# Patient Record
Sex: Female | Born: 1935 | Race: White | Hispanic: No | Marital: Married | Smoking: Never smoker
Health system: Southern US, Community
[De-identification: ages and names within clinical notes are randomized; demographics above are authoritative.]

## PROBLEM LIST (undated history)

## (undated) DIAGNOSIS — I739 Peripheral vascular disease, unspecified: Secondary | ICD-10-CM

## (undated) DIAGNOSIS — E1140 Type 2 diabetes mellitus with diabetic neuropathy, unspecified: Secondary | ICD-10-CM

## (undated) DIAGNOSIS — K219 Gastro-esophageal reflux disease without esophagitis: Secondary | ICD-10-CM

## (undated) DIAGNOSIS — I110 Hypertensive heart disease with heart failure: Secondary | ICD-10-CM

## (undated) DIAGNOSIS — E785 Hyperlipidemia, unspecified: Secondary | ICD-10-CM

## (undated) DIAGNOSIS — J189 Pneumonia, unspecified organism: Secondary | ICD-10-CM

## (undated) DIAGNOSIS — I4891 Unspecified atrial fibrillation: Secondary | ICD-10-CM

## (undated) DIAGNOSIS — Z95810 Presence of automatic (implantable) cardiac defibrillator: Secondary | ICD-10-CM

## (undated) DIAGNOSIS — J984 Other disorders of lung: Secondary | ICD-10-CM

## (undated) DIAGNOSIS — Z9289 Personal history of other medical treatment: Secondary | ICD-10-CM

## (undated) DIAGNOSIS — I472 Ventricular tachycardia: Secondary | ICD-10-CM

## (undated) DIAGNOSIS — I10 Essential (primary) hypertension: Secondary | ICD-10-CM

## (undated) DIAGNOSIS — I6521 Occlusion and stenosis of right carotid artery: Secondary | ICD-10-CM

## (undated) DIAGNOSIS — E119 Type 2 diabetes mellitus without complications: Secondary | ICD-10-CM

## (undated) DIAGNOSIS — M797 Fibromyalgia: Secondary | ICD-10-CM

## (undated) DIAGNOSIS — I701 Atherosclerosis of renal artery: Secondary | ICD-10-CM

## (undated) DIAGNOSIS — I447 Left bundle-branch block, unspecified: Secondary | ICD-10-CM

## (undated) DIAGNOSIS — I6529 Occlusion and stenosis of unspecified carotid artery: Secondary | ICD-10-CM

## (undated) DIAGNOSIS — N184 Chronic kidney disease, stage 4 (severe): Secondary | ICD-10-CM

## (undated) DIAGNOSIS — E237 Disorder of pituitary gland, unspecified: Secondary | ICD-10-CM

## (undated) DIAGNOSIS — I4819 Other persistent atrial fibrillation: Secondary | ICD-10-CM

## (undated) DIAGNOSIS — I509 Heart failure, unspecified: Secondary | ICD-10-CM

## (undated) DIAGNOSIS — F411 Generalized anxiety disorder: Secondary | ICD-10-CM

## (undated) DIAGNOSIS — E039 Hypothyroidism, unspecified: Secondary | ICD-10-CM

## (undated) DIAGNOSIS — Z973 Presence of spectacles and contact lenses: Secondary | ICD-10-CM

## (undated) DIAGNOSIS — G2581 Restless legs syndrome: Secondary | ICD-10-CM

## (undated) DIAGNOSIS — Z972 Presence of dental prosthetic device (complete) (partial): Secondary | ICD-10-CM

## (undated) DIAGNOSIS — M1990 Unspecified osteoarthritis, unspecified site: Secondary | ICD-10-CM

## (undated) DIAGNOSIS — I482 Chronic atrial fibrillation, unspecified: Secondary | ICD-10-CM

## (undated) DIAGNOSIS — N289 Disorder of kidney and ureter, unspecified: Secondary | ICD-10-CM

## (undated) DIAGNOSIS — K08109 Complete loss of teeth, unspecified cause, unspecified class: Secondary | ICD-10-CM

## (undated) DIAGNOSIS — D649 Anemia, unspecified: Secondary | ICD-10-CM

## (undated) DIAGNOSIS — E782 Mixed hyperlipidemia: Secondary | ICD-10-CM

## (undated) DIAGNOSIS — T462X5A Adverse effect of other antidysrhythmic drugs, initial encounter: Secondary | ICD-10-CM

## (undated) HISTORY — DX: Ventricular tachycardia: I47.2

## (undated) HISTORY — DX: Occlusion and stenosis of right carotid artery: I65.21

## (undated) HISTORY — DX: Chronic kidney disease, stage 4 (severe): N18.4

## (undated) HISTORY — PX: CATARACT EXTRACTION W/ INTRAOCULAR LENS  IMPLANT, BILATERAL: SHX1307

## (undated) HISTORY — PX: APPENDECTOMY: SHX54

## (undated) HISTORY — DX: Unspecified atrial fibrillation: I48.91

## (undated) HISTORY — PX: COLONOSCOPY W/ BIOPSIES AND POLYPECTOMY: SHX1376

## (undated) HISTORY — PX: ABDOMINAL HYSTERECTOMY: SHX81

## (undated) HISTORY — PX: OTHER SURGICAL HISTORY: SHX169

## (undated) HISTORY — DX: Generalized anxiety disorder: F41.1

## (undated) HISTORY — DX: Type 2 diabetes mellitus without complications: E11.9

## (undated) HISTORY — DX: Disorder of pituitary gland, unspecified: E23.7

## (undated) HISTORY — DX: Disorder of kidney and ureter, unspecified: N28.9

## (undated) HISTORY — DX: Occlusion and stenosis of unspecified carotid artery: I65.29

## (undated) HISTORY — DX: Chronic atrial fibrillation, unspecified: I48.20

## (undated) HISTORY — DX: Hyperlipidemia, unspecified: E78.5

## (undated) HISTORY — DX: Type 2 diabetes mellitus with diabetic neuropathy, unspecified: E11.40

## (undated) HISTORY — DX: Heart failure, unspecified: I50.9

## (undated) HISTORY — DX: Personal history of other medical treatment: Z92.89

## (undated) HISTORY — DX: Atherosclerosis of renal artery: I70.1

## (undated) HISTORY — DX: Peripheral vascular disease, unspecified: I73.9

## (undated) HISTORY — PX: CHOLECYSTECTOMY: SHX55

## (undated) HISTORY — DX: Adverse effect of other antidysrhythmic drugs, initial encounter: T46.2X5A

## (undated) HISTORY — DX: Left bundle-branch block, unspecified: I44.7

## (undated) HISTORY — PX: DILATION AND CURETTAGE OF UTERUS: SHX78

## (undated) HISTORY — DX: Other persistent atrial fibrillation: I48.19

## (undated) HISTORY — DX: Anemia, unspecified: D64.9

## (undated) HISTORY — DX: Other disorders of lung: J98.4

## (undated) HISTORY — DX: Mixed hyperlipidemia: E78.2

## (undated) HISTORY — DX: Presence of automatic (implantable) cardiac defibrillator: Z95.810

## (undated) HISTORY — DX: Hypertensive heart disease with heart failure: I11.0

## (undated) HISTORY — PX: BREAST SURGERY: SHX581

---

## 1992-10-09 HISTORY — PX: OTHER SURGICAL HISTORY: SHX169

## 2002-07-06 ENCOUNTER — Encounter: Payer: Self-pay | Admitting: *Deleted

## 2002-07-06 ENCOUNTER — Ambulatory Visit (HOSPITAL_COMMUNITY): Admission: RE | Admit: 2002-07-06 | Discharge: 2002-07-06 | Payer: Self-pay | Admitting: *Deleted

## 2005-07-21 ENCOUNTER — Ambulatory Visit (HOSPITAL_COMMUNITY): Admission: RE | Admit: 2005-07-21 | Discharge: 2005-07-21 | Payer: Self-pay | Admitting: Cardiovascular Disease

## 2005-07-21 HISTORY — PX: CARDIOVERSION: SHX1299

## 2006-04-17 ENCOUNTER — Encounter: Admission: RE | Admit: 2006-04-17 | Discharge: 2006-04-17 | Payer: Self-pay | Admitting: Cardiovascular Disease

## 2006-04-18 ENCOUNTER — Ambulatory Visit (HOSPITAL_COMMUNITY): Admission: RE | Admit: 2006-04-18 | Discharge: 2006-04-18 | Payer: Self-pay | Admitting: Cardiovascular Disease

## 2006-05-21 ENCOUNTER — Ambulatory Visit: Payer: Self-pay | Admitting: Internal Medicine

## 2006-05-29 ENCOUNTER — Inpatient Hospital Stay (HOSPITAL_COMMUNITY): Admission: AD | Admit: 2006-05-29 | Discharge: 2006-06-02 | Payer: Self-pay | Admitting: Internal Medicine

## 2006-05-29 ENCOUNTER — Ambulatory Visit: Payer: Self-pay | Admitting: Internal Medicine

## 2006-06-07 ENCOUNTER — Inpatient Hospital Stay (HOSPITAL_COMMUNITY): Admission: RE | Admit: 2006-06-07 | Discharge: 2006-06-09 | Payer: Self-pay | Admitting: Internal Medicine

## 2006-06-07 ENCOUNTER — Ambulatory Visit: Payer: Self-pay | Admitting: Internal Medicine

## 2006-06-08 HISTORY — PX: AV NODE ABLATION: SHX1209

## 2006-06-13 ENCOUNTER — Encounter: Payer: Self-pay | Admitting: Cardiology

## 2006-06-13 ENCOUNTER — Inpatient Hospital Stay (HOSPITAL_COMMUNITY): Admission: AD | Admit: 2006-06-13 | Discharge: 2006-06-18 | Payer: Self-pay | Admitting: Internal Medicine

## 2006-06-13 ENCOUNTER — Ambulatory Visit: Payer: Self-pay

## 2006-06-13 ENCOUNTER — Ambulatory Visit: Payer: Self-pay | Admitting: Internal Medicine

## 2006-06-27 ENCOUNTER — Ambulatory Visit: Payer: Self-pay | Admitting: Internal Medicine

## 2006-08-03 ENCOUNTER — Ambulatory Visit: Payer: Self-pay | Admitting: Internal Medicine

## 2006-09-19 ENCOUNTER — Ambulatory Visit: Payer: Self-pay | Admitting: Internal Medicine

## 2007-01-10 ENCOUNTER — Ambulatory Visit: Payer: Self-pay | Admitting: Internal Medicine

## 2009-01-18 ENCOUNTER — Encounter (INDEPENDENT_AMBULATORY_CARE_PROVIDER_SITE_OTHER): Payer: Self-pay

## 2009-05-31 ENCOUNTER — Encounter: Payer: Self-pay | Admitting: Internal Medicine

## 2009-10-09 HISTORY — PX: BRAIN MENINGIOMA EXCISION: SHX576

## 2010-06-09 DIAGNOSIS — I472 Ventricular tachycardia, unspecified: Secondary | ICD-10-CM

## 2010-06-09 HISTORY — DX: Ventricular tachycardia: I47.2

## 2010-06-09 HISTORY — DX: Ventricular tachycardia, unspecified: I47.20

## 2010-07-05 DIAGNOSIS — Z9289 Personal history of other medical treatment: Secondary | ICD-10-CM

## 2010-07-05 HISTORY — DX: Personal history of other medical treatment: Z92.89

## 2010-08-04 DIAGNOSIS — Z9289 Personal history of other medical treatment: Secondary | ICD-10-CM | POA: Insufficient documentation

## 2010-08-04 HISTORY — DX: Personal history of other medical treatment: Z92.89

## 2010-11-10 NOTE — Miscellaneous (Signed)
Summary: Device Preload  Clinical Lists Changes  Observations: Added new observation of PPM INDICATN: CHF (01/18/2009 16:39) Added new observation of PPMLEADSTAT2: active (01/18/2009 16:39) Added new observation of PPMLEADSER2: [id number] (01/18/2009 16:39) Added new observation of PPMLEADMOD2: [id number]  (01/18/2009 16:39) Added new observation of PPMLEADDOI2: 06/08/2006  (01/18/2009 16:39) Added new observation of PPMLEADLOC2: LV  (01/18/2009 16:39) Added new observation of PPMLEADSTAT1: active  (01/18/2009 16:39) Added new observation of PPMLEADSER1: [id number]  (01/18/2009 16:39) Added new observation of PPMLEADMOD1: [id number]  (01/18/2009 16:39) Added new observation of PPMLEADDOI1: 06/08/2006  (01/18/2009 16:39) Added new observation of PPMLEADLOC1: RV  (01/18/2009 16:39) Added new observation of PPM IMP MD: Cristopher Peru, MD  (01/18/2009 16:39) Added new observation of PPM DOI: 06/08/2006  (01/18/2009 16:39) Added new observation of PPM SERL#: [id number]  (01/18/2009 16:39) Added new observation of PPM MODL#: [id number]  (01/18/2009 16:39) Added new observation of PACEMAKERMFG: Medtronic  (01/18/2009 16:39) Added new observation of PACEMAKER MD: Cristopher Peru, MD  (01/18/2009 16:39)      PPM Specifications Following MD:  Cristopher Peru, MD     PPM Vendor:  Medtronic     PPM Model Number:  [id number]     PPM Serial Number:  [id number] PPM DOI:  06/08/2006     PPM Implanting MD:  Cristopher Peru, MD  Lead 1    Location: RV     DOI: 06/08/2006     Model #: [id number]     Serial #: [id number]     Status: active Lead 2    Location: LV     DOI: 06/08/2006     Model #: [id number]     Serial #: [id number]     Status: active   Indications:  CHF

## 2010-11-15 ENCOUNTER — Ambulatory Visit (HOSPITAL_COMMUNITY)
Admission: RE | Admit: 2010-11-15 | Discharge: 2010-11-15 | Disposition: A | Discharge status: Home / Self Care | Payer: MEDICARE | Source: Ambulatory Visit | Attending: Cardiovascular Disease | Admitting: Cardiovascular Disease

## 2010-11-15 ENCOUNTER — Ambulatory Visit (HOSPITAL_COMMUNITY): Payer: MEDICARE

## 2010-11-15 DIAGNOSIS — Z01812 Encounter for preprocedural laboratory examination: Secondary | ICD-10-CM | POA: Insufficient documentation

## 2010-11-15 DIAGNOSIS — Z4502 Encounter for adjustment and management of automatic implantable cardiac defibrillator: Secondary | ICD-10-CM | POA: Insufficient documentation

## 2010-11-15 DIAGNOSIS — Z01818 Encounter for other preprocedural examination: Secondary | ICD-10-CM | POA: Insufficient documentation

## 2010-11-15 HISTORY — PX: BIV ICD GENERTAOR CHANGE OUT: SHX5745

## 2010-11-15 LAB — GLUCOSE, CAPILLARY
Glucose-Capillary: 148 mg/dL — ABNORMAL HIGH (ref 70–99)
Glucose-Capillary: 120 mg/dL — ABNORMAL HIGH (ref 70–99)

## 2010-11-15 LAB — BASIC METABOLIC PANEL
BUN: 29 mg/dL — ABNORMAL HIGH (ref 6–23)
CO2: 28 mEq/L (ref 19–32)
Calcium: 9.8 mg/dL (ref 8.4–10.5)
Chloride: 103 mEq/L (ref 96–112)
Creatinine, Ser: 1.15 mg/dL (ref 0.4–1.2)
GFR calc Af Amer: 56 mL/min — ABNORMAL LOW (ref >60)
GFR calc non Af Amer: 46 mL/min — ABNORMAL LOW (ref >60)
Glucose, Bld: 139 mg/dL — ABNORMAL HIGH (ref 70–99)
Potassium: 4.1 mEq/L (ref 3.5–5.1)
Sodium: 142 mEq/L (ref 135–145)

## 2010-11-15 LAB — CBC
HCT: 35.6 % — ABNORMAL LOW (ref 36.0–46.0)
Hemoglobin: 11.7 g/dL — ABNORMAL LOW (ref 12.0–15.0)
MCH: 26.4 pg (ref 26.0–34.0)
MCHC: 32.9 g/dL (ref 30.0–36.0)
MCV: 80.2 fL (ref 78.0–100.0)
Platelets: 284 10*3/uL (ref 150–400)
RBC: 4.44 MIL/uL (ref 3.87–5.11)
RDW: 16.9 % — ABNORMAL HIGH (ref 11.5–15.5)
WBC: 7.7 10*3/uL (ref 4.0–10.5)

## 2010-11-15 LAB — SURGICAL PCR SCREEN
MRSA, PCR: NEGATIVE
Staphylococcus aureus: NEGATIVE

## 2010-11-15 LAB — PROTIME-INR
INR: 1.28 (ref 0.00–1.49)
Prothrombin Time: 16.2 seconds — ABNORMAL HIGH (ref 11.6–15.2)

## 2010-11-15 LAB — APTT: aPTT: 26 seconds (ref 24–37)

## 2010-11-24 NOTE — Op Note (Signed)
[name], [name]             ACCOUNT NO.:  [id number]  MEDICAL RECORD NO.:  [id number]           PATIENT TYPE:  O  LOCATION:  [facility]                         FACILITY:  [facility]  PHYSICIAN:  [name], MD     DATE OF BIRTH:  1934/01/02  DATE OF PROCEDURE: DATE OF DISCHARGE:                              OPERATIVE REPORT   PROCEDURE PERFORMED:  Biventricular defibrillator generator change out.  REASON FOR THE PROCEDURE:  Device at elective replacement interval.  PROCEDURE PERFORMED BY:  [name], MD.  MEDICATIONS ADMINISTERED:  Vancomycin 1 g intravenously, Versed 3 mg intravenously, fentanyl 50 mcg intravenously, and lidocaine 1% 30 mL locally.  ESTIMATED BLOOD LOSS:  Less than 5 mL.  COMPLICATIONS:  None.  PROCEDURE IN DETAIL:  After risks and benefits of the procedure were described, the patient provided informed consent.  She was brought to the cardiac cath lab in a fasting state, and the left shoulder area was prepped and draped in the usual sterile fashion.  Local anesthesia of 1% lidocaine was administered to the prepectoral infraclavicular area, and a 7-cm horizontal incision was made parallel to the inferior border of the clavicle in the area of the previous surgical scar.  Using limited electrocautery and careful blunt dissection, the device pocket was exposed.  Great care was taken to avoid injury to the chronic leads.  The chronic device was explanted without difficulty.  The leads were individually tested, and electronic parameters were found to be within desirable range and unchanged from preprocedural findings.  The pocket was flushed with copious amounts of antibiotic solution and inspected for hemostasis, which was found to be excellent.  The new generator was then attached to the chronic leads, and appropriate biventricular pacing was noted.  The generator was placed in the pocket with great care being taken to ensure that the leads were located  deep to the generator.  A single silk suture was placed to secure the device and prevent migration.  The pocket was then closed in layers using two layers of 2-0 Vicryl and one layer of 4-0 Vicryl.  Steri-Strips were applied, and then a sterile pressure dressing was placed.  No complications occurred.  The following data is available for the chronic leads:  The right atrial lead is a Medtronic 5076/53 cm, serial number [id number], inserted on June 17, 2007.  Right ventricular lead is a [employer] 7121/65 cm, serial number [id number], inserted on June 17, 2007.  Left ventricular lead is a Medtronic 4194/88 cm, serial number [id number], inserted on June 17, 2007.  The explanted device is a Medtronic Concerta 0000000.  The new device is a Medtronic Protecta [id number], serial number [id number].  The following lead parameters were encountered via the device at the end of the procedure:  Right atrial sensed P-wave 0.5 mV (patient in atrial fibrillation), impedance 456 ohms.  Capture could not be tested.  Right ventricular lead sensed R-wave 7.9 mV, impedance 494 ohms, threshold 1.2 volts at 0.4 milliseconds pulse width.  Left ventricular lead, impedance 513 ohms, threshold 2.5 volts at 1.0 milliseconds pulse width.  The high-voltage impedance is 47 ohms (57  ohms for the SVC coil).  The device was programmed identical to the previous device with a VF zone at 194 beats per minute and a VT monitor zone at 150 beats per minute.  The radioprogramming is VVIR with a lower rate limit of 17 and upper sensor threshold of 120 beats per minute.     [name], MD     MC/MEDQ  D:  11/15/2010  T:  11/16/2010  Job:  [id number]  cc:   Delfino Lovett A. Rollene Fare, M.D.  Electronically Signed by [name] M.D. on 11/24/2010 01:10:43 PM

## 2010-12-23 DIAGNOSIS — I459 Conduction disorder, unspecified: Secondary | ICD-10-CM

## 2010-12-23 DIAGNOSIS — Z95810 Presence of automatic (implantable) cardiac defibrillator: Secondary | ICD-10-CM

## 2010-12-23 DIAGNOSIS — I429 Cardiomyopathy, unspecified: Secondary | ICD-10-CM

## 2010-12-23 DIAGNOSIS — I442 Atrioventricular block, complete: Secondary | ICD-10-CM | POA: Insufficient documentation

## 2010-12-23 HISTORY — DX: Presence of automatic (implantable) cardiac defibrillator: Z95.810

## 2010-12-23 HISTORY — DX: Cardiomyopathy, unspecified: I42.9

## 2010-12-23 HISTORY — DX: Conduction disorder, unspecified: I45.9

## 2010-12-23 HISTORY — DX: Atrioventricular block, complete: I44.2

## 2010-12-26 ENCOUNTER — Encounter: Payer: Self-pay | Admitting: Internal Medicine

## 2010-12-26 ENCOUNTER — Institutional Professional Consult (permissible substitution) (INDEPENDENT_AMBULATORY_CARE_PROVIDER_SITE_OTHER): Payer: MEDICARE | Admitting: Internal Medicine

## 2010-12-26 DIAGNOSIS — I5022 Chronic systolic (congestive) heart failure: Secondary | ICD-10-CM

## 2010-12-26 DIAGNOSIS — I4891 Unspecified atrial fibrillation: Secondary | ICD-10-CM | POA: Insufficient documentation

## 2010-12-26 DIAGNOSIS — Z95810 Presence of automatic (implantable) cardiac defibrillator: Secondary | ICD-10-CM

## 2010-12-26 DIAGNOSIS — I4901 Ventricular fibrillation: Secondary | ICD-10-CM

## 2010-12-26 DIAGNOSIS — I469 Cardiac arrest, cause unspecified: Secondary | ICD-10-CM

## 2010-12-26 HISTORY — DX: Chronic systolic (congestive) heart failure: I50.22

## 2010-12-26 HISTORY — DX: Ventricular fibrillation: I49.01

## 2010-12-26 HISTORY — DX: Presence of automatic (implantable) cardiac defibrillator: Z95.810

## 2011-01-02 ENCOUNTER — Encounter: Payer: Self-pay | Admitting: Internal Medicine

## 2011-01-05 NOTE — Assessment & Plan Note (Signed)
Summary: [name]/polymorphic vt/has biv icd/seh&v [id number] medicare pt...   CC:  new patient/pt states Metoprolol 50 makes her nauseated and has not taken today.  History of Present Illness: [name] returns today for eval of PMVT/VF in the setting of CHF.  The patient has had a long h/o atrial fib which is chronic. She failed multiple medications in the past for atrial fib including amiodarone, sotalol and tikosyn.  She underwent AV node ablation and BiV PPM implant and had normalization of LV function and improvement from class 3 to class 1 with regard to her CHF. After her Biv implant, she had occaisinal diaphragmatic stimulation which she tolerated but then apparently had an increase of her LV threshold and had her lead revised and her system changed to a BiV ICD. She had this device changed out several weeks ago by Dr. Orene Desanctis. She was in her usual state of health when she had a brief syncopal episode on March 3 and on device reinterogation was found to have had several episodes of PMVT/VF of which all but one self terminated. She did not feel an ICD shock. She had a stress test by Dr. Rollene Fare several months ago which demonstrated no ischemia or scar and normalization of her LV function. Prior echo demonstrated an EF of 45%. She denies anginal symptoms or CHF symptoms. No change in her diet or medications and she is on 25 mg of metoprolol twice daily. Attempts to uptitrate this resulted in nausea.  Current Medications (verified): 1)  Digoxin 0.125 Mg Tabs (Digoxin) .... Take 1/2 Tablet Once Daily 2)  Losartan Potassium 50 Mg Tabs (Losartan Potassium) .... Once Daily 3)  Zetia 10 Mg Tabs (Ezetimibe) .... Take One Tablet By Mouth Daily. 4)  Metoprolol Tartrate 50 Mg Tabs (Metoprolol Tartrate) .... Take One Tablet By Mouth Twice A Day 5)  Levothyroxine Sodium 50 Mcg Tabs (Levothyroxine Sodium) .... Once Daily 6)  Furosemide 40 Mg Tabs (Furosemide) .... Take One Tablet By Mouth Daily. 7)   Jantoven 5 Mg Tabs (Warfarin Sodium) .... As Needed 8)  Hemocyte 324 Mg Tabs (Ferrous Fumarate) .... Once Daily 9)  Pantoprazole Sodium 40 Mg Tbec (Pantoprazole Sodium) .... Once Daily 10)  Aspirin 81 Mg Tbec (Aspirin) .... Take One Tablet By Mouth Daily 11)  Tramadol Hcl 50 Mg Tabs (Tramadol Hcl) .... As Needed 12)  Percocet 5-325 Mg Tabs (Oxycodone-Acetaminophen) .... As Needed 13)  Hydroxyzine Hcl 25 Mg Tabs (Hydroxyzine Hcl) .... As Needed 14)  Metformin Hcl 1000 Mg Tabs (Metformin Hcl) .... Bid 15)  Glipizide 5 Mg Tabs (Glipizide) .... Two Times A Day 16)  Levemir 100 Unit/ml Soln (Insulin Detemir) .... Uad 17)  Promethazine Hcl 25 Mg Tabs (Promethazine Hcl) .... As Needed  Allergies (verified): 1)  ! * Pcn 2)  ! * Lisinopril 3)  ! Morphine  Past History:  Family History: Last updated: 12/26/2010 No premature CAD  Social History: Last updated: 12/26/2010 Denies tobacco or ETOH  Past Medical History: Current Problems:  PACEMAKER, PERMANENT (ICD-V45.01) ATRIOVENTRICULAR BLOCK, 3RD DEGREE (ICD-426.0) AV BLOCK (ICD-426.9) CARDIOMYOPATHY (ICD-425.4)  s/p Cholecystectomy with multiple stents  Past Surgical History: s/p PPM and ICD  Family History: No premature CAD  Social History: Denies tobacco or ETOH  Review of Systems       All systems reviewed and negative except as noted in the HPI.   Vital Signs:  Patient profile:   77 year old female Height:      68 inches Weight:  197 pounds BMI:     30.06 Pulse rate:   59 / minute Pulse rhythm:   regular BP sitting:   158 / 80  (left arm) Cuff size:   large  Vitals Entered By: Doug Sou CMA (December 26, 2010 12:32 PM)  Physical Exam  General:  Well developed, well nourished, in no acute distress.  HEENT: normal Neck: supple. No JVD. Carotids 2+ bilaterally no bruits Cor: RRR no rubs, gallops or murmur Lungs: CTA. Well healed ICD incision. Ab: soft, nontender. nondistended. No HSM. Good bowel  sounds Ext: warm. no cyanosis, clubbing or edema Neuro: alert and oriented. Grossly nonfocal. affect pleasant    EKG  Procedure date:  12/26/2010  Findings:      Atrial fibrillation with a controlled ventricular response rate of: 73   PPM Specifications Following MD:  Cristopher Peru, MD     PPM Vendor:  Medtronic     PPM Model Number:  [id number]     PPM Serial Number:  [id number] PPM DOI:  06/08/2006     PPM Implanting MD:  Cristopher Peru, MD  Lead 1    Location: RV     DOI: 06/08/2006     Model #: [id number]     Serial #: [id number]     Status: active Lead 2    Location: LV     DOI: 06/08/2006     Model #: [id number]     Serial #[id number]     Status: active   Indications:  CHF   Impression & Recommendations:  Problem # 1:  AUTOMATIC IMPLANTABLE CARDIAC DEFIBRILLATOR SITU (ICD-V45.02) Assessment New Her device is working normally. Will recheck in several months.  Problem # 2:  VENTRICULAR FIBRILLATION (ICD-427.41) This is a  new problem and resulted in one ICD shock despite her relatively preserved LV function. I have reivewed her prior meds and she is not tolerant to anti-arrhythmic medical therapy and I have recommended that we continue her beta blocker for now. If she has recurrent episodes than I would suggest a left heart cath. Her updated medication list for this problem includes:    Metoprolol Tartrate 50 Mg Tabs (Metoprolol tartrate) .Marland Kitchen... Take one tablet by mouth twice a day    Jantoven 5 Mg Tabs (Warfarin sodium) .Marland Kitchen... As needed    Aspirin 81 Mg Tbec (Aspirin) .Marland Kitchen... Take one tablet by mouth daily  Problem # 3:  CHRONIC DIASTOLIC HEART FAILURE (0000000) Her symptoms appear to be well controlled. Wil continue her current meds and a low sodium diet. Her updated medication list for this problem includes:    Digoxin 0.125 Mg Tabs (Digoxin) .Marland Kitchen... Take 1/2 tablet once daily    Losartan Potassium 50 Mg Tabs (Losartan potassium) ..... Once daily    Metoprolol Tartrate 50 Mg Tabs  (Metoprolol tartrate) .Marland Kitchen... Take one tablet by mouth twice a day    Furosemide 40 Mg Tabs (Furosemide) .Marland Kitchen... Take one tablet by mouth daily.    Jantoven 5 Mg Tabs (Warfarin sodium) .Marland Kitchen... As needed    Aspirin 81 Mg Tbec (Aspirin) .Marland Kitchen... Take one tablet by mouth daily  Patient Instructions: 1)  Your physician wants you to follow-up in:4 months with Dr Knox Saliva will receive a reminder letter in the mail two months in advance. If you don't receive a letter, please call our office to schedule the follow-up appointment. 2)  Your physician recommends that you continue on your current medications as directed. Please refer to the Current Medication list given  to you today.

## 2011-02-21 NOTE — Letter (Signed)
January 10, 2007    Richard A. Rollene Fare, M.D.  [phone] [address]. [address], [place], [place] [address]   RE:  ROSETTER, LOCH  MRN:  [id number]  /  DOB:  1934-08-20   Dear Denice Paradise:   Today, we saw Pamala Hurry back for EP followup.  As you know, she is a very  pleasant lady with a history of chronic atrial fibrillation and a  tachycardia-induced cardiomyopathy, who is status post AV node ablation  and BiV pacemaker insertion.  She has done very well with this and her  diaphragmatic stimulation, which was initially quite a problem, has  improved markedly.  Her heart failure symptoms are nearly abated.  Today, we interrogated her device in our office, and her thresholds have  been stable with an LV threshold of 2.45 with her output set at 2.9.  Her impedances were stable as well.  Her underlying low rate is set at  70.  Her rate response is left on.  Overall, the patient is stable and I  have recommended that she follow up with me only on an as needed basis.  Thanks for allowing me to help with Ms. Turrell.  Please do not hesitate  to contact me for additional issues with regard to her care.    Sincerely,      Champ Mungo. Lovena Le, MD  Electronically Signed    GWT/MedQ  DD: 01/10/2007  DT: 01/10/2007  Job #: [id number]

## 2011-02-21 NOTE — Assessment & Plan Note (Signed)
[facility] OFFICE NOTE   Sandy, Hunt                    MRN:          [id number]  DATE:01/10/2007                            DOB:          1934/02/29    [name] returns today for followup.  She is a very pleasant elderly  women with non-ischemic cardiomyopathy, congestive heart failure,  chronic atrial fibrillation, status post AV node ablation and bi-v  pacemaker insertion.  Since her initial procedures, her heart failure  has improved dramatically, and she is now approaching class 1.  The  patient denies chest pain.  She can do essentially whatever she wants.  Previously, she had had trouble with diaphragmatic stimulation only when  she moved to her left side.  She has less of this now, though still  feels it occasionally.   EXAMINATION:  GENERAL:  She is a pleasant, well-appearing woman in no  distress.  VITAL SIGNS:  The blood pressure was 112/74.  The pulse was 80 and  regular.  Respirations were 18.  NECK:  Revealed no jugular venous distention.  LUNGS:  Clear bilaterally to auscultation.  CARDIOVASCULAR:  Revealed a regular rate and rhythm with normal S1 and  S2.  LUNGS:  Clear bilaterally to auscultation.  No wheezes, rales or  rhonchi.  EXTREMITIES:  Demonstrated no edema.   EKG demonstrates atrial fibrillation with ventricular pacing.   Interrogation of her pacemaker demonstrates a Medtronic Insync with R-  waves of greater than 22, impedence 646 in the RV, 804 in the LV,  threshold 0.5 and 0.4 in the RV, 2 at 0.45 in the LV or 1-1/2 at 0.9 in  the LV.  Her output today remained programmed at 2 at 0.9.  This was LV  tip to RV ring.  She was 99% V paced.   IMPRESSION:  1. Non-ischemic cardiomyopathy with initial EF after pacemaker      insertion of 20%; however, her heart failure is markedly improved,      and I expect her EF  will be improved.  The patient states to me  that she has been scheduled for a 2D echo with Dr. Rollene Fare in the      next several weeks.  2. Complete heart block secondary to AV node ablation.   DISCUSSION:  Overall, [name] is stable.  Because of this, I have  recommended that she continue to follow up with Dr. Rollene Fare, who is  her primary cardiologist.  I will be available to see the patient on an  as-needed basis.    Champ Mungo. Lovena Le, MD  Electronically Signed   GWT/MedQ  DD: 01/10/2007  DT: 01/10/2007  Job #: [id number]   cc:   Delfino Lovett A. Rollene Fare, M.D.

## 2011-02-24 NOTE — Letter (Signed)
May 21, 2006     Richard A. Rollene Fare, M.D.  [phone] [address]. [address] [place], [place]  [address]   RE:  [name], [name]  MRN:  [id number]  /  DOB:  1933/10/18   Dear Denice Paradise:   Thank you for referring Ms. Jeanene Erb for EP evaluation.  As you know  she is a very pleasant 77 year old woman with a history of tachy-brady  syndrome and chronic left bundle branch block who has recently developed  worsening atrial fibrillation and with rapid ventricular response.  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  For all of the above the amiodarone was discontinued.  She  returns today for followup.  The patient states that when she walks across  the room she immediately gets dyspneic and has to stop and rest.  Her  symptoms are fairly severe.  She denies chest pain.  Her exam is notable for  findings consistent with congestive heart failure in that she has mild  rales, a large PMI which is latter displaced, and a split S2.  Her  extremities demonstrated very little in the way of edema.  Her EKG  demonstrates atrial fibrillation with left bundle branch block and a QRS  duration of 164 msec.  Her TT interval is somewhat prolonged, but this is in  the setting of left bundle branch block.  By report her kidney function is  normal.   I have discussed treatment options with the patient.  These would include  checking her amiodarone level and if it is low enough, initiation of Tikosyn  in the hospital.  The second option would be AV node ablation and a bivalve  ICD implantation.  The third option would be primary atrial fibrillation  ablation.  I suppose there is a correlary  holding off on AV node ablation  and just put in  a bivalve ICD to up titration of her rate control and drugs  with Coreg would be an option to #2.  With all of the above, I recommended  that we try initially to start her on Tikosyn and will plan to do this if  her amiodarone level makes Tikosyn therapy reasonable.  This will be set up  in the next several weeks.  I will keep you apprised of how Ms. Purdy  does.  Also may I note that she has fairly significant sinus node  dysfunction and with her LV dysfunction and sinus node dysfunction even if  we can maintain her in sinus rhythm, she may well require backup pacing in  the setting of LV dysfunction and left bundle branch block and require a  defibrillator.  Thanks again for referring Ms. Steffanie Dunn for EP evaluation.    Sincerely,      Champ Mungo. Lovena Le, MD   GWT/MedQ  DD:  05/21/2006  DT:  05/21/2006  Job #:  [id number]   CC:    Dewitt Hoes

## 2011-02-24 NOTE — Op Note (Signed)
NAMEDOLL, MISE             ACCOUNT NO.:  [id number]   MEDICAL RECORD NO.:  [id number]          PATIENT TYPE:  INP   LOCATION:  3705                         FACILITY:  [facility]   PHYSICIAN:  Champ Mungo. Lovena Le, MD    DATE OF BIRTH:  1934-08-23   DATE OF PROCEDURE:  06/08/2006  DATE OF DISCHARGE:                                 OPERATIVE REPORT   PROCEDURE PERFORMED:  AV node ablation.   INDICATION:  Symptomatic atrial fibrillation with an uncontrolled rapid  ventricular response resulting in heart failure.   INTRODUCTION:  The patient is a 77 year old woman with a history of multiple  medical problems, who has permanent atrial fibrillation which could not be  cardioverted successfully.  She has had a rapid ventricular response despite  AV nodal blocking drugs and is now referred for AV node ablation after  undergoing biventricular pacemaker insertion the day before.   PROCEDURE:  After informed consent was obtained, the patient was taken to  the diagnostic EP lab in a fasting state.  After the usual preparation and  draping, intravenous fentanyl and midazolam were given for sedation.  A 7-  French quadripolar ablation catheter was inserted percutaneously into the  right femoral vein and advanced to the His bundle region.  Mapping was  carried out.  Four RF energy applications were delivered to this region,  taking care not to entangle the previously-placed LV and RV pacing leads.  Complete heart block was also created with a ventricular escape at 42 beats  per minute.  The patient was observed for 15 minutes and had no recurrent AV  node conduction, and the catheter was removed.  Hemostasis was assured and  she was returned to her room in satisfactory condition.  The patient's  pacemaker was reprogrammed to a lower rate of 90 beats per minute, which  will be done transiently and gradually decreased over time.  She tolerated  the procedure well.   COMPLICATIONS:  There were no  immediate procedure complications.   RESULTS:  This demonstrates successful AV node ablation in a patient with a  prior biventricular biopsy pacemaker.           ______________________________  Champ Mungo. Lovena Le, MD     GWT/MEDQ  D:  06/08/2006  T:  06/08/2006  Job:  [id number]   cc:   Delfino Lovett A. Rollene Fare, M.D.

## 2011-02-24 NOTE — Discharge Summary (Signed)
[name], [name]             ACCOUNT NO.:  [id number]   MEDICAL RECORD NO.:  [id number]          PATIENT TYPE:  INP   LOCATION:  [id number]                         FACILITY:  [place]   PHYSICIAN:  Deboraha Sprang, MD, FACCDATE OF BIRTH:  [birth year]   DATE OF ADMISSION:  06/13/2006  DATE OF DISCHARGE:  06/18/2006                                 DISCHARGE SUMMARY   She has allergy to PENICILLIN.   The patient is discharging with intermittent diaphragmatic stimulation. She  will call our office at [facility] if this becomes more pronounced  or bothersome.   PRINCIPAL DIAGNOSES:  1. Admitted September 5 in class IV decompensated congestive heart      failure.  (A)  Admission weight 192, discharge weight 191.8.  (B)  Congestive heart failure exacerbated by atrial fibrillation and rapid  ventricular rate.  (C)  Medical therapy adjusted with addition of Coreg and discontinuing  Toprol.  1. Atrial fibrillation, permanent/Coumadin therapy.  (A)  Symptoms are fatigue/dyspnea on exertion/mellitus/anorexia/  dizziness/palpitation.  (B)  The patient has failed amiodarone with decreased DLCO/pulmonary  toxicity.  (C)  Failed Tikosyn. Unable to maintain sinus rhythm after DC cardioversion  x2.  (D)  Implantation of Medtronic InSync III model [id number] CRT-P August 29.  (E)  AV node ablation June 07, 2006. The patient is pacemaker dependent.  1. DEVICE this admission.  (A)  Left ventricular threshold with failure to capture on admission.  (B)  LV reprogrammed I June 14, 2006 to 5.0 volts at 0.4 msec.  (C)  Chest x-ray shows no evidence of left ventricular lead migration  June 13, 2006.  (D)  Diaphragmatic stimulation hospital day #3.  (E)  Reprogramming II left ventricular lead tip to RV ring 2.5 volts at 1.0  milliseconds.  (F)  Reprogram III September 10 to gain LV capture 3.5 volts at 1.4 msec.  The patient's diaphragmatic stimulation therapy is 3.0 volts the 0.5 msec.  (G)   The patient is discharging with intermittent diaphragmatic stimulation  which was more evident when the patient is supine.  1. The patient feels much better the last 72 hours.   SECONDARY DIAGNOSES:  1. Normal left ventricular function with diastolic dysfunction.  2. Chronic renal insufficiency.  3. Left bundle branch block.  4. Dyslipidemia.  5. Nonischemic Myoview study January 2006.  6. Treated hypothyroidism.   PROCEDURES THIS ADMISSION:  As mentioned above reprogramming I through IV to  optimize left ventricular pacing augmentation.   BRIEF HISTORY:  [name] is a 77 year old female who has a history of  congestive heart failure with diastolic dysfunction.  Her class congestive  heart failure is rated at III but this is exacerbation by atrial  fibrillation and rapid ventricular response.   She was admitted through the office to [facility] with  decompensation of class III to class IV congestive heart failure in late  August. She was discharged August 31 with a hospital visit aimed primarily  at controlling atrial fibrillation. When she has atrial fibrillation and  rapid ventricular rate, she has fatigue, dyspnea on exertion, malaise,  anorexia,  dyspnea, dizziness and palpitations. She was tried on  antiarrhythmics. She was tried on amiodarone but she had a decreased DLCO  and pulmonary toxicity. Her amiodarone was discontinued.   She was admitted in late August for Tikosyn therapy.  She loaded Tikosyn for  48 hours. She failed to convert spontaneously so she underwent direct  current cardioversion x2.  This also failed to convert to a sinus rhythm.  Because of this, she underwent implantation of Medtronic InSync III  CRT  pacemaker on August 29. The day following which she underwent AV node  ablation.  The patient is now pacemaker dependent.   The patient was discharged August 31 but she says that since that time, she  has basically been unable to sleep. She has  rough nights, she gets fatigued  just walking across the room.  She has intermittent dizziness when she  exerts herself.  She has orthopnea and anorexia. At first she thought that  her symptoms would improved and that it  would take a little while for the  by the BiV pacing feature to show results. Instead she suddenly got worse.  She called the office on September 4 and she was told to come in for an  office visit September 5.   In the office, it was found that she had a high LV threshhold with  subsequent failure to capture. Her pacemaker was reprogrammed LV output at 5  volts and 0.4 milliseconds.   In the past, she has had an echocardiogram which showed preserved left  ventricular function, moderate mitral regurgitation.  A Myoview study  January 2006 was negative for ischemia   The patient also has comorbidities which include the risk equivalent of  diabetes.  She has mild renal insufficiency.  She has left bundle branch  block and moderate pulmonary hypertension. Because she seems in  decompensated the congestive heart failure, she was transferred to [facility] for IV diuresis and possible dobutamine therapy.   HOSPITAL COURSE:  The patient was admitted from the office of [place] Wantagh in decompensated class IV  congestive heart failure.  She had been unable to breathe well since  discharge on August 31 and is suffering from paroxysmal nocturnal dyspnea  and orthopnea.  Her BNP on admission was 618, her first I study was 0.05.  She was started on IV Lasix 40 mg IV q.12 h.  Her anticipated dobutamine  therapy was held and has not been used this admission. Her Toprol was  stopped and she was started on Coreg on September 7 hospital day number 3.  On September 6, the first of several reprogramming trials for her pacemaker were begun. The device was reprogrammed to 5 volts at 0.4 milliseconds. By  September 7 hospital day #3, the  patient started feeling better but she felt  that her heart was jumping all night and did not sleep well.  She was  however feeling less short of breath. Interrogation of the pacemaker showed  that the patient was having diaphragmatic stimulation. The left ventricular  threshold was measured at 2.5 volts at 0.6 msec. Her diaphragmatic  stimulation threshold was 3.0 volts at 0.5 msec. By hospital day #4, the  patient continued to improve her admission BNP which was 618 and had gone  down to 465. By hospital day #5, it was thought that the patient could  discharge on September 10. Examination of the telemetry on September 10  shows  that there was only intermittent left ventricular capture. The device  was interrogated one more time. The LV lead was reprogrammed at 3.5 volts at  1.4 milliseconds. This captured the left ventricle consistently and the  patient did not experience diaphragmatic stimulation. The LV capture  threshold is 3.5 volts at 1.0 msec or 3.0 volts of 1.4 msec. However, 2  hours later the patient was reporting intermittent diaphragmatic  stimulation.  She did not have this when she stood up or sat up but it was  present when she was supine. Dr. Caryl Comes reexamined her chest x-rays which  showed that the left ventricular lead had not significantly migrated. He  discussed with the patient the problems of diaphragmatic stimulation and  with the consideration that it is intermittent in nature felt that the  patient could discharge September 10 with early follow-up with Dr. Lovena Le  the week of September 17. She would of course be able to call the office if  her symptoms increase or demand a more urgent follow-up.   PLAN:  1. The patient discharged on a low-sodium, low-cholesterol diabetic diet      with a 1200 mL fluid restriction.  2. She is to weigh herself daily and to call the office of [facility] if her weight increases by 2-3 pounds in 2 days. She has office       visit early follow-up with Dr. Lovena Le Wednesday September 19 at 9:30.   DISCHARGE MEDICATIONS:  1. Coreg 3.125 mg twice daily.  This is a new medication.  She is to hold      the Toprol that she had been taking.  2. Digoxin 0.062 mg daily.  3. Lisinopril 10 mg daily.  4. Glipizide 5 mg twice daily.  5. Levothyroxine 15 mcg daily.  6. Lasix 40 mg twice daily.  This is a new dose.  7. Potassium chloride 20 mEq twice daily.  8. Enteric-coated aspirin 81 mg daily.  9. Zetia 10 mg daily.  10.Coumadin 5 mg to alternate with 2.5 mg every other day.  11.Xanax 0.25 mg as needed.  12.Hydrocodone as needed.   LABORATORY STUDIES:  As admission before. The patient's weight at discharge  is 191.8 pounds. The complete blood count on September 8, hemoglobin 13.6,  hematocrit 41.5, white cells 7.1, platelets 285.  Serum electrolytes on the day of discharge, sodium 140, potassium 4.1, chloride 103, carbonate 27, BUN  is 26, creatinine 1.5 and glucose is 89. The PT/INR on the day of discharge  September 10.  PT is 20.7, INR 1.7.  She is encouraged to take 5 mg of  Coumadin today. This admission her liver function studies SGOT was 65, SGPT  was 47 and alkaline phosphatase 107. Troponin I studies this admission 0.05  then 0.04 then 0.05.  Dictation 45 minutes.     ______________________________  Sueanne Margarita, PA    ______________________________  Deboraha Sprang, MD, [facility]    GM/MEDQ  D:  06/18/2006  T:  06/19/2006  Job:  [id number]   cc:   Champ Mungo. Lovena Le, [place] A. Rollene Fare, M.D.

## 2011-02-24 NOTE — Assessment & Plan Note (Signed)
[place] OFFICE NOTE   [name], [name]                    MRN:          [id number]  DATE:06/27/2006                            DOB:          1933/11/09    [name] returns today for followup.  She is a very pleasant woman with a  history of persistent, uncontrolled atrial fibrillation with a rapid  ventricular response who had failed multiple antiarrhythmic drugs including  amiodarone as well as Tikosyn who was admitted to the hospital several weeks  ago and underwent AV node ablation and Bi-V pacer implant.  The patient had  previously had preserved LV function; however, her EF had dropped somewhat.  She was discharged home but then subsequently represented back with  congestive heart failure with marked diminution of her LV function.  She was  subsequently found to have an increase in her LV pacing threshold and was  having intermittent diaphragmatic stimulation.  She was ultimately diuresed  and discharged home and returns today for followup.  The patient feels  better.  She does note intermittent diaphragmatic stimulation, particularly  when she lays on her left side.  If she sits a certain way, she will also  have diaphragmatic stimulation.  Fortunately, the patient's dyspnea has  improved markedly and she has gone from not being able to walk across the  room to being able to walk across or around her house without particular  limitation and she is doing this several times a day now.  Her peripheral  edema is also much improved, if not resolved altogether.  She denies chest  pain.   EXAMINATION:  She is a pleasant, elderly appearing, no acute distress.  The blood pressure today was 90/60.  The pulse was 90 and regular.  The  respirations were 18.  The neck revealed no jugular venous distention.  LUNGS:  Clear bilaterally to auscultation.  There are no wheezes, rales, or  rhonchi.  CARDIOVASCULAR:  Revealed a regular rate and rhythm with normal S1 and S2.  EXTREMITIES:  Demonstrated no edema.   Interrogation of her pacemaker demonstrates a Medtronic InSync with RV waves  with greater than 22 mV.  Pacing impedence in the right ventricle was 691  and a pacing threshold 0.5 at 0.4 in the right ventricle.  Her LV pacing  threshold was 2.5 V at 1 msec and 2 V at 1.4 msec.  Diaphragmatic  stimulation went away at 1.5 V at 1.4 msec.  Of note that her diaphragmatic  stimulation was only present when she lied flat or on her left side.   IMPRESSION:  1. 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  2. Left bundle branch block.  3. Atrial fibrillation with a rapid ventricular response.  4. Status post atrioventricular node ablation and Bi-V pacemaker insertion      with the procedure complicated by diaphragmatic stimulation and      increase in the patient's threshold without movement of the left      ventricular lead.   DISCUSSION:  Today, [name] is overall  improved with maintenance of LV  and RV pacing with a control of her ventricular rate.  She does have  intermittent diaphragmatic stimulation which she is able to tolerate  relatively well.  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  I  have discussed several options with the patient.  First, would be to re-  operatee and try to reposition the lead; however, my operative note notes  that her lateral vein was somewhat difficult to access and deploy the lead  through.  We could try withdrawing the LV lead slightly which might reduce  the likelihood of diaphragmatic stimulation.  Second option would be to turn  off her LV lead altogether but, unfortunately, she would have, at that  point, fairly significant dyssynchrony with RV septal pacing and would not  like to do this at the present time.  Another option would be to continue  her as she presently is with intermittent  diaphragmatic stimulation but LV  capture and then because she is improved and because there is still a chance  that her pacing threshold in the left ventricle will go down in the next 2  months, I have recommended that we continue in this track.  I will plan to  see her back in the office in 2 months to see how the patient is doing.  She  is instructed to maintain a low-salt diet.  The patient does note some  dizziness and this has been particularly present since beginning lisinopril  and, for this reason, I have asked her to decrease her dose from 10 to 5 mg  daily.  She will continue low-dose Coreg, digoxin, potassium supplements,  and Lasix.  It may be that we can also may be decrease her Lasix dose down  to 40 daily instead of twice a day.  She is scheduled to see Dr. Terance Ice who is her primary cardiologist in followup as well.                                   Champ Mungo. Lovena Le, MD   GWT/MedQ  DD:  06/27/2006  DT:  06/28/2006  Job #:  [id number]   cc:   Delfino Lovett A. Rollene Fare, M.D.  [facility]es

## 2011-02-24 NOTE — Assessment & Plan Note (Signed)
[place] OFFICE NOTE   [name]                    MRN:          [id number]  DATE:05/21/2006                            DOB:          16-Apr-1934    [name] is referred today by Dr. Terance Ice for evaluation of  atrial fibrillation and tachy-brady syndrome.   HISTORY OF PRESENT ILLNESS:  Patient is a very pleasant 77 year old woman  with a history of cardiomyopathy of unclear etiology.  She has no  obstructive coronary disease.  Back in March in 2006, underwent stress  testing which demonstrated preserved LV function and no obvious ischemia.  She does have chronic left bundle branch block.  The patient has had tachy-  brady syndrome for many years but has recently developed more and more A fib  and persistent, if not chronic, A fib.  The patient has been tried on  Betapace, which was unsuccessful, and was initially started on amiodarone 2-  3 months ago.  She took the medication for a month but then developed  worsening pulmonary function with a reduction in her diffusion capacity.  It  is unclear whether this was due to acute amiodarone use or underlying  chronic lung disease, but the amiodarone was discontinued.  In addition, the  patient developed thyroid dysfunction on amiodarone.  She returns for  followup.  The patient notes that when she does any activity, including  walking across one room to another in her house, she becomes dyspneic and  fatigued.  She denies chest pain.  She has a remote history of syncope as a  young woman but still has a rare episode once every five years or so.  She  denies PND or orthopnea.   Her additional past medical history was notable for sinus bradycardia.  In  sinus rhythm, she has typically heart rates in the high 40s and low 50s.  She has at least moderate pulmonary hypertension by 2D echo.   The patient's medications include digoxin,  furosemide, Jantoven, aspirin,  glipizide, Zetia.  She was previously on Betapace but none now.   Her family history was notable for a mother deceased at age 55.  She  does  not have any information on her father.  She has a brother who died of a  massive MI at age 2.  She has one sister who is living.   Past medical history is also notable for a hysterectomy, bladder surgery,  and foot surgery in the past.   SOCIAL HISTORY:  Patient denies tobacco or ethanol use.  She stopped smoking  in 1959.   REVIEW OF SYSTEMS:  As noted in the HPI.   PHYSICAL EXAMINATION:  VITAL SIGNS:  Blood pressure 116/84.  The pulse was  99 and irregularly irregular.  Respirations were 18.  The weight was 204  pounds.  GENERAL:  She is a pleasant, obese woman in no acute distress.  HEENT:  Normocephalic and atraumatic.  Pupils are equal and round.  Oropharynx was moist.  Sclerae are anicteric.  NECK:  The neck revealed 8-9 cm of jugular  venous distention.  There was no  thyromegaly.  Trachea is midline.  Carotids were 2+ and symmetric.  LUNGS:  The lungs revealed rales in the bases.  No increased work of  breathing.  There were no wheezes or rhonchi present.  CARDIOVASCULAR:  An irregular rhythm with a normal S1 and a split S2.  The  PMI was enlarged and laterally displaced.  ABDOMEN:  Obese, nontender, nondistended.  There was no organomegaly  present.  Bowel sounds were present.  There was no guarding or rebound.  EXTREMITIES:  No clubbing, cyanosis or edema.  The pulses were 2+ and  symmetric.  NEUROLOGIC:  Alert and oriented x3 with cranial nerves intact.  The strength  was 5/5 and symmetric.   EKG demonstrates atrial fibrillation with a controlled ventricular response.  There is an underlying left bundle branch block and a QRS duration of 164  ms.   IMPRESSION:  1. Symptomatic tachy-brady syndrome.  2. Persistent atrial fibrillation.  3. Amiodarone toxicity.  4. Chronic Coumadin therapy.  5.  Congestive heart failure.  6. Left bundle branch block.   DISCUSSION:  The treatment and etiology of this patient are somewhat  difficult.  Three options are available at this point.  My regard first  would be to try her on tikosyn with hospitalization.  Before we can do this,  we have to make sure that her amiodarone level is not such that it would be  unsafe to give her tikosyn.  She should tolerate this with normal kidney  function.  The second option would be to proceed with AV node ablation and  BiV ICD implantation.  This may ultimately be required, the patient has had  previously preserved LV function in sinus rhythm, and it may be that getting  her back to sinus rhythm, her LV function will normalize.  I am concerned  because of underlying tachy-brady syndrome and sinus bradycardia when she is  in sinus rhythm.  Hopefully, this will not be an issue.  The third option  would be to try a primary A fib ablation; however, because of the patient's  significant structural heart disease, LV dysfunction, left bundle branch  block, moderate left atrial enlargement, moderate pulmonary hypertension.  I  think the likelihood of successful A fib ablation in this particular patient  would be extraordinarily unlikely.  For all of the above reasons, I have  recommended that we today go ahead and check her amiodarone level, and if it  is low enough, we will plan on admitting her in  the next week or two for initiation of Tikosyn.  Ultimately, AV node  ablation or BiV ICD implantation with increase in her AV nodal blocking  drugs would be a strong consideration.                                   Champ Mungo. Lovena Le, MD   GWT/MedQ  DD:  05/21/2006  DT:  05/21/2006  Job #:  [id number]   cc:   Delfino Lovett A. Rollene Fare, MD  [facility]

## 2011-02-24 NOTE — Assessment & Plan Note (Signed)
[facility]                         ELECTROPHYSIOLOGY OFFICE NOTE   [name], [name]                    MRN:          [id number]  DATE:09/19/2006                            DOB:          Oct 17, 1933    [name] returns today for followup. She is a very pleasant woman  with a history of a nonischemic cardiomyopathy and atrial fibrillation  who is status post AV node ablation and BiV pacemaker insertion. Her  heart failure symptoms are markedly improved since undergoing AV node  ablation of BiV pacemaker. Following the procedure, she was bothered by  diaphragmatic stimulation. The patient returns today for followup. She  notes that when she lies down flat or on her left side she has  diaphragmatic stimulation and if she reclines in her recliner she will  have this as well. Despite this, she feels much better than she has in  many years by her report.   PHYSICAL EXAMINATION:  GENERAL:  On exam today, she is a pleasant, well-  appearing woman in no distress.  VITAL SIGNS:  Blood pressure today was 112/78, the pulse 83 and regular,  respirations were 18, the weight was 188 pounds.  NECK:  Revealed no jugular venous distention.  LUNGS:  Clear bilaterally to auscultation.  CARDIOVASCULAR:  Revealed a regular rate and rhythm with normal S1 and  S2. She had intermittent diaphragmatic stimulation.  EXTREMITIES:  Demonstrated no edema.   Her pacemaker was interrogated today and her threshold was actually  markedly decreased down to 2.5 at 0.4 and 2 at 0.7. Despite this, she  still had occasional amounts of diaphragmatic stimulation. At 1.5 at 0.7  she had capture, at 1.5 at 1.2 she had capture with diaphragmatic  stimulation.  Otherwise no other findings on her pacemaker  interrogation.   IMPRESSION:  1. Nonischemic cardiomyopathy.  2. Congestive heart failure.  3. Atrial fibrillation with a rapid ventricular response.  4. Status post AV node  ablation and BiV pacemaker insertion.  5. Diaphragmatic stimulation.   DISCUSSION:  Today we spent approximately 20 minutes working on  different pacing configurations for [name]. Basically the LV to RV  configuration worked best with best threshold. Just at threshold, there  was no diaphragmatic stimulation. With a small safety margin , she had  very minimal amounts of diaphragmatic stimulation and certainly much  better than what she had previously. We reprogrammed her today to 2  volts at 0.8 msec and will plan to  see her back in the office in several months for hopefully decreasing  energy levels to minimize diaphragmatic stimulation. Overall, her heart  failure is markedly improved.     Champ Mungo. Lovena Le, MD  Electronically Signed    GWT/MedQ  DD: 09/19/2006  DT: 09/19/2006  Job #: [id number]   cc:   Delfino Lovett A. Rollene Fare, M.D.

## 2011-02-24 NOTE — H&P (Signed)
NAMEJEILA, HUFFSTUTLER             ACCOUNT NO.:  [id number]   MEDICAL RECORD NO.:  [id number]          PATIENT TYPE:  INP   LOCATION:  2915                         FACILITY:  [facility]   PHYSICIAN:  Deboraha Sprang, MD, FACCDATE OF BIRTH:  07-30-1934   DATE OF ADMISSION:  06/13/2006  DATE OF DISCHARGE:                                HISTORY & PHYSICAL   ELECTROPHYSIOLOGIST:  Champ Mungo. Lovena Le, MD   PRIMARY CARE PHYSICIAN:  Dr. Dewitt Hoes.   CARDIOLOGIST:  Richard A. Rollene Fare, M.D.   ALLERGIES:  PENICILLIN.   PRESENTING CIRCUMSTANCE:  I got to the point where I just couldn't  breathe.   [name] is a 77 year old female who has a history of congestive  heart failure with diastolic dysfunction.  The class of congestive heart  failure is rated at #3, but this is exacerbated by atrial fibrillation in  rapid ventricular response.  She is admitted through the office at [facility] with decompensation of class III to class IV congestive heart  failure.  She was admitted in late August and discharged June 08, 2006  with a hospital visit aimed primarily at controlling atrial fibrillation.  When she has atrial fibrillation and rapid ventricular rate, she has  fatigue, dyspnea on exertion, malaise, anorexia, dyspnea, dizziness, and  palpitations.  Prior to her last visit, rhythm and rate control have been  difficult.  She was tried on amiodarone but had decreased DLCO and pulmonary  toxicity, and this was discontinued.  She was then admitted this last time  in late August for Tikosyn therapy.  This was loaded for 48 hours but failed  to convert spontaneously pharmacologically, so she underwent direct current  cardioversion x2.  This also failed to convert to a holding or maintenance  of sinus rhythm.  Because of this, she underwent implantation of Medtronic  InSync III CRT pacemaker on August 29th, and then the day following, August  30th, underwent AV node ablation,  causing the patient to be pacemaker  dependent.  The patient was discharged on August 31 but says that since that  time, she has basically been unable to sleep.  She has rough nights.  She  gets fatigued just walking across a room, short of breath, with intermittent  dizziness.  She has orthopnea and anorexia.  At first, she thought that it  would get better, that it would take a little while for the BiV pacing  feature to show some results, but instead, it steadily got worse.  She  called the office yesterday, September 4th, and was told to come in today,  September 5th.   In the office, it was found that she had high LV threshold with subsequent  failure to capture.  The pacemaker was reprogrammed for LV output to fix at  0.4 mV.  In the past, she has had an echocardiogram which showed preserved  left ventricular function with moderate mitral regurgitation.   A Myoview in January, 2006 was negative for ischemia.  Patient also has  diabetes and mild renal insufficiency, left bundle branch block, and  moderate pulmonary  hypertension.  During her last hospitalization at the end  of August, 2007, she was told that her sister had died suddenly.  She was  close to her sister but because of debilitation and feeling that she would  benefit from hospitalization, she declined offer for immediate discharge and  postponement of Tikosyn therapy to go ahead with the therapy.  She completed  therapy as well as completed implantation of BiV pacemaker and AV node  ablation.  After being seen in the office, she transfers to [facility] for IV diuresis, IV dobutamine, IV decompensated congestive heart  failure with primarily diastolic dysfunction.   PHYSICAL EXAMINATION:  GENERAL:  On admission to [facility], she is  relatively calm.  She is alert and oriented x3.  VITAL SIGNS:  She is achieving oxygen saturation of 100% on 2 liters of  nasal cannula.  Heart rate is 60 with V pacing.   PULMONARY:  There are basilar rales bilaterally.  Jugular venous distention  is 9-10 cm bilaterally.  HEART:  Regular rate and rhythm.  EXTREMITIES:  Mild 2+ edema bilaterally.   MEDICATIONS THIS ADMISSION:  1. Digoxin 0.62 mg daily.  2. Glipizide 5 mg twice daily.  3. Furosemide 40 mg IV every 12 hours.  4. Lisinopril 10 mg daily.  5. Levothyroxine 50 mcg daily.  6. Zetia 10 mg daily.  7. Enteric-coated aspirin 81 mg daily.  8. Protonix 40 mg daily.  9. Coumadin per pharmacy.  10.Hydrocodone as needed for pain control.  11.Either will be Toprol XL 50 mg or starting Coreg.  12.We will also start her on IV dobutamine 5 mcg/kg per minute.   Daily weights will be obtained.  Her weight this admission was 192.  Her  weight last admission was 204.   Laboratory studies will be obtained, including a CMET, PT/INR, CBC, cardiac  enzymes, BNP, __________, chest x-ray, PA and lateral, and serum glucose  will be monitored a.c. and nightly.  Her diet will be a carbohydrate-  modified diet  with a 1200 cc fluid restriction.  She may be scheduled for  left ventricular lead revision this admission.     ______________________________  Sueanne Margarita, PA    ______________________________  Deboraha Sprang, MD, [facility]    GM/MEDQ  D:  06/13/2006  T:  06/13/2006  Job:  [id number]

## 2011-02-24 NOTE — Assessment & Plan Note (Signed)
[facility] OFFICE NOTE   [name]                    MRN:          [id number]  DATE:08/03/2006                            DOB:          02-Jun-1934    [name] returns today for follow up. She is a very pleasant, elderly  woman with history of atrial fibrillation and very rapid ventricular  response with subsequent development of a tachy induced cardiomyopathy. The  patient failed Tegison, and she had terrible side effects on amiodarone in  the past and for this reason underwent AV node ablation and permanent  pacemaker insertion with a biventricular device placed on June 08, 2006.  The day after the procedure the patient was bothered by diaphragmatic  stimulation and subsequently had to have her device reprogrammed. As such,  she now continues to pace the left ventricle satisfactorily but does have  diaphragmatic stimulation when she lies on her back or on her left side.  Lying on her right side does not produce stimulation and she has very  minimal amounts sitting and none standing. Unfortunately her pacing  threshold has not particularly decreased. She has had no additional symptoms  and her heart failure is gone from class 3 really to class 1.   PHYSICAL EXAMINATION:  On physical exam today she is a pleasant, elderly  woman in no acute distress.  VITAL SIGNS:  The blood pressure today was 132/64, the pulse was 88 and  regular, the respirations were 18. Weight was 183 pounds.  NECK: Revealed no jugular venous distension.  LUNGS: Clear bilaterally to auscultation.  CARDIOVASCULAR EXAM: Had a regular rate and rhythm with normal S1 and S2.  EXTREMITIES: Demonstrated no edema.   INTERROGATION OF PACEMAKER:  Demonstrates a Medtronic in synch with R waves  of 22 and an impedence of 654 ohms, the pacing threshold was 2.5 at 1, and 2  at 1.4 in the left ventricle with impedences of 670  ohms. Her lower rate was  still set at 80. We decreased this down to 70 today.   IMPRESSION:  1. Atrial fibrillation with a rapid ventricular response.  2. Tachycardia induced cardiomyopathy.  3. Status post biventricular pacemaker insertion with AV node ablation      with marked improvement in her symptoms but unfortunately still      occasional diaphragmatic stimulation.   DISCUSSION:  Overall [name] is stable today. I gave her three options.  One would be to continue as we are presently with intermittent diaphragmatic  stimulation. My hope would be if we did this that ultimately her threshold  would decrease in the left ventricle resulting in cessation of her  diaphragmatic stimulation.  The second option would be to proceed with  reoperation and left ventricular lead revision.  The third option would be  to consider epicardial lead placement. At present the patient would like to  continue as she is and is willing to live with the diaphragmatic  stimulation. I plan to see her back in several months. Hopefully her pacing  thresholds will decrease in the left ventricle.  ______________________________  Champ Mungo. Lovena Le, MD    GWT/MedQ  DD: 08/03/2006  DT: 08/05/2006  Job #: [id number]   cc:   Delfino Lovett A. Rollene Fare, M.D.

## 2011-02-24 NOTE — Op Note (Signed)
[name], [name]             ACCOUNT NO.:  [id number]   MEDICAL RECORD NO.:  [id number]          PATIENT TYPE:  OIB   LOCATION:  2899                         FACILITY:  [facility]   PHYSICIAN:  Richard A. Rollene Fare, M.D.DATE OF BIRTH:  03-Dec-1933   DATE OF PROCEDURE:  07/21/2005  DATE OF DISCHARGE:                                 OPERATIVE REPORT   ADDENDUM:  Post DC cardioversion, a 12-lead EKG approximately 30 minutes  post procedure showed sinus bradycardia with PR interval 0.208, first degree  heart block, rate of 48 per minute, and left bundle branch block with  secondary ST-T changes.  The patient will be monitored postoperatively to  make sure there is no recurrent intermittent heart block.  Hopefully, she  will maintain sinus rhythm post cardioversion.  If not, plans as outlined in  prior dictation, particularly if she has had symptomatic bradycardia.      Richard A. Rollene Fare, M.D.  Electronically Signed     RAW/MEDQ  D:  07/21/2005  T:  07/21/2005  Job:  [id number]   cc:   Dewitt Hoes  Fax: [phone]

## 2011-02-24 NOTE — Discharge Summary (Signed)
[name], [name]             ACCOUNT NO.:  [id number]   MEDICAL RECORD NO.:  [id number]          PATIENT TYPE:  INP   LOCATION:  3705                         FACILITY:  [place]   PHYSICIAN:  Champ Mungo. Lovena Le, MD    DATE OF BIRTH:  12-Oct-1933   DATE OF ADMISSION:  06/07/2006  DATE OF DISCHARGE:  06/08/2006                                 DISCHARGE SUMMARY   ALLERGY:  PENICILLIN.   This is a 40-minute discharge and exam preparation.   PRIMARY DIAGNOSES:  1. Discharging day #2 status post implantation of Medtronic INSYNC III CRT      pacemaker.  2. Discharge day #1 status post atrioventricular ablation.  THE PATIENT IS      PACEMAKER-DEPENDENT.      a.     Pacer set at 90 beats per minute at discharge.  3. Atrial fibrillation, rapid ventricular rate, symptomatic.      a.     Fatigue, dyspnea on exertion, malaise.  4. Class III congestive heart failure, exacerbated by atrial fibrillation      in rapid rate.  5. Rate and rhythm control difficult.      a.     Intolerant of amiodarone.      b.     Failed direct current cardioversion under Tikosyn June 01, 2006.   SECONDARY DIAGNOSES:  1. Normal left ventricular function/diastolic dysfunction.  2. Chronic Coumadin.  3. Chronic renal insufficiency.   PROCEDURES:  1. May 09, 2006, implantation of Medtronic [facility] III CRT pacemaker, Dr.      Cristopher Peru.  The patient tolerated this procedure well.  There is no      hematoma.  His chest x-ray post procedure shows that the leads are in      appropriate position.  The patient is actually resting well.  2. June 08, 2006, AV node ablation for atrial fibrillation with      difficult rate/rhythm control, Dr. Cristopher Peru.   BRIEF HISTORY:  Ms. [name] is a 77 year old female.  She has a history of  persistent atrial fibrillation.  She is on chronic Coumadin.  She has a  history of amiodarone intolerance.  She has a history of symptomatic  tachybrady syndrome.  She was  referred to Dr. Cristopher Peru for further  evaluation, and the plan was to admit the patient electively for initiation  of Tikosyn.  This was done on August 2.  She was placed on Tikosyn and then  did not spontaneously convert to sinus rhythm.  Therefore, she was  cardioverted on June 01, 2006.  Unfortunately, she did not to maintain  sinus rhythm after cardioversion, and the patient and was discharged on  Toprol for rate control.  The plan is to follow up for a the CRT, pacemaker  and AV nodal ablation.  The patient presents for this a electively on August  30.  Immediately the patient was taken to the electrophysiology lab and a  Medtronic bi-V pacemaker was implanted without difficulty.  The patient did  not have AV node ablation as part of the  procedure because she had marked  tricuspid regurgitation, and the procedure for AV node ablation will be  scheduled for the following day.   Procedure #2 was June 08, 2006, AV node ablation, Dr. Cristopher Peru.  The  patient has had no postprocedural complications and is planning for  discharge post AV node ablation day #1.  Her pacemaker has been set at 90  beats per minute and this will be serially decremented to 80 beats per  minute after 2 weeks and then to 70 beats per minute after 4 weeks.  The  patient is asked not to drive for 1 week, not to lift anything heavier than  10 pounds for the next 4 weeks, to keep her incision dry for the next 7 days  and sponge bathe until Friday, September 7.  Her discharge diet is a low-  sodium, low-cholesterol diet.   Her discharge medicines include:  1. Toprol XL 50 mg daily.  2. Digoxin 0.062 mg daily.  3. Glipizide 5 mg twice daily.  4. Furosemide 40 mg daily.  5. Lisinopril 10 mg daily.  6. Levothyroxine 50 mcg daily.  7. Zetia 10 mg daily.  8. Enteric-coated aspirin 81 mg daily.  9. Zantac 75 mg daily.  10.Hydrocodone as needed for pain control.  11.Coumadin 5 mg to alternate daily with 2.5  mg.   She has follow-up with the pacer clinic Wednesday, June 20, 2006, at 10  o'clock.  This is at [facility], [address], and she  will see Dr. Rollene Fare Friday, September 21, at 11:30 the morning.   LABORATORY STUDIES THIS ADMISSION:  On August 30 hemoglobin was 15.9,  hematocrit 47.7, white cells 8.1 and platelets of 304.  Serum electrolytes  August 30:  Sodium 139, potassium 3.9, chloride 102, carbonate 25, BUN is  28, creatinine 1.4 and glucose 91.  PT on August 31 is 23.1, INR 1.9.  she  will receive a slightly higher dose of Coumadin today.  Digoxin level is  less than 2.     ______________________________  Sueanne Margarita, PA    ______________________________  Champ Mungo. Lovena Le, MD    GM/MEDQ  D:  06/08/2006  T:  06/09/2006  Job:  [id number]   cc:   Delfino Lovett A. Rollene Fare, M.D.

## 2011-02-24 NOTE — Op Note (Signed)
NAMEFERREL, TURCO             ACCOUNT NO.:  [id number]   MEDICAL RECORD NO.:  [id number]          PATIENT TYPE:  INP   LOCATION:  3705                         FACILITY:  [facility]   PHYSICIAN:  Champ Mungo. Lovena Le, MD    DATE OF BIRTH:  08-May-1934   DATE OF PROCEDURE:  06/07/2006  DATE OF DISCHARGE:                                 OPERATIVE REPORT   PROCEDURE PERFORMED:  Implantation of a biventricular pacemaker implant.   SURGEON:  Champ Mungo. Lovena Le, MD   I. INTRODUCTION:  The patient is a 77 year old woman with a history of  congestive heart failure, left bundle branch block, and uncontrolled atrial  fibrillation.  She has been on multiple medications to try to maintain sinus  rhythm, but unfortunately has been unable to either tolerate her medicines  or maintain sinus, despite being tried on amiodarone and Tikosyn.  The  patient is now referred for permanent pacemaker insertion (BiV), followed by  AV node ablation to permanently control her ventricular rate and atrial  fibrillation.   II. PROCEDURE:  After informed consent was obtained, the patient was taken  to the diagnostic EP lab in the fasting state.  After the usual preparation  and draping, intravenous fentanyl and midazolam were given for sedation.  Thirty milliliters of lidocaine were infiltrated in the left infraclavicular  region.  A 7-cm incision was carried out over this region and electrocautery  utilized to dissect down to the fascial plane.  The left subclavian vein was  subsequently punctured x2 and the Medtronic model 5076, 58-cm active-  fixation pacing lead, serial number [id number], was advanced into the right  ventricle.  Mapping was carried out and at the final site on the RV septum,  the R waves were 18, the pacing impedance 859 ohms and the pacing threshold  0.4 volts at 0.5 milliseconds.  Ten-volt pacing did not stimulate the  diaphragm.  With these satisfactory parameters, attempt was then turned to  placement of the left ventricular lead.  It should be noted that a right  atrial lead was not placed secondary to her chronic atrial fib.  Next, the  Medtronic guiding catheter was advanced into the right atrium along with the  6-French EP hexapolar catheter.  The coronary sinus was cannulated without  particular difficulty.  It should be noted that both the right and left  atrium were quite large.  The guiding catheter was advanced into the  coronary sinus and venography of the coronary sinus was carried out.  This  demonstrated a nice lateral vein which had a 180-degree shepherd's crook at  its takeoff.  This vein was chosen for LV lead placement.  The Medtronic  Attain subselective guiding catheter was then advanced into the coronary  sinus guiding catheter and selectively used to cannulate the lateral vein.  A Mailman 0.014 angioplasty guidewire was then advanced into the lateral  vein out towards the apex of the left ventricle.  The Attain subselective  catheter was then removed, leaving the guiding catheter in place.  The  Medtronic model 4194 88-cm bipolar passive-fixation LV pacing lead, serial  number [id number] was subsequently advanced into the lateral vein.  The LV  waves at the final site, which was about two-thirds the distance from base  to apex, measured 5 mV, the pacing impedance was 1127 ohms and threshold  (unipolar) was 1.2 volts at 0.8 milliseconds.  Diaphragmatic stimulation was  observed at 10 volts, but not at 6.  With these satisfactory parameters, the  LV and the RV leads were secured to subpectoralis fascia with a figure-of-  eight silk suture.  The sew-in sleeve was also secured with silk suture.  Electrocautery was then utilized to make a subcutaneous pocket.  Kanamycin  irrigation was utilized to irrigate the pocket.  Electrocautery was utilized  to assure hemostasis.  The Medtronic InSync III model [id number] biventricular  pacemaker, serial number [id number], was  then connected to the RV and LV  leads.  The right atrial port was capped.  The generator was placed back in  the subcutaneous pocket and secured with a silk suture.  Kanamycin  irrigation was utilized to irrigate the pocket and the incision was then  closed with layer of 2-0 Vicryl followed by a layer of 3-0 Vicryl.  Benzoin  was painted on the skin, Steri-Strips were applied and a pressure dressing  was placed and the patient was returned to her room in satisfactory  condition.  It should be noted that AV node ablation was not attempted today  at the time of the procedure secondary to the  patient's marked tricuspid regurgitation and concerns about dislodgement of  the pacing leads immediately after AV node ablation.  The plan will be to  proceed tomorrow for AV node ablation, once the LV and RV leads had been  assured to be satisfactorily in position.           ______________________________  Champ Mungo. Lovena Le, MD     GWT/MEDQ  D:  06/07/2006  T:  06/08/2006  Job:  [id number]   cc:   Delfino Lovett A. Rollene Fare, M.D.

## 2011-02-24 NOTE — Discharge Summary (Signed)
NAMECARMEN, WHERLEY             ACCOUNT NO.:  [id number]   MEDICAL RECORD NO.:  [id number]          PATIENT TYPE:  INP   LOCATION:  2020                         FACILITY:  [facility]   PHYSICIAN:  Gene Serpe, PA-C       DATE OF BIRTH:  March 31, 1934   DATE OF ADMISSION:  05/29/2006  DATE OF DISCHARGE:  04/18/2006                                 DISCHARGE SUMMARY   PRIMARY CARDIOLOGIST:  Delfino Lovett A. Rollene Fare, M.D.   PRIMARY ELECTROPHYSIOLOGIST:  Champ Mungo. Lovena Le, MD   PRINCIPAL DIAGNOSES:  1. Recurrent atrial fibrillation with rapid ventricular response.      a.     Failed a DC cardioversion, on Tikosyn, August 24th.      b.     Admitted for Tikosyn load.  2. Symptomatic tachybrady syndrome.  3. Chronic left bundle branch block.  4. Prolonged QT interval.  5. Hypertension.  6. Mild congestive heart failure.  7. Status post hypokalemia.   SECONDARY DIAGNOSES:  1. History of normal left ventricular function.  2. Chronic Coumadin.  3. Chronic renal insufficiency.   PROCEDURES:  Direct recurrent cardioversion, by Dr. Jenkins Rouge, August  24th.   REASON FOR ADMISSION:  Ms. Venecia is a 77 year old female, with history of  persistent atrial fibrillation on chronic Coumadin, history of amiodarone  intolerance, and history of symptomatic tachybrady syndrome, recently  referred to Dr. Cristopher Peru in the office for further evaluation.  Recommendation was to admit electively for initiation of Tikosyn load.   HOSPITAL COURSE:  The patient was placed on Tikosyn 250 mcg every 12 hours.  She remained therapeutic since admission and, following failure to  chemically convert to and as are, arrangements were made for DC  cardioversion on hospital day number 3.  This was performed by Dr. Jenkins Rouge who applied 200 joules (x2), with subsequent success with conversion  to NSR.   Subsequently, however, patient reverted back to atrial fibrillation wherein  she remained by time of discharge the  following morning.  Her rate was not  adequately controlled (80-120s) and Dr. Cristopher Peru recommended the  addition of Toprol XL 50 every day at time of discharge.   Of note, INR was 1.9 at time of discharge - the patient is to receive 7.5 mg  of Coumadin prior to discharge.   The patient was noted to have some QT prolongation, which was closely  followed by Dr. Cristopher Peru.  This did remain stable in the 0.54 range, and  Dr. Lovena Le also pointed out the patient has underlying chronic LBBB.   Final recommendations per Dr. Lovena Le, at time of discharge, is to maintain  patient on Tikosyn at this current dose to allow possible delayed successful  return of NSR.  He will also plan on scheduling AV nodal ablation/permanent  pacemaker implantation in the next 2 weeks as well.  The patient will also  be discharged on a mild anxiolytic.   MEDICATIONS AT DISCHARGE:  1. Tikosyn 0.25 mg every 12 hours.  2. Toprol XL 50 mg every day.  3. Coumadin 5/2.5 mg alternating daily.  4. Digitek  0.0625 mg every day.  5. Glipizide 5 mg b.i.d.  6. Furosemide 40 mg b.i.d.  7. Lisinopril 10 mg every day.  8. Levothyroxine 0.05 mg every day.  9. Zetia 10 mg every day.  10.Aspirin 81 mg every day.  11.Hydrocodone as previously directed.  12.Zantac 75 every day.  13.Potassium OTC.  14.Xanax 0.25 mg every 6 hours p.r.n.   FOLLOW UP:  Dr. Cristopher Peru on September 6 at 12 noon.   INSTRUCTIONS:  The patient is recommended to have a follow pro time and a  BMET this Tuesday, August 28, at Dr. Stefanie Libel office.   DISPOSITION:  Stable.           ______________________________  Mannie Stabile, PA-C     GS/MEDQ  D:  06/02/2006  T:  06/02/2006  Job:  [id number]   cc:   Delfino Lovett A. Rollene Fare, M.D.  Dewitt Hoes

## 2011-02-24 NOTE — Op Note (Signed)
[name], [name]             ACCOUNT NO.:  [id number]   MEDICAL RECORD NO.:  [id number]          PATIENT TYPE:  OIB   LOCATION:  2899                         FACILITY:  [facility]   PHYSICIAN:  Richard A. Rollene Fare, M.D.DATE OF BIRTH:  [birth year]   DATE OF PROCEDURE:  DATE OF DISCHARGE:                                 OPERATIVE REPORT   PROCEDURE:  DC cardioversion.   [name] is a 77 year old white married mother of five with 12  grandchildren and  six great grandchildren. She is long-term patient of  mine.  She is a nonsmoker. She has renovascular hypertension and remote left  renal artery stenting in 1994 with no restenosis on follow-up as recently as  January of 2006. She has chronic left bundle branch block, negative  Cardiolite for ischemia in the past and recently March of 2006, (breast  attenuation). She has venous insufficiency, chronic mild edema, Statin  intolerance, but is on Zetia for hyperlipidemia and GERD. She was  hospitalized at [facility] for a syncopal episode and was found to  have urosepsis April of 2006.  On admission, she had atrial fibrillation  which was new onset. She was seen post hospitalization by me with  symptomatic AF with exercise intolerance, fatigue and occasional  palpitations but no chest pain. She was started on outpatient Betapace  therapy, continued on Coumadin and was found to have therapeutic INRs. 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 She was last seen June 28, 2005, and set up for elective outpatient DC cardioversion on Betapace 80  b.i.d. She was brought to the hospital on the same-day admission section.  INR is been therapeutic. BUN and creatinine 19/0.9, potassium 4.7.  Preoperatively CBC and diff normal.  TSH 3.5.  Normal urinalysis. Informed  consent was obtained from the patient and her daughter to proceed with DC  cardioversion. Back up pacing externally was available because of the  patient's  underlying bundle branch block.   She was in the postabsorptive state, hydrated preprocedure. She was given  100 mg of IV Diprivan by Dr. Tobias Alexander from anesthesia. DC cardioversion was  done with AP paddles, synchronized biphasic shocks were given at 150 joules,  repeat at 200 joules and a third shock at 200 joules. With this, she  initially failed to convert to sinus rhythm.  Several minutes later, she had  episodic sinus beats but recurrent atrial flutter pattern. She also had  short episodic 2:1 heart block noted on the monitor that resolved. She had  intermittent sinus beats approximately 10 minutes after the procedure. She  awoke without problems, no neurologic deficit and tolerated the procedure  well.   It is unlikely that she will hold sinus rhythm with only episodic sinus  beats.  We did not want to do any further cardioversions because of her,  although short-lived, episodic 2:1 heart block with her underlying left  bundle branch block. She did not require any temporary pacing.   She is a potential candidate for either increase in her Betapace therapy  depending on heart rate or switching antiarrhythmic to agents  such as  amiodarone. She may require permanent pacing in this setting especially with  her history of left bundle branch block, transient 2:1 heart block and past  history of syncope April of 2006.  She will be followed up as an outpatient  post DCCV.      Richard A. Rollene Fare, M.D.  Electronically Signed     RAW/MEDQ  D:  07/21/2005  T:  07/21/2005  Job:  [id number]   cc:   Dewitt Hoes  Fax: [phone]

## 2011-04-07 ENCOUNTER — Encounter: Payer: Self-pay | Admitting: Internal Medicine

## 2012-11-08 ENCOUNTER — Other Ambulatory Visit (HOSPITAL_COMMUNITY): Payer: Self-pay | Admitting: Cardiovascular Disease

## 2012-11-08 DIAGNOSIS — Z95810 Presence of automatic (implantable) cardiac defibrillator: Secondary | ICD-10-CM

## 2012-11-08 DIAGNOSIS — I509 Heart failure, unspecified: Secondary | ICD-10-CM

## 2012-11-08 DIAGNOSIS — I701 Atherosclerosis of renal artery: Secondary | ICD-10-CM

## 2012-11-08 DIAGNOSIS — I351 Nonrheumatic aortic (valve) insufficiency: Secondary | ICD-10-CM

## 2012-11-21 ENCOUNTER — Encounter: Payer: Self-pay | Admitting: *Deleted

## 2012-11-27 ENCOUNTER — Ambulatory Visit (HOSPITAL_COMMUNITY)
Admission: RE | Admit: 2012-11-27 | Discharge: 2012-11-27 | Disposition: A | Discharge status: Home / Self Care | Payer: Medicare Other | Source: Ambulatory Visit | Attending: Cardiovascular Disease | Admitting: Cardiovascular Disease

## 2012-11-27 DIAGNOSIS — I4891 Unspecified atrial fibrillation: Secondary | ICD-10-CM | POA: Insufficient documentation

## 2012-11-27 DIAGNOSIS — I701 Atherosclerosis of renal artery: Secondary | ICD-10-CM | POA: Insufficient documentation

## 2012-11-27 DIAGNOSIS — I509 Heart failure, unspecified: Secondary | ICD-10-CM

## 2012-11-27 DIAGNOSIS — I351 Nonrheumatic aortic (valve) insufficiency: Secondary | ICD-10-CM

## 2012-11-27 DIAGNOSIS — Z95810 Presence of automatic (implantable) cardiac defibrillator: Secondary | ICD-10-CM

## 2012-11-27 DIAGNOSIS — I359 Nonrheumatic aortic valve disorder, unspecified: Secondary | ICD-10-CM | POA: Insufficient documentation

## 2012-11-27 NOTE — Progress Notes (Signed)
Renal Duplex Completed. Lavaughn Bisig D  

## 2012-11-27 NOTE — Progress Notes (Signed)
[facility]   2D echo completed 11/27/2012.   Jamison Neighbor, RDCS

## 2013-06-16 LAB — PACEMAKER DEVICE OBSERVATION

## 2013-07-03 ENCOUNTER — Encounter: Payer: Self-pay | Admitting: Cardiovascular Disease

## 2013-08-22 ENCOUNTER — Telehealth: Payer: Self-pay | Admitting: Internal Medicine

## 2013-08-22 NOTE — Telephone Encounter (Signed)
08-22-13 lmm @ 315pm to have pt set up device fu with dr allred, former [facility] pt/mt

## 2013-09-25 ENCOUNTER — Encounter: Payer: Self-pay | Admitting: Internal Medicine

## 2013-10-31 ENCOUNTER — Ambulatory Visit (INDEPENDENT_AMBULATORY_CARE_PROVIDER_SITE_OTHER): Payer: Medicare HMO | Admitting: Internal Medicine

## 2013-10-31 ENCOUNTER — Encounter: Payer: Self-pay | Admitting: Internal Medicine

## 2013-10-31 VITALS — BP 129/69 | HR 73 | Ht 68.0 in | Wt 194.0 lb

## 2013-10-31 DIAGNOSIS — I5022 Chronic systolic (congestive) heart failure: Secondary | ICD-10-CM

## 2013-10-31 DIAGNOSIS — I472 Ventricular tachycardia: Secondary | ICD-10-CM

## 2013-10-31 DIAGNOSIS — I428 Other cardiomyopathies: Secondary | ICD-10-CM

## 2013-10-31 DIAGNOSIS — Z95810 Presence of automatic (implantable) cardiac defibrillator: Secondary | ICD-10-CM

## 2013-10-31 DIAGNOSIS — Z950 Presence of cardiac pacemaker: Secondary | ICD-10-CM

## 2013-10-31 DIAGNOSIS — I509 Heart failure, unspecified: Secondary | ICD-10-CM

## 2013-10-31 DIAGNOSIS — I4729 Other ventricular tachycardia: Secondary | ICD-10-CM

## 2013-10-31 DIAGNOSIS — I4891 Unspecified atrial fibrillation: Secondary | ICD-10-CM

## 2013-10-31 LAB — MDC_IDC_ENUM_SESS_TYPE_INCLINIC
Battery Voltage: 2.72 V
Brady Statistic AP VP Percent: 0 %
Brady Statistic AP VS Percent: 0 %
Brady Statistic AS VP Percent: 97.73 %
Brady Statistic AS VS Percent: 2.27 %
Brady Statistic RA Percent Paced: 0 %
Brady Statistic RV Percent Paced: 97.73 %
Date Time Interrogation Session: 20150123105955
HighPow Impedance: 228 Ohm
HighPow Impedance: 285 Ohm
HighPow Impedance: 47 Ohm
HighPow Impedance: 63 Ohm
Lead Channel Impedance Value: 342 Ohm
Lead Channel Impedance Value: 456 Ohm
Lead Channel Impedance Value: 456 Ohm
Lead Channel Impedance Value: 494 Ohm
Lead Channel Impedance Value: 646 Ohm
Lead Channel Pacing Threshold Amplitude: 1 V
Lead Channel Pacing Threshold Amplitude: 2.75 V
Lead Channel Pacing Threshold Pulse Width: 0.4 ms
Lead Channel Pacing Threshold Pulse Width: 1 ms
Lead Channel Sensing Intrinsic Amplitude: 3.5 mV
Lead Channel Sensing Intrinsic Amplitude: 3.5 mV
Lead Channel Sensing Intrinsic Amplitude: 7.375 mV
Lead Channel Setting Pacing Amplitude: 2 V
Lead Channel Setting Pacing Amplitude: 3.75 V
Lead Channel Setting Pacing Pulse Width: 0.4 ms
Lead Channel Setting Pacing Pulse Width: 1 ms
Lead Channel Setting Sensing Sensitivity: 0.3 mV
Zone Setting Detection Interval: 310 ms
Zone Setting Detection Interval: 350 ms
Zone Setting Detection Interval: 360 ms
Zone Setting Detection Interval: 400 ms

## 2013-10-31 MED ORDER — GUAIFENESIN ER 600 MG PO TB12: ORAL_TABLET | ORAL | Status: DC

## 2013-10-31 NOTE — Patient Instructions (Addendum)
Your physician has recommended you make the following change in your medication:  1) Guaifenesin - take one tablet (600 mg total) by mouth twice a day as needed for cough  Remote monitoring is used to monitor your Pacemaker of ICD from home. This monitoring reduces the number of office visits required to check your device to one time per year. It allows [name] to keep an eye on the functioning of your device to ensure it is working properly. You are scheduled for a device check from home on 02/02/14. You may send your transmission at any time that day. If you have a wireless device, the transmission will be sent automatically. After your physician reviews your transmission, you will receive a postcard with your next transmission date.  Your physician wants you to follow-up in: 6 months with Dr. Lovena Le.  You will receive a reminder letter in the mail two months in advance. If you don't receive a letter, please call our office to schedule the follow-up appointment.

## 2013-11-01 ENCOUNTER — Encounter: Payer: Self-pay | Admitting: Internal Medicine

## 2013-11-01 NOTE — Assessment & Plan Note (Signed)
Her Medtronic BiV ICD is working normally. Will recheck in several months.

## 2013-11-01 NOTE — Assessment & Plan Note (Signed)
Her symptoms are well controlled. She will continue her current meds. She will maintain a low sodium diet.

## 2013-11-01 NOTE — Assessment & Plan Note (Signed)
Her ventricular rate is well controlled. Will follow.

## 2013-11-01 NOTE — Progress Notes (Signed)
HPI [name] is referred today for ongoing evaluation and management of her ICD. She is a very pleasant 80 yo woman who I initially met approx. 8 years ago when she was referred for BiV PM insertion and AV node ablation which were successfully performed. She did well but then experienced VT and had a BiV ICD placed. She has now had chronic atrial fibrillation. Her heart failure symptoms are class 2. She has not had any additional ICD shocks. She notes mild peripheral edema. Her biggest complaint is related to back pain. She has not had syncope.  Allergies  Allergen Reactions  . Lisinopril   . Morphine   . Penicillins      Current Outpatient Prescriptions  Medication Sig Dispense Refill  . digoxin (LANOXIN) 0.125 MG tablet Take 0.0625 mg by mouth daily.      Marland Kitchen ezetimibe (ZETIA) 10 MG tablet Take 10 mg by mouth daily.      . ferrous fumarate (HEMOCYTE - 106 MG FE) 325 (106 FE) MG TABS Take 1 tablet by mouth.      . furosemide (LASIX) 20 MG tablet Take 20 mg by mouth daily.      Marland Kitchen glipiZIDE (GLUCOTROL) 5 MG tablet Take 5 mg by mouth 2 (two) times daily before a meal.      . insulin detemir (LEVEMIR) 100 UNIT/ML injection Inject 30 Units into the skin at bedtime.      Marland Kitchen levothyroxine (SYNTHROID, LEVOTHROID) 50 MCG tablet Take 50 mcg by mouth daily.      Marland Kitchen losartan (COZAAR) 100 MG tablet Take 100 mg by mouth daily.      . magnesium oxide (MAG-OX) 400 MG tablet Take 400 mg by mouth daily.      Marland Kitchen oxyCODONE-acetaminophen (PERCOCET/ROXICET) 5-325 MG per tablet Take 1 tablet by mouth every 4 (four) hours as needed for pain.      . pantoprazole (PROTONIX) 40 MG tablet Take 40 mg by mouth 2 (two) times daily.      . Potassium 99 MG TABS Take 1 tablet by mouth daily.      . Rivaroxaban (XARELTO) 15 MG TABS tablet Take 15 mg by mouth daily.      . traMADol (ULTRAM) 50 MG tablet Take 50 mg by mouth every 6 (six) hours as needed for pain.      Marland Kitchen guaiFENesin (MUCINEX) 600 MG 12 hr tablet Take  one tablet (600 mg total) by mouth two times a day as needed for cough.  30 tablet  1   No current facility-administered medications for this visit.     Past Medical History  Diagnosis Date  . Chronic a-fib     on xarelto; failed DCCV  . Biventricular ICD (implantable cardiac defibrillator) in place     BiV ICD for nonischemic cardiomyopathy; BiV responder  . Anemia     no GI bleeding; on iron  . VT (ventricular tachycardia) 9/11    polymorphic VT probably related to sotalol therapy  . Amiodarone pulmonary toxicity   . 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 Renal insufficiency, mild     05/01/12 Creat 1.47  . H/O echocardiogram 07/05/2010    EF 45-50%; aortic valve-mildly sclerotic; LA-mod dilaterd   . Hyperlipemia   . Diabetes   . H/O cardiovascular stress test 08/04/2010    normal imaging; EF 61%  . LBBB (left bundle branch block)   . RAS (renal artery stenosis)  renal dopp (12/18/08)- Right RA-<60%; L RA stent- open; nl size and shape in both kidneys  . PVD (peripheral vascular disease)     L RA stent 1994    ROS:   All systems reviewed and negative except as noted in the HPI.   Past Surgical History  Procedure Laterality Date  . Cholecystectomy      Dr. Sherald Hess in [place] and Dr. Lyda Jester follows; surgical stricture post procedure with stent placement  . Brain meningioma excision  2011    L frontal meningioma at [facility]   . Biv icd genertaor change out  11/15/10    Medtronic Protecta [id number] serial [id number]  . Av node ablation  06/08/2006    performed by Dr. Beckie Salts; due to Afib with RVR and tachy brady syndrome  . Biv icd placement  06/17/2007    Medtronic Concerta [id number]; RA lead-Med 5076/53cm, [id number]; RV lead- SJM 7121/65cm, [id number]; LV lead- Med 4194/88cm, [id number]  . Cardioversion  07/21/05    converted to sinus brady  . Pv angio  1994    L Renal artery stent      Family History  Problem Relation Age of Onset  .  Dementia Sister      History   Social History  . Marital Status: Married    Spouse Name: N/A    Number of Children: N/A  . Years of Education: N/A   Occupational History  . Not on file.   Social History Main Topics  . Smoking status: Never Smoker   . Smokeless tobacco: Not on file  . Alcohol Use: No  . Drug Use: Not on file  . Sexual Activity: Not on file   Other Topics Concern  . Not on file   Social History Narrative  . No narrative on file     BP 129/69  Pulse 73  Ht _0  (1.727 m)  Wt 194 lb (87.998 kg)  BMI 29.50 kg/m2  Physical Exam:  Well appearing 80 yo woman, NAD HEENT: Unremarkable Neck:  No JVD, no thyromegally Back:  No CVA tenderness Lungs:  Clear with no wheezes, rales, or rhonchi HEART:  Regular rate rhythm, no murmurs, no rubs, no clicks Abd:  soft, positive bowel sounds, no organomegally, no rebound, no guarding Ext:  2 plus pulses, no edema, no cyanosis, no clubbing Skin:  No rashes no nodules Neuro:  CN II through XII intact, motor grossly intact  EKG - atrial fib with ventricular pacing  DEVICE  Normal device function.  See PaceArt for details.   Assess/Plan:

## 2013-12-05 ENCOUNTER — Other Ambulatory Visit: Payer: Self-pay

## 2013-12-05 NOTE — Telephone Encounter (Signed)
Rx refused - defered to Dr Peter Congo office

## 2013-12-10 ENCOUNTER — Other Ambulatory Visit: Payer: Self-pay

## 2013-12-10 NOTE — Telephone Encounter (Signed)
Rx denied. Patient is now seen and followed by Dr Cristopher Peru.

## 2014-02-02 ENCOUNTER — Encounter: Payer: Self-pay | Admitting: Internal Medicine

## 2014-02-02 ENCOUNTER — Ambulatory Visit (INDEPENDENT_AMBULATORY_CARE_PROVIDER_SITE_OTHER): Payer: Medicare HMO | Admitting: *Deleted

## 2014-02-02 DIAGNOSIS — I428 Other cardiomyopathies: Secondary | ICD-10-CM

## 2014-02-02 DIAGNOSIS — I4891 Unspecified atrial fibrillation: Secondary | ICD-10-CM

## 2014-02-07 LAB — MDC_IDC_ENUM_SESS_TYPE_REMOTE
Battery Voltage: 2.63 V
Brady Statistic AP VP Percent: 0 %
Brady Statistic AP VS Percent: 0 %
Brady Statistic AS VP Percent: 83.33 %
Brady Statistic AS VS Percent: 16.67 %
Brady Statistic RA Percent Paced: 0 %
Brady Statistic RV Percent Paced: 83.33 %
Date Time Interrogation Session: 20150427124130
HighPow Impedance: 266 Ohm
HighPow Impedance: 285 Ohm
HighPow Impedance: 48 Ohm
HighPow Impedance: 58 Ohm
Lead Channel Impedance Value: 323 Ohm
Lead Channel Impedance Value: 456 Ohm
Lead Channel Impedance Value: 456 Ohm
Lead Channel Impedance Value: 494 Ohm
Lead Channel Impedance Value: 703 Ohm
Lead Channel Pacing Threshold Amplitude: 0.75 V
Lead Channel Pacing Threshold Amplitude: 2.75 V
Lead Channel Pacing Threshold Pulse Width: 0.4 ms
Lead Channel Pacing Threshold Pulse Width: 1 ms
Lead Channel Sensing Intrinsic Amplitude: 3.5 mV
Lead Channel Sensing Intrinsic Amplitude: 8.75 mV
Lead Channel Setting Pacing Amplitude: 2 V
Lead Channel Setting Pacing Amplitude: 3.75 V
Lead Channel Setting Pacing Pulse Width: 0.4 ms
Lead Channel Setting Pacing Pulse Width: 1 ms
Lead Channel Setting Sensing Sensitivity: 0.3 mV
Zone Setting Detection Interval: 310 ms
Zone Setting Detection Interval: 350 ms
Zone Setting Detection Interval: 360 ms
Zone Setting Detection Interval: 400 ms

## 2014-02-19 ENCOUNTER — Encounter: Payer: Self-pay | Admitting: Cardiology

## 2014-03-04 ENCOUNTER — Encounter: Payer: Self-pay | Admitting: Internal Medicine

## 2014-03-04 ENCOUNTER — Telehealth: Payer: Self-pay | Admitting: Cardiology

## 2014-03-04 ENCOUNTER — Ambulatory Visit (INDEPENDENT_AMBULATORY_CARE_PROVIDER_SITE_OTHER): Payer: Medicare HMO | Admitting: *Deleted

## 2014-03-04 DIAGNOSIS — Z950 Presence of cardiac pacemaker: Secondary | ICD-10-CM

## 2014-03-04 LAB — MDC_IDC_ENUM_SESS_TYPE_REMOTE
Battery Voltage: 2.62 V
Brady Statistic AP VP Percent: 0 %
Brady Statistic AP VS Percent: 0 %
Brady Statistic AS VP Percent: 100 %
Brady Statistic AS VS Percent: 0 %
Brady Statistic RA Percent Paced: 0 %
Brady Statistic RV Percent Paced: 100 %
Date Time Interrogation Session: 20150527164655
HighPow Impedance: 285 Ohm
HighPow Impedance: 323 Ohm
HighPow Impedance: 50 Ohm
HighPow Impedance: 69 Ohm
Lead Channel Impedance Value: 342 Ohm
Lead Channel Impedance Value: 456 Ohm
Lead Channel Impedance Value: 513 Ohm
Lead Channel Impedance Value: 513 Ohm
Lead Channel Impedance Value: 722 Ohm
Lead Channel Pacing Threshold Amplitude: 0.875 V
Lead Channel Pacing Threshold Amplitude: 2.75 V
Lead Channel Pacing Threshold Pulse Width: 0.4 ms
Lead Channel Pacing Threshold Pulse Width: 1 ms
Lead Channel Sensing Intrinsic Amplitude: 3.5 mV
Lead Channel Sensing Intrinsic Amplitude: 3.5 mV
Lead Channel Sensing Intrinsic Amplitude: 7.625 mV
Lead Channel Sensing Intrinsic Amplitude: 7.625 mV
Lead Channel Setting Pacing Amplitude: 2 V
Lead Channel Setting Pacing Amplitude: 3.5 V
Lead Channel Setting Pacing Pulse Width: 0.4 ms
Lead Channel Setting Pacing Pulse Width: 1 ms
Lead Channel Setting Sensing Sensitivity: 0.3 mV
Zone Setting Detection Interval: 310 ms
Zone Setting Detection Interval: 350 ms
Zone Setting Detection Interval: 360 ms
Zone Setting Detection Interval: 400 ms

## 2014-03-04 NOTE — Progress Notes (Signed)
Remote ICD transmission.   

## 2014-03-04 NOTE — Telephone Encounter (Signed)
LMOVM reminding pt to send remote transmission.   

## 2014-03-23 ENCOUNTER — Telehealth: Payer: Self-pay | Admitting: Cardiovascular Disease

## 2014-03-23 ENCOUNTER — Telehealth: Payer: Self-pay | Admitting: *Deleted

## 2014-03-23 NOTE — Telephone Encounter (Signed)
RN spoke with patient. She stated she was eating breakfast and she heard an alarm sound and figured it was her device. She is not symptomatic. RN informed her that our pacer/device technicians will be notified and she will be contacted.

## 2014-03-23 NOTE — Telephone Encounter (Signed)
Spoke with patient, device at [facility].  Patient is scheduled 03/26/14 with Dr. Lovena Le to discuss change out.

## 2014-03-23 NOTE — Telephone Encounter (Signed)
Patient to send a transmission for review of alerts.

## 2014-03-26 ENCOUNTER — Ambulatory Visit (INDEPENDENT_AMBULATORY_CARE_PROVIDER_SITE_OTHER): Payer: Medicare HMO | Admitting: Internal Medicine

## 2014-03-26 ENCOUNTER — Encounter: Payer: Self-pay | Admitting: Internal Medicine

## 2014-03-26 VITALS — BP 161/75 | HR 72 | Ht 68.0 in | Wt 196.0 lb

## 2014-03-26 DIAGNOSIS — I5022 Chronic systolic (congestive) heart failure: Secondary | ICD-10-CM

## 2014-03-26 DIAGNOSIS — Z45018 Encounter for adjustment and management of other part of cardiac pacemaker: Secondary | ICD-10-CM

## 2014-03-26 DIAGNOSIS — I4901 Ventricular fibrillation: Secondary | ICD-10-CM

## 2014-03-26 DIAGNOSIS — Z95810 Presence of automatic (implantable) cardiac defibrillator: Secondary | ICD-10-CM

## 2014-03-26 DIAGNOSIS — Z950 Presence of cardiac pacemaker: Secondary | ICD-10-CM

## 2014-03-26 DIAGNOSIS — I442 Atrioventricular block, complete: Secondary | ICD-10-CM

## 2014-03-26 NOTE — Assessment & Plan Note (Signed)
Her symptoms are class 2-3. Will continue her current meds.

## 2014-03-26 NOTE — Assessment & Plan Note (Signed)
This occurred in the setting of sotalol. She has not had any recurrence.

## 2014-03-26 NOTE — Assessment & Plan Note (Signed)
Her BiV ICD is at [facility]. She will undergo generator change out in several weeks. I plan to obtain a 2D echo to evaluate her EF. If improved, would consider BiV PPM. Also, her pacing threshold is elevated in the LV. At the time of change out would consider placing a new LV lead.

## 2014-03-26 NOTE — Patient Instructions (Addendum)
Your physician has requested that you have an echocardiogram. Echocardiography is a painless test that uses sound waves to create images of your heart. It provides your doctor with information about the size and shape of your heart and how well your heart's chambers and valves are working. This procedure takes approximately one hour. There are no restrictions for this procedure.  After echo will decide how to proceed with device

## 2014-03-26 NOTE — Progress Notes (Signed)
    HPI [name] returns today for ongoing evaluation and management of her ICD. She is a very pleasant 80 yo woman who I initially met approx. 8 years ago when she was referred for BiV PM insertion and AV node ablation which were successfully performed. She has now had chronic atrial fibrillation. Her heart failure symptoms are class 2. She has not had any ICD shocks. She notes mild peripheral edema. Her biggest complaint is related to back pain. She has not had syncope. She has reached ERI on her ICD Allergies  Allergen Reactions  . Lisinopril   . Lyrica [Pregabalin]   . Morphine Nausea Only  . Neurontin [Gabapentin]   . Penicillins   . Tape Rash    TAPE     Current Outpatient Prescriptions  Medication Sig Dispense Refill  . aspirin 81 MG tablet Take 1 tablet by mouth daily.      . digoxin (LANOXIN) 0.125 MG tablet Take 0.125 mg by mouth daily.       . ezetimibe (ZETIA) 10 MG tablet Take 10 mg by mouth daily.      . furosemide (LASIX) 40 MG tablet Take 40 mg by mouth daily.      . glipiZIDE (GLUCOTROL) 5 MG tablet Take 5 mg by mouth 2 (two) times daily before a meal.      . Insulin Detemir (LEVEMIR FLEXPEN) 100 UNIT/ML Pen Inject 30 Units into the skin at bedtime.      . levothyroxine (SYNTHROID, LEVOTHROID) 50 MCG tablet Take 50 mcg by mouth daily.      . losartan (COZAAR) 50 MG tablet Take 50 mg by mouth daily.      . magnesium oxide (MAG-OX) 400 MG tablet Take 400 mg by mouth 3 (three) times daily.       . metFORMIN (GLUCOPHAGE) 1000 MG tablet Take 1 tablet by mouth 2 (two) times daily.      . Multiple Vitamins-Minerals (CENTRUM SILVER PO) Take 1 capsule by mouth daily.      . omega-3 acid ethyl esters (LOVAZA) 1 G capsule Take 1 capsule by mouth daily.      . oxyCODONE-acetaminophen (PERCOCET) 7.5-325 MG per tablet Take 1 tablet by mouth every 4 (four) hours as needed for pain.      . pantoprazole (PROTONIX) 40 MG tablet Take 40 mg by mouth 2 (two) times daily.      .  POTASSIUM GLUCONATE PO Take 1 tablet by mouth daily.      . pravastatin (PRAVACHOL) 20 MG tablet Take 20 mg by mouth at bedtime.      . rivaroxaban (XARELTO) 10 MG TABS tablet Take 10 mg by mouth daily.       No current facility-administered medications for this visit.     Past Medical History  Diagnosis Date  . Chronic a-fib     on xarelto; failed DCCV  . Biventricular ICD (implantable cardiac defibrillator) in place     BiV ICD for nonischemic cardiomyopathy; BiV responder  . Anemia     no GI bleeding; on iron  . VT (ventricular tachycardia) 9/11    polymorphic VT probably related to sotalol therapy  . Amiodarone pulmonary toxicity   . 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 Renal insufficiency, mild     05/01/12 Creat 1.47  . H/O echocardiogram 07/05/2010    EF 45-50%; aortic valve-mildly sclerotic; LA-mod dilaterd   . Hyperlipemia   . Diabetes   . H/O   cardiovascular stress test 08/04/2010    normal imaging; EF 61%  . LBBB (left bundle branch block)   . RAS (renal artery stenosis)     renal dopp (12/18/08)- Right RA-<60%; L RA stent- open; nl size and shape in both kidneys  . PVD (peripheral vascular disease)     L RA stent 1994    ROS:   All systems reviewed and negative except as noted in the HPI.   Past Surgical History  Procedure Laterality Date  . Cholecystectomy      Dr. Sanzenbacher in [place] and Dr. Misenheimer follows; surgical stricture post procedure with stent placement  . Brain meningioma excision  2011    L frontal meningioma at [facility]   . Biv icd genertaor change out  11/15/10    Medtronic Protecta [id number] serial #[id number]  . Av node ablation  06/08/2006    performed by Dr. G. Diamon Reddinger; due to Afib with RVR and tachy brady syndrome  . Biv icd placement  06/17/2007    Medtronic Concerta [id number]; RA lead-Med 5076/53cm, [id number]; RV lead- SJM 7121/65cm, [id number]; LV lead- Med 4194/88cm, [id number]  . Cardioversion  07/21/05    converted  to sinus brady  . Pv angio  1994    L Renal artery stent      Family History  Problem Relation Age of Onset  . Dementia Sister      History   Social History  . Marital Status: Married    Spouse Name: N/A    Number of Children: N/A  . Years of Education: N/A   Occupational History  . Not on file.   Social History Main Topics  . Smoking status: Never Smoker   . Smokeless tobacco: Not on file  . Alcohol Use: No  . Drug Use: Not on file  . Sexual Activity: Not on file   Other Topics Concern  . Not on file   Social History Narrative  . No narrative on file     BP 161/75  Pulse 72  Ht 5' 8" (1.727 m)  Wt 196 lb (88.905 kg)  BMI 29.81 kg/m2  Physical Exam:  Well appearing 80 yo woman, NAD HEENT: Unremarkable Neck:  No JVD, no thyromegally Back:  No CVA tenderness Lungs:  Clear with no wheezes, rales, or rhonchi HEART:  Regular rate rhythm, no murmurs, no rubs, no clicks Abd:  soft, positive bowel sounds, no organomegally, no rebound, no guarding Ext:  2 plus pulses, no edema, no cyanosis, no clubbing Skin:  No rashes no nodules Neuro:  CN II through XII intact, motor grossly intact  EKG - atrial fib with ventricular pacing  DEVICE  Normal device function.  See PaceArt for details. She is at ERI.  Assess/Plan: 

## 2014-03-31 ENCOUNTER — Encounter: Payer: Self-pay | Admitting: Cardiology

## 2014-04-02 ENCOUNTER — Ambulatory Visit (HOSPITAL_COMMUNITY): Payer: Medicare HMO | Attending: Internal Medicine | Admitting: Radiology

## 2014-04-02 ENCOUNTER — Encounter: Payer: Self-pay | Admitting: Internal Medicine

## 2014-04-02 ENCOUNTER — Encounter: Payer: Self-pay | Admitting: Cardiovascular Disease

## 2014-04-02 DIAGNOSIS — I442 Atrioventricular block, complete: Secondary | ICD-10-CM

## 2014-04-02 DIAGNOSIS — I4891 Unspecified atrial fibrillation: Secondary | ICD-10-CM | POA: Insufficient documentation

## 2014-04-02 DIAGNOSIS — I4901 Ventricular fibrillation: Secondary | ICD-10-CM

## 2014-04-02 NOTE — Progress Notes (Signed)
Echocardiogram performed.  

## 2014-04-06 ENCOUNTER — Ambulatory Visit (INDEPENDENT_AMBULATORY_CARE_PROVIDER_SITE_OTHER): Payer: Medicare HMO | Admitting: *Deleted

## 2014-04-06 ENCOUNTER — Telehealth: Payer: Self-pay | Admitting: Cardiology

## 2014-04-06 DIAGNOSIS — Z45018 Encounter for adjustment and management of other part of cardiac pacemaker: Secondary | ICD-10-CM

## 2014-04-06 LAB — MDC_IDC_ENUM_SESS_TYPE_REMOTE
Battery Voltage: 2.62 V
Brady Statistic AP VP Percent: 0 %
Brady Statistic AP VS Percent: 0 %
Brady Statistic AS VP Percent: 0 %
Brady Statistic AS VS Percent: 0 %
Brady Statistic RA Percent Paced: 0 %
Brady Statistic RV Percent Paced: 0 %
Date Time Interrogation Session: 20150629160830
HighPow Impedance: 266 Ohm
HighPow Impedance: 285 Ohm
HighPow Impedance: 49 Ohm
HighPow Impedance: 67 Ohm
Lead Channel Impedance Value: 323 Ohm
Lead Channel Impedance Value: 456 Ohm
Lead Channel Impedance Value: 494 Ohm
Lead Channel Impedance Value: 551 Ohm
Lead Channel Impedance Value: 722 Ohm
Lead Channel Pacing Threshold Amplitude: 0.875 V
Lead Channel Pacing Threshold Amplitude: 2.75 V
Lead Channel Pacing Threshold Pulse Width: 0.4 ms
Lead Channel Pacing Threshold Pulse Width: 1 ms
Lead Channel Sensing Intrinsic Amplitude: 3.5 mV
Lead Channel Sensing Intrinsic Amplitude: 3.5 mV
Lead Channel Sensing Intrinsic Amplitude: 6.75 mV
Lead Channel Sensing Intrinsic Amplitude: 6.75 mV
Lead Channel Setting Pacing Amplitude: 2 V
Lead Channel Setting Pacing Amplitude: 3.25 V
Lead Channel Setting Pacing Pulse Width: 0.4 ms
Lead Channel Setting Pacing Pulse Width: 1 ms
Lead Channel Setting Sensing Sensitivity: 0.3 mV
Zone Setting Detection Interval: 310 ms
Zone Setting Detection Interval: 350 ms
Zone Setting Detection Interval: 360 ms
Zone Setting Detection Interval: 400 ms

## 2014-04-06 NOTE — Progress Notes (Signed)
Remote ICD transmission.   

## 2014-04-06 NOTE — Telephone Encounter (Signed)
Spoke with pt and reminded pt of remote transmission that is due today. Pt verbalized understanding.   

## 2014-04-09 ENCOUNTER — Other Ambulatory Visit: Payer: Self-pay | Admitting: *Deleted

## 2014-04-09 ENCOUNTER — Encounter: Payer: Self-pay | Admitting: *Deleted

## 2014-04-09 DIAGNOSIS — Z4502 Encounter for adjustment and management of automatic implantable cardiac defibrillator: Secondary | ICD-10-CM

## 2014-04-10 ENCOUNTER — Encounter (HOSPITAL_COMMUNITY): Payer: Self-pay | Admitting: Pharmacy Technician

## 2014-04-13 ENCOUNTER — Other Ambulatory Visit: Payer: Self-pay | Admitting: Orthopaedic Surgery

## 2014-04-13 ENCOUNTER — Other Ambulatory Visit (INDEPENDENT_AMBULATORY_CARE_PROVIDER_SITE_OTHER): Payer: Medicare HMO

## 2014-04-13 DIAGNOSIS — Z4502 Encounter for adjustment and management of automatic implantable cardiac defibrillator: Secondary | ICD-10-CM

## 2014-04-13 DIAGNOSIS — M4726 Other spondylosis with radiculopathy, lumbar region: Secondary | ICD-10-CM

## 2014-04-13 LAB — CBC WITH DIFFERENTIAL/PLATELET
Basophils Absolute: 0 10*3/uL (ref 0.0–0.1)
Basophils Relative: 0.2 % (ref 0.0–3.0)
Eosinophils Absolute: 0.2 10*3/uL (ref 0.0–0.7)
Eosinophils Relative: 2.8 % (ref 0.0–5.0)
HCT: 32.6 % — ABNORMAL LOW (ref 36.0–46.0)
Hemoglobin: 10.6 g/dL — ABNORMAL LOW (ref 12.0–15.0)
Lymphocytes Relative: 21.4 % (ref 12.0–46.0)
Lymphs Abs: 1.5 10*3/uL (ref 0.7–4.0)
MCHC: 32.4 g/dL (ref 30.0–36.0)
MCV: 78.4 fl (ref 78.0–100.0)
Monocytes Absolute: 0.4 10*3/uL (ref 0.1–1.0)
Monocytes Relative: 5.8 % (ref 3.0–12.0)
Neutro Abs: 4.9 10*3/uL (ref 1.4–7.7)
Neutrophils Relative %: 69.8 % (ref 43.0–77.0)
Platelets: 328 10*3/uL (ref 150.0–400.0)
RBC: 4.15 Mil/uL (ref 3.87–5.11)
RDW: 17.6 % — ABNORMAL HIGH (ref 11.5–15.5)
WBC: 7 10*3/uL (ref 4.0–10.5)

## 2014-04-13 LAB — BASIC METABOLIC PANEL
BUN: 24 mg/dL — ABNORMAL HIGH (ref 6–23)
CO2: 29 mEq/L (ref 19–32)
Calcium: 9.8 mg/dL (ref 8.4–10.5)
Chloride: 101 mEq/L (ref 96–112)
Creatinine, Ser: 1.5 mg/dL — ABNORMAL HIGH (ref 0.4–1.2)
GFR: 35.46 mL/min — ABNORMAL LOW (ref >60.00)
Glucose, Bld: 184 mg/dL — ABNORMAL HIGH (ref 70–99)
Potassium: 4.5 mEq/L (ref 3.5–5.1)
Sodium: 139 mEq/L (ref 135–145)

## 2014-04-14 ENCOUNTER — Telehealth: Payer: Self-pay | Admitting: *Deleted

## 2014-04-14 NOTE — Telephone Encounter (Signed)
Called patient to discuss upcoming device generator change.  Reviewed instructions with patient (as she was in office yesterday for lab work and didn't get letter of instructions) Wound check scheduled for 7/23. Patient verbalized understanding and agreeable to plan.

## 2014-04-15 ENCOUNTER — Encounter: Payer: Self-pay | Admitting: Cardiology

## 2014-04-19 MED ORDER — VANCOMYCIN HCL IN DEXTROSE 1-5 GM/200ML-% IV SOLN
1000.0000 mg | INTRAVENOUS | Status: DC
  Filled 2014-04-19: qty 200

## 2014-04-19 MED ORDER — SODIUM CHLORIDE 0.9 % IR SOLN
80.0000 mg | Status: DC
  Filled 2014-04-19: qty 2

## 2014-04-19 MED ORDER — CHLORHEXIDINE GLUCONATE 4 % EX LIQD
60.0000 mL | Freq: Once | CUTANEOUS | Status: DC
  Filled 2014-04-19: qty 60

## 2014-04-19 MED ORDER — SODIUM CHLORIDE 0.9 % IV SOLN
INTRAVENOUS | Status: DC
  Administered 2014-04-20: 09:00:00 via INTRAVENOUS

## 2014-04-20 ENCOUNTER — Ambulatory Visit (HOSPITAL_COMMUNITY)
Admission: RE | Admit: 2014-04-20 | Discharge: 2014-04-20 | Disposition: A | Discharge status: Home / Self Care | Payer: Medicare HMO | Source: Ambulatory Visit | Attending: Internal Medicine | Admitting: Internal Medicine

## 2014-04-20 ENCOUNTER — Ambulatory Visit (HOSPITAL_COMMUNITY): Payer: Medicare HMO

## 2014-04-20 ENCOUNTER — Encounter (HOSPITAL_COMMUNITY)
Admission: RE | Disposition: A | Discharge status: Home / Self Care | Payer: Self-pay | Source: Ambulatory Visit | Attending: Internal Medicine

## 2014-04-20 DIAGNOSIS — Z7901 Long term (current) use of anticoagulants: Secondary | ICD-10-CM | POA: Insufficient documentation

## 2014-04-20 DIAGNOSIS — M549 Dorsalgia, unspecified: Secondary | ICD-10-CM | POA: Insufficient documentation

## 2014-04-20 DIAGNOSIS — Z538 Procedure and treatment not carried out for other reasons: Secondary | ICD-10-CM | POA: Insufficient documentation

## 2014-04-20 DIAGNOSIS — Z794 Long term (current) use of insulin: Secondary | ICD-10-CM | POA: Insufficient documentation

## 2014-04-20 DIAGNOSIS — I4891 Unspecified atrial fibrillation: Secondary | ICD-10-CM | POA: Insufficient documentation

## 2014-04-20 DIAGNOSIS — I447 Left bundle-branch block, unspecified: Secondary | ICD-10-CM | POA: Insufficient documentation

## 2014-04-20 DIAGNOSIS — I428 Other cardiomyopathies: Secondary | ICD-10-CM | POA: Insufficient documentation

## 2014-04-20 DIAGNOSIS — I5022 Chronic systolic (congestive) heart failure: Secondary | ICD-10-CM | POA: Insufficient documentation

## 2014-04-20 DIAGNOSIS — Z4502 Encounter for adjustment and management of automatic implantable cardiac defibrillator: Secondary | ICD-10-CM | POA: Insufficient documentation

## 2014-04-20 DIAGNOSIS — Z7982 Long term (current) use of aspirin: Secondary | ICD-10-CM | POA: Insufficient documentation

## 2014-04-20 DIAGNOSIS — Z79899 Other long term (current) drug therapy: Secondary | ICD-10-CM | POA: Insufficient documentation

## 2014-04-20 DIAGNOSIS — E119 Type 2 diabetes mellitus without complications: Secondary | ICD-10-CM | POA: Insufficient documentation

## 2014-04-20 DIAGNOSIS — E785 Hyperlipidemia, unspecified: Secondary | ICD-10-CM | POA: Insufficient documentation

## 2014-04-20 DIAGNOSIS — I509 Heart failure, unspecified: Secondary | ICD-10-CM | POA: Insufficient documentation

## 2014-04-20 DIAGNOSIS — I442 Atrioventricular block, complete: Secondary | ICD-10-CM | POA: Insufficient documentation

## 2014-04-20 HISTORY — PX: BIV PACEMAKER GENERATOR CHANGE OUT: SHX5746

## 2014-04-20 LAB — GLUCOSE, CAPILLARY
Glucose-Capillary: 124 mg/dL — ABNORMAL HIGH (ref 70–99)
Glucose-Capillary: 105 mg/dL — ABNORMAL HIGH (ref 70–99)

## 2014-04-20 LAB — SURGICAL PCR SCREEN
MRSA, PCR: NEGATIVE
Staphylococcus aureus: NEGATIVE

## 2014-04-20 SURGERY — BIV PACEMAKER GENERATOR CHANGE OUT
Anesthesia: LOCAL

## 2014-04-20 MED ORDER — ACETAMINOPHEN 325 MG PO TABS
325.0000 mg | ORAL_TABLET | ORAL | Status: DC | PRN
  Filled 2014-04-20: qty 2

## 2014-04-20 MED ORDER — MUPIROCIN 2 % EX OINT
1.0000 "application " | TOPICAL_OINTMENT | Freq: Once | CUTANEOUS | Status: DC
  Filled 2014-04-20: qty 22

## 2014-04-20 MED ORDER — MIDAZOLAM HCL 5 MG/5ML IJ SOLN
INTRAMUSCULAR | Status: AC
  Filled 2014-04-20: qty 5

## 2014-04-20 MED ORDER — LIDOCAINE HCL (PF) 1 % IJ SOLN
INTRAMUSCULAR | Status: AC
  Filled 2014-04-20: qty 30

## 2014-04-20 MED ORDER — FENTANYL CITRATE 0.05 MG/ML IJ SOLN
INTRAMUSCULAR | Status: AC
  Filled 2014-04-20: qty 2

## 2014-04-20 MED ORDER — ONDANSETRON HCL 4 MG/2ML IJ SOLN: 4.0000 mg | Freq: Four times a day (QID) | INTRAMUSCULAR | Status: DC | PRN

## 2014-04-20 MED ORDER — MUPIROCIN 2 % EX OINT
TOPICAL_OINTMENT | CUTANEOUS | Status: AC
  Administered 2014-04-20: 1
  Filled 2014-04-20: qty 22

## 2014-04-20 NOTE — Interval H&P Note (Signed)
History and Physical Interval Note:  04/20/2014 8:40 AM  [name]  has presented today for surgery, with the diagnosis of ICD EOL  The various methods of treatment have been discussed with the patient and family. After consideration of risks, benefits and other options for treatment, the patient has consented to  Procedure(s): BIV PACEMAKER GENERATOR CHANGE OUT (N/A) with attempted insertion of a new LV lead as a surgical intervention .  The patient's history has been reviewed, patient examined, no change in status, stable for surgery.  I have reviewed the patient's chart and labs.  Questions were answered to the patient's satisfaction.     Mikle Bosworth.D.

## 2014-04-20 NOTE — Interval H&P Note (Signed)
History and Physical Interval Note:  04/20/2014 9:12 AM  [name]  has presented today for surgery, with the diagnosis of ICD EOL  The various methods of treatment have been discussed with the patient and family. After consideration of risks, benefits and other options for treatment, the patient has consented to  Procedure(s): BIV PACEMAKER GENERATOR CHANGE OUT (N/A) with insertion of a new LV lead as a surgical intervention .  The patient's history has been reviewed, patient examined, no change in status, stable for surgery.  I have reviewed the patient's chart and labs.  Questions were answered to the patient's satisfaction.     Mikle Bosworth.D.

## 2014-04-20 NOTE — Progress Notes (Signed)
Called Dr. Lovena Le for CXR results. He stated CXR good and pt can go home.

## 2014-04-20 NOTE — Discharge Instructions (Signed)
Pacemaker Battery Change, Care After Refer to this sheet in the next few weeks. These instructions provide you with information on caring for yourself after your procedure. Your health care provider may also give you more specific instructions. Your treatment has been planned according to current medical practices, but problems sometimes occur. Call your health care provider if you have any problems or questions after your procedure. WHAT TO EXPECT AFTER THE PROCEDURE After your procedure, it is typical to have the following sensations:  Soreness at the pacemaker site. HOME CARE INSTRUCTIONS   Keep the incision clean and dry.  Unless advised otherwise, you may shower beginning 48 hours after your procedure.  For the first week after the replacement, avoid stretching motions that pull at the incision site, and avoid heavy exercise with the arm that is on the same side as the incision.  Only take over-the-counter or prescription medicines for pain, discomfort, or fever as directed by your health care provider.  Your health care provider will tell you when you will need to next test your pacemaker by telephone or when to return to the office for follow-up for removal of stitches. SEEK MEDICAL CARE IF:   You have pain at the incision site that is not relieved by over-the-counter or prescription medicine.  There is drainage or pus from the incision site.  There is swelling larger than a lime at the incision site.  You develop red streaking that extends above or below the incision site.  You feel brief, intermittent palpitations, lightheadedness, or any symptoms that you feel might be related to your heart. SEEK IMMEDIATE MEDICAL CARE IF:   You experience chest pain that is different than the pain at the pacemaker site.  You experience shortness of breath.  You have palpitations or irregular heartbeat.  You have lightheadedness that does not go away quickly.  You faint.  You have  pain that gets worse and is not relieved by medicine. MAKE SURE YOU:   Understand these instructions.  Will watch your condition.  Will get help right away if you are not doing well or get worse. Document Released: 07/16/2013 Document Revised: 09/30/2013 Document Reviewed: 07/16/2013 [facility] Patient Information 2015 [place], [place]. This information is not intended to replace advice given to you by your health care provider. Make sure you discuss any questions you have with your health care provider.

## 2014-04-20 NOTE — H&P (View-Only) (Signed)
HPI [name] returns today for ongoing evaluation and management of her ICD. She is a very pleasant 80 yo woman who I initially met approx. 8 years ago when she was referred for BiV PM insertion and AV node ablation which were successfully performed. She has now had chronic atrial fibrillation. Her heart failure symptoms are class 2. She has not had any ICD shocks. She notes mild peripheral edema. Her biggest complaint is related to back pain. She has not had syncope. She has reached ERI on her ICD Allergies  Allergen Reactions  . Lisinopril   . Lyrica [Pregabalin]   . Morphine Nausea Only  . Neurontin [Gabapentin]   . Penicillins   . Tape Rash    TAPE     Current Outpatient Prescriptions  Medication Sig Dispense Refill  . aspirin 81 MG tablet Take 1 tablet by mouth daily.      . digoxin (LANOXIN) 0.125 MG tablet Take 0.125 mg by mouth daily.       Marland Kitchen ezetimibe (ZETIA) 10 MG tablet Take 10 mg by mouth daily.      . furosemide (LASIX) 40 MG tablet Take 40 mg by mouth daily.      Marland Kitchen glipiZIDE (GLUCOTROL) 5 MG tablet Take 5 mg by mouth 2 (two) times daily before a meal.      . Insulin Detemir (LEVEMIR FLEXPEN) 100 UNIT/ML Pen Inject 30 Units into the skin at bedtime.      Marland Kitchen levothyroxine (SYNTHROID, LEVOTHROID) 50 MCG tablet Take 50 mcg by mouth daily.      Marland Kitchen losartan (COZAAR) 50 MG tablet Take 50 mg by mouth daily.      . magnesium oxide (MAG-OX) 400 MG tablet Take 400 mg by mouth 3 (three) times daily.       . metFORMIN (GLUCOPHAGE) 1000 MG tablet Take 1 tablet by mouth 2 (two) times daily.      . Multiple Vitamins-Minerals (CENTRUM SILVER PO) Take 1 capsule by mouth daily.      Marland Kitchen omega-3 acid ethyl esters (LOVAZA) 1 G capsule Take 1 capsule by mouth daily.      Marland Kitchen oxyCODONE-acetaminophen (PERCOCET) 7.5-325 MG per tablet Take 1 tablet by mouth every 4 (four) hours as needed for pain.      . pantoprazole (PROTONIX) 40 MG tablet Take 40 mg by mouth 2 (two) times daily.      Marland Kitchen  POTASSIUM GLUCONATE PO Take 1 tablet by mouth daily.      . pravastatin (PRAVACHOL) 20 MG tablet Take 20 mg by mouth at bedtime.      . rivaroxaban (XARELTO) 10 MG TABS tablet Take 10 mg by mouth daily.       No current facility-administered medications for this visit.     Past Medical History  Diagnosis Date  . Chronic a-fib     on xarelto; failed DCCV  . Biventricular ICD (implantable cardiac defibrillator) in place     BiV ICD for nonischemic cardiomyopathy; BiV responder  . Anemia     no GI bleeding; on iron  . VT (ventricular tachycardia) 9/11    polymorphic VT probably related to sotalol therapy  . Amiodarone pulmonary toxicity   . 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 Renal insufficiency, mild     05/01/12 Creat 1.47  . H/O echocardiogram 07/05/2010    EF 45-50%; aortic valve-mildly sclerotic; LA-mod dilaterd   . Hyperlipemia   . Diabetes   . H/O  cardiovascular stress test 08/04/2010    normal imaging; EF 61%  . LBBB (left bundle branch block)   . RAS (renal artery stenosis)     renal dopp (12/18/08)- Right RA-<60%; L RA stent- open; nl size and shape in both kidneys  . PVD (peripheral vascular disease)     L RA stent 1994    ROS:   All systems reviewed and negative except as noted in the HPI.   Past Surgical History  Procedure Laterality Date  . Cholecystectomy      Dr. Sherald Hess in [place] and Dr. Lyda Jester follows; surgical stricture post procedure with stent placement  . Brain meningioma excision  2011    L frontal meningioma at [facility]   . Biv icd genertaor change out  11/15/10    Medtronic Protecta [id number] serial [id number]  . Av node ablation  06/08/2006    performed by Dr. Beckie Salts; due to Afib with RVR and tachy brady syndrome  . Biv icd placement  06/17/2007    Medtronic Concerta [id number]; RA lead-Med 5076/53cm, [id number]; RV lead- SJM 7121/65cm, [id number]; LV lead- Med 4194/88cm, [id number]  . Cardioversion  07/21/05    converted  to sinus brady  . Pv angio  1994    L Renal artery stent      Family History  Problem Relation Age of Onset  . Dementia Sister      History   Social History  . Marital Status: Married    Spouse Name: N/A    Number of Children: N/A  . Years of Education: N/A   Occupational History  . Not on file.   Social History Main Topics  . Smoking status: Never Smoker   . Smokeless tobacco: Not on file  . Alcohol Use: No  . Drug Use: Not on file  . Sexual Activity: Not on file   Other Topics Concern  . Not on file   Social History Narrative  . No narrative on file     BP 161/75  Pulse 72  Ht _0  (1.727 m)  Wt 196 lb (88.905 kg)  BMI 29.81 kg/m2  Physical Exam:  Well appearing 80 yo woman, NAD HEENT: Unremarkable Neck:  No JVD, no thyromegally Back:  No CVA tenderness Lungs:  Clear with no wheezes, rales, or rhonchi HEART:  Regular rate rhythm, no murmurs, no rubs, no clicks Abd:  soft, positive bowel sounds, no organomegally, no rebound, no guarding Ext:  2 plus pulses, no edema, no cyanosis, no clubbing Skin:  No rashes no nodules Neuro:  CN II through XII intact, motor grossly intact  EKG - atrial fib with ventricular pacing  DEVICE  Normal device function.  See PaceArt for details. She is at [facility].  Assess/Plan:

## 2014-04-21 ENCOUNTER — Encounter: Payer: Self-pay | Admitting: Internal Medicine

## 2014-04-21 NOTE — CV Procedure (Signed)
Electrophysiology procedure note  Procedure: Attempted insertion of a new left ventricular pacing lead, removal of a previous implanted ICD which had reached elective replacement, insertion of a new ICD, utilizing coronary sinus and left upper extremity venography.  Indication: Long-standing nonischemic cardiomyopathy, chronic atrial fibrillation, chronic class II systolic heart failure with severe left ventricular dysfunction, EF 30%, with normalization of her left ventricular function, complete heart block, status post ICD implantation with a chronically elevated left ventricular pacing lead threshold, with normalization of the patient's left ventricular function.  Description of the procedure: After informed consent was obtained, the patient was taken to the diagnostic electrophysiology laboratory in the fasting state. Our plan was to remove the patient's old biventricular ICD, insert a new left ventricular pacing lead, and a new biventricular pacemaker. 10 cc of contrast was injected into the left upper extremity venous system, demonstrating a patent left subclavian vein. 30 cc of lidocaine was infiltrated into the ICD insertion site. A 6 cm incision was carried out. Electrocautery was utilized to dissect down to the fascial plane. The left subclavian vein was punctured. A 9 French sheath was advanced into the vein. A 7 [place] guiding catheter along with a hexapolar EP catheter was inserted into the right atrium through the 9 [place] sheath. The coronary sinus was cannulated. Venography of the coronary sinus was carried out. This demonstrated a small lateral vein, a subtotally occluded posterior lateral vein where the previously implanted left ventricular lead had been placed, and a small middle cardiac vein. The lateral vein was cannulated with an angioplasty guidewire. The vein was tortuous, but the guidewire was advanced approximately 2/3 base to apex. The Medtronic model [id number] left ventricular pacing  lead was advanced over the guidewire but could not be advanced more than 2 cm into the lateral vein. The lead and guidewire were retracted and attempts made to cannulate the subtotally occluded posterolateral vein were unsuccessful. The middle cardiac vein was then cannulated with the angioplasty guidewire but was small and the angioplasty guidewire could not be advanced more than halfway from base to apex, and not very lateral. At this point it became clear that a new left ventricular lead could not be placed endovascularly.   Electrocautery was then utilized to enter the ICD pocket. The patient's left ventricular pacing configuration was from the left ventricular ring to the ICD coil. Pacing the left ventricular tip electrode resulted in diaphragmatic stimulation. Bipolar pacing between the tip and proximal left ventricular electrode resulted in an elevated pacing threshold of approximately 4.5 V. At this point, a decision had to be made. Pacing the left ventricular proximal electrode and the defibrillator coil had a threshold of less than 2.7 V, much better than any other configuration. Unfortunately placement of a biventricular pacemaker would not allow for this pacing configuration. The ICD coil could not be utilized in a biventricular pacemaker. Although the patient had normalization of her left ventricular function, and did not have an indication for ICD implantation, an ICD was required so that we could continue to pace biventricularly with out an unacceptably elevated left ventricular pacing threshold. The Medtronic biventricular ICD, serial [id number] was connected to the old left ventricular pacing lead and the ICD lead, and placed back in the subcutaneous pocket. The pocket was irrigated with antibiotic irrigation. The incision was closed with 2 layers of Vicryl suture. Benzoin and Steri-Strips were painted on the skin. The patient was returned to her room in satisfactory  condition.  Complications: There were no immediate  procedure complications  Conclusion: Successful removal of a previously implanted biventricular ICD which had reached elective replacement, unsuccessful insertion of a new left ventricular pacing lead secondary to the anatomic limitations as described above, resulting in the requirement that the patient had a new biventricular ICD placed to facilitate ongoing left ventricular pacing at an acceptably high pacing threshold. A biventricular pacemaker could not be utilized in this procedure because of the patient's requirement to pace the proximal left ventricular electrode and the ICD coil. All other pacing configurations resulted in either diaphragmatic stimulation or an unacceptably elevated left ventricular pacing threshold.  Cristopher Peru, M.D.

## 2014-04-28 NOTE — H&P (Signed)
  ICD Criteria  Current LVEF:45% ;Obtained < 1 month ago.  NYHA Functional Classification: Class II  Heart Failure History:  Yes, Duration of heart failure since onset is > 9 months  Non-Ischemic Dilated Cardiomyopathy History:  Yes, timeframe is > 9 months  Atrial Fibrillation/Atrial Flutter:  Yes, A-Fib/A-Flutter type: Permanent (>1 year).  Ventricular Tachycardia History:  Yes, Hemodynamic instability present, VT Type:  SVT - Monomorphic.  Cardiac Arrest History:  No  History of Syndromes with Risk of Sudden Death:  No.  Previous ICD:  Yes, ICD Type:  CRT-D, Reason for ICD:  Secondary, reason for secondary prevention:  Ventricular Tachycardia  Electrophysiology Study: No.  Prior MI: No.  PPM: No.  OSA:  No  Patient Life Expectancy of >=1 year: Yes.  Anticoagulation Therapy:  Patient is on anticoagulation therapy, anticoagulation was held prior to procedure.   Beta Blocker Therapy:  No, Reason not on Beta Blocker therapy: hypotension  Ace Inhibitor/ARB Therapy:  Yes.

## 2014-04-30 ENCOUNTER — Ambulatory Visit (INDEPENDENT_AMBULATORY_CARE_PROVIDER_SITE_OTHER): Payer: Medicare HMO | Admitting: *Deleted

## 2014-04-30 DIAGNOSIS — I5022 Chronic systolic (congestive) heart failure: Secondary | ICD-10-CM

## 2014-04-30 DIAGNOSIS — I428 Other cardiomyopathies: Secondary | ICD-10-CM

## 2014-04-30 DIAGNOSIS — I482 Chronic atrial fibrillation, unspecified: Secondary | ICD-10-CM

## 2014-04-30 DIAGNOSIS — I4891 Unspecified atrial fibrillation: Secondary | ICD-10-CM

## 2014-04-30 DIAGNOSIS — I442 Atrioventricular block, complete: Secondary | ICD-10-CM

## 2014-04-30 DIAGNOSIS — I4901 Ventricular fibrillation: Secondary | ICD-10-CM

## 2014-04-30 LAB — MDC_IDC_ENUM_SESS_TYPE_INCLINIC
Battery Remaining Longevity: 58 mo
Battery Voltage: 3.11 V
Brady Statistic AP VP Percent: 0 %
Brady Statistic AP VS Percent: 0 %
Brady Statistic AS VP Percent: 98.7 %
Brady Statistic AS VS Percent: 1.3 %
Brady Statistic RA Percent Paced: 0 %
Brady Statistic RV Percent Paced: 98.9 %
Date Time Interrogation Session: 20150723122225
HighPow Impedance: 247 Ohm
HighPow Impedance: 44 Ohm
HighPow Impedance: 63 Ohm
Lead Channel Impedance Value: 361 Ohm
Lead Channel Impedance Value: 475 Ohm
Lead Channel Impedance Value: 513 Ohm
Lead Channel Impedance Value: 589 Ohm
Lead Channel Impedance Value: 779 Ohm
Lead Channel Pacing Threshold Amplitude: 0.875 V
Lead Channel Pacing Threshold Amplitude: 2.5 V
Lead Channel Pacing Threshold Pulse Width: 0.4 ms
Lead Channel Pacing Threshold Pulse Width: 1 ms
Lead Channel Sensing Intrinsic Amplitude: 0.25 mV
Lead Channel Sensing Intrinsic Amplitude: 0.375 mV
Lead Channel Sensing Intrinsic Amplitude: 6 mV
Lead Channel Sensing Intrinsic Amplitude: 7.375 mV
Lead Channel Setting Pacing Amplitude: 2 V
Lead Channel Setting Pacing Amplitude: 3.5 V
Lead Channel Setting Pacing Pulse Width: 0.4 ms
Lead Channel Setting Pacing Pulse Width: 1 ms
Lead Channel Setting Sensing Sensitivity: 0.3 mV
Zone Setting Detection Interval: 300 ms
Zone Setting Detection Interval: 360 ms
Zone Setting Detection Interval: 400 ms
Zone Setting Detection Interval: 450 ms

## 2014-04-30 NOTE — Progress Notes (Signed)
Wound check appointment. Steri-strips removed. Wound without redness or edema. Incision edges approximated, wound well healed. Normal device function. Thresholds, sensing, and impedances consistent with implant measurements. Device programmed at appropriate safety margins. Histogram distribution appropriate for patient and level of activity. Permanent AF + Xarelto. No ventricular arrhythmias noted. Patient educated about wound care, arm mobility, lifting restrictions, shock plan. ROV in 3 months with GT.

## 2014-05-02 ENCOUNTER — Encounter (HOSPITAL_COMMUNITY): Payer: Self-pay | Admitting: *Deleted

## 2014-06-04 ENCOUNTER — Encounter: Payer: Self-pay | Admitting: Internal Medicine

## 2014-07-29 ENCOUNTER — Encounter: Payer: Self-pay | Admitting: Internal Medicine

## 2014-07-29 ENCOUNTER — Ambulatory Visit (INDEPENDENT_AMBULATORY_CARE_PROVIDER_SITE_OTHER): Payer: Medicare HMO | Admitting: Internal Medicine

## 2014-07-29 VITALS — BP 152/60 | HR 76 | Ht 68.0 in | Wt 192.6 lb

## 2014-07-29 DIAGNOSIS — I482 Chronic atrial fibrillation, unspecified: Secondary | ICD-10-CM

## 2014-07-29 DIAGNOSIS — I4901 Ventricular fibrillation: Secondary | ICD-10-CM

## 2014-07-29 DIAGNOSIS — Z4502 Encounter for adjustment and management of automatic implantable cardiac defibrillator: Secondary | ICD-10-CM

## 2014-07-29 DIAGNOSIS — I5022 Chronic systolic (congestive) heart failure: Secondary | ICD-10-CM

## 2014-07-29 DIAGNOSIS — Z95810 Presence of automatic (implantable) cardiac defibrillator: Secondary | ICD-10-CM

## 2014-07-29 LAB — MDC_IDC_ENUM_SESS_TYPE_INCLINIC
Battery Remaining Longevity: 63 mo
Battery Voltage: 2.99 V
Brady Statistic AP VP Percent: 0 %
Brady Statistic AP VS Percent: 0 %
Brady Statistic AS VP Percent: 98.83 %
Brady Statistic AS VS Percent: 1.17 %
Brady Statistic RA Percent Paced: 0 %
Brady Statistic RV Percent Paced: 99 %
Date Time Interrogation Session: 20151021143253
HighPow Impedance: 304 Ohm
HighPow Impedance: 47 Ohm
HighPow Impedance: 65 Ohm
Lead Channel Impedance Value: 399 Ohm
Lead Channel Impedance Value: 456 Ohm
Lead Channel Impedance Value: 513 Ohm
Lead Channel Impedance Value: 513 Ohm
Lead Channel Impedance Value: 722 Ohm
Lead Channel Pacing Threshold Amplitude: 0.75 V
Lead Channel Pacing Threshold Amplitude: 3 V
Lead Channel Pacing Threshold Pulse Width: 0.4 ms
Lead Channel Pacing Threshold Pulse Width: 1 ms
Lead Channel Sensing Intrinsic Amplitude: 0.25 mV
Lead Channel Sensing Intrinsic Amplitude: 0.375 mV
Lead Channel Sensing Intrinsic Amplitude: 7.25 mV
Lead Channel Sensing Intrinsic Amplitude: 8.125 mV
Lead Channel Setting Pacing Amplitude: 2 V
Lead Channel Setting Pacing Amplitude: 3.25 V
Lead Channel Setting Pacing Pulse Width: 0.4 ms
Lead Channel Setting Pacing Pulse Width: 1 ms
Lead Channel Setting Sensing Sensitivity: 0.3 mV
Zone Setting Detection Interval: 300 ms
Zone Setting Detection Interval: 360 ms
Zone Setting Detection Interval: 400 ms
Zone Setting Detection Interval: 450 ms

## 2014-07-29 NOTE — Assessment & Plan Note (Signed)
Her symptoms remain class II. No change in medications. She will maintain a low-sodium diet.

## 2014-07-29 NOTE — Patient Instructions (Signed)
Your physician recommends that you continue on your current medications as directed. Please refer to the Current Medication list given to you today.  Remote monitoring is used to monitor your Pacemaker of ICD from home. This monitoring reduces the number of office visits required to check your device to one time per year. It allows [name] to keep an eye on the functioning of your device to ensure it is working properly. You are scheduled for a device check from home on 11/02/13. You may send your transmission at any time that day. If you have a wireless device, the transmission will be sent automatically. After your physician reviews your transmission, you will receive a postcard with your next transmission date.  Your physician wants you to follow-up in: 9 months with Dr. Lovena Le.  You will receive a reminder letter in the mail two months in advance. If you don't receive a letter, please call our office to schedule the follow-up appointment.

## 2014-07-29 NOTE — Assessment & Plan Note (Signed)
She has had no recurrent ventricular arrhythmias. No change in medical therapy.

## 2014-07-29 NOTE — Assessment & Plan Note (Signed)
Her Medtronic biventricular ICD is working normally. We'll plan to recheck in several months.

## 2014-07-29 NOTE — Progress Notes (Signed)
HPI [name] returns today for ongoing evaluation and management of her ICD. She is a very pleasant 80 yo woman who I initially met approx. 8 years ago when she was referred for BiV PM insertion and AV node ablation which were successfully performed. She has now had chronic atrial fibrillation. Her heart failure symptoms are class 2. She has not had any ICD shocks. She notes mild peripheral edema. Her biggest complaint is related to back pain. She has not had syncope. She had reached ERI on her ICD and underwent generator change. We'll plan to downgrade the patient to a biventricular pacemaker, however left ventricular pacing can only be carried out and accomplished when utilizing the right ventricular shocking coil as part of the pacing circuit. In addition, the patient had a history of polymorphic ventricular tachycardia. Since her device was placed she has been stable. She is been quite concerned about her son who was hospitalized with swelling on the brain in conjunction with prior brain cancer. Allergies  Allergen Reactions  . Lyrica [Pregabalin]     Swelling   . Neurontin [Gabapentin]     Swelling   . Morphine Nausea Only  . Lisinopril     Rash   . Penicillins     Rash   . Tape Rash    TAPE     Current Outpatient Prescriptions  Medication Sig Dispense Refill  . aspirin 81 MG tablet Take 81 mg by mouth daily.       . digoxin (LANOXIN) 0.125 MG tablet Take 0.125 mg by mouth daily.       Marland Kitchen ezetimibe (ZETIA) 10 MG tablet Take 10 mg by mouth daily.      . furosemide (LASIX) 40 MG tablet Take 40 mg by mouth daily.      Marland Kitchen glipiZIDE (GLUCOTROL) 5 MG tablet Take 5 mg by mouth 2 (two) times daily before a meal.      . Insulin Detemir (LEVEMIR FLEXPEN) 100 UNIT/ML Pen Inject 30 Units into the skin at bedtime.      Marland Kitchen levothyroxine (SYNTHROID, LEVOTHROID) 50 MCG tablet Take 50 mcg by mouth daily.      Marland Kitchen losartan (COZAAR) 50 MG tablet Take 50 mg by mouth daily.      . magnesium  oxide (MAG-OX) 400 MG tablet Take 400 mg by mouth 2 (two) times daily.      . metFORMIN (GLUCOPHAGE) 1000 MG tablet Take 1,000 mg by mouth 2 (two) times daily.       . Multiple Vitamins-Minerals (CENTRUM SILVER PO) Take 1 capsule by mouth daily.      Marland Kitchen omega-3 acid ethyl esters (LOVAZA) 1 G capsule Take 2 g by mouth 2 (two) times daily.       Marland Kitchen oxyCODONE-acetaminophen (PERCOCET) 7.5-325 MG per tablet Take 1 tablet by mouth every 4 (four) hours as needed for pain.      . pantoprazole (PROTONIX) 40 MG tablet Take 40 mg by mouth 2 (two) times daily.      Marland Kitchen POTASSIUM GLUCONATE PO Take 1 tablet by mouth daily.      . pravastatin (PRAVACHOL) 20 MG tablet Take 20 mg by mouth at bedtime.      . rivaroxaban (XARELTO) 10 MG TABS tablet Take 10 mg by mouth daily.       No current facility-administered medications for this visit.     Past Medical History  Diagnosis Date  . Chronic a-fib     on xarelto; failed  DCCV  . Biventricular ICD (implantable cardiac defibrillator) in place     BiV ICD for nonischemic cardiomyopathy; BiV responder  . Anemia     no GI bleeding; on iron  . VT (ventricular tachycardia) 9/11    polymorphic VT probably related to sotalol therapy  . Amiodarone pulmonary toxicity   . 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 Renal insufficiency, mild     05/01/12 Creat 1.47  . H/O echocardiogram 07/05/2010    EF 45-50%; aortic valve-mildly sclerotic; LA-mod dilaterd   . Hyperlipemia   . Diabetes   . H/O cardiovascular stress test 08/04/2010    normal imaging; EF 61%  . LBBB (left bundle branch block)   . RAS (renal artery stenosis)     renal dopp (12/18/08)- Right RA-<60%; L RA stent- open; nl size and shape in both kidneys  . PVD (peripheral vascular disease)     L RA stent 1994    ROS:   All systems reviewed and negative except as noted in the HPI.   Past Surgical History  Procedure Laterality Date  . Cholecystectomy      Dr. Sherald Hess in [place] and  Dr. Lyda Jester follows; surgical stricture post procedure with stent placement  . Brain meningioma excision  2011    L frontal meningioma at [facility]   . Biv icd genertaor change out  11/15/10    Medtronic Protecta [id number] serial [id number]  . Av node ablation  06/08/2006    performed by Dr. Beckie Salts; due to Afib with RVR and tachy brady syndrome  . Biv icd placement  06/17/2007; 04/20/14    Medtronic Concerta [id number]; RA lead-Med 5076/53cm, [id number]; RV lead- SJM 7121/65cm, [id number]; LV lead- Med 4194/88cm, [id number]; gen change 04/2014 by Dr Lovena Le  . Cardioversion  07/21/05    converted to sinus brady  . Pv angio  1994    L Renal artery stent      Family History  Problem Relation Age of Onset  . Dementia Sister      History   Social History  . Marital Status: Married    Spouse Name: N/A    Number of Children: N/A  . Years of Education: N/A   Occupational History  . Not on file.   Social History Main Topics  . Smoking status: Never Smoker   . Smokeless tobacco: Not on file  . Alcohol Use: No  . Drug Use: Not on file  . Sexual Activity: Not on file   Other Topics Concern  . Not on file   Social History Narrative  . No narrative on file     BP 152/60  Pulse 76  Ht $R'5\' 8"'Ev$  (1.727 m)  Wt 192 lb 9.6 oz (87.363 kg)  BMI 29.29 kg/m2  Physical Exam:  Well appearing 80 yo woman, NAD HEENT: Unremarkable Neck:  No JVD, no thyromegally Back:  No CVA tenderness Lungs:  Clear with no wheezes, rales, or rhonchi, well-healed ICD incision. HEART:  Regular rate rhythm, no murmurs, no rubs, no clicks Abd:  soft, positive bowel sounds, no organomegally, no rebound, no guarding Ext:  2 plus pulses, no edema, no cyanosis, no clubbing Skin:  No rashes no nodules Neuro:  CN II through XII intact, motor grossly intact  EKG - atrial fib with ventricular pacing  DEVICE  Normal device function.  See PaceArt for details.   Assess/Plan:

## 2014-09-17 ENCOUNTER — Encounter (HOSPITAL_COMMUNITY): Payer: Self-pay | Admitting: Internal Medicine

## 2014-11-01 IMAGING — CR DG CHEST 2V
2 series · 2 of 2 positions shown · non-contrast
Comparison: 01/05/2014.

CLINICAL DATA: Post AICD with attempted left ventricular lead
exchange.

EXAM:
CHEST  2 VIEW

[w chest pa]
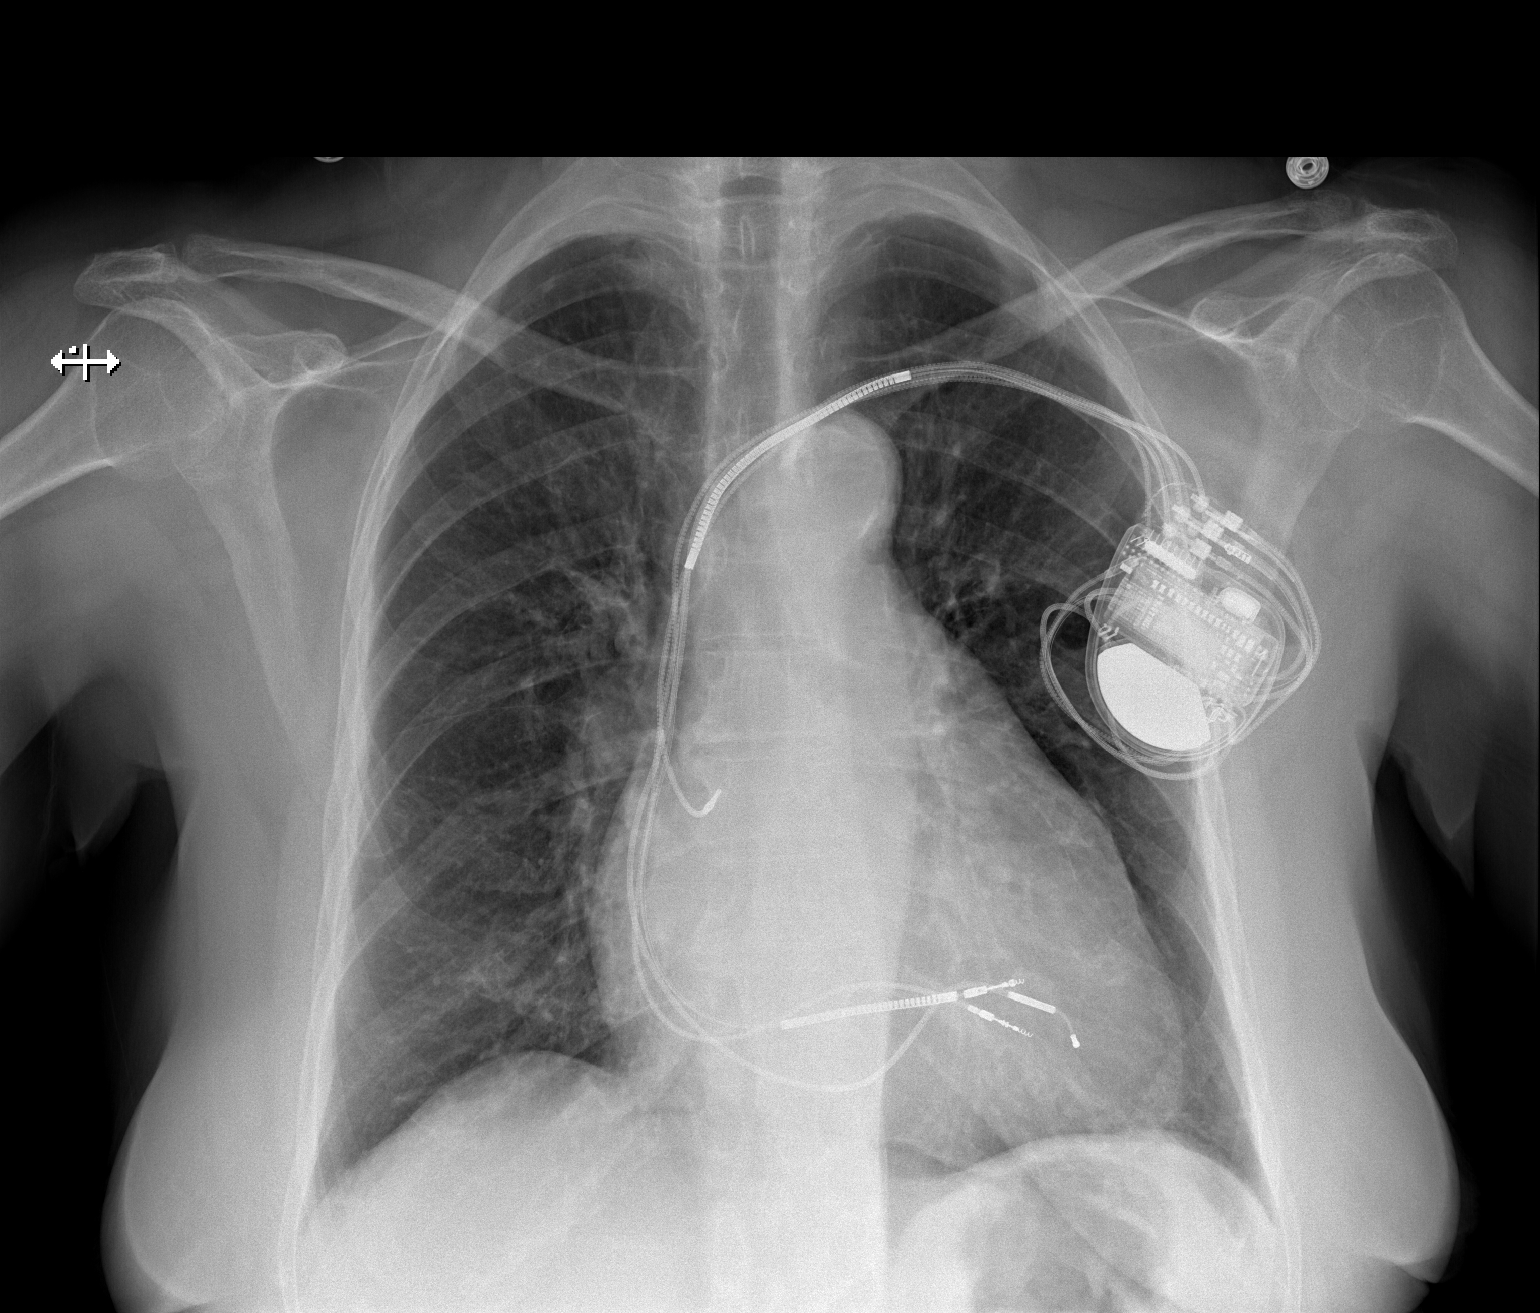

[w chest lat]
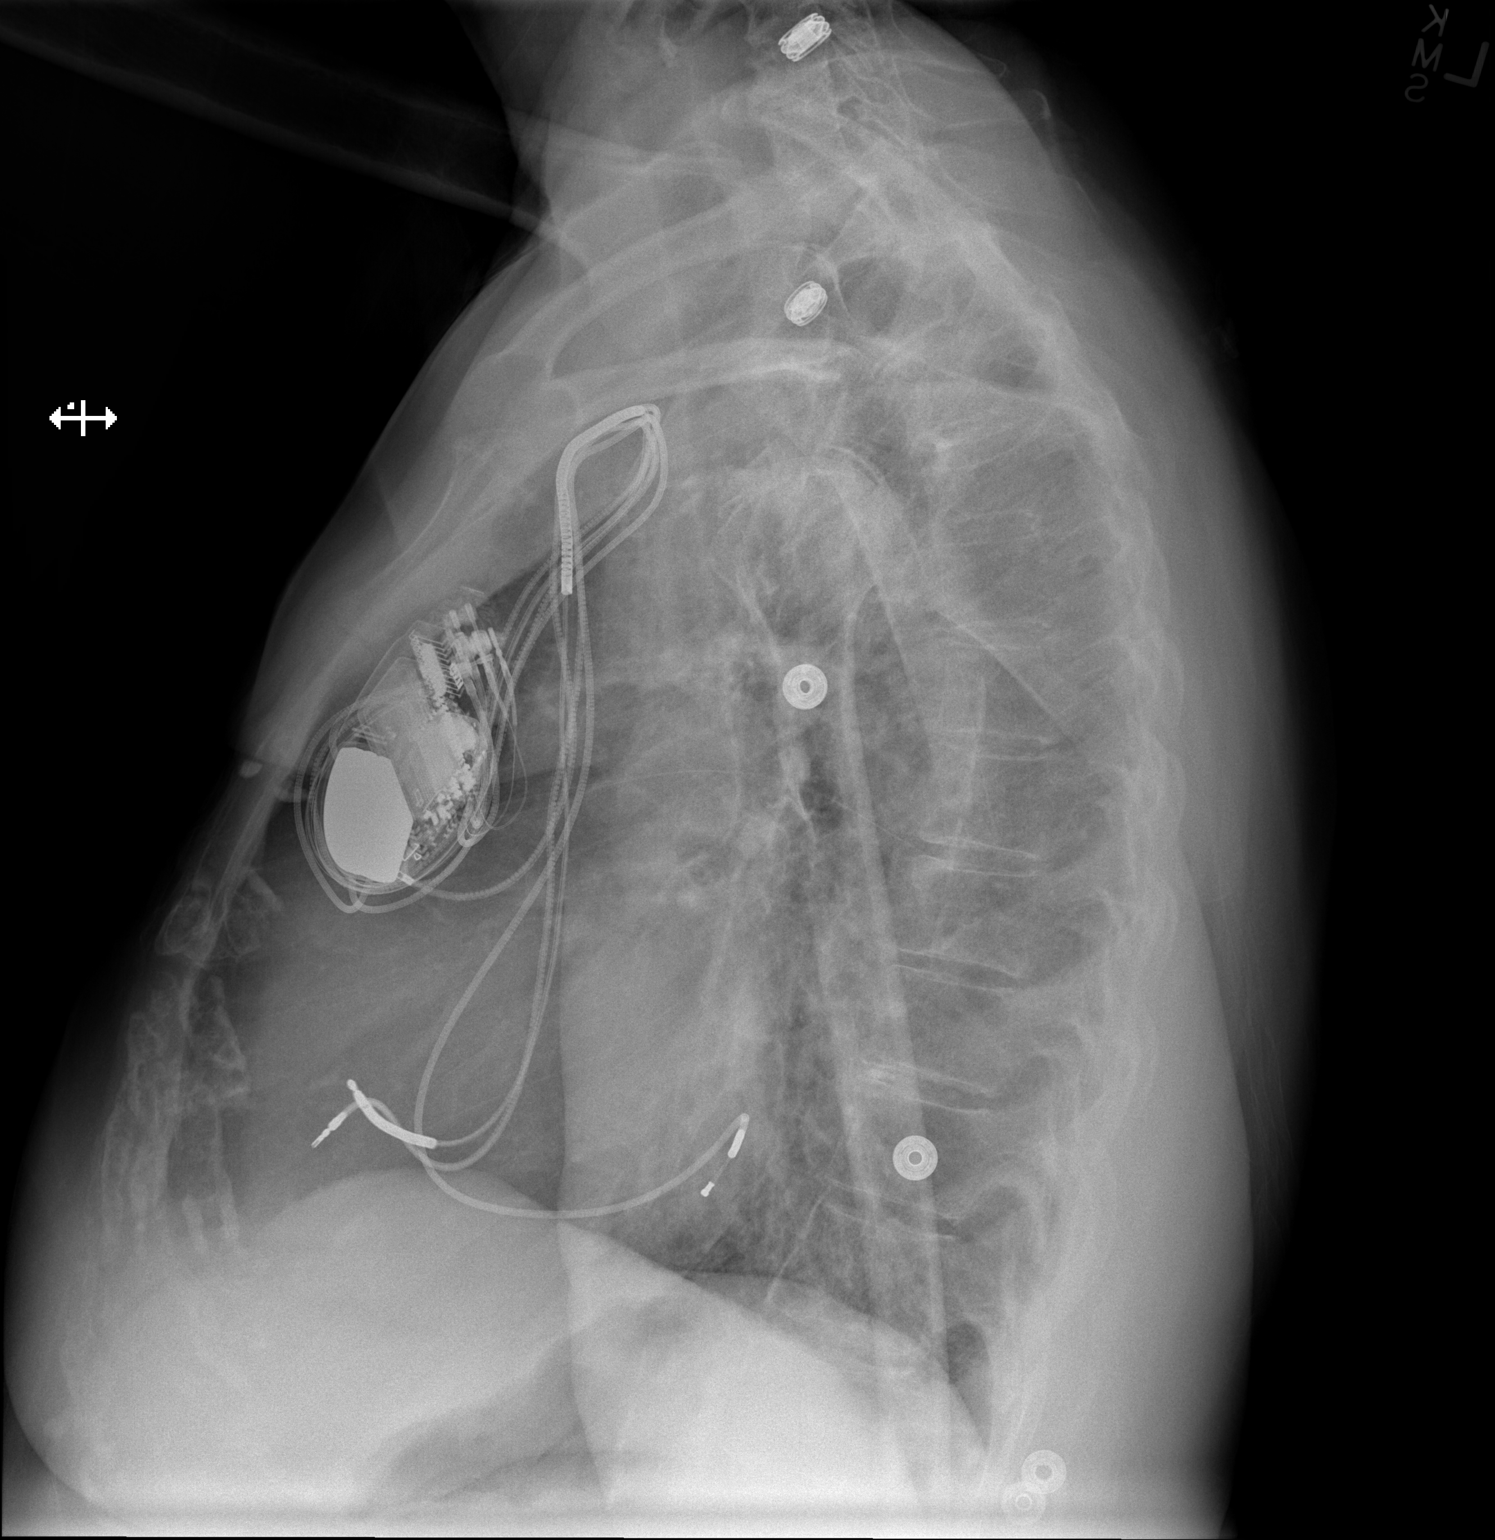

[2 of 2 positions shown; findings below may reference images not displayed]

FINDINGS: Biventricular AICD is in place.

On the lateral view, the left ventricular lead tip appears slightly
posteriorly position compared to the prior exam. This may be related
to projection and slight change of patient position rather than true
change in position of the lead itself.

On the frontal view, leads appear relatively similar to prior exam.

No lead fragment or obvious interruption of leads is noted.
Evaluation of lead interruption is somewhat limited secondary to
overlying leads.

No pneumothorax or evidence of apical hematoma.

Heart appears slightly smaller than on the prior examination
although still enlarged.

No pulmonary edema or infiltrate.

Calcified mildly tortuous aorta.
IMPRESSION: Biventricular AICD is in place.

On the lateral view, the left ventricular lead tip appears slightly
posteriorly position compared to the prior exam. This may be related
to projection and slight change of patient position rather than true
change in position of the lead itself. On the frontal view, leads
appear relatively similar to prior exam.

No lead fragment or obvious interruption of leads is noted.
Evaluation for possibility of lead interruption is somewhat limited
secondary to overlying leads.

No pneumothorax or evidence of apical hematoma.

Heart appears slightly smaller than on the prior examination
although still enlarged.

## 2014-11-02 ENCOUNTER — Ambulatory Visit (INDEPENDENT_AMBULATORY_CARE_PROVIDER_SITE_OTHER): Payer: Medicare HMO | Admitting: *Deleted

## 2014-11-02 DIAGNOSIS — I4729 Other ventricular tachycardia: Secondary | ICD-10-CM

## 2014-11-02 DIAGNOSIS — I5022 Chronic systolic (congestive) heart failure: Secondary | ICD-10-CM

## 2014-11-02 DIAGNOSIS — I472 Ventricular tachycardia: Secondary | ICD-10-CM

## 2014-11-02 LAB — MDC_IDC_ENUM_SESS_TYPE_REMOTE
Battery Remaining Longevity: 62 mo
Battery Voltage: 2.97 V
Brady Statistic AP VP Percent: 0 %
Brady Statistic AP VS Percent: 0 %
Brady Statistic AS VP Percent: 99.57 %
Brady Statistic AS VS Percent: 0.43 %
Brady Statistic RA Percent Paced: 0 %
Brady Statistic RV Percent Paced: 99.65 %
Date Time Interrogation Session: 20160125153613
HighPow Impedance: 43 Ohm
HighPow Impedance: 52 Ohm
Lead Channel Impedance Value: 285 Ohm
Lead Channel Impedance Value: 361 Ohm
Lead Channel Impedance Value: 418 Ohm
Lead Channel Impedance Value: 456 Ohm
Lead Channel Impedance Value: 475 Ohm
Lead Channel Impedance Value: 665 Ohm
Lead Channel Pacing Threshold Amplitude: 0.75 V
Lead Channel Pacing Threshold Amplitude: 2.25 V
Lead Channel Pacing Threshold Pulse Width: 0.4 ms
Lead Channel Pacing Threshold Pulse Width: 1 ms
Lead Channel Sensing Intrinsic Amplitude: 0.25 mV
Lead Channel Sensing Intrinsic Amplitude: 7.25 mV
Lead Channel Setting Pacing Amplitude: 2 V
Lead Channel Setting Pacing Amplitude: 2.75 V
Lead Channel Setting Pacing Pulse Width: 0.4 ms
Lead Channel Setting Pacing Pulse Width: 1 ms
Lead Channel Setting Sensing Sensitivity: 0.3 mV
Zone Setting Detection Interval: 300 ms
Zone Setting Detection Interval: 360 ms
Zone Setting Detection Interval: 400 ms
Zone Setting Detection Interval: 450 ms

## 2014-11-02 NOTE — Progress Notes (Signed)
Remote ICD transmission.   

## 2014-11-12 ENCOUNTER — Encounter: Payer: Self-pay | Admitting: Cardiology

## 2014-11-30 ENCOUNTER — Ambulatory Visit (INDEPENDENT_AMBULATORY_CARE_PROVIDER_SITE_OTHER): Payer: Medicare HMO | Admitting: *Deleted

## 2014-11-30 ENCOUNTER — Encounter: Payer: Self-pay | Admitting: Physician Assistant

## 2014-11-30 ENCOUNTER — Ambulatory Visit (INDEPENDENT_AMBULATORY_CARE_PROVIDER_SITE_OTHER): Payer: Medicare HMO | Admitting: Physician Assistant

## 2014-11-30 ENCOUNTER — Ambulatory Visit: Payer: Medicare HMO | Admitting: Physician Assistant

## 2014-11-30 ENCOUNTER — Ambulatory Visit
Admission: RE | Admit: 2014-11-30 | Discharge: 2014-11-30 | Disposition: A | Discharge status: Home / Self Care | Payer: Medicare HMO | Source: Ambulatory Visit | Attending: Physician Assistant | Admitting: Physician Assistant

## 2014-11-30 VITALS — BP 160/78 | HR 75 | Ht 68.0 in | Wt 196.0 lb

## 2014-11-30 DIAGNOSIS — I482 Chronic atrial fibrillation, unspecified: Secondary | ICD-10-CM

## 2014-11-30 DIAGNOSIS — I442 Atrioventricular block, complete: Secondary | ICD-10-CM | POA: Diagnosis not present

## 2014-11-30 DIAGNOSIS — I428 Other cardiomyopathies: Secondary | ICD-10-CM

## 2014-11-30 DIAGNOSIS — I429 Cardiomyopathy, unspecified: Secondary | ICD-10-CM

## 2014-11-30 DIAGNOSIS — I4729 Other ventricular tachycardia: Secondary | ICD-10-CM

## 2014-11-30 DIAGNOSIS — R5383 Other fatigue: Secondary | ICD-10-CM

## 2014-11-30 DIAGNOSIS — Z95810 Presence of automatic (implantable) cardiac defibrillator: Secondary | ICD-10-CM

## 2014-11-30 DIAGNOSIS — I472 Ventricular tachycardia: Secondary | ICD-10-CM

## 2014-11-30 DIAGNOSIS — I5022 Chronic systolic (congestive) heart failure: Secondary | ICD-10-CM

## 2014-11-30 DIAGNOSIS — R0602 Shortness of breath: Secondary | ICD-10-CM

## 2014-11-30 DIAGNOSIS — I701 Atherosclerosis of renal artery: Secondary | ICD-10-CM

## 2014-11-30 LAB — CBC WITH DIFFERENTIAL/PLATELET
Basophils Absolute: 0.1 10*3/uL (ref 0.0–0.1)
Basophils Relative: 0.8 % (ref 0.0–3.0)
Eosinophils Absolute: 0.1 10*3/uL (ref 0.0–0.7)
Eosinophils Relative: 0.7 % (ref 0.0–5.0)
HCT: 27.6 % — ABNORMAL LOW (ref 36.0–46.0)
Hemoglobin: 8.5 g/dL — ABNORMAL LOW (ref 12.0–15.0)
Lymphocytes Relative: 16.6 % (ref 12.0–46.0)
Lymphs Abs: 1.4 10*3/uL (ref 0.7–4.0)
MCHC: 30.9 g/dL (ref 30.0–36.0)
MCV: 68.4 fl — ABNORMAL LOW (ref 78.0–100.0)
Monocytes Absolute: 0.6 10*3/uL (ref 0.1–1.0)
Monocytes Relative: 7.4 % (ref 3.0–12.0)
Neutro Abs: 6.2 10*3/uL (ref 1.4–7.7)
Neutrophils Relative %: 74.5 % (ref 43.0–77.0)
Platelets: 320 10*3/uL (ref 150.0–400.0)
RBC: 4.04 Mil/uL (ref 3.87–5.11)
RDW: 18 % — ABNORMAL HIGH (ref 11.5–15.5)
WBC: 8.3 10*3/uL (ref 4.0–10.5)

## 2014-11-30 LAB — MDC_IDC_ENUM_SESS_TYPE_INCLINIC
Battery Remaining Longevity: 52 mo
Battery Voltage: 2.96 V
Brady Statistic AP VP Percent: 0 %
Brady Statistic AP VS Percent: 0 %
Brady Statistic AS VP Percent: 98.85 %
Brady Statistic AS VS Percent: 1.15 %
Brady Statistic RA Percent Paced: 0 %
Brady Statistic RV Percent Paced: 99.03 %
Date Time Interrogation Session: 20160222141931
HighPow Impedance: 247 Ohm
HighPow Impedance: 41 Ohm
HighPow Impedance: 54 Ohm
Lead Channel Impedance Value: 361 Ohm
Lead Channel Impedance Value: 456 Ohm
Lead Channel Impedance Value: 456 Ohm
Lead Channel Impedance Value: 456 Ohm
Lead Channel Impedance Value: 665 Ohm
Lead Channel Pacing Threshold Amplitude: 0.875 V
Lead Channel Pacing Threshold Amplitude: 2.75 V
Lead Channel Pacing Threshold Pulse Width: 0.4 ms
Lead Channel Pacing Threshold Pulse Width: 1 ms
Lead Channel Sensing Intrinsic Amplitude: 0.25 mV
Lead Channel Sensing Intrinsic Amplitude: 0.25 mV
Lead Channel Sensing Intrinsic Amplitude: 6.375 mV
Lead Channel Sensing Intrinsic Amplitude: 6.875 mV
Lead Channel Setting Pacing Amplitude: 2.5 V
Lead Channel Setting Pacing Amplitude: 3 V
Lead Channel Setting Pacing Pulse Width: 0.4 ms
Lead Channel Setting Pacing Pulse Width: 1 ms
Lead Channel Setting Sensing Sensitivity: 0.3 mV
Zone Setting Detection Interval: 300 ms
Zone Setting Detection Interval: 360 ms
Zone Setting Detection Interval: 400 ms
Zone Setting Detection Interval: 450 ms

## 2014-11-30 LAB — BASIC METABOLIC PANEL
BUN: 24 mg/dL — ABNORMAL HIGH (ref 6–23)
CO2: 26 mEq/L (ref 19–32)
Calcium: 9.9 mg/dL (ref 8.4–10.5)
Chloride: 103 mEq/L (ref 96–112)
Creatinine, Ser: 1.41 mg/dL — ABNORMAL HIGH (ref 0.40–1.20)
GFR: 38.32 mL/min — ABNORMAL LOW (ref >60.00)
Glucose, Bld: 79 mg/dL (ref 70–99)
Potassium: 4.7 mEq/L (ref 3.5–5.1)
Sodium: 140 mEq/L (ref 135–145)

## 2014-11-30 LAB — BRAIN NATRIURETIC PEPTIDE: Pro B Natriuretic peptide (BNP): 254 pg/mL — ABNORMAL HIGH (ref 0.0–100.0)

## 2014-11-30 LAB — TSH: TSH: 1.65 u[IU]/mL (ref 0.35–4.50)

## 2014-11-30 MED ORDER — METOPROLOL SUCCINATE ER 25 MG PO TB24
25.0000 mg | ORAL_TABLET | Freq: Every day | ORAL | Status: DC
Start: 2014-11-30 — End: 2016-08-07

## 2014-11-30 MED ORDER — RIVAROXABAN 15 MG PO TABS: 15.0000 mg | ORAL_TABLET | Freq: Every day | ORAL | Status: DC

## 2014-11-30 NOTE — Progress Notes (Signed)
Cardiology Office Note   Date:  11/30/2014   ID:  DAHJA SPADEA, DOB [birth year], MRN [id number]  PCP:  [name], MD  Cardiologist/Electrophysiologist:  Dr. Cristopher Peru    Chief Complaint  Patient presents with  . Shortness of Breath     History of Present Illness: [name] is a 81 y.o. female with a hx of NICM, systolic HF, chronic AFib, prior AVN ablation s/p CRT-P, RA stenosis s/p prior stent to L RA, L frontal meningioma s/p resection and craniotomy.  She had VT and was changed to a CRT-D.  Previous EF 30%.  This improved to normal after insertion of BiV-ICD.  She has a hx of suspected Amio toxicity, but this was never well documented.  Gen change 2/2 device at ERI in 7/15 c/b inability to place new LV lead.  She did not require ICD implantation given normal LVF, but ICD was required to continue to pace biventricularly without an unacceptably elevated LV pacing threshold.  Last seen by Dr. Cristopher Peru 07/2014.  The patient's son has been in and out of the hospital for 6 mos.  She has been under a great deal of stress. She has been traveling back and forth to [facility] and another outside hospital as well as rehab.  Over the last 2 weeks, she has noted increased DOE.  She is NYHA 3.  She denies orthopnea, PND, edema.  She may note increase abdominal girth.  She denies cough or wheezing.  She denies chest pain, syncope or ICD discharge.  She did take an extra Lasix yesterday and feels a little better today.  She has noted a "jumping" sensation in her L side like she did before with diaphragmatic stimulation.     Studies/Reports Reviewed Today:  Echocardiogram 04/02/14 - Left ventricle: EF 60% to 65%. - Mitral valve: There was mild to moderate regurgitation. - Left atrium: The atrium was moderately dilated. - Right ventricle: The cavity size was mildly dilated. - Right atrium: The atrium was moderately dilated.  Renal Artery [name] 11/2012 Proxima R RA 1-59% L RA stent  ok  Myoview 07/2010 No ischemia, EF 61%   Past Medical History  Diagnosis Date  . Chronic a-fib     on xarelto; failed DCCV  . Biventricular ICD (implantable cardiac defibrillator) in place     BiV ICD for nonischemic cardiomyopathy; BiV responder  . Anemia     no GI bleeding; on iron  . VT (ventricular tachycardia) 9/11    polymorphic VT probably related to sotalol therapy  . Amiodarone pulmonary toxicity   . 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 Renal insufficiency, mild     05/01/12 Creat 1.47  . H/O echocardiogram 07/05/2010    EF 45-50%; aortic valve-mildly sclerotic; LA-mod dilaterd   . Hyperlipemia   . Diabetes   . H/O cardiovascular stress test 08/04/2010    normal imaging; EF 61%  . LBBB (left bundle branch block)   . RAS (renal artery stenosis)     renal dopp (12/18/08)- Right RA-<60%; L RA stent- open; nl size and shape in both kidneys  . PVD (peripheral vascular disease)     L RA stent 1994    Past Surgical History  Procedure Laterality Date  . Cholecystectomy      Dr. Sherald Hess in [place] and Dr. Lyda Jester follows; surgical stricture post procedure with stent placement  . Brain meningioma excision  2011    L frontal meningioma at [facility]   .  Biv icd genertaor change out  11/15/10    Medtronic Protecta [id number] serial [id number]  . Av node ablation  06/08/2006    performed by Dr. Beckie Salts; due to Afib with RVR and tachy brady syndrome  . Biv icd placement  06/17/2007; 04/20/14    Medtronic Concerta 0000000; RA lead-Med 5076/53cm, [id number]; RV lead- SJM 7121/65cm, [id number]; LV lead- Med 4194/88cm, [id number]; gen change 04/2014 by Dr Lovena Le  . Cardioversion  07/21/05    converted to sinus brady  . Pv angio  1994    L Renal artery stent   . Biv pacemaker generator change out N/A 04/20/2014    Procedure: BIV PACEMAKER GENERATOR CHANGE OUT;  Surgeon: Evans Lance, MD;  Location: [facility] CATH LAB;  Service: Cardiovascular;  Laterality: N/A;      Current Outpatient Prescriptions  Medication Sig Dispense Refill  . aspirin 81 MG tablet Take 81 mg by mouth daily.     . digoxin (LANOXIN) 0.125 MG tablet Take 0.125 mg by mouth daily.     Marland Kitchen ezetimibe (ZETIA) 10 MG tablet Take 10 mg by mouth daily.    . fluticasone (FLONASE) 50 MCG/ACT nasal spray Place 2 sprays into both nostrils as needed for allergies or rhinitis.     . furosemide (LASIX) 40 MG tablet Take 40 mg by mouth daily.    Marland Kitchen glipiZIDE (GLUCOTROL) 5 MG tablet Take 5 mg by mouth 2 (two) times daily before a meal.    . Homeopathic Products ([employer]S LEG CRAMPS PAIN RELIEF) TABS Take 1 tablet by mouth as needed (for leg cramps).    . Insulin Detemir (LEVEMIR FLEXPEN) 100 UNIT/ML Pen Inject 30 Units into the skin at bedtime.    Marland Kitchen levothyroxine (SYNTHROID, LEVOTHROID) 50 MCG tablet Take 50 mcg by mouth daily.    Marland Kitchen losartan (COZAAR) 50 MG tablet Take 50 mg by mouth daily.    . magnesium oxide (MAG-OX) 400 MG tablet Take 400 mg by mouth 2 (two) times daily.    . metFORMIN (GLUCOPHAGE) 1000 MG tablet Take 1,000 mg by mouth 2 (two) times daily.     . Multiple Vitamins-Minerals (CENTRUM SILVER PO) Take 1 capsule by mouth daily.    Marland Kitchen omega-3 acid ethyl esters (LOVAZA) 1 G capsule Take 2 g by mouth 2 (two) times daily.     Marland Kitchen oxyCODONE-acetaminophen (PERCOCET) 7.5-325 MG per tablet Take 1 tablet by mouth every 4 (four) hours as needed for pain.    . pantoprazole (PROTONIX) 40 MG tablet Take 40 mg by mouth 2 (two) times daily.    Marland Kitchen POTASSIUM GLUCONATE PO Take 1 tablet by mouth daily.    . pravastatin (PRAVACHOL) 20 MG tablet Take 20 mg by mouth at bedtime.    . rivaroxaban (XARELTO) 10 MG TABS tablet Take 10 mg by mouth daily.     No current facility-administered medications for this visit.    Allergies:   Lyrica; Neurontin; Morphine; Lisinopril; Penicillins; and Tape    Social History:  The patient  reports that she has never smoked. She does not have any smokeless tobacco history on  file. She reports that she does not drink alcohol.   Family History:  The patient's family history includes Dementia in her sister; Heart attack in her brother; Heart failure in her mother; Stroke in her maternal grandmother.    ROS:   Please see the history of present illness.   Review of Systems  Constitution: Positive for malaise/fatigue.  Hematologic/Lymphatic: Bruises/bleeds easily.  Musculoskeletal: Positive  for back pain.  All other systems reviewed and are negative.     PHYSICAL EXAM: VS:  BP 160/78 mmHg  Pulse 75  Ht 5\' 8"  (1.727 m)  Wt 196 lb (88.905 kg)  BMI 29.81 kg/m2    Wt Readings from Last 3 Encounters:  11/30/14 196 lb (88.905 kg)  07/29/14 192 lb 9.6 oz (87.363 kg)  04/20/14 190 lb (86.183 kg)     GEN: Well nourished, well developed, in no acute distress HEENT: normal Neck: no JVD, no masses Cardiac:  Normal S1/S2, RRR; no murmur, no rubs or gallops, 1+ ankle edema  Respiratory:  clear to auscultation bilaterally, no wheezing, rhonchi or rales. GI: soft, nontender, nondistended, + BS MS: no deformity or atrophy Skin: warm and dry  Neuro:  CNs II-XII intact, Strength and sensation are intact Psych: Normal affect   EKG:  EKG is ordered today.  It demonstrates:   V paced, HR 75   Recent Labs: 04/13/2014: BUN 24*; Creatinine 1.5*; Hemoglobin 10.6*; Platelets 328.0; Potassium 4.5; Sodium 139    Lipid Panel No results found for: CHOL, TRIG, HDL, CHOLHDL, VLDL, LDLCALC, LDLDIRECT   Device Interrogation: No ventricular arrhythmias. Several high ventricular HR episodes related to AFib OptiVol with normal impedence   ASSESSMENT AND PLAN:  1.  Dyspnea:  Etiology not entirely clear.  She also notes fatigue.  She did take an extra Lasix yesterday. She feels that her breathing is somewhat better today. Her thoracic impedance measurements are normal. However, her weight is slightly increased. She does feel some increased abdominal girth.  She does have  evidence of vascular disease with prior L RA stenting in the past.  She is a diabetic and is certainly at risk for CAD.  She apparently had a normal cath many years ago that was normal.  I cannot locate this report.    -  Labs today:  BMET, CBC, TSH, BNP, Dig Level.    -  Increase Lasix to 40 mg Twice daily and K+ to 20 mEq Twice daily for 2 more days to cover for the possibility of volume excess.    -  Obtain CXR.    -  Arrange Lexiscan Myoview to rule out ischemia and reassess EF. 2.  Chronic Atrial Fibrillation:  Interrogation of her device demonstrates some high ventricular HRs at times.  I am not certain as to why she is not on a beta blocker.  She thinks she had SEs to Coreg in the past.  BP could certainly tolerate a beta blocker.  I am not certain if her dyspnea is related to this elevated HR at times.  Also, she is on Xarelto 10 mg QD.  Given her Creat Clearance of 43, she should be on 15 mg QD. I am not certain as to why she is on 10 mg and have thoroughly reviewed her chart. There is no indication for this dose.    -  Change Xarelto to 15 mg QD.    -  Add Toprol-XL 25 mg QD.    -  Check Dig level. 3.  Non-Ischemic CM:  EF has recovered in the past.  Continue ARB.  Add beta blocker as noted.  Reassess EF by nuclear study as noted.  4.  Chronic Systolic CHF:  As noted, her OptiVol measurement is normal.  However, I cannot completely rule the possibility of volume excess contributing to her symptoms.  Increase Lasix for 2 days.  Check BNP, CXR as noted. 5.  S/p  BiV-ICD:  She was having problems with diaphragmatic stimulation.  Her device was interrogated today.  LV vector settings were changed to eliminate diaphragmatic stimulation.   6.  Ventricular Tachycardia:  No recurrence on interrogation of device.  7.  Renal Artery Stenosis s/p prior L RA stent:  No FU duplex in almost 2 years.      -  Arrange FU renal artery duplex.    Current medicines are reviewed at length with the patient today.   The patient does not have concerns regarding medicines.  The following changes have been made:  As above.   Labs/ tests ordered today include:   Orders Placed This Encounter  Procedures  . DG Chest 2 View  . Basic Metabolic Panel (BMET)  . CBC w/Diff  . TSH  . B Nat Peptide  . Digoxin level  . Myocardial Perfusion Imaging  . EKG 12-Lead  . Renal Artery Duplex Bilateral    Disposition:   FU with Dr. Cristopher Peru  in 2 weeks   Signed, Versie Starks, MHS 11/30/2014 10:56 AM    [address], [place], [place]  [address] Phone: [phone]; Fax: [phone]

## 2014-11-30 NOTE — Patient Instructions (Addendum)
Your physician has requested that you have a lexiscan myoview. For further information please visit [email or web address]. Please follow instruction sheet, as given.  Your physician has requested that you have a renal artery duplex. During this test, an ultrasound is used to evaluate blood flow to the kidneys. Allow one hour for this exam. Do not eat after midnight the day before and avoid carbonated beverages. Take your medications as you usually do.  Your physician has recommended you make the following change in your medication:  1. STOP XARELTO 10 MG 2. START XARELTO 15 MG DAILY; NEW RX SENT IN TODAY 3. START TOPROL XL 25 MG DAILY; NEW RX SENT IN TODAY  LAB WORK TODAY; BMET, CBC W/DIFF, TSH, BNP, DIGOXIN LEVEL  CHEST X-RAY TODAY AT [facility] ON [address]  TAKE INCREASED DOSE OF LASIX AND POTASSIUM TWICE DAILY FOR 2 DAYS ONLY THEN RESUME CURRENT DOSE  FOLLOW UP WITH DR. Lovena Le IN 2 WEEKS

## 2014-11-30 NOTE — Progress Notes (Signed)
CRT-D device check in office w/Scott Weaver,PA for shortness of breath and stim. Thresholds and sensing consistent with previous device measurements. Lead impedance trends stable over time. Permanent AF + Xarelto. No ventricular arrhythmia episodes recorded. 207 VS episodes---max dur. 53 sec, Max V 154. Patient bi-ventricularly pacing 100% of the time with 1 % as VSRp. Device programmed with appropriate safety margins. Heart failure diagnostics reviewed and trends are mostly stable for patient. Pt had new c/o consistent w/diaphargmatic stim so LV config was reprogrammed from LVtip to RVc back to previous setting of LVring to RVc  (+0.5V) safety margin---no impact on batt longevity, which is currently 4.6 years.  Patient enrolled in remote follow up. Plan to follow up via [facility] on 5/23 and with GT in 04-2015.

## 2014-12-01 ENCOUNTER — Telehealth: Payer: Self-pay | Admitting: *Deleted

## 2014-12-01 LAB — DIGOXIN LEVEL: Digoxin Level: 0.6 ng/mL — ABNORMAL LOW (ref 0.8–2.0)

## 2014-12-01 NOTE — Telephone Encounter (Signed)
pt notified about cxr and lab results with verbal understanding. Pt haas f/u tomorrow w/PCP 2/24. I will fax lab results to pcp today due to anemia worse. Per Brynda Rim. PA cancel myoview, however keep renal US 3/8. Pt said ok and thank you.

## 2014-12-09 ENCOUNTER — Encounter: Payer: Self-pay | Admitting: Physician Assistant

## 2014-12-15 ENCOUNTER — Encounter (HOSPITAL_COMMUNITY): Payer: Medicare HMO

## 2014-12-15 ENCOUNTER — Encounter: Payer: Self-pay | Admitting: Internal Medicine

## 2014-12-15 ENCOUNTER — Encounter: Payer: Self-pay | Admitting: Physician Assistant

## 2014-12-15 ENCOUNTER — Ambulatory Visit (HOSPITAL_COMMUNITY): Payer: Medicare HMO | Attending: Cardiology | Admitting: Cardiology

## 2014-12-15 DIAGNOSIS — I701 Atherosclerosis of renal artery: Secondary | ICD-10-CM | POA: Insufficient documentation

## 2014-12-15 NOTE — Progress Notes (Signed)
Renal artery duplex performed  

## 2014-12-16 ENCOUNTER — Telehealth: Payer: Self-pay | Admitting: *Deleted

## 2014-12-16 DIAGNOSIS — I701 Atherosclerosis of renal artery: Secondary | ICD-10-CM

## 2014-12-16 NOTE — Telephone Encounter (Signed)
pt notified of renal duplex results with verbal understanding to results given today, repeat renal duplex in 1 yr. pt agreeable to plan of care.

## 2014-12-17 ENCOUNTER — Encounter: Payer: Self-pay | Admitting: Internal Medicine

## 2014-12-22 ENCOUNTER — Encounter: Payer: Self-pay | Admitting: Internal Medicine

## 2014-12-22 ENCOUNTER — Ambulatory Visit (INDEPENDENT_AMBULATORY_CARE_PROVIDER_SITE_OTHER): Payer: Medicare HMO | Admitting: Internal Medicine

## 2014-12-22 VITALS — BP 162/78 | HR 78 | Ht 68.0 in | Wt 194.0 lb

## 2014-12-22 DIAGNOSIS — I5022 Chronic systolic (congestive) heart failure: Secondary | ICD-10-CM

## 2014-12-22 DIAGNOSIS — I482 Chronic atrial fibrillation, unspecified: Secondary | ICD-10-CM

## 2014-12-22 DIAGNOSIS — I4901 Ventricular fibrillation: Secondary | ICD-10-CM

## 2014-12-22 DIAGNOSIS — I442 Atrioventricular block, complete: Secondary | ICD-10-CM | POA: Diagnosis not present

## 2014-12-22 DIAGNOSIS — Z95810 Presence of automatic (implantable) cardiac defibrillator: Secondary | ICD-10-CM

## 2014-12-22 LAB — MDC_IDC_ENUM_SESS_TYPE_INCLINIC
Battery Remaining Longevity: 48 mo
Battery Voltage: 2.95 V
Brady Statistic AP VP Percent: 0 %
Brady Statistic AP VS Percent: 0 %
Brady Statistic AS VP Percent: 97.98 %
Brady Statistic AS VS Percent: 2.02 %
Brady Statistic RA Percent Paced: 0 %
Brady Statistic RV Percent Paced: 98.3 %
Date Time Interrogation Session: 20160315121410
HighPow Impedance: 285 Ohm
HighPow Impedance: 47 Ohm
HighPow Impedance: 64 Ohm
Lead Channel Impedance Value: 361 Ohm
Lead Channel Impedance Value: 456 Ohm
Lead Channel Impedance Value: 475 Ohm
Lead Channel Impedance Value: 551 Ohm
Lead Channel Impedance Value: 646 Ohm
Lead Channel Pacing Threshold Amplitude: 1 V
Lead Channel Pacing Threshold Amplitude: 3 V
Lead Channel Pacing Threshold Pulse Width: 0.4 ms
Lead Channel Pacing Threshold Pulse Width: 1 ms
Lead Channel Sensing Intrinsic Amplitude: 1 mV
Lead Channel Sensing Intrinsic Amplitude: 8.75 mV
Lead Channel Setting Pacing Amplitude: 2.5 V
Lead Channel Setting Pacing Amplitude: 3.5 V
Lead Channel Setting Pacing Pulse Width: 0.4 ms
Lead Channel Setting Pacing Pulse Width: 1 ms
Lead Channel Setting Sensing Sensitivity: 0.3 mV
Zone Setting Detection Interval: 300 ms
Zone Setting Detection Interval: 360 ms
Zone Setting Detection Interval: 400 ms
Zone Setting Detection Interval: 450 ms

## 2014-12-22 NOTE — Assessment & Plan Note (Signed)
interogation of her biventricular ICD demonstrates that her medtronic device is working normally.  Will follow.

## 2014-12-22 NOTE — Patient Instructions (Signed)
Your physician wants you to follow-up in: 12 months with Dr Knox Saliva will receive a reminder letter in the mail two months in advance. If you don't receive a letter, please call our office to schedule the follow-up appointment.   Remote monitoring is used to monitor your Pacemaker or ICD from home. This monitoring reduces the number of office visits required to check your device to one time per year. It allows [name] to keep an eye on the functioning of your device to ensure it is working properly. You are scheduled for a device check from home on 03/23/15. You may send your transmission at any time that day. If you have a wireless device, the transmission will be sent automatically. After your physician reviews your transmission, you will receive a postcard with your next transmission date.

## 2014-12-22 NOTE — Progress Notes (Signed)
HPI [name] returns today for ongoing evaluation and management of her ICD. She is a very pleasant 81 yo woman who I initially met approx. 8 years ago when she was referred for BiV PM insertion and AV node ablation which were successfully performed. She has now had chronic atrial fibrillation. Her heart failure symptoms are class 2. She has not had any ICD shocks. She notes mild peripheral edema. Her biggest complaint is related to back pain. She has not had syncope. She had reached ERI on her ICD and underwent generator change. We'll plan to downgrade the patient to a biventricular pacemaker, however left ventricular pacing can only be carried out and accomplished when utilizing the right ventricular shocking coil as part of the pacing circuit. In addition, the patient had a history of polymorphic ventricular tachycardia. Since her device was placed she has been stable. She is been quite concerned about her son who was hospitalized with swelling on the brain in conjunction with prior brain cancer. He has finally returned home. Her biggest complaint today is fatigue related to caring full time for her son. Allergies  Allergen Reactions  . Lyrica [Pregabalin]     Swelling   . Neurontin [Gabapentin]     Swelling   . Morphine Nausea Only  . Lisinopril     Rash   . Penicillins     Rash   . Tape Rash    TAPE     Current Outpatient Prescriptions  Medication Sig Dispense Refill  . aspirin 81 MG tablet Take 81 mg by mouth daily.     . digoxin (LANOXIN) 0.125 MG tablet Take 0.125 mg by mouth daily.     Marland Kitchen ezetimibe (ZETIA) 10 MG tablet Take 10 mg by mouth daily.    . fluticasone (FLONASE) 50 MCG/ACT nasal spray Place 2 sprays into both nostrils daily as needed for allergies or rhinitis.     . furosemide (LASIX) 40 MG tablet Take 40 mg by mouth daily.    Marland Kitchen glipiZIDE (GLUCOTROL) 5 MG tablet Take 5 mg by mouth 2 (two) times daily before a meal.    . Homeopathic Products (CVS LEG  CRAMPS PAIN RELIEF) TABS Take 1 tablet by mouth daily as needed (for leg cramps).     . Insulin Detemir (LEVEMIR FLEXPEN) 100 UNIT/ML Pen Inject 30 Units into the skin at bedtime.    Marland Kitchen levothyroxine (SYNTHROID, LEVOTHROID) 50 MCG tablet Take 50 mcg by mouth daily.    Marland Kitchen losartan (COZAAR) 50 MG tablet Take 50 mg by mouth daily.    . magnesium oxide (MAG-OX) 400 MG tablet Take 400 mg by mouth 2 (two) times daily.    . metFORMIN (GLUCOPHAGE) 1000 MG tablet Take 1,000 mg by mouth 2 (two) times daily.     . metoprolol succinate (TOPROL-XL) 25 MG 24 hr tablet Take 1 tablet (25 mg total) by mouth daily. 30 tablet 11  . Multiple Vitamins-Minerals (CENTRUM SILVER PO) Take 1 capsule by mouth daily.    Marland Kitchen omega-3 acid ethyl esters (LOVAZA) 1 G capsule Take 2 g by mouth 2 (two) times daily.     Marland Kitchen oxyCODONE-acetaminophen (PERCOCET) 7.5-325 MG per tablet Take 1 tablet by mouth every 4 (four) hours as needed for pain.    . pantoprazole (PROTONIX) 40 MG tablet Take 40 mg by mouth 2 (two) times daily.    Marland Kitchen POTASSIUM GLUCONATE PO Take 1 tablet by mouth daily.    . pravastatin (PRAVACHOL) 20 MG tablet  Take 20 mg by mouth at bedtime.    . Rivaroxaban (XARELTO) 15 MG TABS tablet Take 1 tablet (15 mg total) by mouth daily with supper. 30 tablet 11   No current facility-administered medications for this visit.     Past Medical History  Diagnosis Date  . Chronic a-fib     on xarelto; failed DCCV  . Biventricular ICD (implantable cardiac defibrillator) in place     BiV ICD for nonischemic cardiomyopathy; BiV responder  . Anemia     no GI bleeding; on iron  . VT (ventricular tachycardia) 9/11    polymorphic VT probably related to sotalol therapy  . Amiodarone pulmonary toxicity   . 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 Renal insufficiency, mild     05/01/12 Creat 1.47  . H/O echocardiogram 07/05/2010    EF 45-50%; aortic valve-mildly sclerotic; LA-mod dilaterd   . Hyperlipemia   . Diabetes   .  H/O cardiovascular stress test 08/04/2010    normal imaging; EF 61%  . LBBB (left bundle branch block)   . RAS (renal artery stenosis)     a. renal dopp (12/18/08)- Right RA-<60%; L RA stent- open; nl size and shape in both kidneys;  b.  Rena Artery [name] (3/16):  SMA with > 70% stenosis, Bilateral prox RA with 1-59%  . PVD (peripheral vascular disease)     L RA stent 1994    ROS:   All systems reviewed and negative except as noted in the HPI.   Past Surgical History  Procedure Laterality Date  . Cholecystectomy      Dr. Sherald Hess in [place] and Dr. Lyda Jester follows; surgical stricture post procedure with stent placement  . Brain meningioma excision  2011    L frontal meningioma at [facility]   . Biv icd genertaor change out  11/15/10    Medtronic Protecta [id number] serial [id number] H  . Av node ablation  06/08/2006    performed by Dr. Beckie Salts; due to Afib with RVR and tachy brady syndrome  . Biv icd placement  06/17/2007; 04/20/14    Medtronic Concerta [id number]; RA lead-Med 5076/53cm, [id number]; RV lead- SJM 7121/65cm, [id number]; LV lead- Med 4194/88cm, [id number]; gen change 04/2014 by Dr Lovena Le  . Cardioversion  07/21/05    converted to sinus brady  . Pv angio  1994    L Renal artery stent   . Biv pacemaker generator change out N/A 04/20/2014    Procedure: BIV PACEMAKER GENERATOR CHANGE OUT;  Surgeon: Evans Lance, MD;  Location: [facility] CATH LAB;  Service: Cardiovascular;  Laterality: N/A;     Family History  Problem Relation Age of Onset  . Dementia Sister   . Heart attack Brother   . Stroke Maternal Grandmother   . Heart failure Mother      History   Social History  . Marital Status: Married    Spouse Name: N/A  . Number of Children: N/A  . Years of Education: N/A   Occupational History  . Not on file.   Social History Main Topics  . Smoking status: Never Smoker   . Smokeless tobacco: Not on file  . Alcohol Use: No  . Drug Use: Not on file  . Sexual Activity:  Not on file   Other Topics Concern  . Not on file   Social History Narrative     BP 162/78 mmHg  Pulse 78  Ht _0  (1.727 m)  Wt 194 lb (87.998 kg)  BMI 29.50 kg/m2  Physical Exam:  Well appearing 81 yo woman, NAD HEENT: Unremarkable Neck:  No JVD, no thyromegally Back:  No CVA tenderness Lungs:  Clear with no wheezes, rales, or rhonchi, well-healed ICD incision. HEART:  Regular rate rhythm, no murmurs, no rubs, no clicks Abd:  soft, positive bowel sounds, no organomegally, no rebound, no guarding Ext:  2 plus pulses, no edema, no cyanosis, no clubbing Skin:  No rashes no nodules Neuro:  CN II through XII intact, motor grossly intact  EKG - atrial fib with ventricular pacing  DEVICE  Normal device function.  See PaceArt for details.   Assess/Plan:

## 2015-01-11 DIAGNOSIS — D649 Anemia, unspecified: Secondary | ICD-10-CM | POA: Diagnosis not present

## 2015-02-04 DIAGNOSIS — E785 Hyperlipidemia, unspecified: Secondary | ICD-10-CM | POA: Diagnosis not present

## 2015-02-04 DIAGNOSIS — Z87891 Personal history of nicotine dependence: Secondary | ICD-10-CM | POA: Diagnosis not present

## 2015-02-04 DIAGNOSIS — Z7901 Long term (current) use of anticoagulants: Secondary | ICD-10-CM | POA: Diagnosis not present

## 2015-02-04 DIAGNOSIS — K219 Gastro-esophageal reflux disease without esophagitis: Secondary | ICD-10-CM | POA: Diagnosis not present

## 2015-02-04 DIAGNOSIS — Z794 Long term (current) use of insulin: Secondary | ICD-10-CM | POA: Diagnosis not present

## 2015-02-04 DIAGNOSIS — M19041 Primary osteoarthritis, right hand: Secondary | ICD-10-CM | POA: Diagnosis not present

## 2015-02-04 DIAGNOSIS — I4891 Unspecified atrial fibrillation: Secondary | ICD-10-CM | POA: Diagnosis not present

## 2015-02-04 DIAGNOSIS — I509 Heart failure, unspecified: Secondary | ICD-10-CM | POA: Diagnosis not present

## 2015-02-04 DIAGNOSIS — Z95810 Presence of automatic (implantable) cardiac defibrillator: Secondary | ICD-10-CM | POA: Diagnosis not present

## 2015-02-04 DIAGNOSIS — Z7982 Long term (current) use of aspirin: Secondary | ICD-10-CM | POA: Diagnosis not present

## 2015-02-04 DIAGNOSIS — I129 Hypertensive chronic kidney disease with stage 1 through stage 4 chronic kidney disease, or unspecified chronic kidney disease: Secondary | ICD-10-CM | POA: Diagnosis not present

## 2015-02-04 DIAGNOSIS — E039 Hypothyroidism, unspecified: Secondary | ICD-10-CM | POA: Diagnosis not present

## 2015-02-04 DIAGNOSIS — Z6829 Body mass index (BMI) 29.0-29.9, adult: Secondary | ICD-10-CM | POA: Diagnosis not present

## 2015-02-04 DIAGNOSIS — M19042 Primary osteoarthritis, left hand: Secondary | ICD-10-CM | POA: Diagnosis not present

## 2015-02-04 DIAGNOSIS — E119 Type 2 diabetes mellitus without complications: Secondary | ICD-10-CM | POA: Diagnosis not present

## 2015-03-05 DIAGNOSIS — M545 Low back pain: Secondary | ICD-10-CM | POA: Diagnosis not present

## 2015-03-05 DIAGNOSIS — E782 Mixed hyperlipidemia: Secondary | ICD-10-CM | POA: Diagnosis not present

## 2015-03-05 DIAGNOSIS — E1143 Type 2 diabetes mellitus with diabetic autonomic (poly)neuropathy: Secondary | ICD-10-CM | POA: Diagnosis not present

## 2015-03-05 DIAGNOSIS — Z95810 Presence of automatic (implantable) cardiac defibrillator: Secondary | ICD-10-CM | POA: Diagnosis not present

## 2015-03-05 DIAGNOSIS — E119 Type 2 diabetes mellitus without complications: Secondary | ICD-10-CM | POA: Diagnosis not present

## 2015-03-05 DIAGNOSIS — I10 Essential (primary) hypertension: Secondary | ICD-10-CM | POA: Diagnosis not present

## 2015-03-05 DIAGNOSIS — I110 Hypertensive heart disease with heart failure: Secondary | ICD-10-CM | POA: Diagnosis not present

## 2015-03-05 DIAGNOSIS — I481 Persistent atrial fibrillation: Secondary | ICD-10-CM | POA: Diagnosis not present

## 2015-03-05 DIAGNOSIS — D6489 Other specified anemias: Secondary | ICD-10-CM | POA: Diagnosis not present

## 2015-03-05 DIAGNOSIS — E780 Pure hypercholesterolemia: Secondary | ICD-10-CM | POA: Diagnosis not present

## 2015-03-05 DIAGNOSIS — M791 Myalgia: Secondary | ICD-10-CM | POA: Diagnosis not present

## 2015-03-05 DIAGNOSIS — E538 Deficiency of other specified B group vitamins: Secondary | ICD-10-CM | POA: Diagnosis not present

## 2015-03-05 DIAGNOSIS — D649 Anemia, unspecified: Secondary | ICD-10-CM | POA: Diagnosis not present

## 2015-03-22 DIAGNOSIS — Z1211 Encounter for screening for malignant neoplasm of colon: Secondary | ICD-10-CM | POA: Diagnosis not present

## 2015-03-23 ENCOUNTER — Ambulatory Visit (INDEPENDENT_AMBULATORY_CARE_PROVIDER_SITE_OTHER): Payer: Commercial Managed Care - HMO | Admitting: *Deleted

## 2015-03-23 DIAGNOSIS — I5022 Chronic systolic (congestive) heart failure: Secondary | ICD-10-CM | POA: Diagnosis not present

## 2015-03-23 DIAGNOSIS — I429 Cardiomyopathy, unspecified: Secondary | ICD-10-CM | POA: Diagnosis not present

## 2015-03-23 DIAGNOSIS — I428 Other cardiomyopathies: Secondary | ICD-10-CM

## 2015-03-23 NOTE — Progress Notes (Signed)
Remote ICD transmission.   

## 2015-03-28 LAB — CUP PACEART REMOTE DEVICE CHECK
Battery Remaining Longevity: 32 mo
Battery Voltage: 2.94 V
Brady Statistic AP VP Percent: 0 %
Brady Statistic AP VS Percent: 0 %
Brady Statistic AS VP Percent: 99.02 %
Brady Statistic AS VS Percent: 0.98 %
Brady Statistic RA Percent Paced: 0 %
Brady Statistic RV Percent Paced: 99.17 %
Date Time Interrogation Session: 20160614083725
HighPow Impedance: 46 Ohm
HighPow Impedance: 57 Ohm
Lead Channel Impedance Value: 304 Ohm
Lead Channel Impedance Value: 342 Ohm
Lead Channel Impedance Value: 418 Ohm
Lead Channel Impedance Value: 456 Ohm
Lead Channel Impedance Value: 532 Ohm
Lead Channel Impedance Value: 703 Ohm
Lead Channel Pacing Threshold Amplitude: 0.75 V
Lead Channel Pacing Threshold Amplitude: 3 V
Lead Channel Pacing Threshold Pulse Width: 0.4 ms
Lead Channel Pacing Threshold Pulse Width: 1 ms
Lead Channel Sensing Intrinsic Amplitude: 0.25 mV
Lead Channel Sensing Intrinsic Amplitude: 0.25 mV
Lead Channel Sensing Intrinsic Amplitude: 6.375 mV
Lead Channel Sensing Intrinsic Amplitude: 8.75 mV
Lead Channel Setting Pacing Amplitude: 2.5 V
Lead Channel Setting Pacing Amplitude: 3.75 V
Lead Channel Setting Pacing Pulse Width: 0.4 ms
Lead Channel Setting Pacing Pulse Width: 1 ms
Lead Channel Setting Sensing Sensitivity: 0.3 mV
Zone Setting Detection Interval: 300 ms
Zone Setting Detection Interval: 360 ms
Zone Setting Detection Interval: 400 ms
Zone Setting Detection Interval: 450 ms

## 2015-03-31 ENCOUNTER — Encounter: Payer: Self-pay | Admitting: Cardiology

## 2015-04-02 ENCOUNTER — Encounter: Payer: Self-pay | Admitting: Internal Medicine

## 2015-04-28 ENCOUNTER — Encounter: Payer: Commercial Managed Care - HMO | Admitting: Internal Medicine

## 2015-06-10 DIAGNOSIS — I481 Persistent atrial fibrillation: Secondary | ICD-10-CM | POA: Diagnosis not present

## 2015-06-10 DIAGNOSIS — E782 Mixed hyperlipidemia: Secondary | ICD-10-CM | POA: Diagnosis not present

## 2015-06-10 DIAGNOSIS — I10 Essential (primary) hypertension: Secondary | ICD-10-CM | POA: Diagnosis not present

## 2015-06-10 DIAGNOSIS — M545 Low back pain: Secondary | ICD-10-CM | POA: Diagnosis not present

## 2015-06-10 DIAGNOSIS — G458 Other transient cerebral ischemic attacks and related syndromes: Secondary | ICD-10-CM | POA: Diagnosis not present

## 2015-06-10 DIAGNOSIS — E1143 Type 2 diabetes mellitus with diabetic autonomic (poly)neuropathy: Secondary | ICD-10-CM | POA: Diagnosis not present

## 2015-06-10 DIAGNOSIS — E1165 Type 2 diabetes mellitus with hyperglycemia: Secondary | ICD-10-CM | POA: Diagnosis not present

## 2015-06-10 DIAGNOSIS — E780 Pure hypercholesterolemia: Secondary | ICD-10-CM | POA: Diagnosis not present

## 2015-06-10 DIAGNOSIS — Z95810 Presence of automatic (implantable) cardiac defibrillator: Secondary | ICD-10-CM | POA: Diagnosis not present

## 2015-06-10 DIAGNOSIS — I110 Hypertensive heart disease with heart failure: Secondary | ICD-10-CM | POA: Diagnosis not present

## 2015-06-13 IMAGING — CR DG CHEST 2V
2 series · 2 of 2 positions shown · non-contrast
Comparison: PA and lateral chest x-ray April 20, 2014

CLINICAL DATA: Two weeks of shortness of breath, chronic atrial
fibrillation, nonsmoker

EXAM:
CHEST  2 VIEW

[w chest pa]
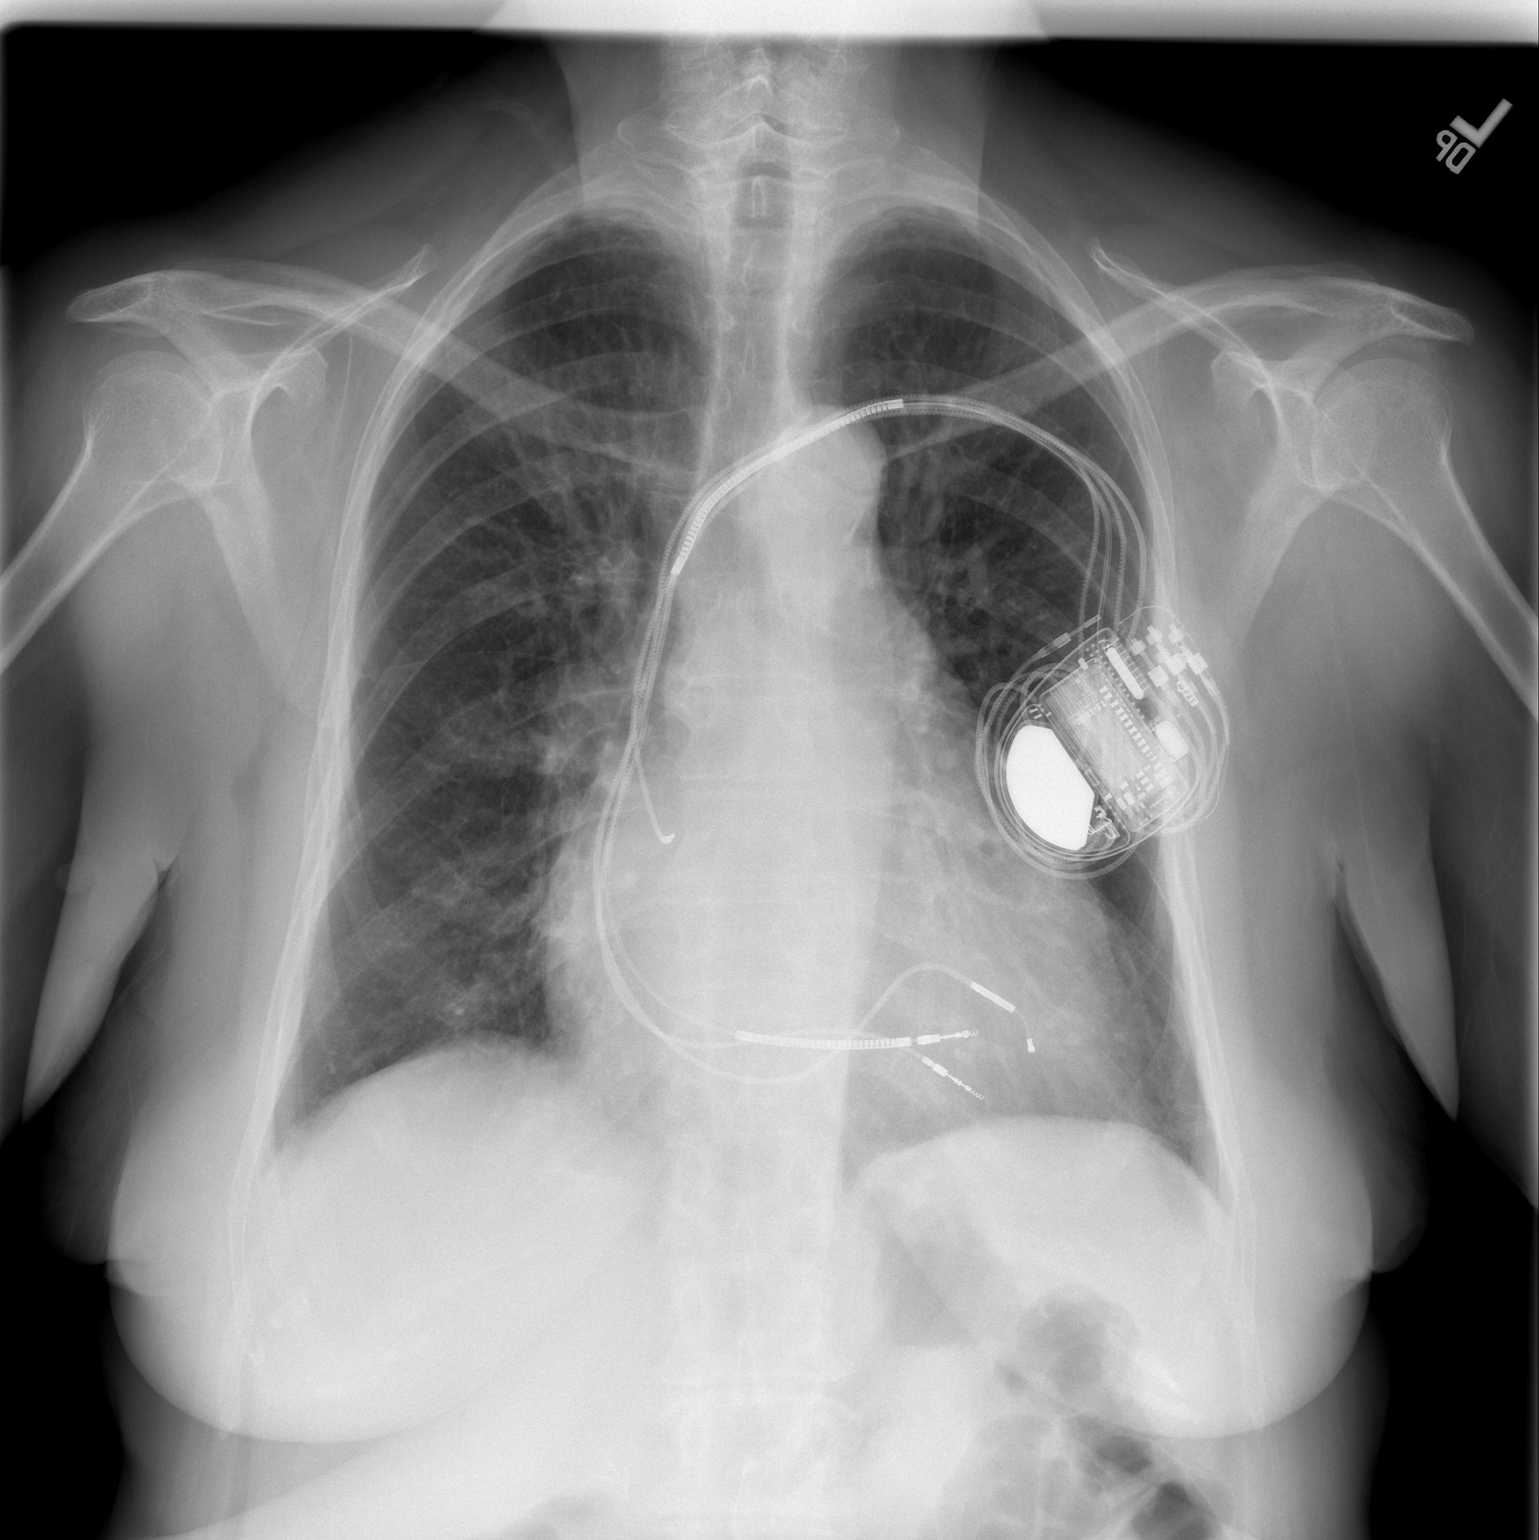

[w chest lat]
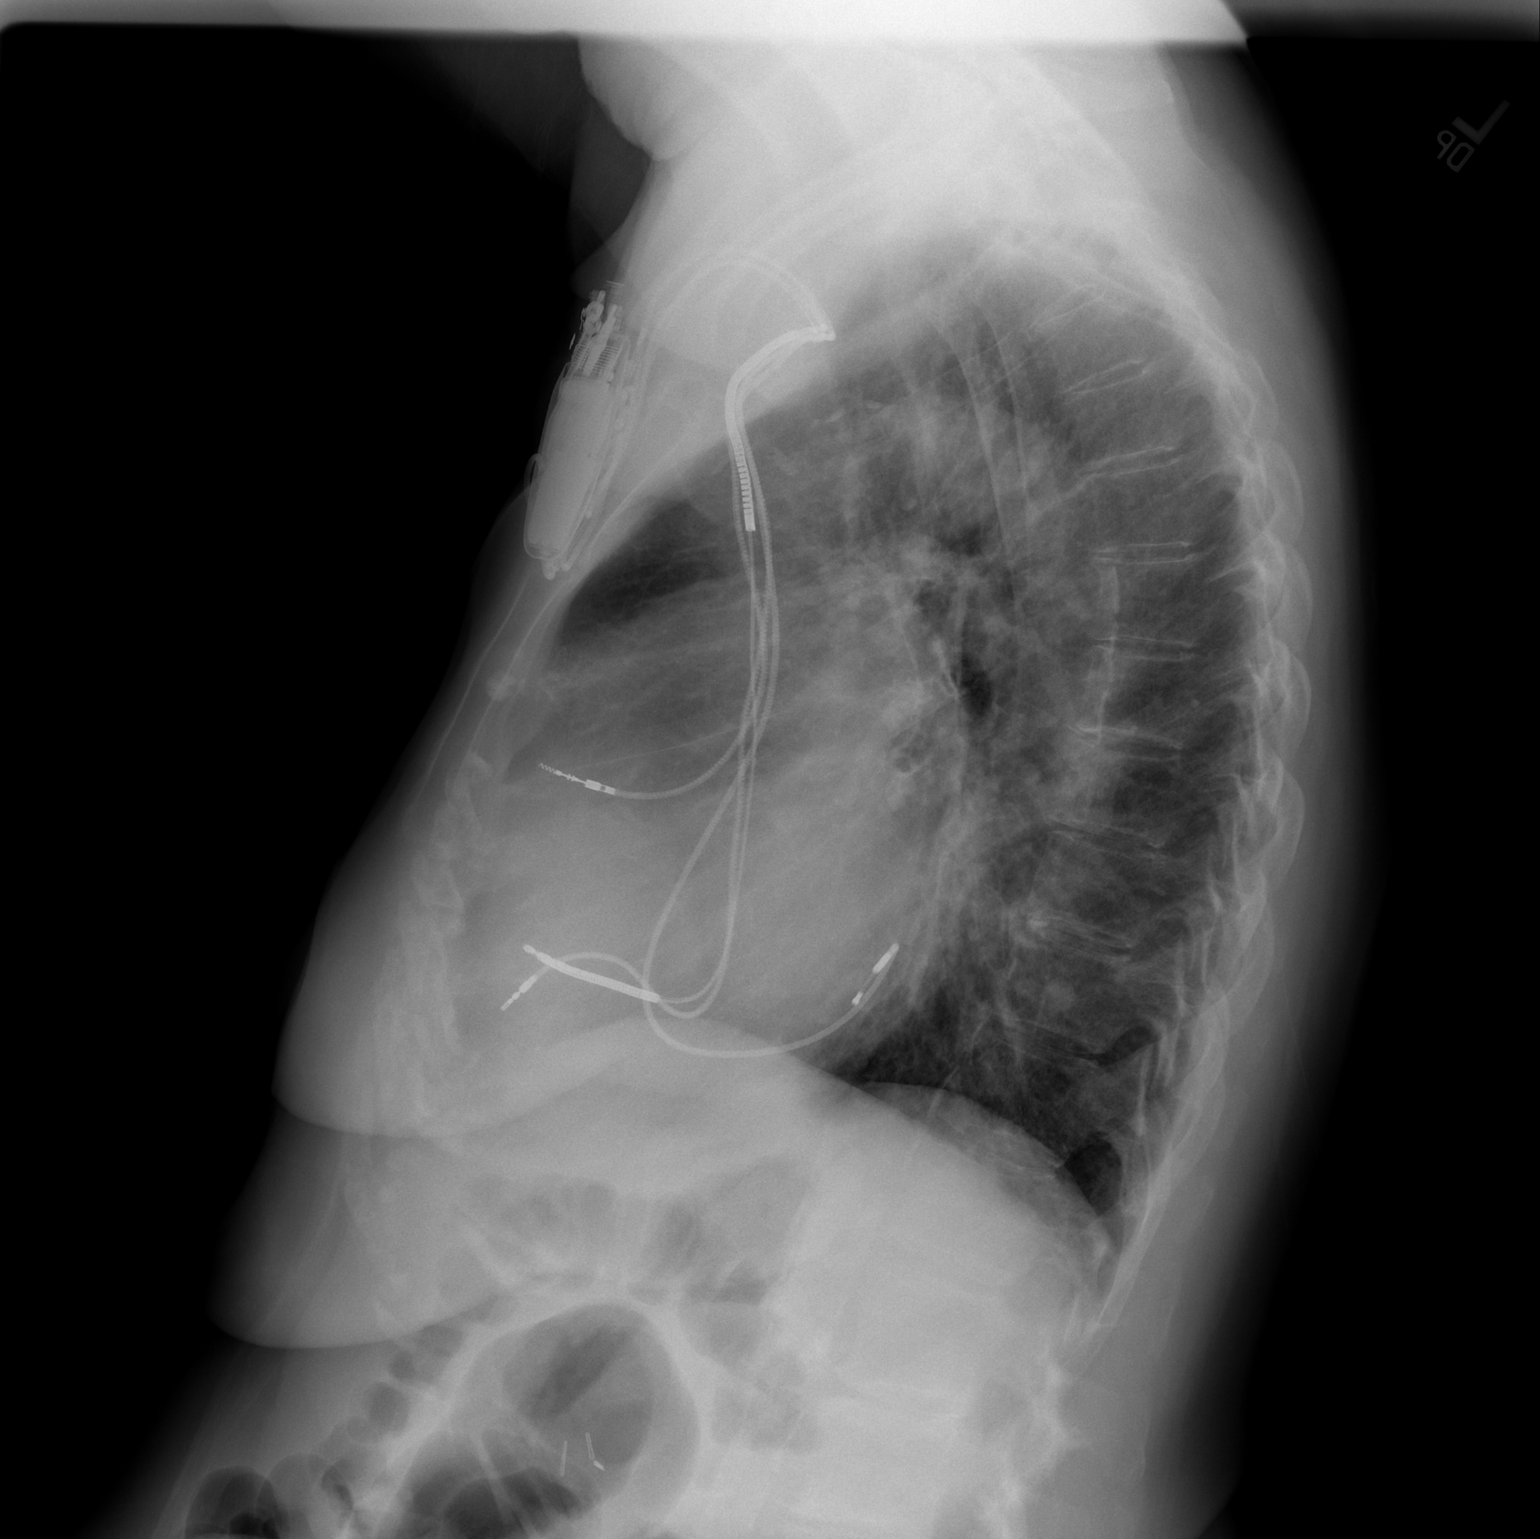

[2 of 2 positions shown; findings below may reference images not displayed]

FINDINGS: The lungs are well-expanded. There is no focal infiltrate or pleural
effusion. The cardiac silhouette is enlarged. The pulmonary
vascularity is prominent centrally with mild cephalization of the
vascular pattern the permanent pacemaker defibrillator is unchanged
in position. The trachea is midline. The bony thorax exhibits no
acute abnormality.
IMPRESSION: Cardiomegaly with mild pulmonary vascular congestion more prominent
than on the previous exam consistent with mild CHF. There is likely
underlying COPD.

## 2015-06-15 DIAGNOSIS — G459 Transient cerebral ischemic attack, unspecified: Secondary | ICD-10-CM | POA: Diagnosis not present

## 2015-06-15 DIAGNOSIS — R200 Anesthesia of skin: Secondary | ICD-10-CM | POA: Diagnosis not present

## 2015-06-15 DIAGNOSIS — I6523 Occlusion and stenosis of bilateral carotid arteries: Secondary | ICD-10-CM | POA: Diagnosis not present

## 2015-06-15 DIAGNOSIS — R0602 Shortness of breath: Secondary | ICD-10-CM | POA: Diagnosis not present

## 2015-06-18 ENCOUNTER — Telehealth: Payer: Self-pay | Admitting: Internal Medicine

## 2015-06-18 DIAGNOSIS — H524 Presbyopia: Secondary | ICD-10-CM | POA: Diagnosis not present

## 2015-06-18 DIAGNOSIS — H2513 Age-related nuclear cataract, bilateral: Secondary | ICD-10-CM | POA: Diagnosis not present

## 2015-06-18 NOTE — Telephone Encounter (Signed)
New problem    Pt has abnormal doppler which was 70% stenosis. Please advise what next step would be.

## 2015-06-18 NOTE — Telephone Encounter (Signed)
Returned call to Janett Billow at [facility].  She stated that the patient had just had a carotid artery doppler, at [facility], with > 70% stenosis on right side and 50% on left side.  They are wanting to get her an appt to have these results evaluated.  She will fax [name] the results.

## 2015-06-22 ENCOUNTER — Ambulatory Visit (INDEPENDENT_AMBULATORY_CARE_PROVIDER_SITE_OTHER): Payer: Commercial Managed Care - HMO | Admitting: *Deleted

## 2015-06-22 DIAGNOSIS — I5022 Chronic systolic (congestive) heart failure: Secondary | ICD-10-CM | POA: Diagnosis not present

## 2015-06-22 DIAGNOSIS — I428 Other cardiomyopathies: Secondary | ICD-10-CM

## 2015-06-22 DIAGNOSIS — I429 Cardiomyopathy, unspecified: Secondary | ICD-10-CM | POA: Diagnosis not present

## 2015-06-22 NOTE — Telephone Encounter (Signed)
Schedule a followup with me in 4-6 weeks. Continue blood thinners. GT

## 2015-06-22 NOTE — Progress Notes (Signed)
Remote ICD transmission.   

## 2015-06-24 DIAGNOSIS — M722 Plantar fascial fibromatosis: Secondary | ICD-10-CM | POA: Diagnosis not present

## 2015-06-24 DIAGNOSIS — E119 Type 2 diabetes mellitus without complications: Secondary | ICD-10-CM | POA: Diagnosis not present

## 2015-07-01 LAB — CUP PACEART REMOTE DEVICE CHECK
Battery Remaining Longevity: 32 mo
Battery Voltage: 2.93 V
Brady Statistic AP VP Percent: 0 %
Brady Statistic AP VS Percent: 0 %
Brady Statistic AS VP Percent: 98.33 %
Brady Statistic AS VS Percent: 1.67 %
Brady Statistic RA Percent Paced: 0 %
Brady Statistic RV Percent Paced: 98.55 %
Date Time Interrogation Session: 20160913041604
HighPow Impedance: 51 Ohm
HighPow Impedance: 64 Ohm
Implantable Lead Implant Date: 20080908
Implantable Lead Implant Date: 20080908
Implantable Lead Implant Date: 20120207
Implantable Lead Location: 753858
Implantable Lead Location: 753859
Implantable Lead Location: 753860
Implantable Lead Model: 4194
Implantable Lead Model: 5076
Implantable Lead Model: 7121
Lead Channel Impedance Value: 342 Ohm
Lead Channel Impedance Value: 361 Ohm
Lead Channel Impedance Value: 475 Ohm
Lead Channel Impedance Value: 513 Ohm
Lead Channel Impedance Value: 532 Ohm
Lead Channel Impedance Value: 760 Ohm
Lead Channel Pacing Threshold Amplitude: 0.625 V
Lead Channel Pacing Threshold Amplitude: 2.75 V
Lead Channel Pacing Threshold Pulse Width: 0.4 ms
Lead Channel Pacing Threshold Pulse Width: 1 ms
Lead Channel Sensing Intrinsic Amplitude: 0.375 mV
Lead Channel Sensing Intrinsic Amplitude: 0.375 mV
Lead Channel Sensing Intrinsic Amplitude: 6.375 mV
Lead Channel Sensing Intrinsic Amplitude: 8.75 mV
Lead Channel Setting Pacing Amplitude: 2.5 V
Lead Channel Setting Pacing Amplitude: 3.5 V
Lead Channel Setting Pacing Pulse Width: 0.4 ms
Lead Channel Setting Pacing Pulse Width: 1 ms
Lead Channel Setting Sensing Sensitivity: 0.3 mV
Zone Setting Detection Interval: 300 ms
Zone Setting Detection Interval: 400 ms

## 2015-07-05 DIAGNOSIS — Z0100 Encounter for examination of eyes and vision without abnormal findings: Secondary | ICD-10-CM | POA: Diagnosis not present

## 2015-07-16 ENCOUNTER — Encounter: Payer: Self-pay | Admitting: Cardiology

## 2015-07-28 ENCOUNTER — Encounter: Payer: Self-pay | Admitting: Internal Medicine

## 2015-07-29 ENCOUNTER — Encounter: Payer: Self-pay | Admitting: Internal Medicine

## 2015-08-03 ENCOUNTER — Ambulatory Visit (INDEPENDENT_AMBULATORY_CARE_PROVIDER_SITE_OTHER): Payer: Commercial Managed Care - HMO | Admitting: Internal Medicine

## 2015-08-03 ENCOUNTER — Encounter: Payer: Self-pay | Admitting: Internal Medicine

## 2015-08-03 VITALS — BP 130/82 | HR 87 | Ht 68.0 in | Wt 199.0 lb

## 2015-08-03 DIAGNOSIS — I482 Chronic atrial fibrillation, unspecified: Secondary | ICD-10-CM

## 2015-08-03 DIAGNOSIS — I5022 Chronic systolic (congestive) heart failure: Secondary | ICD-10-CM | POA: Diagnosis not present

## 2015-08-03 DIAGNOSIS — I4901 Ventricular fibrillation: Secondary | ICD-10-CM | POA: Diagnosis not present

## 2015-08-03 DIAGNOSIS — Z95810 Presence of automatic (implantable) cardiac defibrillator: Secondary | ICD-10-CM | POA: Diagnosis not present

## 2015-08-03 LAB — CUP PACEART INCLINIC DEVICE CHECK
Battery Remaining Longevity: 28 mo
Battery Voltage: 2.93 V
Brady Statistic AP VP Percent: 0 %
Brady Statistic AP VS Percent: 0 %
Brady Statistic AS VP Percent: 98.87 %
Brady Statistic AS VS Percent: 1.13 %
Brady Statistic RA Percent Paced: 0 %
Brady Statistic RV Percent Paced: 99.02 %
Date Time Interrogation Session: 20161025125259
HighPow Impedance: 47 Ohm
HighPow Impedance: 63 Ohm
Implantable Lead Implant Date: 20080908
Implantable Lead Implant Date: 20080908
Implantable Lead Implant Date: 20120207
Implantable Lead Location: 753858
Implantable Lead Location: 753859
Implantable Lead Location: 753860
Implantable Lead Model: 4194
Implantable Lead Model: 5076
Implantable Lead Model: 7121
Lead Channel Impedance Value: 304 Ohm
Lead Channel Impedance Value: 361 Ohm
Lead Channel Impedance Value: 475 Ohm
Lead Channel Impedance Value: 513 Ohm
Lead Channel Impedance Value: 551 Ohm
Lead Channel Impedance Value: 779 Ohm
Lead Channel Pacing Threshold Amplitude: 0.625 V
Lead Channel Pacing Threshold Amplitude: 3 V
Lead Channel Pacing Threshold Pulse Width: 0.4 ms
Lead Channel Pacing Threshold Pulse Width: 1 ms
Lead Channel Sensing Intrinsic Amplitude: 0.25 mV
Lead Channel Sensing Intrinsic Amplitude: 0.375 mV
Lead Channel Sensing Intrinsic Amplitude: 6.375 mV
Lead Channel Sensing Intrinsic Amplitude: 8 mV
Lead Channel Setting Pacing Amplitude: 2.5 V
Lead Channel Setting Pacing Amplitude: 3.5 V
Lead Channel Setting Pacing Pulse Width: 0.4 ms
Lead Channel Setting Pacing Pulse Width: 1 ms
Lead Channel Setting Sensing Sensitivity: 0.3 mV

## 2015-08-03 NOTE — Assessment & Plan Note (Signed)
Her ventricular rate is well controlled. No change in her meds.

## 2015-08-03 NOTE — Patient Instructions (Signed)
Medication Instructions:  Your physician recommends that you continue on your current medications as directed. Please refer to the Current Medication list given to you today.   Labwork: None ordered   Testing/Procedures: None ordered   Follow-Up: Your physician wants you to follow-up in: 12 months with Dr Knox Saliva will receive a reminder letter in the mail two months in advance. If you don't receive a letter, please call our office to schedule the follow-up appointment.  Remote monitoring is used to monitor your  ICD from home. This monitoring reduces the number of office visits required to check your device to one time per year. It allows [name] to keep an eye on the functioning of your device to ensure it is working properly. You are scheduled for a device check from home on 11/02/15. You may send your transmission at any time that day. If you have a wireless device, the transmission will be sent automatically. After your physician reviews your transmission, you will receive a postcard with your next transmission date.    Any Other Special Instructions Will Be Listed Below (If Applicable).     If you need a refill on your cardiac medications before your next appointment, please call your pharmacy.

## 2015-08-03 NOTE — Progress Notes (Signed)
HPI [name] returns today for ongoing evaluation and management of her ICD. She is a very pleasant 81 yo woman who I initially met approx. 9 years ago when she was referred for BiV PM insertion and AV node ablation which were successfully performed. She has now had chronic atrial fibrillation. Her heart failure symptoms are class 2. She has not had any ICD shocks. She notes mild peripheral edema. She has not had syncope.  In addition, the patient had a history of polymorphic ventricular tachycardia. Since her device was placed she has been stable. She has had her son with her now and he is improving slowly.  Allergies  Allergen Reactions  . Lyrica [Pregabalin]     Swelling   . Neurontin [Gabapentin]     Swelling   . Morphine Nausea Only  . Lisinopril     Rash   . Penicillins     Rash   . Tape Rash    TAPE     Current Outpatient Prescriptions  Medication Sig Dispense Refill  . aspirin 81 MG tablet Take 81 mg by mouth daily.     . digoxin (LANOXIN) 0.125 MG tablet Take 0.125 mg by mouth daily.     Marland Kitchen ezetimibe (ZETIA) 10 MG tablet Take 10 mg by mouth daily.    . fluticasone (FLONASE) 50 MCG/ACT nasal spray Place 2 sprays into both nostrils daily as needed for allergies or rhinitis.     . furosemide (LASIX) 40 MG tablet Take 40 mg by mouth daily. May take one extra lasix by mouth daily as needed for swelling    . glipiZIDE (GLUCOTROL) 5 MG tablet Take 5 mg by mouth 2 (two) times daily before a meal.    . Homeopathic Products ([employer]S LEG CRAMPS PAIN RELIEF) TABS Take 1 tablet by mouth daily as needed (for leg cramps).     . Insulin Detemir (LEVEMIR FLEXPEN) 100 UNIT/ML Pen Inject 30 Units into the skin at bedtime.    Marland Kitchen levothyroxine (SYNTHROID, LEVOTHROID) 50 MCG tablet Take 50 mcg by mouth daily.    Marland Kitchen losartan (COZAAR) 100 MG tablet Take 1 tablet by mouth daily.    . magnesium oxide (MAG-OX) 400 MG tablet Take 400 mg by mouth 2 (two) times daily.    . metFORMIN (GLUCOPHAGE)  1000 MG tablet Take 1,000 mg by mouth 2 (two) times daily.     . metoprolol succinate (TOPROL-XL) 25 MG 24 hr tablet Take 1 tablet (25 mg total) by mouth daily. 30 tablet 11  . Multiple Vitamins-Minerals (CENTRUM SILVER PO) Take 1 capsule by mouth daily.    Marland Kitchen omega-3 acid ethyl esters (LOVAZA) 1 G capsule Take 2 g by mouth 2 (two) times daily.     Marland Kitchen oxyCODONE-acetaminophen (PERCOCET) 7.5-325 MG per tablet Take 1 tablet by mouth every 4 (four) hours as needed for pain.    . pantoprazole (PROTONIX) 40 MG tablet Take 40 mg by mouth 2 (two) times daily.    . potassium chloride SA (K-DUR,KLOR-CON) 20 MEQ tablet Take 2 tablets by mouth daily.    . pravastatin (PRAVACHOL) 20 MG tablet Take 20 mg by mouth at bedtime.    . Rivaroxaban (XARELTO) 15 MG TABS tablet Take 1 tablet (15 mg total) by mouth daily with supper. 30 tablet 11  . rOPINIRole (REQUIP) 1 MG tablet Take 1 tablet by mouth daily.     No current facility-administered medications for this visit.     Past Medical History  Diagnosis Date  . Chronic a-fib ([place])     on xarelto; failed DCCV  . Biventricular ICD (implantable cardiac defibrillator) in place     BiV ICD for nonischemic cardiomyopathy; BiV responder  . Anemia     no GI bleeding; on iron  . VT (ventricular tachycardia) ([place]) 9/11    polymorphic VT probably related to sotalol therapy  . 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 Renal insufficiency, mild     05/01/12 Creat 1.47  . H/O echocardiogram 07/05/2010    EF 45-50%; aortic valve-mildly sclerotic; LA-mod dilaterd   . Hyperlipemia   . Diabetes ([place])   . H/O cardiovascular stress test 08/04/2010    normal imaging; EF 61%  . LBBB (left bundle branch block)   . RAS (renal artery stenosis) ([place])     a. renal dopp (12/18/08)- Right RA-<60%; L RA stent- open; nl size and shape in both kidneys;  b.  Rena Artery [name] (3/16):  SMA with > 70% stenosis, Bilateral prox RA with  1-59%  . PVD (peripheral vascular disease) ([place])     L RA stent 1994    ROS:   All systems reviewed and negative except as noted in the HPI.   Past Surgical History  Procedure Laterality Date  . Cholecystectomy      Dr. Sherald Hess in [place] and Dr. Lyda Jester follows; surgical stricture post procedure with stent placement  . Brain meningioma excision  2011    L frontal [facility] at [facility]   . Biv icd genertaor change out  11/15/10    Medtronic Protecta [id number] serial [id number]  . Av node ablation  06/08/2006    performed by Dr. Beckie Salts; due to Afib with RVR and tachy brady syndrome  . Biv icd placement  06/17/2007; 04/20/14    Medtronic Concerta [id number]; RA lead-Med 5076/53cm, [id number]; RV lead- SJM 7121/65cm, [id number]; LV lead- Med 4194/88cm, [id number]; gen change 04/2014 by Dr Lovena Le  . Cardioversion  07/21/05    converted to sinus brady  . Pv angio  1994    L Renal artery stent   . Biv pacemaker generator change out N/A 04/20/2014    Procedure: BIV PACEMAKER GENERATOR CHANGE OUT;  Surgeon: Evans Lance, MD;  Location: [facility] CATH LAB;  Service: Cardiovascular;  Laterality: N/A;     Family History  Problem Relation Age of Onset  . Dementia Sister   . Heart attack Brother   . Stroke Maternal Grandmother   . Heart failure Mother      Social History   Social History  . Marital Status: Married    Spouse Name: N/A  . Number of Children: N/A  . Years of Education: N/A   Occupational History  . Not on file.   Social History Main Topics  . Smoking status: Never Smoker   . Smokeless tobacco: Not on file  . Alcohol Use: No  . Drug Use: No  . Sexual Activity: Not on file   Other Topics Concern  . Not on file   Social History Narrative     BP 130/82 mmHg  Pulse 87  Ht $R'5\' 8"'ic$  (1.727 m)  Wt 199 lb (90.266 kg)  BMI 30.26 kg/m2  Physical Exam:  Well appearing 81 yo woman, NAD HEENT: Unremarkable Neck:  6 cm JVD, no thyromegally Back:  No CVA  tenderness Lungs:  Clear with no wheezes, rales, or rhonchi, well-healed ICD incision. HEART:  Regular  rate rhythm, no murmurs, no rubs, no clicks Abd:  soft, positive bowel sounds, no organomegally, no rebound, no guarding Ext:  2 plus pulses, no edema, no cyanosis, no clubbing Skin:  No rashes no nodules Neuro:  CN II through XII intact, motor grossly intact  EKG - atrial fib with bi-ventricular pacing  DEVICE  Normal device function.  See PaceArt for details.   Assess/Plan:

## 2015-08-03 NOTE — Assessment & Plan Note (Signed)
Her device is working normally. No diaphragmatic stimulation. Will follow.

## 2015-08-03 NOTE — Assessment & Plan Note (Signed)
No ICD shocks. Will follow.

## 2015-08-29 DIAGNOSIS — I4891 Unspecified atrial fibrillation: Secondary | ICD-10-CM | POA: Diagnosis not present

## 2015-08-29 DIAGNOSIS — R0602 Shortness of breath: Secondary | ICD-10-CM | POA: Diagnosis not present

## 2015-08-29 DIAGNOSIS — I10 Essential (primary) hypertension: Secondary | ICD-10-CM | POA: Diagnosis not present

## 2015-08-29 DIAGNOSIS — E7800 Pure hypercholesterolemia, unspecified: Secondary | ICD-10-CM | POA: Diagnosis not present

## 2015-08-29 DIAGNOSIS — E039 Hypothyroidism, unspecified: Secondary | ICD-10-CM | POA: Diagnosis not present

## 2015-08-29 DIAGNOSIS — I5023 Acute on chronic systolic (congestive) heart failure: Secondary | ICD-10-CM | POA: Diagnosis not present

## 2015-08-29 DIAGNOSIS — I509 Heart failure, unspecified: Secondary | ICD-10-CM | POA: Diagnosis not present

## 2015-08-29 DIAGNOSIS — Z9981 Dependence on supplemental oxygen: Secondary | ICD-10-CM | POA: Diagnosis not present

## 2015-08-29 DIAGNOSIS — Z86711 Personal history of pulmonary embolism: Secondary | ICD-10-CM | POA: Diagnosis not present

## 2015-08-29 DIAGNOSIS — E785 Hyperlipidemia, unspecified: Secondary | ICD-10-CM | POA: Diagnosis not present

## 2015-08-29 DIAGNOSIS — E119 Type 2 diabetes mellitus without complications: Secondary | ICD-10-CM | POA: Diagnosis not present

## 2015-08-29 DIAGNOSIS — I272 Other secondary pulmonary hypertension: Secondary | ICD-10-CM | POA: Diagnosis not present

## 2015-08-29 DIAGNOSIS — R0902 Hypoxemia: Secondary | ICD-10-CM | POA: Diagnosis not present

## 2015-08-30 DIAGNOSIS — R0902 Hypoxemia: Secondary | ICD-10-CM | POA: Diagnosis not present

## 2015-08-30 DIAGNOSIS — I5023 Acute on chronic systolic (congestive) heart failure: Secondary | ICD-10-CM | POA: Diagnosis not present

## 2015-08-30 DIAGNOSIS — I272 Other secondary pulmonary hypertension: Secondary | ICD-10-CM | POA: Diagnosis not present

## 2015-08-30 DIAGNOSIS — Z9981 Dependence on supplemental oxygen: Secondary | ICD-10-CM | POA: Diagnosis not present

## 2015-08-30 DIAGNOSIS — I509 Heart failure, unspecified: Secondary | ICD-10-CM | POA: Diagnosis not present

## 2015-08-30 DIAGNOSIS — E119 Type 2 diabetes mellitus without complications: Secondary | ICD-10-CM | POA: Diagnosis not present

## 2015-08-30 DIAGNOSIS — I4891 Unspecified atrial fibrillation: Secondary | ICD-10-CM | POA: Diagnosis not present

## 2015-08-31 DIAGNOSIS — I509 Heart failure, unspecified: Secondary | ICD-10-CM | POA: Diagnosis not present

## 2015-08-31 DIAGNOSIS — I4891 Unspecified atrial fibrillation: Secondary | ICD-10-CM | POA: Diagnosis not present

## 2015-08-31 DIAGNOSIS — R0902 Hypoxemia: Secondary | ICD-10-CM | POA: Diagnosis not present

## 2015-08-31 DIAGNOSIS — I5023 Acute on chronic systolic (congestive) heart failure: Secondary | ICD-10-CM | POA: Diagnosis not present

## 2015-08-31 DIAGNOSIS — E119 Type 2 diabetes mellitus without complications: Secondary | ICD-10-CM | POA: Diagnosis not present

## 2015-08-31 DIAGNOSIS — R0602 Shortness of breath: Secondary | ICD-10-CM | POA: Diagnosis not present

## 2015-08-31 DIAGNOSIS — Z9981 Dependence on supplemental oxygen: Secondary | ICD-10-CM | POA: Diagnosis not present

## 2015-09-01 DIAGNOSIS — Z86711 Personal history of pulmonary embolism: Secondary | ICD-10-CM | POA: Diagnosis not present

## 2015-09-01 DIAGNOSIS — I509 Heart failure, unspecified: Secondary | ICD-10-CM | POA: Diagnosis not present

## 2015-09-01 DIAGNOSIS — E119 Type 2 diabetes mellitus without complications: Secondary | ICD-10-CM | POA: Diagnosis not present

## 2015-09-01 DIAGNOSIS — Z9981 Dependence on supplemental oxygen: Secondary | ICD-10-CM | POA: Diagnosis not present

## 2015-09-01 DIAGNOSIS — E039 Hypothyroidism, unspecified: Secondary | ICD-10-CM | POA: Diagnosis not present

## 2015-09-01 DIAGNOSIS — I4891 Unspecified atrial fibrillation: Secondary | ICD-10-CM | POA: Diagnosis not present

## 2015-09-01 DIAGNOSIS — R0902 Hypoxemia: Secondary | ICD-10-CM | POA: Diagnosis not present

## 2015-09-01 DIAGNOSIS — E785 Hyperlipidemia, unspecified: Secondary | ICD-10-CM | POA: Diagnosis not present

## 2015-09-01 DIAGNOSIS — I10 Essential (primary) hypertension: Secondary | ICD-10-CM | POA: Diagnosis not present

## 2015-09-01 DIAGNOSIS — I5023 Acute on chronic systolic (congestive) heart failure: Secondary | ICD-10-CM | POA: Diagnosis not present

## 2015-09-01 DIAGNOSIS — E7800 Pure hypercholesterolemia, unspecified: Secondary | ICD-10-CM | POA: Diagnosis not present

## 2015-09-04 DIAGNOSIS — I110 Hypertensive heart disease with heart failure: Secondary | ICD-10-CM | POA: Diagnosis not present

## 2015-09-04 DIAGNOSIS — Z7984 Long term (current) use of oral hypoglycemic drugs: Secondary | ICD-10-CM | POA: Diagnosis not present

## 2015-09-04 DIAGNOSIS — I4891 Unspecified atrial fibrillation: Secondary | ICD-10-CM | POA: Diagnosis not present

## 2015-09-04 DIAGNOSIS — E039 Hypothyroidism, unspecified: Secondary | ICD-10-CM | POA: Diagnosis not present

## 2015-09-04 DIAGNOSIS — E785 Hyperlipidemia, unspecified: Secondary | ICD-10-CM | POA: Diagnosis not present

## 2015-09-04 DIAGNOSIS — Z794 Long term (current) use of insulin: Secondary | ICD-10-CM | POA: Diagnosis not present

## 2015-09-04 DIAGNOSIS — I5023 Acute on chronic systolic (congestive) heart failure: Secondary | ICD-10-CM | POA: Diagnosis not present

## 2015-09-04 DIAGNOSIS — E119 Type 2 diabetes mellitus without complications: Secondary | ICD-10-CM | POA: Diagnosis not present

## 2015-09-04 DIAGNOSIS — M545 Low back pain: Secondary | ICD-10-CM | POA: Diagnosis not present

## 2015-09-08 DIAGNOSIS — I110 Hypertensive heart disease with heart failure: Secondary | ICD-10-CM | POA: Diagnosis not present

## 2015-09-08 DIAGNOSIS — E119 Type 2 diabetes mellitus without complications: Secondary | ICD-10-CM | POA: Diagnosis not present

## 2015-09-08 DIAGNOSIS — E039 Hypothyroidism, unspecified: Secondary | ICD-10-CM | POA: Diagnosis not present

## 2015-09-08 DIAGNOSIS — M545 Low back pain: Secondary | ICD-10-CM | POA: Diagnosis not present

## 2015-09-08 DIAGNOSIS — I5023 Acute on chronic systolic (congestive) heart failure: Secondary | ICD-10-CM | POA: Diagnosis not present

## 2015-09-08 DIAGNOSIS — I4891 Unspecified atrial fibrillation: Secondary | ICD-10-CM | POA: Diagnosis not present

## 2015-09-08 DIAGNOSIS — Z794 Long term (current) use of insulin: Secondary | ICD-10-CM | POA: Diagnosis not present

## 2015-09-08 DIAGNOSIS — E785 Hyperlipidemia, unspecified: Secondary | ICD-10-CM | POA: Diagnosis not present

## 2015-09-08 DIAGNOSIS — Z7984 Long term (current) use of oral hypoglycemic drugs: Secondary | ICD-10-CM | POA: Diagnosis not present

## 2015-09-09 DIAGNOSIS — E039 Hypothyroidism, unspecified: Secondary | ICD-10-CM | POA: Diagnosis not present

## 2015-09-09 DIAGNOSIS — Z794 Long term (current) use of insulin: Secondary | ICD-10-CM | POA: Diagnosis not present

## 2015-09-09 DIAGNOSIS — E785 Hyperlipidemia, unspecified: Secondary | ICD-10-CM | POA: Diagnosis not present

## 2015-09-09 DIAGNOSIS — Z7984 Long term (current) use of oral hypoglycemic drugs: Secondary | ICD-10-CM | POA: Diagnosis not present

## 2015-09-09 DIAGNOSIS — E119 Type 2 diabetes mellitus without complications: Secondary | ICD-10-CM | POA: Diagnosis not present

## 2015-09-09 DIAGNOSIS — M545 Low back pain: Secondary | ICD-10-CM | POA: Diagnosis not present

## 2015-09-09 DIAGNOSIS — I110 Hypertensive heart disease with heart failure: Secondary | ICD-10-CM | POA: Diagnosis not present

## 2015-09-09 DIAGNOSIS — I5023 Acute on chronic systolic (congestive) heart failure: Secondary | ICD-10-CM | POA: Diagnosis not present

## 2015-09-09 DIAGNOSIS — I4891 Unspecified atrial fibrillation: Secondary | ICD-10-CM | POA: Diagnosis not present

## 2015-09-14 DIAGNOSIS — I5023 Acute on chronic systolic (congestive) heart failure: Secondary | ICD-10-CM | POA: Diagnosis not present

## 2015-09-14 DIAGNOSIS — Z794 Long term (current) use of insulin: Secondary | ICD-10-CM | POA: Diagnosis not present

## 2015-09-14 DIAGNOSIS — E119 Type 2 diabetes mellitus without complications: Secondary | ICD-10-CM | POA: Diagnosis not present

## 2015-09-14 DIAGNOSIS — Z7984 Long term (current) use of oral hypoglycemic drugs: Secondary | ICD-10-CM | POA: Diagnosis not present

## 2015-09-14 DIAGNOSIS — E039 Hypothyroidism, unspecified: Secondary | ICD-10-CM | POA: Diagnosis not present

## 2015-09-14 DIAGNOSIS — I110 Hypertensive heart disease with heart failure: Secondary | ICD-10-CM | POA: Diagnosis not present

## 2015-09-14 DIAGNOSIS — I4891 Unspecified atrial fibrillation: Secondary | ICD-10-CM | POA: Diagnosis not present

## 2015-09-14 DIAGNOSIS — M545 Low back pain: Secondary | ICD-10-CM | POA: Diagnosis not present

## 2015-09-14 DIAGNOSIS — E785 Hyperlipidemia, unspecified: Secondary | ICD-10-CM | POA: Diagnosis not present

## 2015-09-17 DIAGNOSIS — E782 Mixed hyperlipidemia: Secondary | ICD-10-CM | POA: Diagnosis not present

## 2015-09-17 DIAGNOSIS — E038 Other specified hypothyroidism: Secondary | ICD-10-CM | POA: Diagnosis not present

## 2015-09-17 DIAGNOSIS — I10 Essential (primary) hypertension: Secondary | ICD-10-CM | POA: Diagnosis not present

## 2015-09-17 DIAGNOSIS — E1143 Type 2 diabetes mellitus with diabetic autonomic (poly)neuropathy: Secondary | ICD-10-CM | POA: Diagnosis not present

## 2015-09-21 DIAGNOSIS — I4891 Unspecified atrial fibrillation: Secondary | ICD-10-CM | POA: Diagnosis not present

## 2015-09-21 DIAGNOSIS — E785 Hyperlipidemia, unspecified: Secondary | ICD-10-CM | POA: Diagnosis not present

## 2015-09-21 DIAGNOSIS — M545 Low back pain: Secondary | ICD-10-CM | POA: Diagnosis not present

## 2015-09-21 DIAGNOSIS — I110 Hypertensive heart disease with heart failure: Secondary | ICD-10-CM | POA: Diagnosis not present

## 2015-09-21 DIAGNOSIS — Z7984 Long term (current) use of oral hypoglycemic drugs: Secondary | ICD-10-CM | POA: Diagnosis not present

## 2015-09-21 DIAGNOSIS — E119 Type 2 diabetes mellitus without complications: Secondary | ICD-10-CM | POA: Diagnosis not present

## 2015-09-21 DIAGNOSIS — I5023 Acute on chronic systolic (congestive) heart failure: Secondary | ICD-10-CM | POA: Diagnosis not present

## 2015-09-21 DIAGNOSIS — Z794 Long term (current) use of insulin: Secondary | ICD-10-CM | POA: Diagnosis not present

## 2015-09-21 DIAGNOSIS — E039 Hypothyroidism, unspecified: Secondary | ICD-10-CM | POA: Diagnosis not present

## 2015-09-29 DIAGNOSIS — E119 Type 2 diabetes mellitus without complications: Secondary | ICD-10-CM | POA: Diagnosis not present

## 2015-09-29 DIAGNOSIS — E039 Hypothyroidism, unspecified: Secondary | ICD-10-CM | POA: Diagnosis not present

## 2015-09-29 DIAGNOSIS — M545 Low back pain: Secondary | ICD-10-CM | POA: Diagnosis not present

## 2015-09-29 DIAGNOSIS — I4891 Unspecified atrial fibrillation: Secondary | ICD-10-CM | POA: Diagnosis not present

## 2015-09-29 DIAGNOSIS — Z7984 Long term (current) use of oral hypoglycemic drugs: Secondary | ICD-10-CM | POA: Diagnosis not present

## 2015-09-29 DIAGNOSIS — Z794 Long term (current) use of insulin: Secondary | ICD-10-CM | POA: Diagnosis not present

## 2015-09-29 DIAGNOSIS — E785 Hyperlipidemia, unspecified: Secondary | ICD-10-CM | POA: Diagnosis not present

## 2015-09-29 DIAGNOSIS — I110 Hypertensive heart disease with heart failure: Secondary | ICD-10-CM | POA: Diagnosis not present

## 2015-09-29 DIAGNOSIS — I5023 Acute on chronic systolic (congestive) heart failure: Secondary | ICD-10-CM | POA: Diagnosis not present

## 2015-10-09 DIAGNOSIS — E119 Type 2 diabetes mellitus without complications: Secondary | ICD-10-CM | POA: Diagnosis not present

## 2015-10-09 DIAGNOSIS — I110 Hypertensive heart disease with heart failure: Secondary | ICD-10-CM | POA: Diagnosis not present

## 2015-10-09 DIAGNOSIS — M545 Low back pain: Secondary | ICD-10-CM | POA: Diagnosis not present

## 2015-10-09 DIAGNOSIS — E039 Hypothyroidism, unspecified: Secondary | ICD-10-CM | POA: Diagnosis not present

## 2015-10-09 DIAGNOSIS — I5023 Acute on chronic systolic (congestive) heart failure: Secondary | ICD-10-CM | POA: Diagnosis not present

## 2015-10-09 DIAGNOSIS — E785 Hyperlipidemia, unspecified: Secondary | ICD-10-CM | POA: Diagnosis not present

## 2015-10-09 DIAGNOSIS — Z794 Long term (current) use of insulin: Secondary | ICD-10-CM | POA: Diagnosis not present

## 2015-10-09 DIAGNOSIS — Z7984 Long term (current) use of oral hypoglycemic drugs: Secondary | ICD-10-CM | POA: Diagnosis not present

## 2015-10-09 DIAGNOSIS — I4891 Unspecified atrial fibrillation: Secondary | ICD-10-CM | POA: Diagnosis not present

## 2015-10-12 DIAGNOSIS — H40013 Open angle with borderline findings, low risk, bilateral: Secondary | ICD-10-CM | POA: Diagnosis not present

## 2015-10-12 DIAGNOSIS — H2512 Age-related nuclear cataract, left eye: Secondary | ICD-10-CM | POA: Diagnosis not present

## 2015-11-02 ENCOUNTER — Ambulatory Visit (INDEPENDENT_AMBULATORY_CARE_PROVIDER_SITE_OTHER): Payer: Commercial Managed Care - HMO | Admitting: *Deleted

## 2015-11-02 DIAGNOSIS — I5022 Chronic systolic (congestive) heart failure: Secondary | ICD-10-CM | POA: Diagnosis not present

## 2015-11-02 DIAGNOSIS — I10 Essential (primary) hypertension: Secondary | ICD-10-CM | POA: Diagnosis not present

## 2015-11-02 DIAGNOSIS — I4901 Ventricular fibrillation: Secondary | ICD-10-CM | POA: Diagnosis not present

## 2015-11-03 NOTE — Progress Notes (Signed)
Remote ICD transmission.   

## 2015-11-09 DIAGNOSIS — I10 Essential (primary) hypertension: Secondary | ICD-10-CM | POA: Diagnosis not present

## 2015-11-09 DIAGNOSIS — H2512 Age-related nuclear cataract, left eye: Secondary | ICD-10-CM | POA: Diagnosis not present

## 2015-11-09 DIAGNOSIS — G4730 Sleep apnea, unspecified: Secondary | ICD-10-CM | POA: Diagnosis not present

## 2015-11-09 DIAGNOSIS — I509 Heart failure, unspecified: Secondary | ICD-10-CM | POA: Diagnosis not present

## 2015-11-09 DIAGNOSIS — E039 Hypothyroidism, unspecified: Secondary | ICD-10-CM | POA: Diagnosis not present

## 2015-11-09 DIAGNOSIS — E785 Hyperlipidemia, unspecified: Secondary | ICD-10-CM | POA: Diagnosis not present

## 2015-11-09 DIAGNOSIS — E119 Type 2 diabetes mellitus without complications: Secondary | ICD-10-CM | POA: Diagnosis not present

## 2015-11-09 DIAGNOSIS — K219 Gastro-esophageal reflux disease without esophagitis: Secondary | ICD-10-CM | POA: Diagnosis not present

## 2015-11-09 DIAGNOSIS — I4891 Unspecified atrial fibrillation: Secondary | ICD-10-CM | POA: Diagnosis not present

## 2015-11-16 LAB — CUP PACEART REMOTE DEVICE CHECK
Battery Remaining Longevity: 16 mo
Battery Voltage: 2.92 V
Brady Statistic AP VP Percent: 0 %
Brady Statistic AP VS Percent: 0 %
Brady Statistic AS VP Percent: 99.54 %
Brady Statistic AS VS Percent: 0.46 %
Brady Statistic RA Percent Paced: 0 %
Brady Statistic RV Percent Paced: 99.67 %
Date Time Interrogation Session: 20170124094225
HighPow Impedance: 47 Ohm
HighPow Impedance: 57 Ohm
Implantable Lead Implant Date: 20080908
Implantable Lead Implant Date: 20080908
Implantable Lead Implant Date: 20120207
Implantable Lead Location: 753858
Implantable Lead Location: 753859
Implantable Lead Location: 753860
Implantable Lead Model: 4194
Implantable Lead Model: 5076
Implantable Lead Model: 7121
Lead Channel Impedance Value: 304 Ohm
Lead Channel Impedance Value: 361 Ohm
Lead Channel Impedance Value: 456 Ohm
Lead Channel Impedance Value: 475 Ohm
Lead Channel Impedance Value: 551 Ohm
Lead Channel Impedance Value: 779 Ohm
Lead Channel Pacing Threshold Amplitude: 0.75 V
Lead Channel Pacing Threshold Amplitude: 2.75 V
Lead Channel Pacing Threshold Pulse Width: 0.4 ms
Lead Channel Pacing Threshold Pulse Width: 1 ms
Lead Channel Sensing Intrinsic Amplitude: 0.25 mV
Lead Channel Sensing Intrinsic Amplitude: 0.25 mV
Lead Channel Sensing Intrinsic Amplitude: 6.375 mV
Lead Channel Sensing Intrinsic Amplitude: 8 mV
Lead Channel Setting Pacing Amplitude: 2.5 V
Lead Channel Setting Pacing Amplitude: 4.25 V
Lead Channel Setting Pacing Pulse Width: 0.4 ms
Lead Channel Setting Pacing Pulse Width: 1 ms
Lead Channel Setting Sensing Sensitivity: 0.3 mV

## 2015-11-19 ENCOUNTER — Encounter: Payer: Self-pay | Admitting: Cardiology

## 2015-12-07 DIAGNOSIS — H2511 Age-related nuclear cataract, right eye: Secondary | ICD-10-CM | POA: Diagnosis not present

## 2015-12-07 DIAGNOSIS — I4891 Unspecified atrial fibrillation: Secondary | ICD-10-CM | POA: Diagnosis not present

## 2015-12-07 DIAGNOSIS — E785 Hyperlipidemia, unspecified: Secondary | ICD-10-CM | POA: Diagnosis not present

## 2015-12-07 DIAGNOSIS — G4730 Sleep apnea, unspecified: Secondary | ICD-10-CM | POA: Diagnosis not present

## 2015-12-07 DIAGNOSIS — I509 Heart failure, unspecified: Secondary | ICD-10-CM | POA: Diagnosis not present

## 2015-12-07 DIAGNOSIS — E039 Hypothyroidism, unspecified: Secondary | ICD-10-CM | POA: Diagnosis not present

## 2015-12-07 DIAGNOSIS — I2510 Atherosclerotic heart disease of native coronary artery without angina pectoris: Secondary | ICD-10-CM | POA: Diagnosis not present

## 2015-12-07 DIAGNOSIS — E119 Type 2 diabetes mellitus without complications: Secondary | ICD-10-CM | POA: Diagnosis not present

## 2015-12-07 DIAGNOSIS — I110 Hypertensive heart disease with heart failure: Secondary | ICD-10-CM | POA: Diagnosis not present

## 2016-01-04 DIAGNOSIS — H524 Presbyopia: Secondary | ICD-10-CM | POA: Diagnosis not present

## 2016-01-13 DIAGNOSIS — Z961 Presence of intraocular lens: Secondary | ICD-10-CM | POA: Diagnosis not present

## 2016-01-20 DIAGNOSIS — J208 Acute bronchitis due to other specified organisms: Secondary | ICD-10-CM | POA: Diagnosis not present

## 2016-02-01 ENCOUNTER — Ambulatory Visit (INDEPENDENT_AMBULATORY_CARE_PROVIDER_SITE_OTHER): Payer: Commercial Managed Care - HMO | Admitting: *Deleted

## 2016-02-01 DIAGNOSIS — I428 Other cardiomyopathies: Secondary | ICD-10-CM

## 2016-02-01 DIAGNOSIS — Z95810 Presence of automatic (implantable) cardiac defibrillator: Secondary | ICD-10-CM

## 2016-02-01 DIAGNOSIS — I5022 Chronic systolic (congestive) heart failure: Secondary | ICD-10-CM | POA: Diagnosis not present

## 2016-02-01 DIAGNOSIS — I429 Cardiomyopathy, unspecified: Secondary | ICD-10-CM

## 2016-02-01 DIAGNOSIS — I4901 Ventricular fibrillation: Secondary | ICD-10-CM

## 2016-02-01 NOTE — Progress Notes (Signed)
Remote ICD transmission.   

## 2016-02-02 DIAGNOSIS — N183 Chronic kidney disease, stage 3 (moderate): Secondary | ICD-10-CM | POA: Diagnosis not present

## 2016-02-02 DIAGNOSIS — I110 Hypertensive heart disease with heart failure: Secondary | ICD-10-CM | POA: Diagnosis not present

## 2016-02-02 DIAGNOSIS — F411 Generalized anxiety disorder: Secondary | ICD-10-CM | POA: Diagnosis not present

## 2016-02-02 DIAGNOSIS — M545 Low back pain: Secondary | ICD-10-CM | POA: Diagnosis not present

## 2016-02-02 DIAGNOSIS — I481 Persistent atrial fibrillation: Secondary | ICD-10-CM | POA: Diagnosis not present

## 2016-02-02 DIAGNOSIS — E1143 Type 2 diabetes mellitus with diabetic autonomic (poly)neuropathy: Secondary | ICD-10-CM | POA: Diagnosis not present

## 2016-02-02 DIAGNOSIS — E782 Mixed hyperlipidemia: Secondary | ICD-10-CM | POA: Diagnosis not present

## 2016-02-02 DIAGNOSIS — Z95810 Presence of automatic (implantable) cardiac defibrillator: Secondary | ICD-10-CM | POA: Diagnosis not present

## 2016-02-02 DIAGNOSIS — I10 Essential (primary) hypertension: Secondary | ICD-10-CM | POA: Diagnosis not present

## 2016-02-28 DIAGNOSIS — N184 Chronic kidney disease, stage 4 (severe): Secondary | ICD-10-CM | POA: Diagnosis not present

## 2016-02-28 DIAGNOSIS — I10 Essential (primary) hypertension: Secondary | ICD-10-CM | POA: Diagnosis not present

## 2016-02-28 DIAGNOSIS — M25572 Pain in left ankle and joints of left foot: Secondary | ICD-10-CM | POA: Diagnosis not present

## 2016-02-28 DIAGNOSIS — F5105 Insomnia due to other mental disorder: Secondary | ICD-10-CM | POA: Diagnosis not present

## 2016-02-28 DIAGNOSIS — M545 Low back pain: Secondary | ICD-10-CM | POA: Diagnosis not present

## 2016-02-28 DIAGNOSIS — N183 Chronic kidney disease, stage 3 (moderate): Secondary | ICD-10-CM | POA: Diagnosis not present

## 2016-02-28 DIAGNOSIS — M25571 Pain in right ankle and joints of right foot: Secondary | ICD-10-CM | POA: Diagnosis not present

## 2016-03-09 LAB — CUP PACEART REMOTE DEVICE CHECK
Battery Remaining Longevity: 13 mo
Battery Voltage: 2.91 V
Brady Statistic AP VP Percent: 0 %
Brady Statistic AP VS Percent: 0 %
Brady Statistic AS VP Percent: 99.49 %
Brady Statistic AS VS Percent: 0.51 %
Brady Statistic RA Percent Paced: 0 %
Brady Statistic RV Percent Paced: 99.63 %
Date Time Interrogation Session: 20170425084222
HighPow Impedance: 48 Ohm
HighPow Impedance: 57 Ohm
Implantable Lead Implant Date: 20080908
Implantable Lead Implant Date: 20080908
Implantable Lead Implant Date: 20120207
Implantable Lead Location: 753858
Implantable Lead Location: 753859
Implantable Lead Location: 753860
Implantable Lead Model: 4194
Implantable Lead Model: 5076
Implantable Lead Model: 7121
Lead Channel Impedance Value: 304 Ohm
Lead Channel Impedance Value: 361 Ohm
Lead Channel Impedance Value: 456 Ohm
Lead Channel Impedance Value: 456 Ohm
Lead Channel Impedance Value: 589 Ohm
Lead Channel Impedance Value: 779 Ohm
Lead Channel Pacing Threshold Amplitude: 0.5 V
Lead Channel Pacing Threshold Amplitude: 2.75 V
Lead Channel Pacing Threshold Pulse Width: 0.4 ms
Lead Channel Pacing Threshold Pulse Width: 1 ms
Lead Channel Sensing Intrinsic Amplitude: 0.375 mV
Lead Channel Sensing Intrinsic Amplitude: 0.375 mV
Lead Channel Sensing Intrinsic Amplitude: 8.625 mV
Lead Channel Sensing Intrinsic Amplitude: 8.625 mV
Lead Channel Setting Pacing Amplitude: 2.5 V
Lead Channel Setting Pacing Amplitude: 3.75 V
Lead Channel Setting Pacing Pulse Width: 0.4 ms
Lead Channel Setting Pacing Pulse Width: 1 ms
Lead Channel Setting Sensing Sensitivity: 0.3 mV

## 2016-03-10 ENCOUNTER — Encounter: Payer: Self-pay | Admitting: Cardiology

## 2016-03-22 DIAGNOSIS — E1143 Type 2 diabetes mellitus with diabetic autonomic (poly)neuropathy: Secondary | ICD-10-CM | POA: Diagnosis not present

## 2016-03-22 DIAGNOSIS — M79672 Pain in left foot: Secondary | ICD-10-CM | POA: Diagnosis not present

## 2016-03-22 DIAGNOSIS — N183 Chronic kidney disease, stage 3 (moderate): Secondary | ICD-10-CM | POA: Diagnosis not present

## 2016-03-22 DIAGNOSIS — J301 Allergic rhinitis due to pollen: Secondary | ICD-10-CM | POA: Diagnosis not present

## 2016-03-22 DIAGNOSIS — M25572 Pain in left ankle and joints of left foot: Secondary | ICD-10-CM | POA: Diagnosis not present

## 2016-03-22 DIAGNOSIS — R2242 Localized swelling, mass and lump, left lower limb: Secondary | ICD-10-CM | POA: Diagnosis not present

## 2016-03-22 DIAGNOSIS — R0602 Shortness of breath: Secondary | ICD-10-CM | POA: Diagnosis not present

## 2016-03-30 DIAGNOSIS — E1142 Type 2 diabetes mellitus with diabetic polyneuropathy: Secondary | ICD-10-CM | POA: Diagnosis not present

## 2016-04-03 DIAGNOSIS — E1142 Type 2 diabetes mellitus with diabetic polyneuropathy: Secondary | ICD-10-CM | POA: Diagnosis not present

## 2016-04-27 DIAGNOSIS — M25551 Pain in right hip: Secondary | ICD-10-CM | POA: Diagnosis not present

## 2016-05-02 ENCOUNTER — Ambulatory Visit (INDEPENDENT_AMBULATORY_CARE_PROVIDER_SITE_OTHER): Payer: Commercial Managed Care - HMO | Admitting: *Deleted

## 2016-05-02 DIAGNOSIS — I4901 Ventricular fibrillation: Secondary | ICD-10-CM | POA: Diagnosis not present

## 2016-05-02 DIAGNOSIS — M10072 Idiopathic gout, left ankle and foot: Secondary | ICD-10-CM | POA: Diagnosis not present

## 2016-05-02 DIAGNOSIS — M545 Low back pain: Secondary | ICD-10-CM | POA: Diagnosis not present

## 2016-05-02 DIAGNOSIS — Z95810 Presence of automatic (implantable) cardiac defibrillator: Secondary | ICD-10-CM

## 2016-05-02 DIAGNOSIS — I10 Essential (primary) hypertension: Secondary | ICD-10-CM | POA: Diagnosis not present

## 2016-05-02 DIAGNOSIS — E782 Mixed hyperlipidemia: Secondary | ICD-10-CM | POA: Diagnosis not present

## 2016-05-02 DIAGNOSIS — I428 Other cardiomyopathies: Secondary | ICD-10-CM

## 2016-05-02 DIAGNOSIS — I481 Persistent atrial fibrillation: Secondary | ICD-10-CM | POA: Diagnosis not present

## 2016-05-02 DIAGNOSIS — I5022 Chronic systolic (congestive) heart failure: Secondary | ICD-10-CM

## 2016-05-02 DIAGNOSIS — I110 Hypertensive heart disease with heart failure: Secondary | ICD-10-CM | POA: Diagnosis not present

## 2016-05-02 DIAGNOSIS — N183 Chronic kidney disease, stage 3 (moderate): Secondary | ICD-10-CM | POA: Diagnosis not present

## 2016-05-02 DIAGNOSIS — E1143 Type 2 diabetes mellitus with diabetic autonomic (poly)neuropathy: Secondary | ICD-10-CM | POA: Diagnosis not present

## 2016-05-02 NOTE — Progress Notes (Signed)
Remote ICD transmission.   

## 2016-05-03 ENCOUNTER — Encounter: Payer: Self-pay | Admitting: Cardiology

## 2016-05-03 DIAGNOSIS — E1143 Type 2 diabetes mellitus with diabetic autonomic (poly)neuropathy: Secondary | ICD-10-CM | POA: Diagnosis not present

## 2016-05-03 DIAGNOSIS — I10 Essential (primary) hypertension: Secondary | ICD-10-CM | POA: Diagnosis not present

## 2016-05-04 LAB — CUP PACEART REMOTE DEVICE CHECK
Battery Remaining Longevity: 11 mo
Battery Voltage: 2.9 V
Brady Statistic AP VP Percent: 0 %
Brady Statistic AP VS Percent: 0 %
Brady Statistic AS VP Percent: 99.7 %
Brady Statistic AS VS Percent: 0.31 %
Brady Statistic RA Percent Paced: 0 %
Brady Statistic RV Percent Paced: 99.78 %
Date Time Interrogation Session: 20170725073522
HighPow Impedance: 47 Ohm
HighPow Impedance: 60 Ohm
Implantable Lead Implant Date: 20080908
Implantable Lead Implant Date: 20080908
Implantable Lead Implant Date: 20120207
Implantable Lead Location: 753858
Implantable Lead Location: 753859
Implantable Lead Location: 753860
Implantable Lead Model: 4194
Implantable Lead Model: 5076
Implantable Lead Model: 7121
Lead Channel Impedance Value: 342 Ohm
Lead Channel Impedance Value: 399 Ohm
Lead Channel Impedance Value: 456 Ohm
Lead Channel Impedance Value: 475 Ohm
Lead Channel Impedance Value: 551 Ohm
Lead Channel Impedance Value: 760 Ohm
Lead Channel Pacing Threshold Amplitude: 0.625 V
Lead Channel Pacing Threshold Amplitude: 3.25 V
Lead Channel Pacing Threshold Pulse Width: 0.4 ms
Lead Channel Pacing Threshold Pulse Width: 1 ms
Lead Channel Sensing Intrinsic Amplitude: 0.375 mV
Lead Channel Sensing Intrinsic Amplitude: 0.375 mV
Lead Channel Sensing Intrinsic Amplitude: 8.625 mV
Lead Channel Sensing Intrinsic Amplitude: 8.625 mV
Lead Channel Setting Pacing Amplitude: 2.5 V
Lead Channel Setting Pacing Amplitude: 3.75 V
Lead Channel Setting Pacing Pulse Width: 0.4 ms
Lead Channel Setting Pacing Pulse Width: 1 ms
Lead Channel Setting Sensing Sensitivity: 0.3 mV

## 2016-05-11 DIAGNOSIS — E875 Hyperkalemia: Secondary | ICD-10-CM | POA: Diagnosis not present

## 2016-05-25 DIAGNOSIS — N184 Chronic kidney disease, stage 4 (severe): Secondary | ICD-10-CM | POA: Diagnosis not present

## 2016-05-25 DIAGNOSIS — D509 Iron deficiency anemia, unspecified: Secondary | ICD-10-CM | POA: Diagnosis not present

## 2016-05-25 DIAGNOSIS — I701 Atherosclerosis of renal artery: Secondary | ICD-10-CM | POA: Diagnosis not present

## 2016-05-25 DIAGNOSIS — E875 Hyperkalemia: Secondary | ICD-10-CM | POA: Diagnosis not present

## 2016-05-25 DIAGNOSIS — N2581 Secondary hyperparathyroidism of renal origin: Secondary | ICD-10-CM | POA: Diagnosis not present

## 2016-05-25 DIAGNOSIS — E1143 Type 2 diabetes mellitus with diabetic autonomic (poly)neuropathy: Secondary | ICD-10-CM | POA: Diagnosis not present

## 2016-05-25 DIAGNOSIS — Z6829 Body mass index (BMI) 29.0-29.9, adult: Secondary | ICD-10-CM | POA: Diagnosis not present

## 2016-05-25 DIAGNOSIS — I129 Hypertensive chronic kidney disease with stage 1 through stage 4 chronic kidney disease, or unspecified chronic kidney disease: Secondary | ICD-10-CM | POA: Diagnosis not present

## 2016-05-25 DIAGNOSIS — I429 Cardiomyopathy, unspecified: Secondary | ICD-10-CM | POA: Diagnosis not present

## 2016-06-27 DIAGNOSIS — E119 Type 2 diabetes mellitus without complications: Secondary | ICD-10-CM | POA: Insufficient documentation

## 2016-06-27 DIAGNOSIS — M722 Plantar fascial fibromatosis: Secondary | ICD-10-CM | POA: Diagnosis not present

## 2016-06-27 DIAGNOSIS — M205X1 Other deformities of toe(s) (acquired), right foot: Secondary | ICD-10-CM

## 2016-06-27 HISTORY — DX: Other deformities of toe(s) (acquired), right foot: M20.5X1

## 2016-06-30 DIAGNOSIS — H524 Presbyopia: Secondary | ICD-10-CM | POA: Diagnosis not present

## 2016-06-30 DIAGNOSIS — H26493 Other secondary cataract, bilateral: Secondary | ICD-10-CM | POA: Diagnosis not present

## 2016-06-30 DIAGNOSIS — H04123 Dry eye syndrome of bilateral lacrimal glands: Secondary | ICD-10-CM | POA: Diagnosis not present

## 2016-07-10 DIAGNOSIS — Z0100 Encounter for examination of eyes and vision without abnormal findings: Secondary | ICD-10-CM | POA: Diagnosis not present

## 2016-07-12 DIAGNOSIS — Z23 Encounter for immunization: Secondary | ICD-10-CM | POA: Diagnosis not present

## 2016-08-07 ENCOUNTER — Encounter: Payer: Self-pay | Admitting: Internal Medicine

## 2016-08-07 ENCOUNTER — Ambulatory Visit (INDEPENDENT_AMBULATORY_CARE_PROVIDER_SITE_OTHER): Payer: Commercial Managed Care - HMO | Admitting: Internal Medicine

## 2016-08-07 VITALS — BP 140/62 | HR 95 | Ht 68.0 in | Wt 196.0 lb

## 2016-08-07 DIAGNOSIS — I482 Chronic atrial fibrillation, unspecified: Secondary | ICD-10-CM

## 2016-08-07 DIAGNOSIS — I5022 Chronic systolic (congestive) heart failure: Secondary | ICD-10-CM | POA: Diagnosis not present

## 2016-08-07 DIAGNOSIS — I4901 Ventricular fibrillation: Secondary | ICD-10-CM

## 2016-08-07 NOTE — Patient Instructions (Addendum)
Medication Instructions:    Your physician recommends that you continue on your current medications as directed. Please refer to the Current Medication list given to you today.  --- If you need a refill on your cardiac medications before your next appointment, please call your pharmacy. ---  Labwork:  None ordered  Testing/Procedures:  None ordered  Follow-Up: Remote monitoring is used to monitor your Pacemaker of ICD from home. This monitoring reduces the number of office visits required to check your device to one time per year. It allows [name] to keep an eye on the functioning of your device to ensure it is working properly. You are scheduled for a device check from home on 11/06/2016. You may send your transmission at any time that day. If you have a wireless device, the transmission will be sent automatically. After your physician reviews your transmission, you will receive a postcard with your next transmission date.   Your physician wants you to follow-up in: 1 year with Dr. Lovena Le reminder letter in the mail two months in advance. If you don't receive a letter, please call our office to schedule the follow-up appointment.  Thank you for choosing [facility]!!

## 2016-08-07 NOTE — Progress Notes (Signed)
HPI [name] returns today for ongoing evaluation and management of her ICD. She is a very pleasant 82 yo woman who I initially met approx. 10 years ago when she was referred for BiV PM insertion and AV node ablation which were successfully performed. She has now had chronic atrial fibrillation. Her heart failure symptoms are class 2. She has not had any ICD shocks. She notes mild peripheral edema. She has not had syncope.  In addition, the patient had a history of polymorphic ventricular tachycardia. Since her device was placed she has been stable. She has been under increased stress with family illnesses. Allergies  Allergen Reactions  . Lyrica [Pregabalin]     Swelling   . Neurontin [Gabapentin]     Swelling   . Morphine Nausea Only  . Procaine Other (See Comments)    unknown  . Lisinopril     Rash   . Penicillins     Rash   . Tape Rash    TAPE     Current Outpatient Prescriptions  Medication Sig Dispense Refill  . ALPRAZolam (XANAX) 0.5 MG tablet Take 1 tablet by mouth at bedtime.    . digoxin (LANOXIN) 0.125 MG tablet Take 0.125 mg by mouth daily.     Marland Kitchen ezetimibe (ZETIA) 10 MG tablet Take 10 mg by mouth daily.    . fenofibrate micronized (LOFIBRA) 134 MG capsule Take 1 capsule by mouth daily.    . fluticasone (FLONASE) 50 MCG/ACT nasal spray Place 2 sprays into both nostrils daily as needed for allergies or rhinitis.     . furosemide (LASIX) 40 MG tablet Take 40 mg by mouth daily. May take one extra lasix by mouth daily as needed for swelling    . glipiZIDE (GLUCOTROL) 5 MG tablet Take 5 mg by mouth 2 (two) times daily before a meal.    . Homeopathic Products ([employer]S LEG CRAMPS PAIN RELIEF) TABS Take 1 tablet by mouth daily as needed (for leg cramps).     . Insulin Detemir (LEVEMIR FLEXPEN) 100 UNIT/ML Pen Inject 30 Units into the skin at bedtime.    Marland Kitchen levothyroxine (SYNTHROID, LEVOTHROID) 50 MCG tablet Take 50 mcg by mouth daily.    Marland Kitchen losartan (COZAAR) 100 MG  tablet Take 1 tablet by mouth daily.    . magnesium oxide (MAG-OX) 400 MG tablet Take 400 mg by mouth 2 (two) times daily.    . metoprolol succinate (TOPROL-XL) 100 MG 24 hr tablet Take 100 mg by mouth 2 (two) times daily. Take with or immediately following a meal.    . Multiple Vitamins-Minerals (CENTRUM SILVER PO) Take 1 capsule by mouth daily.    Marland Kitchen omega-3 acid ethyl esters (LOVAZA) 1 G capsule Take 2 g by mouth 2 (two) times daily.     Marland Kitchen oxyCODONE-acetaminophen (PERCOCET) 7.5-325 MG per tablet Take 1 tablet by mouth every 4 (four) hours as needed for pain.    . pantoprazole (PROTONIX) 40 MG tablet Take 40 mg by mouth 2 (two) times daily.    . potassium chloride SA (K-DUR,KLOR-CON) 20 MEQ tablet Take 2 tablets by mouth daily.    . pravastatin (PRAVACHOL) 20 MG tablet Take 20 mg by mouth at bedtime.    . Rivaroxaban (XARELTO) 15 MG TABS tablet Take 1 tablet (15 mg total) by mouth daily with supper. 30 tablet 11  . rOPINIRole (REQUIP) 1 MG tablet Take 1 tablet by mouth daily.     No current facility-administered medications for this  visit.      Past Medical History:  Diagnosis Date  . Amiodarone pulmonary toxicity   . Anemia    no GI bleeding; on iron  . Biventricular ICD (implantable cardiac defibrillator) in place    BiV ICD for nonischemic cardiomyopathy; BiV responder  . Chronic a-fib ([place])    on xarelto; failed DCCV  . Diabetes ([place])   . H/O cardiovascular stress test 08/04/2010   normal imaging; EF 61%  . H/O echocardiogram 07/05/2010   EF 45-50%; aortic valve-mildly sclerotic; LA-mod dilaterd   . Hyperlipemia   . 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 LBBB (left bundle branch block)   . PVD (peripheral vascular disease) ([place])    L RA stent 1994  . RAS (renal artery stenosis) ([place])    a. renal dopp (12/18/08)- Right RA-<60%; L RA stent- open; nl size and shape in both kidneys;  b.  Rena Artery [name] (3/16):  SMA with > 70% stenosis, Bilateral prox RA with  1-59%  . Renal insufficiency, mild    05/01/12 Creat 1.47  . VT (ventricular tachycardia) ([place]) 9/11   polymorphic VT probably related to sotalol therapy    ROS:   All systems reviewed and negative except as noted in the HPI.   Past Surgical History:  Procedure Laterality Date  . AV NODE ABLATION  06/08/2006   performed by Dr. Beckie Salts; due to Afib with RVR and tachy brady syndrome  . BIV ICD GENERTAOR CHANGE OUT  11/15/10   Medtronic Protecta [id number] serial #[id number]  . BiV ICD Placement  06/17/2007; 04/20/14   Medtronic Concerta [id number]; RA lead-Med 5076/53cm, [id number]; RV lead- SJM 7121/65cm, [id number]; LV lead- Med 4194/88cm, [id number]; gen change 04/2014 by Dr Lovena Le  . BIV PACEMAKER GENERATOR CHANGE OUT N/A 04/20/2014   Procedure: BIV PACEMAKER GENERATOR CHANGE OUT;  Surgeon: Evans Lance, MD;  Location: [facility] CATH LAB;  Service: Cardiovascular;  Laterality: N/A;  . BRAIN MENINGIOMA EXCISION  2011   L frontal meningioma at [facility]   . CARDIOVERSION  07/21/05   converted to sinus brady  . CHOLECYSTECTOMY     Dr. Sherald Hess in [place] and Dr. Lyda Jester follows; surgical stricture post procedure with stent placement  . PV Angio  1994   L Renal artery stent      Family History  Problem Relation Age of Onset  . Dementia Sister   . Heart attack Brother   . Stroke Maternal Grandmother   . Heart failure Mother      Social History   Social History  . Marital status: Married    Spouse name: N/A  . Number of children: N/A  . Years of education: N/A   Occupational History  . Not on file.   Social History Main Topics  . Smoking status: Never Smoker  . Smokeless tobacco: Not on file  . Alcohol use No  . Drug use: No  . Sexual activity: Not on file   Other Topics Concern  . Not on file   Social History Narrative  . No narrative on file     BP 140/62   Pulse 95   Ht _0  (1.727 m)   Wt 196 lb (88.9 kg)   BMI 29.80 kg/m   Physical Exam:  Well  appearing 82 yo woman, NAD HEENT: Unremarkable Neck:  6 cm JVD, no thyromegally Back:  No CVA tenderness Lungs:  Clear with no wheezes, rales, or rhonchi, well-healed ICD incision. HEART:  Regular  rate rhythm, no murmurs, no rubs, no clicks Abd:  soft, positive bowel sounds, no organomegally, no rebound, no guarding Ext:  2 plus pulses, no edema, no cyanosis, no clubbing Skin:  No rashes no nodules Neuro:  CN II through XII intact, motor grossly intact  EKG - atrial fib with bi-ventricular pacing  DEVICE  Normal device function.  See PaceArt for details.   Assess/Plan: 1. Chronic atrial fib - her ventricular rate is controlled. She will continue her blood thinner 2. Chronic systolic heart failure - her EF has nearly normalized. She will continue her current meds. Her symptoms are class 2. 3. ICD - she is approaching ERI. When she reaches ERI, will decide on whether to change out the device or place a new lead, perhaps in the His bundle region and abandon the LV lead.  Mikle Bosworth.D.

## 2016-08-08 DIAGNOSIS — M545 Low back pain: Secondary | ICD-10-CM | POA: Diagnosis not present

## 2016-08-08 DIAGNOSIS — I10 Essential (primary) hypertension: Secondary | ICD-10-CM | POA: Diagnosis not present

## 2016-08-08 DIAGNOSIS — E782 Mixed hyperlipidemia: Secondary | ICD-10-CM | POA: Diagnosis not present

## 2016-08-08 DIAGNOSIS — Z95810 Presence of automatic (implantable) cardiac defibrillator: Secondary | ICD-10-CM | POA: Diagnosis not present

## 2016-08-08 DIAGNOSIS — E1143 Type 2 diabetes mellitus with diabetic autonomic (poly)neuropathy: Secondary | ICD-10-CM | POA: Diagnosis not present

## 2016-08-08 DIAGNOSIS — I110 Hypertensive heart disease with heart failure: Secondary | ICD-10-CM | POA: Diagnosis not present

## 2016-08-08 DIAGNOSIS — N183 Chronic kidney disease, stage 3 (moderate): Secondary | ICD-10-CM | POA: Diagnosis not present

## 2016-08-08 DIAGNOSIS — Z1239 Encounter for other screening for malignant neoplasm of breast: Secondary | ICD-10-CM | POA: Diagnosis not present

## 2016-08-08 DIAGNOSIS — I481 Persistent atrial fibrillation: Secondary | ICD-10-CM | POA: Diagnosis not present

## 2016-08-17 DIAGNOSIS — M542 Cervicalgia: Secondary | ICD-10-CM | POA: Diagnosis not present

## 2016-08-18 LAB — CUP PACEART INCLINIC DEVICE CHECK
Battery Remaining Longevity: 10 mo
Battery Voltage: 2.88 V
Brady Statistic AP VP Percent: 0 %
Brady Statistic AP VS Percent: 0 %
Brady Statistic AS VP Percent: 99.33 %
Brady Statistic AS VS Percent: 0.67 %
Brady Statistic RA Percent Paced: 0 %
Brady Statistic RV Percent Paced: 99.47 %
Date Time Interrogation Session: 20171030142508
HighPow Impedance: 47 Ohm
HighPow Impedance: 63 Ohm
Implantable Lead Implant Date: 20080908
Implantable Lead Implant Date: 20080908
Implantable Lead Implant Date: 20120207
Implantable Lead Location: 753858
Implantable Lead Location: 753859
Implantable Lead Location: 753860
Implantable Lead Model: 4194
Implantable Lead Model: 5076
Implantable Lead Model: 7121
Implantable Pulse Generator Implant Date: 20150713
Lead Channel Impedance Value: 342 Ohm
Lead Channel Impedance Value: 399 Ohm
Lead Channel Impedance Value: 475 Ohm
Lead Channel Impedance Value: 532 Ohm
Lead Channel Impedance Value: 589 Ohm
Lead Channel Impedance Value: 836 Ohm
Lead Channel Pacing Threshold Amplitude: 0.75 V
Lead Channel Pacing Threshold Amplitude: 3 V
Lead Channel Pacing Threshold Pulse Width: 0.4 ms
Lead Channel Pacing Threshold Pulse Width: 1 ms
Lead Channel Setting Pacing Amplitude: 2.5 V
Lead Channel Setting Pacing Amplitude: 3.75 V
Lead Channel Setting Pacing Pulse Width: 0.4 ms
Lead Channel Setting Pacing Pulse Width: 1 ms
Lead Channel Setting Sensing Sensitivity: 0.3 mV

## 2016-08-23 DIAGNOSIS — I701 Atherosclerosis of renal artery: Secondary | ICD-10-CM | POA: Diagnosis not present

## 2016-08-23 DIAGNOSIS — E875 Hyperkalemia: Secondary | ICD-10-CM | POA: Diagnosis not present

## 2016-08-23 DIAGNOSIS — I129 Hypertensive chronic kidney disease with stage 1 through stage 4 chronic kidney disease, or unspecified chronic kidney disease: Secondary | ICD-10-CM | POA: Diagnosis not present

## 2016-08-23 DIAGNOSIS — I429 Cardiomyopathy, unspecified: Secondary | ICD-10-CM | POA: Diagnosis not present

## 2016-08-23 DIAGNOSIS — N2581 Secondary hyperparathyroidism of renal origin: Secondary | ICD-10-CM | POA: Diagnosis not present

## 2016-08-23 DIAGNOSIS — E1143 Type 2 diabetes mellitus with diabetic autonomic (poly)neuropathy: Secondary | ICD-10-CM | POA: Diagnosis not present

## 2016-08-23 DIAGNOSIS — D509 Iron deficiency anemia, unspecified: Secondary | ICD-10-CM | POA: Diagnosis not present

## 2016-08-23 DIAGNOSIS — Z6829 Body mass index (BMI) 29.0-29.9, adult: Secondary | ICD-10-CM | POA: Diagnosis not present

## 2016-08-23 DIAGNOSIS — N184 Chronic kidney disease, stage 4 (severe): Secondary | ICD-10-CM | POA: Diagnosis not present

## 2016-09-11 DIAGNOSIS — Z0000 Encounter for general adult medical examination without abnormal findings: Secondary | ICD-10-CM | POA: Diagnosis not present

## 2016-09-11 DIAGNOSIS — E6609 Other obesity due to excess calories: Secondary | ICD-10-CM | POA: Diagnosis not present

## 2016-09-11 DIAGNOSIS — Z6830 Body mass index (BMI) 30.0-30.9, adult: Secondary | ICD-10-CM | POA: Diagnosis not present

## 2016-09-11 DIAGNOSIS — Z23 Encounter for immunization: Secondary | ICD-10-CM | POA: Diagnosis not present

## 2016-09-11 DIAGNOSIS — Z1239 Encounter for other screening for malignant neoplasm of breast: Secondary | ICD-10-CM | POA: Diagnosis not present

## 2016-09-12 DIAGNOSIS — M5412 Radiculopathy, cervical region: Secondary | ICD-10-CM | POA: Diagnosis not present

## 2016-09-12 DIAGNOSIS — M542 Cervicalgia: Secondary | ICD-10-CM | POA: Diagnosis not present

## 2016-09-15 DIAGNOSIS — M85852 Other specified disorders of bone density and structure, left thigh: Secondary | ICD-10-CM | POA: Diagnosis not present

## 2016-09-15 DIAGNOSIS — N959 Unspecified menopausal and perimenopausal disorder: Secondary | ICD-10-CM | POA: Diagnosis not present

## 2016-09-19 DIAGNOSIS — Z1211 Encounter for screening for malignant neoplasm of colon: Secondary | ICD-10-CM | POA: Diagnosis not present

## 2016-10-05 DIAGNOSIS — Z7901 Long term (current) use of anticoagulants: Secondary | ICD-10-CM | POA: Diagnosis not present

## 2016-10-05 DIAGNOSIS — M5021 Other cervical disc displacement,  high cervical region: Secondary | ICD-10-CM | POA: Diagnosis not present

## 2016-10-05 DIAGNOSIS — M50222 Other cervical disc displacement at C5-C6 level: Secondary | ICD-10-CM | POA: Diagnosis not present

## 2016-10-05 DIAGNOSIS — M5412 Radiculopathy, cervical region: Secondary | ICD-10-CM | POA: Diagnosis not present

## 2016-10-05 DIAGNOSIS — Z95810 Presence of automatic (implantable) cardiac defibrillator: Secondary | ICD-10-CM | POA: Diagnosis not present

## 2016-10-10 DIAGNOSIS — M542 Cervicalgia: Secondary | ICD-10-CM | POA: Diagnosis not present

## 2016-10-17 DIAGNOSIS — M5412 Radiculopathy, cervical region: Secondary | ICD-10-CM | POA: Diagnosis not present

## 2016-10-17 DIAGNOSIS — M542 Cervicalgia: Secondary | ICD-10-CM | POA: Diagnosis not present

## 2016-10-17 DIAGNOSIS — M2560 Stiffness of unspecified joint, not elsewhere classified: Secondary | ICD-10-CM | POA: Diagnosis not present

## 2016-10-23 DIAGNOSIS — M2560 Stiffness of unspecified joint, not elsewhere classified: Secondary | ICD-10-CM | POA: Diagnosis not present

## 2016-10-23 DIAGNOSIS — M542 Cervicalgia: Secondary | ICD-10-CM | POA: Diagnosis not present

## 2016-10-23 DIAGNOSIS — M5412 Radiculopathy, cervical region: Secondary | ICD-10-CM | POA: Diagnosis not present

## 2016-10-30 DIAGNOSIS — M5412 Radiculopathy, cervical region: Secondary | ICD-10-CM | POA: Diagnosis not present

## 2016-10-30 DIAGNOSIS — M542 Cervicalgia: Secondary | ICD-10-CM | POA: Diagnosis not present

## 2016-10-30 DIAGNOSIS — M2560 Stiffness of unspecified joint, not elsewhere classified: Secondary | ICD-10-CM | POA: Diagnosis not present

## 2016-11-06 ENCOUNTER — Ambulatory Visit (INDEPENDENT_AMBULATORY_CARE_PROVIDER_SITE_OTHER): Payer: Medicare HMO | Admitting: *Deleted

## 2016-11-06 DIAGNOSIS — I428 Other cardiomyopathies: Secondary | ICD-10-CM | POA: Diagnosis not present

## 2016-11-06 NOTE — Progress Notes (Signed)
Remote ICD transmission.   

## 2016-11-07 ENCOUNTER — Encounter: Payer: Self-pay | Admitting: Cardiology

## 2016-11-07 DIAGNOSIS — M2560 Stiffness of unspecified joint, not elsewhere classified: Secondary | ICD-10-CM | POA: Diagnosis not present

## 2016-11-07 DIAGNOSIS — M5412 Radiculopathy, cervical region: Secondary | ICD-10-CM | POA: Diagnosis not present

## 2016-11-07 DIAGNOSIS — M542 Cervicalgia: Secondary | ICD-10-CM | POA: Diagnosis not present

## 2016-11-09 DIAGNOSIS — M5412 Radiculopathy, cervical region: Secondary | ICD-10-CM | POA: Diagnosis not present

## 2016-11-09 DIAGNOSIS — M542 Cervicalgia: Secondary | ICD-10-CM | POA: Diagnosis not present

## 2016-11-09 DIAGNOSIS — M2560 Stiffness of unspecified joint, not elsewhere classified: Secondary | ICD-10-CM | POA: Diagnosis not present

## 2016-11-09 LAB — CUP PACEART REMOTE DEVICE CHECK
Battery Remaining Longevity: 12 mo
Battery Voltage: 2.89 V
Brady Statistic AP VP Percent: 0 %
Brady Statistic AP VS Percent: 0 %
Brady Statistic AS VP Percent: 100 %
Brady Statistic AS VS Percent: 0 %
Brady Statistic RA Percent Paced: 0 %
Brady Statistic RV Percent Paced: 99.67 %
Date Time Interrogation Session: 20180129062504
HighPow Impedance: 50 Ohm
HighPow Impedance: 71 Ohm
Implantable Lead Implant Date: 20080908
Implantable Lead Implant Date: 20080908
Implantable Lead Implant Date: 20120207
Implantable Lead Location: 753858
Implantable Lead Location: 753859
Implantable Lead Location: 753860
Implantable Lead Model: 4194
Implantable Lead Model: 5076
Implantable Lead Model: 7121
Implantable Pulse Generator Implant Date: 20150713
Lead Channel Impedance Value: 342 Ohm
Lead Channel Impedance Value: 342 Ohm
Lead Channel Impedance Value: 475 Ohm
Lead Channel Impedance Value: 513 Ohm
Lead Channel Impedance Value: 532 Ohm
Lead Channel Impedance Value: 722 Ohm
Lead Channel Pacing Threshold Amplitude: 0.75 V
Lead Channel Pacing Threshold Amplitude: 3.25 V
Lead Channel Pacing Threshold Pulse Width: 0.4 ms
Lead Channel Pacing Threshold Pulse Width: 1 ms
Lead Channel Sensing Intrinsic Amplitude: 0.375 mV
Lead Channel Sensing Intrinsic Amplitude: 0.875 mV
Lead Channel Sensing Intrinsic Amplitude: 8.875 mV
Lead Channel Sensing Intrinsic Amplitude: 8.875 mV
Lead Channel Setting Pacing Amplitude: 2.5 V
Lead Channel Setting Pacing Amplitude: 3.75 V
Lead Channel Setting Pacing Pulse Width: 0.4 ms
Lead Channel Setting Pacing Pulse Width: 1 ms
Lead Channel Setting Sensing Sensitivity: 0.3 mV

## 2016-11-13 DIAGNOSIS — I110 Hypertensive heart disease with heart failure: Secondary | ICD-10-CM | POA: Diagnosis not present

## 2016-11-13 DIAGNOSIS — N183 Chronic kidney disease, stage 3 (moderate): Secondary | ICD-10-CM | POA: Diagnosis not present

## 2016-11-13 DIAGNOSIS — E1143 Type 2 diabetes mellitus with diabetic autonomic (poly)neuropathy: Secondary | ICD-10-CM | POA: Diagnosis not present

## 2016-11-13 DIAGNOSIS — E119 Type 2 diabetes mellitus without complications: Secondary | ICD-10-CM | POA: Diagnosis not present

## 2016-11-13 DIAGNOSIS — E782 Mixed hyperlipidemia: Secondary | ICD-10-CM | POA: Diagnosis not present

## 2016-11-13 DIAGNOSIS — Z95810 Presence of automatic (implantable) cardiac defibrillator: Secondary | ICD-10-CM | POA: Diagnosis not present

## 2016-11-13 DIAGNOSIS — I481 Persistent atrial fibrillation: Secondary | ICD-10-CM | POA: Diagnosis not present

## 2016-11-13 DIAGNOSIS — I10 Essential (primary) hypertension: Secondary | ICD-10-CM | POA: Diagnosis not present

## 2016-11-13 DIAGNOSIS — E038 Other specified hypothyroidism: Secondary | ICD-10-CM | POA: Diagnosis not present

## 2016-11-13 DIAGNOSIS — M545 Low back pain: Secondary | ICD-10-CM | POA: Diagnosis not present

## 2016-11-16 DIAGNOSIS — M5412 Radiculopathy, cervical region: Secondary | ICD-10-CM | POA: Diagnosis not present

## 2016-11-16 DIAGNOSIS — M542 Cervicalgia: Secondary | ICD-10-CM | POA: Diagnosis not present

## 2016-11-16 DIAGNOSIS — M2560 Stiffness of unspecified joint, not elsewhere classified: Secondary | ICD-10-CM | POA: Diagnosis not present

## 2016-12-04 DIAGNOSIS — N2581 Secondary hyperparathyroidism of renal origin: Secondary | ICD-10-CM | POA: Diagnosis not present

## 2016-12-04 DIAGNOSIS — E1143 Type 2 diabetes mellitus with diabetic autonomic (poly)neuropathy: Secondary | ICD-10-CM | POA: Diagnosis not present

## 2016-12-04 DIAGNOSIS — D509 Iron deficiency anemia, unspecified: Secondary | ICD-10-CM | POA: Diagnosis not present

## 2016-12-04 DIAGNOSIS — N184 Chronic kidney disease, stage 4 (severe): Secondary | ICD-10-CM | POA: Diagnosis not present

## 2016-12-04 DIAGNOSIS — I420 Dilated cardiomyopathy: Secondary | ICD-10-CM | POA: Diagnosis not present

## 2016-12-04 DIAGNOSIS — I701 Atherosclerosis of renal artery: Secondary | ICD-10-CM | POA: Diagnosis not present

## 2016-12-04 DIAGNOSIS — Z6829 Body mass index (BMI) 29.0-29.9, adult: Secondary | ICD-10-CM | POA: Diagnosis not present

## 2016-12-04 DIAGNOSIS — I129 Hypertensive chronic kidney disease with stage 1 through stage 4 chronic kidney disease, or unspecified chronic kidney disease: Secondary | ICD-10-CM | POA: Diagnosis not present

## 2016-12-04 DIAGNOSIS — E875 Hyperkalemia: Secondary | ICD-10-CM | POA: Diagnosis not present

## 2017-02-05 ENCOUNTER — Ambulatory Visit (INDEPENDENT_AMBULATORY_CARE_PROVIDER_SITE_OTHER): Payer: Medicare HMO | Admitting: *Deleted

## 2017-02-05 DIAGNOSIS — I428 Other cardiomyopathies: Secondary | ICD-10-CM | POA: Diagnosis not present

## 2017-02-05 DIAGNOSIS — I5022 Chronic systolic (congestive) heart failure: Secondary | ICD-10-CM

## 2017-02-05 NOTE — Progress Notes (Signed)
Remote ICD transmission.   

## 2017-02-06 ENCOUNTER — Encounter: Payer: Self-pay | Admitting: Cardiology

## 2017-02-06 LAB — CUP PACEART REMOTE DEVICE CHECK
Battery Remaining Longevity: 11 mo
Battery Voltage: 2.87 V
Brady Statistic AP VP Percent: 0 %
Brady Statistic AP VS Percent: 0 %
Brady Statistic AS VP Percent: 100 %
Brady Statistic AS VS Percent: 0 %
Brady Statistic RA Percent Paced: 0 %
Brady Statistic RV Percent Paced: 99.75 %
Date Time Interrogation Session: 20180430062702
HighPow Impedance: 51 Ohm
HighPow Impedance: 72 Ohm
Implantable Lead Implant Date: 20080908
Implantable Lead Implant Date: 20080908
Implantable Lead Implant Date: 20120207
Implantable Lead Location: 753858
Implantable Lead Location: 753859
Implantable Lead Location: 753860
Implantable Lead Model: 4194
Implantable Lead Model: 5076
Implantable Lead Model: 7121
Implantable Pulse Generator Implant Date: 20150713
Lead Channel Impedance Value: 342 Ohm
Lead Channel Impedance Value: 361 Ohm
Lead Channel Impedance Value: 475 Ohm
Lead Channel Impedance Value: 475 Ohm
Lead Channel Impedance Value: 551 Ohm
Lead Channel Impedance Value: 779 Ohm
Lead Channel Pacing Threshold Amplitude: 0.625 V
Lead Channel Pacing Threshold Amplitude: 3.25 V
Lead Channel Pacing Threshold Pulse Width: 0.4 ms
Lead Channel Pacing Threshold Pulse Width: 1 ms
Lead Channel Sensing Intrinsic Amplitude: 0.375 mV
Lead Channel Sensing Intrinsic Amplitude: 0.875 mV
Lead Channel Sensing Intrinsic Amplitude: 7.5 mV
Lead Channel Sensing Intrinsic Amplitude: 7.5 mV
Lead Channel Setting Pacing Amplitude: 2.5 V
Lead Channel Setting Pacing Amplitude: 3.75 V
Lead Channel Setting Pacing Pulse Width: 0.4 ms
Lead Channel Setting Pacing Pulse Width: 1 ms
Lead Channel Setting Sensing Sensitivity: 0.3 mV

## 2017-02-22 DIAGNOSIS — E663 Overweight: Secondary | ICD-10-CM | POA: Diagnosis not present

## 2017-02-22 DIAGNOSIS — E1142 Type 2 diabetes mellitus with diabetic polyneuropathy: Secondary | ICD-10-CM | POA: Diagnosis not present

## 2017-02-22 DIAGNOSIS — Z6830 Body mass index (BMI) 30.0-30.9, adult: Secondary | ICD-10-CM | POA: Diagnosis not present

## 2017-02-22 DIAGNOSIS — E1143 Type 2 diabetes mellitus with diabetic autonomic (poly)neuropathy: Secondary | ICD-10-CM | POA: Diagnosis not present

## 2017-02-22 DIAGNOSIS — I481 Persistent atrial fibrillation: Secondary | ICD-10-CM | POA: Diagnosis not present

## 2017-02-22 DIAGNOSIS — E782 Mixed hyperlipidemia: Secondary | ICD-10-CM | POA: Diagnosis not present

## 2017-02-22 DIAGNOSIS — N184 Chronic kidney disease, stage 4 (severe): Secondary | ICD-10-CM | POA: Diagnosis not present

## 2017-02-22 DIAGNOSIS — I110 Hypertensive heart disease with heart failure: Secondary | ICD-10-CM | POA: Diagnosis not present

## 2017-02-22 DIAGNOSIS — E038 Other specified hypothyroidism: Secondary | ICD-10-CM | POA: Diagnosis not present

## 2017-03-08 DIAGNOSIS — M205X1 Other deformities of toe(s) (acquired), right foot: Secondary | ICD-10-CM | POA: Diagnosis not present

## 2017-04-03 DIAGNOSIS — M205X1 Other deformities of toe(s) (acquired), right foot: Secondary | ICD-10-CM | POA: Diagnosis not present

## 2017-04-03 DIAGNOSIS — M76822 Posterior tibial tendinitis, left leg: Secondary | ICD-10-CM | POA: Diagnosis not present

## 2017-04-03 DIAGNOSIS — E119 Type 2 diabetes mellitus without complications: Secondary | ICD-10-CM | POA: Diagnosis not present

## 2017-05-07 ENCOUNTER — Ambulatory Visit (INDEPENDENT_AMBULATORY_CARE_PROVIDER_SITE_OTHER): Payer: Medicare HMO | Admitting: *Deleted

## 2017-05-07 DIAGNOSIS — I428 Other cardiomyopathies: Secondary | ICD-10-CM

## 2017-05-07 DIAGNOSIS — I5022 Chronic systolic (congestive) heart failure: Secondary | ICD-10-CM

## 2017-05-07 NOTE — Progress Notes (Signed)
Remote ICD transmission.   

## 2017-05-10 LAB — CUP PACEART REMOTE DEVICE CHECK
Battery Remaining Longevity: 8 mo
Battery Voltage: 2.85 V
Brady Statistic AP VP Percent: 0 %
Brady Statistic AP VS Percent: 0 %
Brady Statistic AS VP Percent: 0 %
Brady Statistic AS VS Percent: 0 %
Brady Statistic RA Percent Paced: 0 %
Brady Statistic RV Percent Paced: 99.59 %
Date Time Interrogation Session: 20180730083723
HighPow Impedance: 47 Ohm
HighPow Impedance: 57 Ohm
Implantable Lead Implant Date: 20080908
Implantable Lead Implant Date: 20080908
Implantable Lead Implant Date: 20120207
Implantable Lead Location: 753858
Implantable Lead Location: 753859
Implantable Lead Location: 753860
Implantable Lead Model: 4194
Implantable Lead Model: 5076
Implantable Lead Model: 7121
Implantable Pulse Generator Implant Date: 20150713
Lead Channel Impedance Value: 342 Ohm
Lead Channel Impedance Value: 342 Ohm
Lead Channel Impedance Value: 475 Ohm
Lead Channel Impedance Value: 475 Ohm
Lead Channel Impedance Value: 532 Ohm
Lead Channel Impedance Value: 722 Ohm
Lead Channel Pacing Threshold Amplitude: 0.75 V
Lead Channel Pacing Threshold Amplitude: 3.5 V
Lead Channel Pacing Threshold Pulse Width: 0.4 ms
Lead Channel Pacing Threshold Pulse Width: 1 ms
Lead Channel Sensing Intrinsic Amplitude: 0.375 mV
Lead Channel Sensing Intrinsic Amplitude: 0.875 mV
Lead Channel Sensing Intrinsic Amplitude: 7.5 mV
Lead Channel Sensing Intrinsic Amplitude: 7.5 mV
Lead Channel Setting Pacing Amplitude: 2.5 V
Lead Channel Setting Pacing Amplitude: 4 V
Lead Channel Setting Pacing Pulse Width: 0.4 ms
Lead Channel Setting Pacing Pulse Width: 1 ms
Lead Channel Setting Sensing Sensitivity: 0.3 mV

## 2017-05-11 ENCOUNTER — Encounter: Payer: Self-pay | Admitting: Cardiology

## 2017-05-28 DIAGNOSIS — N184 Chronic kidney disease, stage 4 (severe): Secondary | ICD-10-CM | POA: Diagnosis not present

## 2017-05-28 DIAGNOSIS — I10 Essential (primary) hypertension: Secondary | ICD-10-CM | POA: Diagnosis not present

## 2017-05-28 DIAGNOSIS — E1143 Type 2 diabetes mellitus with diabetic autonomic (poly)neuropathy: Secondary | ICD-10-CM | POA: Diagnosis not present

## 2017-05-28 DIAGNOSIS — I481 Persistent atrial fibrillation: Secondary | ICD-10-CM | POA: Diagnosis not present

## 2017-05-28 DIAGNOSIS — E782 Mixed hyperlipidemia: Secondary | ICD-10-CM | POA: Diagnosis not present

## 2017-05-28 DIAGNOSIS — E038 Other specified hypothyroidism: Secondary | ICD-10-CM | POA: Diagnosis not present

## 2017-05-28 DIAGNOSIS — E663 Overweight: Secondary | ICD-10-CM | POA: Diagnosis not present

## 2017-05-28 DIAGNOSIS — Z6830 Body mass index (BMI) 30.0-30.9, adult: Secondary | ICD-10-CM | POA: Diagnosis not present

## 2017-05-28 DIAGNOSIS — E1121 Type 2 diabetes mellitus with diabetic nephropathy: Secondary | ICD-10-CM | POA: Diagnosis not present

## 2017-05-28 DIAGNOSIS — I110 Hypertensive heart disease with heart failure: Secondary | ICD-10-CM | POA: Diagnosis not present

## 2017-06-05 DIAGNOSIS — N184 Chronic kidney disease, stage 4 (severe): Secondary | ICD-10-CM | POA: Diagnosis not present

## 2017-06-05 DIAGNOSIS — E1143 Type 2 diabetes mellitus with diabetic autonomic (poly)neuropathy: Secondary | ICD-10-CM | POA: Diagnosis not present

## 2017-06-05 DIAGNOSIS — E875 Hyperkalemia: Secondary | ICD-10-CM | POA: Diagnosis not present

## 2017-06-05 DIAGNOSIS — I129 Hypertensive chronic kidney disease with stage 1 through stage 4 chronic kidney disease, or unspecified chronic kidney disease: Secondary | ICD-10-CM | POA: Diagnosis not present

## 2017-06-05 DIAGNOSIS — N2581 Secondary hyperparathyroidism of renal origin: Secondary | ICD-10-CM | POA: Diagnosis not present

## 2017-06-05 DIAGNOSIS — Z6829 Body mass index (BMI) 29.0-29.9, adult: Secondary | ICD-10-CM | POA: Diagnosis not present

## 2017-06-05 DIAGNOSIS — I701 Atherosclerosis of renal artery: Secondary | ICD-10-CM | POA: Diagnosis not present

## 2017-06-05 DIAGNOSIS — I420 Dilated cardiomyopathy: Secondary | ICD-10-CM | POA: Diagnosis not present

## 2017-06-05 DIAGNOSIS — D509 Iron deficiency anemia, unspecified: Secondary | ICD-10-CM | POA: Diagnosis not present

## 2017-06-26 DIAGNOSIS — M76822 Posterior tibial tendinitis, left leg: Secondary | ICD-10-CM | POA: Diagnosis not present

## 2017-06-26 DIAGNOSIS — E119 Type 2 diabetes mellitus without complications: Secondary | ICD-10-CM | POA: Diagnosis not present

## 2017-06-26 DIAGNOSIS — M205X1 Other deformities of toe(s) (acquired), right foot: Secondary | ICD-10-CM | POA: Diagnosis not present

## 2017-06-27 DIAGNOSIS — Z794 Long term (current) use of insulin: Secondary | ICD-10-CM | POA: Diagnosis not present

## 2017-06-27 DIAGNOSIS — H26493 Other secondary cataract, bilateral: Secondary | ICD-10-CM | POA: Diagnosis not present

## 2017-06-27 DIAGNOSIS — E119 Type 2 diabetes mellitus without complications: Secondary | ICD-10-CM | POA: Diagnosis not present

## 2017-07-11 DIAGNOSIS — Z23 Encounter for immunization: Secondary | ICD-10-CM | POA: Diagnosis not present

## 2017-08-06 ENCOUNTER — Ambulatory Visit (INDEPENDENT_AMBULATORY_CARE_PROVIDER_SITE_OTHER): Payer: Medicare HMO | Admitting: *Deleted

## 2017-08-06 DIAGNOSIS — I428 Other cardiomyopathies: Secondary | ICD-10-CM | POA: Diagnosis not present

## 2017-08-06 DIAGNOSIS — I5022 Chronic systolic (congestive) heart failure: Secondary | ICD-10-CM

## 2017-08-06 NOTE — Progress Notes (Signed)
Remote ICD transmission.   

## 2017-08-08 LAB — CUP PACEART REMOTE DEVICE CHECK
Battery Remaining Longevity: 5 mo
Battery Voltage: 2.81 V
Brady Statistic AP VP Percent: 0 %
Brady Statistic AP VS Percent: 0 %
Brady Statistic AS VP Percent: 0 %
Brady Statistic AS VS Percent: 0 %
Brady Statistic RA Percent Paced: 0 %
Brady Statistic RV Percent Paced: 99.22 %
Date Time Interrogation Session: 20181029084222
HighPow Impedance: 47 Ohm
HighPow Impedance: 59 Ohm
Implantable Lead Implant Date: 20080908
Implantable Lead Implant Date: 20080908
Implantable Lead Implant Date: 20120207
Implantable Lead Location: 753858
Implantable Lead Location: 753859
Implantable Lead Location: 753860
Implantable Lead Model: 4194
Implantable Lead Model: 5076
Implantable Lead Model: 7121
Implantable Pulse Generator Implant Date: 20150713
Lead Channel Impedance Value: 304 Ohm
Lead Channel Impedance Value: 342 Ohm
Lead Channel Impedance Value: 456 Ohm
Lead Channel Impedance Value: 475 Ohm
Lead Channel Impedance Value: 551 Ohm
Lead Channel Impedance Value: 760 Ohm
Lead Channel Pacing Threshold Amplitude: 0.625 V
Lead Channel Pacing Threshold Amplitude: 3.5 V
Lead Channel Pacing Threshold Pulse Width: 0.4 ms
Lead Channel Pacing Threshold Pulse Width: 1 ms
Lead Channel Sensing Intrinsic Amplitude: 0.375 mV
Lead Channel Sensing Intrinsic Amplitude: 0.875 mV
Lead Channel Sensing Intrinsic Amplitude: 7.5 mV
Lead Channel Sensing Intrinsic Amplitude: 7.5 mV
Lead Channel Setting Pacing Amplitude: 2.5 V
Lead Channel Setting Pacing Amplitude: 4 V
Lead Channel Setting Pacing Pulse Width: 0.4 ms
Lead Channel Setting Pacing Pulse Width: 1 ms
Lead Channel Setting Sensing Sensitivity: 0.3 mV

## 2017-08-09 DIAGNOSIS — M25561 Pain in right knee: Secondary | ICD-10-CM | POA: Diagnosis not present

## 2017-08-14 ENCOUNTER — Encounter: Payer: Self-pay | Admitting: Cardiology

## 2017-08-15 DIAGNOSIS — M25561 Pain in right knee: Secondary | ICD-10-CM | POA: Diagnosis not present

## 2017-08-29 ENCOUNTER — Ambulatory Visit: Payer: Medicare HMO | Admitting: Internal Medicine

## 2017-08-29 ENCOUNTER — Encounter: Payer: Self-pay | Admitting: Internal Medicine

## 2017-08-29 VITALS — BP 124/68 | HR 91 | Ht 68.0 in | Wt 189.0 lb

## 2017-08-29 DIAGNOSIS — Z95810 Presence of automatic (implantable) cardiac defibrillator: Secondary | ICD-10-CM

## 2017-08-29 DIAGNOSIS — I428 Other cardiomyopathies: Secondary | ICD-10-CM

## 2017-08-29 DIAGNOSIS — I5022 Chronic systolic (congestive) heart failure: Secondary | ICD-10-CM

## 2017-08-29 LAB — CUP PACEART INCLINIC DEVICE CHECK
Battery Remaining Longevity: 4 mo
Battery Voltage: 2.8 V
Brady Statistic AP VP Percent: 0 %
Brady Statistic AP VS Percent: 0 %
Brady Statistic AS VP Percent: 100 %
Brady Statistic AS VS Percent: 0 %
Brady Statistic RA Percent Paced: 0 %
Brady Statistic RV Percent Paced: 99.55 %
Date Time Interrogation Session: 20181121142102
HighPow Impedance: 48 Ohm
HighPow Impedance: 61 Ohm
Implantable Lead Implant Date: 20080908
Implantable Lead Implant Date: 20080908
Implantable Lead Implant Date: 20120207
Implantable Lead Location: 753858
Implantable Lead Location: 753859
Implantable Lead Location: 753860
Implantable Lead Model: 4194
Implantable Lead Model: 5076
Implantable Lead Model: 7121
Implantable Pulse Generator Implant Date: 20150713
Lead Channel Impedance Value: 342 Ohm
Lead Channel Impedance Value: 361 Ohm
Lead Channel Impedance Value: 475 Ohm
Lead Channel Impedance Value: 475 Ohm
Lead Channel Impedance Value: 589 Ohm
Lead Channel Impedance Value: 760 Ohm
Lead Channel Pacing Threshold Amplitude: 0.5 V
Lead Channel Pacing Threshold Amplitude: 2.75 V
Lead Channel Pacing Threshold Pulse Width: 0.4 ms
Lead Channel Pacing Threshold Pulse Width: 1 ms
Lead Channel Sensing Intrinsic Amplitude: 0.375 mV
Lead Channel Sensing Intrinsic Amplitude: 0.875 mV
Lead Channel Sensing Intrinsic Amplitude: 7.5 mV
Lead Channel Sensing Intrinsic Amplitude: 7.625 mV
Lead Channel Setting Pacing Amplitude: 2.5 V
Lead Channel Setting Pacing Amplitude: 3.75 V
Lead Channel Setting Pacing Pulse Width: 0.4 ms
Lead Channel Setting Pacing Pulse Width: 1 ms
Lead Channel Setting Sensing Sensitivity: 0.3 mV

## 2017-08-29 NOTE — Progress Notes (Signed)
HPI [name] returns today for ongoing evaluation and management of her ICD and atrial fibrillation. She is a very pleasant 83 year old woman with a long history of atrial fibrillation, initially with rapid ventricular response. She initially underwent AV node ablation and insertion of a biventricular pacemaker, but ultimately was upgraded to an ICD after she developed polymorphic ventricular tachycardia. She has had no recurrent ventricular arrhythmias. Unfortunately her left ventricular pacing threshold has remained chronically elevated resulting in early depletion of her devices. In addition, her pacing vectors have required the RV coil for successful capture with no diaphragmatic stimulation. She is chronically at 3 V at 1 ms to capture in the left ventricle. She has had no syncope. She has very mild peripheral edema. She has not been hospitalized with congestive heart failure. Allergies  Allergen Reactions  . Gabapentin Swelling    Swelling   . Pregabalin Swelling    Swelling   . Ace Inhibitors     Other reaction(s): Angioedema (ALLERGY/intolerance)  . Carvedilol     Other reaction(s): Myalgias (intolerance)  . Insulin Glargine Itching  . Metoprolol     Other reaction(s): Angioedema (ALLERGY/intolerance)  . Morphine Nausea Only  . Procaine Other (See Comments)    unknown  . Statins     Other reaction(s): Myalgias (intolerance)  . Lisinopril     Rash   . Penicillins     Rash   . Tape Rash    TAPE     Current Outpatient Medications  Medication Sig Dispense Refill  . ALPRAZolam (XANAX) 0.5 MG tablet Take 1 tablet by mouth at bedtime.    . digoxin (LANOXIN) 0.125 MG tablet Take 0.125 mg by mouth daily.     Marland Kitchen ezetimibe (ZETIA) 10 MG tablet Take 10 mg by mouth daily.    . fenofibrate micronized (LOFIBRA) 134 MG capsule Take 1 capsule by mouth daily.    . fluticasone (FLONASE) 50 MCG/ACT nasal spray Place 2 sprays into both nostrils daily as needed for allergies or  rhinitis.     . furosemide (LASIX) 40 MG tablet Take 40 mg by mouth daily. May take one extra lasix by mouth daily as needed for swelling    . glipiZIDE (GLUCOTROL) 5 MG tablet Take 5 mg by mouth 2 (two) times daily before a meal.    . Homeopathic Products ([employer]S LEG CRAMPS PAIN RELIEF) TABS Take 1 tablet by mouth daily as needed (for leg cramps).     . Insulin Detemir (LEVEMIR FLEXPEN) 100 UNIT/ML Pen Inject 30 Units into the skin at bedtime.    Marland Kitchen levothyroxine (SYNTHROID, LEVOTHROID) 50 MCG tablet Take 50 mcg by mouth daily.    Marland Kitchen losartan (COZAAR) 100 MG tablet Take 1 tablet by mouth daily.    . magnesium oxide (MAG-OX) 400 MG tablet Take 400 mg by mouth 2 (two) times daily.    . metoprolol succinate (TOPROL-XL) 100 MG 24 hr tablet Take 100 mg by mouth 2 (two) times daily. Take with or immediately following a meal.    . Multiple Vitamins-Minerals (CENTRUM SILVER PO) Take 1 capsule by mouth daily.    Marland Kitchen omega-3 acid ethyl esters (LOVAZA) 1 G capsule Take 2 g by mouth 2 (two) times daily.     Marland Kitchen oxyCODONE-acetaminophen (PERCOCET) 7.5-325 MG per tablet Take 1 tablet by mouth every 4 (four) hours as needed for pain.    . pantoprazole (PROTONIX) 40 MG tablet Take 40 mg by mouth 2 (two) times daily.    Marland Kitchen  potassium chloride SA (K-DUR,KLOR-CON) 20 MEQ tablet Take 2 tablets by mouth daily.    . pravastatin (PRAVACHOL) 20 MG tablet Take 20 mg by mouth at bedtime.    . Rivaroxaban (XARELTO) 15 MG TABS tablet Take 1 tablet (15 mg total) by mouth daily with supper. 30 tablet 11  . rOPINIRole (REQUIP) 1 MG tablet Take 1 tablet by mouth daily.     No current facility-administered medications for this visit.      Past Medical History:  Diagnosis Date  . Amiodarone pulmonary toxicity   . Anemia    no GI bleeding; on iron  . Biventricular ICD (implantable cardiac defibrillator) in place    BiV ICD for nonischemic cardiomyopathy; BiV responder  . Chronic a-fib ([place])    on xarelto; failed DCCV  . Diabetes  ([place])   . H/O cardiovascular stress test 08/04/2010   normal imaging; EF 61%  . H/O echocardiogram 07/05/2010   EF 45-50%; aortic valve-mildly sclerotic; LA-mod dilaterd   . Hyperlipemia   . 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 LBBB (left bundle branch block)   . PVD (peripheral vascular disease) ([place])    L RA stent 1994  . RAS (renal artery stenosis) ([place])    a. renal dopp (12/18/08)- Right RA-<60%; L RA stent- open; nl size and shape in both kidneys;  b.  Rena Artery [name] (3/16):  SMA with > 70% stenosis, Bilateral prox RA with 1-59%  . Renal insufficiency, mild    05/01/12 Creat 1.47  . VT (ventricular tachycardia) ([place]) 9/11   polymorphic VT probably related to sotalol therapy    ROS:   All systems reviewed and negative except as noted in the HPI.   Past Surgical History:  Procedure Laterality Date  . AV NODE ABLATION  06/08/2006   performed by Dr. Beckie Salts; due to Afib with RVR and tachy brady syndrome  . BIV ICD GENERTAOR CHANGE OUT  11/15/10   Medtronic Protecta [id number] serial #[id number]  . BiV ICD Placement  06/17/2007; 04/20/14   Medtronic Concerta [id number]; RA lead-Med 5076/53cm, [id number]; RV lead- SJM 7121/65cm, [id number]; LV lead- Med 4194/88cm, [id number]; gen change 04/2014 by Dr Lovena Le  . BIV PACEMAKER GENERATOR CHANGE OUT N/A 04/20/2014   Procedure: BIV PACEMAKER GENERATOR CHANGE OUT;  Surgeon: Evans Lance, MD;  Location: [facility] CATH LAB;  Service: Cardiovascular;  Laterality: N/A;  . BRAIN MENINGIOMA EXCISION  2011   L frontal [facility] at [facility]   . CARDIOVERSION  07/21/05   converted to sinus brady  . CHOLECYSTECTOMY     Dr. Sherald Hess in [place] and Dr. Lyda Jester follows; surgical stricture post procedure with stent placement  . PV Angio  1994   L Renal artery stent      Family History  Problem Relation Age of Onset  . Dementia Sister   . Heart attack Brother   . Stroke Maternal Grandmother   . Heart failure Mother      Social  History   Socioeconomic History  . Marital status: Married    Spouse name: Not on file  . Number of children: Not on file  . Years of education: Not on file  . Highest education level: Not on file  Social Needs  . Financial resource strain: Not on file  . Food insecurity - worry: Not on file  . Food insecurity - inability: Not on file  . Transportation needs - medical: Not on file  . Transportation needs - non-medical: Not  on file  Occupational History  . Not on file  Tobacco Use  . Smoking status: Never Smoker  . Smokeless tobacco: Never Used  Substance and Sexual Activity  . Alcohol use: No    Alcohol/week: 0.0 oz  . Drug use: No  . Sexual activity: Not on file  Other Topics Concern  . Not on file  Social History Narrative  . Not on file     BP 124/68   Pulse 91   Ht 5\' 8"  (1.727 m)   Wt 189 lb (85.7 kg)   BMI 28.74 kg/m   Physical Exam:  Well appearing 83 year old woman, NAD HEENT: Unremarkable Neck:  No JVD, no thyromegally Lymphatics:  No adenopathy Back:  No CVA tenderness Lungs:  Clear, with no wheezes, rales, or rhonchi HEART:  Regular rate rhythm, no murmurs, no rubs, no clicks Abd:  soft, positive bowel sounds, no organomegally, no rebound, no guarding Ext:  2 plus pulses, no edema, no cyanosis, no clubbing Skin:  No rashes no nodules Neuro:  CN II through XII intact, motor grossly intact  EKG Atrial fibrillation with biventricular pacing DEVICE  Normal device function.  See PaceArt for details.   Assess/Plan: 1. Chronic systolic and diastolic heart failure - her symptoms are class II. She will continue her current medications and maintain a low-sodium diet. 2. Atrial fibrillation - her ventricular rate is well controlled and she is chronically in A. Fib. 3. Biventricular ICD - her device is working normally but is approaching elective replacement. She is a chronically elevated left ventricular pacing threshold. With her multiple comorbidities,  I'm inclined to recommend generator change out at the time of ERI. 4. Ventricular tachycardia - this was polymorphic and likely related to sotalol therapy. She has stopped taking this medication.  Cristopher Peru, M.D.

## 2017-08-29 NOTE — Patient Instructions (Addendum)
Medication Instructions:  Your physician recommends that you continue on your current medications as directed. Please refer to the Current Medication list given to you today.  Labwork: None ordered.  Testing/Procedures: None ordered.  Follow-Up: Your physician wants you to follow-up in: one year with Dr. Lovena Le.   You will receive a reminder letter in the mail two months in advance. If you don't receive a letter, please call our office to schedule the follow-up appointment.  Remote monitoring is used to monitor your ICD from home. This monitoring reduces the number of office visits required to check your device to one time per year. It allows [name] to keep an eye on the functioning of your device to ensure it is working properly. You are scheduled for a device check from home on 11/05/2017. You may send your transmission at any time that day. If you have a wireless device, the transmission will be sent automatically. After your physician reviews your transmission, you will receive a postcard with your next transmission date.   Any Other Special Instructions Will Be Listed Below (If Applicable).   If you need a refill on your cardiac medications before your next appointment, please call your pharmacy.

## 2017-09-03 DIAGNOSIS — E663 Overweight: Secondary | ICD-10-CM | POA: Diagnosis not present

## 2017-09-03 DIAGNOSIS — E782 Mixed hyperlipidemia: Secondary | ICD-10-CM | POA: Diagnosis not present

## 2017-09-03 DIAGNOSIS — N184 Chronic kidney disease, stage 4 (severe): Secondary | ICD-10-CM | POA: Diagnosis not present

## 2017-09-03 DIAGNOSIS — I110 Hypertensive heart disease with heart failure: Secondary | ICD-10-CM | POA: Diagnosis not present

## 2017-09-03 DIAGNOSIS — E1143 Type 2 diabetes mellitus with diabetic autonomic (poly)neuropathy: Secondary | ICD-10-CM | POA: Diagnosis not present

## 2017-09-03 DIAGNOSIS — E038 Other specified hypothyroidism: Secondary | ICD-10-CM | POA: Diagnosis not present

## 2017-09-03 DIAGNOSIS — Z6829 Body mass index (BMI) 29.0-29.9, adult: Secondary | ICD-10-CM | POA: Diagnosis not present

## 2017-09-03 DIAGNOSIS — I481 Persistent atrial fibrillation: Secondary | ICD-10-CM | POA: Diagnosis not present

## 2017-09-13 DIAGNOSIS — Z0001 Encounter for general adult medical examination with abnormal findings: Secondary | ICD-10-CM | POA: Diagnosis not present

## 2017-09-13 DIAGNOSIS — I6521 Occlusion and stenosis of right carotid artery: Secondary | ICD-10-CM | POA: Diagnosis not present

## 2017-09-13 DIAGNOSIS — Z6829 Body mass index (BMI) 29.0-29.9, adult: Secondary | ICD-10-CM | POA: Diagnosis not present

## 2017-09-24 DIAGNOSIS — I6523 Occlusion and stenosis of bilateral carotid arteries: Secondary | ICD-10-CM | POA: Diagnosis not present

## 2017-09-27 DIAGNOSIS — J208 Acute bronchitis due to other specified organisms: Secondary | ICD-10-CM | POA: Diagnosis not present

## 2017-10-10 DIAGNOSIS — J189 Pneumonia, unspecified organism: Secondary | ICD-10-CM | POA: Diagnosis not present

## 2017-10-10 DIAGNOSIS — R05 Cough: Secondary | ICD-10-CM | POA: Diagnosis not present

## 2017-10-10 DIAGNOSIS — R509 Fever, unspecified: Secondary | ICD-10-CM | POA: Diagnosis not present

## 2017-10-15 ENCOUNTER — Other Ambulatory Visit: Payer: Self-pay

## 2017-10-15 DIAGNOSIS — I6521 Occlusion and stenosis of right carotid artery: Secondary | ICD-10-CM

## 2017-11-05 ENCOUNTER — Ambulatory Visit (INDEPENDENT_AMBULATORY_CARE_PROVIDER_SITE_OTHER): Payer: Medicare HMO | Admitting: *Deleted

## 2017-11-05 DIAGNOSIS — I428 Other cardiomyopathies: Secondary | ICD-10-CM

## 2017-11-05 DIAGNOSIS — I5022 Chronic systolic (congestive) heart failure: Secondary | ICD-10-CM

## 2017-11-05 NOTE — Progress Notes (Signed)
Remote ICD transmission.   

## 2017-11-06 LAB — CUP PACEART REMOTE DEVICE CHECK
Battery Remaining Longevity: 2 mo
Battery Voltage: 2.76 V
Brady Statistic AP VP Percent: 0 %
Brady Statistic AP VS Percent: 0 %
Brady Statistic AS VP Percent: 0 %
Brady Statistic AS VS Percent: 0 %
Brady Statistic RA Percent Paced: 0 %
Brady Statistic RV Percent Paced: 98.6 %
Date Time Interrogation Session: 20190128072704
HighPow Impedance: 43 Ohm
HighPow Impedance: 58 Ohm
Implantable Lead Implant Date: 20080908
Implantable Lead Implant Date: 20080908
Implantable Lead Implant Date: 20120207
Implantable Lead Location: 753858
Implantable Lead Location: 753859
Implantable Lead Location: 753860
Implantable Lead Model: 4194
Implantable Lead Model: 5076
Implantable Lead Model: 7121
Implantable Pulse Generator Implant Date: 20150713
Lead Channel Impedance Value: 304 Ohm
Lead Channel Impedance Value: 342 Ohm
Lead Channel Impedance Value: 475 Ohm
Lead Channel Impedance Value: 475 Ohm
Lead Channel Impedance Value: 513 Ohm
Lead Channel Impedance Value: 665 Ohm
Lead Channel Pacing Threshold Amplitude: 0.625 V
Lead Channel Pacing Threshold Amplitude: 2.75 V
Lead Channel Pacing Threshold Pulse Width: 0.4 ms
Lead Channel Pacing Threshold Pulse Width: 1 ms
Lead Channel Sensing Intrinsic Amplitude: 0.375 mV
Lead Channel Sensing Intrinsic Amplitude: 0.875 mV
Lead Channel Sensing Intrinsic Amplitude: 6.375 mV
Lead Channel Sensing Intrinsic Amplitude: 6.375 mV
Lead Channel Setting Pacing Amplitude: 2.5 V
Lead Channel Setting Pacing Amplitude: 3.5 V
Lead Channel Setting Pacing Pulse Width: 0.4 ms
Lead Channel Setting Pacing Pulse Width: 1 ms
Lead Channel Setting Sensing Sensitivity: 0.3 mV

## 2017-11-07 ENCOUNTER — Encounter: Payer: Self-pay | Admitting: Cardiology

## 2017-11-15 ENCOUNTER — Encounter: Payer: Self-pay | Admitting: Vascular Surgery

## 2017-11-15 ENCOUNTER — Ambulatory Visit: Payer: Medicare HMO | Admitting: Vascular Surgery

## 2017-11-15 ENCOUNTER — Ambulatory Visit (HOSPITAL_COMMUNITY)
Admission: RE | Admit: 2017-11-15 | Discharge: 2017-11-15 | Disposition: A | Discharge status: Home / Self Care | Payer: Medicare HMO | Source: Ambulatory Visit | Attending: Vascular Surgery | Admitting: Vascular Surgery

## 2017-11-15 VITALS — BP 156/96 | HR 98 | Temp 97.6°F | Resp 20 | Ht 68.0 in | Wt 184.0 lb

## 2017-11-15 DIAGNOSIS — I6521 Occlusion and stenosis of right carotid artery: Secondary | ICD-10-CM | POA: Insufficient documentation

## 2017-11-15 LAB — VAS US CAROTID
LEFT ECA DIAS: 0 cm/s
LEFT VERTEBRAL DIAS: 10 cm/s
Left CCA dist dias: -20 cm/s
Left CCA dist sys: -139 cm/s
Left CCA prox dias: 4 cm/s
Left CCA prox sys: 161 cm/s
Left ICA dist dias: -21 cm/s
Left ICA dist sys: -134 cm/s
Left ICA prox dias: -15 cm/s
Left ICA prox sys: -94 cm/s
RIGHT CCA MID DIAS: -12 cm/s
RIGHT ECA DIAS: -8 cm/s
RIGHT VERTEBRAL DIAS: -12 cm/s
Right CCA prox dias: 16 cm/s
Right CCA prox sys: 140 cm/s
Right cca dist sys: -41 cm/s

## 2017-11-15 NOTE — Progress Notes (Signed)
Referring Physician: [name] MD Patient name: [name] MRN: [id number] DOB: 1934-09-08 Sex: female  REASON FOR CONSULT: Asymptomatic carotid stenosis  HPI: [name] is a 84 y.o. female referred for evaluation of an asymptomatic carotid stenosis.  The patient did have what was thought to be a stroke about 10 years ago.  It primarily affected her right hand.  She has not had any further events.  She also has a history of atrial fibrillation and is on Xarelto for this.  She is also on a statin.  She also has an AICD.  He denies any symptoms of TIA amaurosis or stroke.  Other chronic medical problems include chronic back pain for which she takes Percocet about 2-3 tablets/day.  She also has a history of a left renal stent in 1994.  She is followed by Dr. Arty Baumgartner and states that she has a baseline serum creatinine of about 3.    Past Medical History:  Diagnosis Date  . Amiodarone pulmonary toxicity   . Anemia    no GI bleeding; on iron  . Atrial fibrillation ([place])   . Biventricular ICD (implantable cardiac defibrillator) in place    BiV ICD for nonischemic cardiomyopathy; BiV responder  . Carotid artery occlusion   . CHF (congestive heart failure) ([place])   . Chronic a-fib ([place])    on xarelto; failed DCCV  . Diabetes ([place])   . H/O cardiovascular stress test 08/04/2010   normal imaging; EF 61%  . H/O echocardiogram 07/05/2010   EF 45-50%; aortic valve-mildly sclerotic; LA-mod dilaterd   . Hyperlipemia   . Hypothalamic disease ([place])    on thyroid supplement  . LBBB (left bundle branch block)   . PVD (peripheral vascular disease) ([place])    L RA stent 1994  . RAS (renal artery stenosis) ([place])    a. renal dopp (12/18/08)- Right RA-<60%; L RA stent- open; nl size and shape in both kidneys;  b.  Rena Artery [name] (3/16):  SMA with > 70% stenosis, Bilateral prox RA with 1-59%  . Renal insufficiency, mild    05/01/12 Creat 1.47  . VT (ventricular tachycardia) ([place]) 9/11   polymorphic VT probably related to sotalol therapy   Past Surgical History:  Procedure Laterality Date  . AV NODE ABLATION  06/08/2006   performed by Dr. Beckie Salts; due to Afib with RVR and tachy brady syndrome  . BIV ICD GENERTAOR CHANGE OUT  11/15/10   Medtronic Protecta [id number] serial #[id number]  . BiV ICD Placement  06/17/2007; 04/20/14   Medtronic Concerta [id number]; RA lead-Med 5076/53cm, [id number]; RV lead- SJM 7121/65cm, [id number]; LV lead- Med 4194/88cm, [id number]; gen change 04/2014 by Dr Lovena Le  . BIV PACEMAKER GENERATOR CHANGE OUT N/A 04/20/2014   Procedure: BIV PACEMAKER GENERATOR CHANGE OUT;  Surgeon: Evans Lance, MD;  Location: [facility] CATH LAB;  Service: Cardiovascular;  Laterality: N/A;  . BRAIN MENINGIOMA EXCISION  2011   L frontal meningioma at [facility]   . CARDIOVERSION  07/21/05   converted to sinus brady  . CHOLECYSTECTOMY     Dr. Sherald Hess in [place] and Dr. Lyda Jester follows; surgical stricture post procedure with stent placement  . PV Angio  1994   L Renal artery stent     Family History  Problem Relation Age of Onset  . Heart failure Mother   . Dementia Sister   . Heart attack Brother   . AAA (abdominal aortic aneurysm) Brother   . Stroke Maternal Grandmother  SOCIAL HISTORY: Social History   Socioeconomic History  . Marital status: Married    Spouse name: Not on file  . Number of children: Not on file  . Years of education: Not on file  . Highest education level: Not on file  Social Needs  . Financial resource strain: Not on file  . Food insecurity - worry: Not on file  . Food insecurity - inability: Not on file  . Transportation needs - medical: Not on file  . Transportation needs - non-medical: Not on file  Occupational History  . Not on file  Tobacco Use  . Smoking status: Never Smoker  . Smokeless tobacco: Never Used  Substance and Sexual Activity  . Alcohol use: No    Alcohol/week: 0.0 oz  . Drug use: No  . Sexual activity: Not on  file  Other Topics Concern  . Not on file  Social History Narrative  . Not on file    Allergies  Allergen Reactions  . Gabapentin Swelling    Swelling   . Pregabalin Swelling    Swelling   . Ace Inhibitors     Other reaction(s): Angioedema (ALLERGY/intolerance)  . Carvedilol     Other reaction(s): Myalgias (intolerance)  . Insulin Glargine Itching  . Metoprolol     Other reaction(s): Angioedema (ALLERGY/intolerance)  . Morphine Nausea Only  . Procaine Other (See Comments)    unknown  . Statins     Other reaction(s): Myalgias (intolerance)  . Lisinopril     Rash   . Penicillins     Rash   . Tape Rash    TAPE    Current Outpatient Medications  Medication Sig Dispense Refill  . ALPRAZolam (XANAX) 0.5 MG tablet Take 1 tablet by mouth at bedtime.    . digoxin (LANOXIN) 0.125 MG tablet Take 0.125 mg by mouth daily.     Marland Kitchen ezetimibe (ZETIA) 10 MG tablet Take 10 mg by mouth daily.    . fenofibrate micronized (LOFIBRA) 134 MG capsule Take 1 capsule by mouth daily.    . fluticasone (FLONASE) 50 MCG/ACT nasal spray Place 2 sprays into both nostrils daily as needed for allergies or rhinitis.     . furosemide (LASIX) 40 MG tablet Take 40 mg by mouth daily. May take one extra lasix by mouth daily as needed for swelling    . Homeopathic Products ([employer] LEG CRAMPS PAIN RELIEF) TABS Take 1 tablet by mouth daily as needed (for leg cramps).     . Insulin Detemir (LEVEMIR FLEXPEN) 100 UNIT/ML Pen Inject 30 Units into the skin at bedtime.    Marland Kitchen levothyroxine (SYNTHROID, LEVOTHROID) 50 MCG tablet Take 50 mcg by mouth daily.    Marland Kitchen losartan (COZAAR) 100 MG tablet Take 1 tablet by mouth daily.    . magnesium oxide (MAG-OX) 400 MG tablet Take 400 mg by mouth 2 (two) times daily.    . metoprolol succinate (TOPROL-XL) 100 MG 24 hr tablet Take 100 mg by mouth 2 (two) times daily. Take with or immediately following a meal.    . Multiple Vitamins-Minerals (CENTRUM SILVER PO) Take 1 capsule by mouth  daily.    Marland Kitchen omega-3 acid ethyl esters (LOVAZA) 1 G capsule Take 2 g by mouth 2 (two) times daily.     Marland Kitchen oxyCODONE-acetaminophen (PERCOCET) 7.5-325 MG per tablet Take 1 tablet by mouth every 4 (four) hours as needed for pain.    . pantoprazole (PROTONIX) 40 MG tablet Take 40 mg by mouth 2 (two) times  daily.    . potassium chloride SA (K-DUR,KLOR-CON) 20 MEQ tablet Take 2 tablets by mouth daily.    . pravastatin (PRAVACHOL) 20 MG tablet Take 20 mg by mouth at bedtime.    . Rivaroxaban (XARELTO) 15 MG TABS tablet Take 1 tablet (15 mg total) by mouth daily with supper. 30 tablet 11  . rOPINIRole (REQUIP) 1 MG tablet Take 1 tablet by mouth daily.    Marland Kitchen glipiZIDE (GLUCOTROL) 5 MG tablet Take 5 mg by mouth 2 (two) times daily before a meal.     No current facility-administered medications for this visit.     ROS:   General:  No weight loss, Fever, chills  HEENT: No recent headaches, no nasal bleeding, no visual changes, no sore throat  Neurologic: No dizziness, blackouts, seizures. No recent symptoms of stroke or mini- stroke. No recent episodes of slurred speech, or temporary blindness.  Cardiac: No recent episodes of chest pain/pressure, no shortness of breath at rest.  No shortness of breath with exertion.  Denies history of atrial fibrillation or irregular heartbeat  Vascular: No history of rest pain in feet.  No history of claudication.  No history of non-healing ulcer, No history of DVT   Pulmonary: No home oxygen, no productive cough, no hemoptysis,  No asthma or wheezing  Musculoskeletal:  [ ]  Arthritis, [X]  Low back pain,  [X]  Joint pain  Hematologic:No history of hypercoagulable state.  No history of easy bleeding.  No history of anemia  Gastrointestinal: No hematochezia or melena,  No gastroesophageal reflux, no trouble swallowing  Urinary: [X]  chronic Kidney disease, [ ]  on HD - [ ]  MWF or [ ]  TTHS, [ ]  Burning with urination, [ ]  Frequent urination, [ ]  Difficulty urinating;    Skin: No rashes  Psychological: No history of anxiety,  No history of depression   Physical Examination  Vitals:   11/15/17 1038 11/15/17 1041  BP: (!) 155/73 (!) 156/96  Pulse: 98   Resp: 20   Temp: 97.6 F (36.4 C)   TempSrc: Oral   SpO2: 100%   Weight: 184 lb (83.5 kg)   Height: 5\' 8"  (1.727 m)     Body mass index is 27.98 kg/m.  General:  Alert and oriented, no acute distress HEENT: Normal Neck: Bilateral soft carotid bruits Pulmonary: Clear to auscultation bilaterally Cardiac: Regular Rate and Rhythm without murmur Abdomen: Soft, non-tender, non-distended, no mass Skin: No rash Extremity Pulses:  2+ radial, brachial, femoral pulses bilaterally Musculoskeletal: No deformity or edema  Neurologic: Upper and lower extremity motor 5/5 and symmetric  DATA:  Patient had a repeat carotid duplex exam in our office today.  This showed 60-80% right internal carotid artery stenosis less than 40% left internal carotid artery stenosis.  This is compared to a previous ultrasound done at [facility] radiology on September 24, 2017 which showed 70% right internal carotid artery stenosis less than 50% left internal carotid artery stenosis  ASSESSMENT: Patient currently asymptomatic with a right internal carotid artery stenosis of 60-80%.  In light of her age lack of symptoms and lack of progression to less than 80% stenosis I believe the best management for now would be continued observation and continuing her Xarelto and her statin.  If she develops any symptoms of TIA amaurosis or stroke we would do further evaluation.  Otherwise she will return in 6 months time with a repeat carotid duplex and see our nurse practitioner.   PLAN: See above   Ruta Hinds, MD [facility] Office: [phone] Pager: [phone]

## 2017-11-20 ENCOUNTER — Other Ambulatory Visit: Payer: Self-pay

## 2017-11-20 DIAGNOSIS — I6529 Occlusion and stenosis of unspecified carotid artery: Secondary | ICD-10-CM

## 2017-11-29 DIAGNOSIS — M722 Plantar fascial fibromatosis: Secondary | ICD-10-CM | POA: Diagnosis not present

## 2017-11-29 DIAGNOSIS — M6702 Short Achilles tendon (acquired), left ankle: Secondary | ICD-10-CM | POA: Diagnosis not present

## 2017-11-29 DIAGNOSIS — E119 Type 2 diabetes mellitus without complications: Secondary | ICD-10-CM | POA: Diagnosis not present

## 2017-11-29 DIAGNOSIS — M76822 Posterior tibial tendinitis, left leg: Secondary | ICD-10-CM | POA: Diagnosis not present

## 2017-12-17 ENCOUNTER — Telehealth: Payer: Self-pay | Admitting: *Deleted

## 2017-12-17 NOTE — Telephone Encounter (Signed)
Remote transmission alert received. Medtronic CRTD ERI as of 12/15/17. Ms. [name] aware and and has heard alert tones x 3 days. I scheduled her to see Dr. Lovena Le 12/24/17 at 11:45- she is agreeable.

## 2017-12-20 DIAGNOSIS — I10 Essential (primary) hypertension: Secondary | ICD-10-CM | POA: Diagnosis not present

## 2017-12-20 DIAGNOSIS — E038 Other specified hypothyroidism: Secondary | ICD-10-CM | POA: Diagnosis not present

## 2017-12-20 DIAGNOSIS — E663 Overweight: Secondary | ICD-10-CM | POA: Diagnosis not present

## 2017-12-20 DIAGNOSIS — N184 Chronic kidney disease, stage 4 (severe): Secondary | ICD-10-CM | POA: Diagnosis not present

## 2017-12-20 DIAGNOSIS — E1143 Type 2 diabetes mellitus with diabetic autonomic (poly)neuropathy: Secondary | ICD-10-CM | POA: Diagnosis not present

## 2017-12-20 DIAGNOSIS — Z6829 Body mass index (BMI) 29.0-29.9, adult: Secondary | ICD-10-CM | POA: Diagnosis not present

## 2017-12-20 DIAGNOSIS — E782 Mixed hyperlipidemia: Secondary | ICD-10-CM | POA: Diagnosis not present

## 2017-12-20 DIAGNOSIS — I110 Hypertensive heart disease with heart failure: Secondary | ICD-10-CM | POA: Diagnosis not present

## 2017-12-20 DIAGNOSIS — I481 Persistent atrial fibrillation: Secondary | ICD-10-CM | POA: Diagnosis not present

## 2017-12-24 ENCOUNTER — Ambulatory Visit: Payer: Medicare HMO | Admitting: Internal Medicine

## 2017-12-24 ENCOUNTER — Other Ambulatory Visit: Payer: Medicare HMO | Admitting: *Deleted

## 2017-12-24 ENCOUNTER — Encounter: Payer: Self-pay | Admitting: Internal Medicine

## 2017-12-24 VITALS — BP 150/82 | HR 92 | Ht 68.0 in | Wt 185.0 lb

## 2017-12-24 DIAGNOSIS — Z95810 Presence of automatic (implantable) cardiac defibrillator: Secondary | ICD-10-CM | POA: Diagnosis not present

## 2017-12-24 DIAGNOSIS — I482 Chronic atrial fibrillation, unspecified: Secondary | ICD-10-CM

## 2017-12-24 DIAGNOSIS — I5022 Chronic systolic (congestive) heart failure: Secondary | ICD-10-CM

## 2017-12-24 DIAGNOSIS — I442 Atrioventricular block, complete: Secondary | ICD-10-CM

## 2017-12-24 NOTE — Progress Notes (Signed)
HPI [name] returns today for followup. She has reached ERI. She has a h/o tachy induced CM, chronic atrial fib, and underwent biV ICD insertion years ago. She has elevated Lv threshold and her only viable pacing configuration was LV tip to RV coil, making use of a BiV PPM not an option. She has no suitable additional LV veins. She has CHB. No escape. She has not had syncope.  Allergies  Allergen Reactions  . Gabapentin Swelling    Swelling   . Pregabalin Swelling    Swelling   . Morphine Nausea Only  . Penicillins Rash    Rash   . Tape Rash    TAPE Other reaction(s): Other (See Comments) TAPE ask  . Ace Inhibitors     Other reaction(s): Angioedema (ALLERGY/intolerance)  . Carvedilol     Other reaction(s): Myalgias (intolerance)  . Insulin Glargine Itching  . Metoprolol     Other reaction(s): Angioedema (ALLERGY/intolerance)  . Procaine Other (See Comments)    unknown  . Statins     Other reaction(s): Myalgias (intolerance) Other reaction(s): Myalgias (intolerance) Other reaction(s): Myalgias (intolerance)  . Lisinopril     Rash      Current Outpatient Medications  Medication Sig Dispense Refill  . ALPRAZolam (XANAX) 0.5 MG tablet Take 1 tablet by mouth at bedtime.    . digoxin (LANOXIN) 0.125 MG tablet Take 0.125 mg by mouth daily.     Marland Kitchen ezetimibe (ZETIA) 10 MG tablet Take 10 mg by mouth daily.    . fenofibrate micronized (LOFIBRA) 134 MG capsule Take 1 capsule by mouth daily.    . fluticasone (FLONASE) 50 MCG/ACT nasal spray Place 2 sprays into both nostrils daily as needed for allergies or rhinitis.     . furosemide (LASIX) 40 MG tablet Take 40 mg by mouth daily. May take one extra lasix by mouth daily as needed for swelling    . Homeopathic Products ([employer]S LEG CRAMPS PAIN RELIEF) TABS Take 1 tablet by mouth daily as needed (for leg cramps).     . Insulin Detemir (LEVEMIR FLEXPEN) 100 UNIT/ML Pen Inject 30 Units into the skin at bedtime.    Marland Kitchen  levothyroxine (SYNTHROID, LEVOTHROID) 50 MCG tablet Take 50 mcg by mouth daily.    Marland Kitchen losartan (COZAAR) 100 MG tablet Take 1 tablet by mouth daily.    . magnesium oxide (MAG-OX) 400 MG tablet Take 400 mg by mouth 2 (two) times daily.    . Multiple Vitamins-Minerals (CENTRUM SILVER PO) Take 1 capsule by mouth daily.    Marland Kitchen omega-3 acid ethyl esters (LOVAZA) 1 G capsule Take 2 g by mouth 2 (two) times daily.     Marland Kitchen oxyCODONE-acetaminophen (PERCOCET) 7.5-325 MG per tablet Take 1 tablet by mouth every 4 (four) hours as needed for pain.    . pantoprazole (PROTONIX) 40 MG tablet Take 40 mg by mouth 2 (two) times daily.    . potassium chloride SA (K-DUR,KLOR-CON) 20 MEQ tablet Take 2 tablets by mouth daily.    . pravastatin (PRAVACHOL) 20 MG tablet Take 20 mg by mouth at bedtime.    . Rivaroxaban (XARELTO) 15 MG TABS tablet Take 1 tablet (15 mg total) by mouth daily with supper. 30 tablet 11  . rOPINIRole (REQUIP) 1 MG tablet Take 1 tablet by mouth daily.     No current facility-administered medications for this visit.      Past Medical History:  Diagnosis Date  . Amiodarone pulmonary toxicity   .  Anemia    no GI bleeding; on iron  . Atrial fibrillation ([place])   . Biventricular ICD (implantable cardiac defibrillator) in place    BiV ICD for nonischemic cardiomyopathy; BiV responder  . Carotid artery occlusion   . CHF (congestive heart failure) ([place])   . Chronic a-fib ([place])    on xarelto; failed DCCV  . Diabetes ([place])   . H/O cardiovascular stress test 08/04/2010   normal imaging; EF 61%  . H/O echocardiogram 07/05/2010   EF 45-50%; aortic valve-mildly sclerotic; LA-mod dilaterd   . Hyperlipemia   . 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 LBBB (left bundle branch block)   . PVD (peripheral vascular disease) ([place])    L RA stent 1994  . RAS (renal artery stenosis) ([place])    a. renal dopp (12/18/08)- Right RA-<60%; L RA stent- open; nl size and shape in both kidneys;  b.  Rena  Artery [name] (3/16):  SMA with > 70% stenosis, Bilateral prox RA with 1-59%  . Renal insufficiency, mild    05/01/12 Creat 1.47  . VT (ventricular tachycardia) ([place]) 9/11   polymorphic VT probably related to sotalol therapy    ROS:   All systems reviewed and negative except as noted in the HPI.   Past Surgical History:  Procedure Laterality Date  . AV NODE ABLATION  06/08/2006   performed by Dr. Beckie Salts; due to Afib with RVR and tachy brady syndrome  . BIV ICD GENERTAOR CHANGE OUT  11/15/10   Medtronic Protecta [id number] serial #[id number]  . BiV ICD Placement  06/17/2007; 04/20/14   Medtronic Concerta [id number]; RA lead-Med 5076/53cm, [id number]; RV lead- SJM 7121/65cm, [id number]; LV lead- Med 4194/88cm, [id number]; gen change 04/2014 by Dr Lovena Le  . BIV PACEMAKER GENERATOR CHANGE OUT N/A 04/20/2014   Procedure: BIV PACEMAKER GENERATOR CHANGE OUT;  Surgeon: Evans Lance, MD;  Location: [facility] C[facility] LAB;  Service: Cardiovascular;  Laterality: N/A;  . BRAIN MENINGIOMA EXCISION  2011   L frontal meningioma at [facility]   . CARDIOVERSION  07/21/05   converted to sinus brady  . CHOLECYSTECTOMY     Dr. Sherald Hess in [place] and Dr. Lyda Jester follows; surgical stricture post procedure with stent placement  . PV Angio  1994   L Renal artery stent      Family History  Problem Relation Age of Onset  . Heart failure Mother   . Dementia Sister   . Heart attack Brother   . AAA (abdominal aortic aneurysm) Brother   . Stroke Maternal Grandmother      Social History   Socioeconomic History  . Marital status: Married    Spouse name: Not on file  . Number of children: Not on file  . Years of education: Not on file  . Highest education level: Not on file  Social Needs  . Financial resource strain: Not on file  . Food insecurity - worry: Not on file  . Food insecurity - inability: Not on file  . Transportation needs - medical: Not on file  . Transportation needs - non-medical: Not on file    Occupational History  . Not on file  Tobacco Use  . Smoking status: Never Smoker  . Smokeless tobacco: Never Used  Substance and Sexual Activity  . Alcohol use: No    Alcohol/week: 0.0 oz  . Drug use: No  . Sexual activity: Not on file  Other Topics Concern  . Not on file  Social History Narrative  .  Not on file     BP (!) 150/82   Pulse 92   Ht 5\' 8"  (1.727 m)   Wt 185 lb (83.9 kg)   BMI 28.13 kg/m   Physical Exam:  Well appearing 84 yo woman, NAD HEENT: Unremarkable Neck:  7 cm JVD, no thyromegally Lymphatics:  No adenopathy Back:  No CVA tenderness Lungs:  Clear, with no wheezes HEART:  Regular rate rhythm, no murmurs, no rubs, no clicks Abd:  soft, positive bowel sounds, no organomegally, no rebound, no guarding Ext:  2 plus pulses, no edema, no cyanosis, no clubbing Skin:  No rashes no nodules Neuro:  CN II through XII intact, motor grossly intact  EKG - atrial fib with biv pacing at 140 ms.   DEVICE  Normal device function.  See PaceArt for details. ERI  Assess/Plan: 1. ICD - her device is at [facility]. She does not need an ICD except for LV pacing. I will plan to attempt insertion of a His bundle lead when she undergoes gen change. 2. Chronic systolic/diastolic heart failure - she has severe CHF when she has RV apically paced in the past. She will continue her current meds. 3. Atrial fib - her rates are well controlled. Will follow. 4. Peripheral vascular disease - she denies claudication or neurological symptoms. Will follow.  Mikle Bosworth.D.

## 2017-12-24 NOTE — H&P (View-Only) (Signed)
HPI [name] returns today for followup. She has reached ERI. She has a h/o tachy induced CM, chronic atrial fib, and underwent biV ICD insertion years ago. She has elevated Lv threshold and her only viable pacing configuration was LV tip to RV coil, making use of a BiV PPM not an option. She has no suitable additional LV veins. She has CHB. No escape. She has not had syncope.  Allergies  Allergen Reactions  . Gabapentin Swelling    Swelling   . Pregabalin Swelling    Swelling   . Morphine Nausea Only  . Penicillins Rash    Rash   . Tape Rash    TAPE Other reaction(s): Other (See Comments) TAPE ask  . Ace Inhibitors     Other reaction(s): Angioedema (ALLERGY/intolerance)  . Carvedilol     Other reaction(s): Myalgias (intolerance)  . Insulin Glargine Itching  . Metoprolol     Other reaction(s): Angioedema (ALLERGY/intolerance)  . Procaine Other (See Comments)    unknown  . Statins     Other reaction(s): Myalgias (intolerance) Other reaction(s): Myalgias (intolerance) Other reaction(s): Myalgias (intolerance)  . Lisinopril     Rash      Current Outpatient Medications  Medication Sig Dispense Refill  . ALPRAZolam (XANAX) 0.5 MG tablet Take 1 tablet by mouth at bedtime.    . digoxin (LANOXIN) 0.125 MG tablet Take 0.125 mg by mouth daily.     Marland Kitchen ezetimibe (ZETIA) 10 MG tablet Take 10 mg by mouth daily.    . fenofibrate micronized (LOFIBRA) 134 MG capsule Take 1 capsule by mouth daily.    . fluticasone (FLONASE) 50 MCG/ACT nasal spray Place 2 sprays into both nostrils daily as needed for allergies or rhinitis.     . furosemide (LASIX) 40 MG tablet Take 40 mg by mouth daily. May take one extra lasix by mouth daily as needed for swelling    . Homeopathic Products ([employer] LEG CRAMPS PAIN RELIEF) TABS Take 1 tablet by mouth daily as needed (for leg cramps).     . Insulin Detemir (LEVEMIR FLEXPEN) 100 UNIT/ML Pen Inject 30 Units into the skin at bedtime.    Marland Kitchen  levothyroxine (SYNTHROID, LEVOTHROID) 50 MCG tablet Take 50 mcg by mouth daily.    Marland Kitchen losartan (COZAAR) 100 MG tablet Take 1 tablet by mouth daily.    . magnesium oxide (MAG-OX) 400 MG tablet Take 400 mg by mouth 2 (two) times daily.    . Multiple Vitamins-Minerals (CENTRUM SILVER PO) Take 1 capsule by mouth daily.    Marland Kitchen omega-3 acid ethyl esters (LOVAZA) 1 G capsule Take 2 g by mouth 2 (two) times daily.     Marland Kitchen oxyCODONE-acetaminophen (PERCOCET) 7.5-325 MG per tablet Take 1 tablet by mouth every 4 (four) hours as needed for pain.    . pantoprazole (PROTONIX) 40 MG tablet Take 40 mg by mouth 2 (two) times daily.    . potassium chloride SA (K-DUR,KLOR-CON) 20 MEQ tablet Take 2 tablets by mouth daily.    . pravastatin (PRAVACHOL) 20 MG tablet Take 20 mg by mouth at bedtime.    . Rivaroxaban (XARELTO) 15 MG TABS tablet Take 1 tablet (15 mg total) by mouth daily with supper. 30 tablet 11  . rOPINIRole (REQUIP) 1 MG tablet Take 1 tablet by mouth daily.     No current facility-administered medications for this visit.      Past Medical History:  Diagnosis Date  . Amiodarone pulmonary toxicity   .  Anemia    no GI bleeding; on iron  . Atrial fibrillation ([place])   . Biventricular ICD (implantable cardiac defibrillator) in place    BiV ICD for nonischemic cardiomyopathy; BiV responder  . Carotid artery occlusion   . CHF (congestive heart failure) ([place])   . Chronic a-fib ([place])    on xarelto; failed DCCV  . Diabetes ([place])   . H/O cardiovascular stress test 08/04/2010   normal imaging; EF 61%  . H/O echocardiogram 07/05/2010   EF 45-50%; aortic valve-mildly sclerotic; LA-mod dilaterd   . Hyperlipemia   . 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 LBBB (left bundle branch block)   . PVD (peripheral vascular disease) ([place])    L RA stent 1994  . RAS (renal artery stenosis) ([place])    a. renal dopp (12/18/08)- Right RA-<60%; L RA stent- open; nl size and shape in both kidneys;  b.  Rena  Artery [name] (3/16):  SMA with > 70% stenosis, Bilateral prox RA with 1-59%  . Renal insufficiency, mild    05/01/12 Creat 1.47  . VT (ventricular tachycardia) ([place]) 9/11   polymorphic VT probably related to sotalol therapy    ROS:   All systems reviewed and negative except as noted in the HPI.   Past Surgical History:  Procedure Laterality Date  . AV NODE ABLATION  06/08/2006   performed by Dr. Beckie Salts; due to Afib with RVR and tachy brady syndrome  . BIV ICD GENERTAOR CHANGE OUT  11/15/10   Medtronic Protecta [id number] serial #[id number]  . BiV ICD Placement  06/17/2007; 04/20/14   Medtronic Concerta [id number]; RA lead-Med 5076/53cm, [id number]; RV lead- SJM 7121/65cm, [id number]; LV lead- Med 4194/88cm, [id number]; gen change 04/2014 by Dr Lovena Le  . BIV PACEMAKER GENERATOR CHANGE OUT N/A 04/20/2014   Procedure: BIV PACEMAKER GENERATOR CHANGE OUT;  Surgeon: Evans Lance, MD;  Location: [facility] C[facility] LAB;  Service: Cardiovascular;  Laterality: N/A;  . BRAIN MENINGIOMA EXCISION  2011   L frontal meningioma at [facility]   . CARDIOVERSION  07/21/05   converted to sinus brady  . CHOLECYSTECTOMY     Dr. Sherald Hess in [place] and Dr. Lyda Jester follows; surgical stricture post procedure with stent placement  . PV Angio  1994   L Renal artery stent      Family History  Problem Relation Age of Onset  . Heart failure Mother   . Dementia Sister   . Heart attack Brother   . AAA (abdominal aortic aneurysm) Brother   . Stroke Maternal Grandmother      Social History   Socioeconomic History  . Marital status: Married    Spouse name: Not on file  . Number of children: Not on file  . Years of education: Not on file  . Highest education level: Not on file  Social Needs  . Financial resource strain: Not on file  . Food insecurity - worry: Not on file  . Food insecurity - inability: Not on file  . Transportation needs - medical: Not on file  . Transportation needs - non-medical: Not on file    Occupational History  . Not on file  Tobacco Use  . Smoking status: Never Smoker  . Smokeless tobacco: Never Used  Substance and Sexual Activity  . Alcohol use: No    Alcohol/week: 0.0 oz  . Drug use: No  . Sexual activity: Not on file  Other Topics Concern  . Not on file  Social History Narrative  .  Not on file     BP (!) 150/82   Pulse 92   Ht 5\' 8"  (1.727 m)   Wt 185 lb (83.9 kg)   BMI 28.13 kg/m   Physical Exam:  Well appearing 84 yo woman, NAD HEENT: Unremarkable Neck:  7 cm JVD, no thyromegally Lymphatics:  No adenopathy Back:  No CVA tenderness Lungs:  Clear, with no wheezes HEART:  Regular rate rhythm, no murmurs, no rubs, no clicks Abd:  soft, positive bowel sounds, no organomegally, no rebound, no guarding Ext:  2 plus pulses, no edema, no cyanosis, no clubbing Skin:  No rashes no nodules Neuro:  CN II through XII intact, motor grossly intact  EKG - atrial fib with biv pacing at 140 ms.   DEVICE  Normal device function.  See PaceArt for details. ERI  Assess/Plan: 1. ICD - her device is at [facility]. She does not need an ICD except for LV pacing. I will plan to attempt insertion of a His bundle lead when she undergoes gen change. 2. Chronic systolic/diastolic heart failure - she has severe CHF when she has RV apically paced in the past. She will continue her current meds. 3. Atrial fib - her rates are well controlled. Will follow. 4. Peripheral vascular disease - she denies claudication or neurological symptoms. Will follow.  Mikle Bosworth.D.

## 2017-12-24 NOTE — Patient Instructions (Addendum)
Medication Instructions:  Your physician recommends that you continue on your current medications as directed. Please refer to the Current Medication list given to you today.  Labwork: You will get lab work today:  BMP and CBC.  Testing/Procedures: Your physician has recommended that you have a defibrillator inserted. An implantable cardioverter defibrillator (ICD) is a small device that is placed in your chest or, in rare cases, your abdomen. This device uses electrical pulses or shocks to help control life-threatening, irregular heartbeats that could lead the heart to suddenly stop beating (sudden cardiac arrest). Leads are attached to the ICD that goes into your heart. This is done in the hospital and usually requires an overnight stay. Please see the instruction sheet given to you today for more information.  You are being scheduled for a generator change for your defibrillator  Follow-Up: You will follow up with device clinic 10-14 days after your procedure for a wound check.  You will follow up with Dr. Lovena Le 91 days after your procedure.  Any Other Special Instructions Will Be Listed Below (If Applicable).  Please arrive at the [facility] main entrance of [facility] hospital at:  12:00 pm on December 31, 2017 Use the CHG surgical scrub as directed. You may have a light breakfast before 7:00 am.  Do not eat after 7:00 am.   DO NOT take your XARELTO for 2 days prior to your procedure.  Your last dose will be December 28, 2017.   On the AM of your procedure take your morning medications with a sip of water except for your furosemide (lasix). Hold insulin night before. Plan for one night stay-but you may be discharged home the same day. You will need someone to drive you home at discharge  If you need a refill on your cardiac medications before your next appointment, please call your pharmacy.

## 2017-12-25 LAB — CUP PACEART INCLINIC DEVICE CHECK
Battery Remaining Longevity: 1 mo — CL
Battery Voltage: 2.69 V
Brady Statistic AP VP Percent: 0 %
Brady Statistic AP VS Percent: 0 %
Brady Statistic AS VP Percent: 0 %
Brady Statistic AS VS Percent: 0 %
Brady Statistic RA Percent Paced: 0 %
Brady Statistic RV Percent Paced: 98.73 %
Date Time Interrogation Session: 20190318155551
HighPow Impedance: 46 Ohm
HighPow Impedance: 62 Ohm
Implantable Lead Implant Date: 20080908
Implantable Lead Implant Date: 20080908
Implantable Lead Implant Date: 20120207
Implantable Lead Location: 753858
Implantable Lead Location: 753859
Implantable Lead Location: 753860
Implantable Lead Model: 4194
Implantable Lead Model: 5076
Implantable Lead Model: 7121
Implantable Pulse Generator Implant Date: 20150713
Lead Channel Impedance Value: 304 Ohm
Lead Channel Impedance Value: 342 Ohm
Lead Channel Impedance Value: 475 Ohm
Lead Channel Impedance Value: 475 Ohm
Lead Channel Impedance Value: 532 Ohm
Lead Channel Impedance Value: 703 Ohm
Lead Channel Pacing Threshold Amplitude: 0.75 V
Lead Channel Pacing Threshold Amplitude: 3 V
Lead Channel Pacing Threshold Pulse Width: 0.4 ms
Lead Channel Pacing Threshold Pulse Width: 1 ms
Lead Channel Sensing Intrinsic Amplitude: 0.375 mV
Lead Channel Sensing Intrinsic Amplitude: 0.875 mV
Lead Channel Sensing Intrinsic Amplitude: 6.375 mV
Lead Channel Sensing Intrinsic Amplitude: 7.5 mV
Lead Channel Setting Pacing Amplitude: 2.5 V
Lead Channel Setting Pacing Amplitude: 4 V
Lead Channel Setting Pacing Pulse Width: 0.4 ms
Lead Channel Setting Pacing Pulse Width: 1 ms
Lead Channel Setting Sensing Sensitivity: 0.3 mV

## 2017-12-25 LAB — BASIC METABOLIC PANEL
BUN/Creatinine Ratio: 22 (ref 12–28)
BUN: 36 mg/dL — ABNORMAL HIGH (ref 8–27)
CO2: 27 mmol/L (ref 20–29)
Calcium: 10 mg/dL (ref 8.7–10.3)
Chloride: 96 mmol/L (ref 96–106)
Creatinine, Ser: 1.65 mg/dL — ABNORMAL HIGH (ref 0.57–1.00)
GFR calc Af Amer: 33 mL/min/{1.73_m2} — ABNORMAL LOW (ref >59)
GFR calc non Af Amer: 29 mL/min/{1.73_m2} — ABNORMAL LOW (ref >59)
Glucose: 104 mg/dL — ABNORMAL HIGH (ref 65–99)
Potassium: 4.5 mmol/L (ref 3.5–5.2)
Sodium: 142 mmol/L (ref 134–144)

## 2017-12-25 LAB — CBC WITH DIFFERENTIAL/PLATELET
Basophils Absolute: 0 x10E3/uL (ref 0.0–0.2)
Basos: 1 %
EOS (ABSOLUTE): 0.1 x10E3/uL (ref 0.0–0.4)
Eos: 1 %
Hematocrit: 39.3 % (ref 34.0–46.6)
Hemoglobin: 12.2 g/dL (ref 11.1–15.9)
Immature Grans (Abs): 0 x10E3/uL (ref 0.0–0.1)
Immature Granulocytes: 0 %
Lymphocytes Absolute: 1.6 x10E3/uL (ref 0.7–3.1)
Lymphs: 23 %
MCH: 27.7 pg (ref 26.6–33.0)
MCHC: 31 g/dL — ABNORMAL LOW (ref 31.5–35.7)
MCV: 89 fL (ref 79–97)
Monocytes Absolute: 0.5 x10E3/uL (ref 0.1–0.9)
Monocytes: 6 %
Neutrophils Absolute: 5 x10E3/uL (ref 1.4–7.0)
Neutrophils: 69 %
Platelets: 355 x10E3/uL (ref 150–379)
RBC: 4.41 x10E6/uL (ref 3.77–5.28)
RDW: 15.1 % (ref 12.3–15.4)
WBC: 7.3 x10E3/uL (ref 3.4–10.8)

## 2017-12-28 ENCOUNTER — Telehealth: Payer: Self-pay

## 2017-12-28 ENCOUNTER — Other Ambulatory Visit: Payer: Self-pay | Admitting: Internal Medicine

## 2017-12-28 NOTE — Telephone Encounter (Signed)
Call placed to Pt.  Pt with newly increased Cr of 1.65.  Discussed with Pt.  Encouraged Pt to increase fluid intake for a few days.  Asked Pt to take 40 mg furosemide daily instead of BID for 2 days and follow with her PCP.  Will send lab results to her PCP.  Dr. Lovena Le made aware.

## 2017-12-31 ENCOUNTER — Observation Stay (HOSPITAL_COMMUNITY)
Admission: RE | Admit: 2017-12-31 | Discharge: 2018-01-01 | Disposition: A | Discharge status: Home / Self Care | Payer: Medicare HMO | Source: Ambulatory Visit | Attending: Internal Medicine | Admitting: Internal Medicine

## 2017-12-31 ENCOUNTER — Encounter (HOSPITAL_COMMUNITY)
Admission: RE | Disposition: A | Discharge status: Home / Self Care | Payer: Self-pay | Source: Ambulatory Visit | Attending: Internal Medicine

## 2017-12-31 ENCOUNTER — Other Ambulatory Visit: Payer: Self-pay

## 2017-12-31 ENCOUNTER — Encounter (HOSPITAL_COMMUNITY): Payer: Self-pay

## 2017-12-31 DIAGNOSIS — Z79899 Other long term (current) drug therapy: Secondary | ICD-10-CM | POA: Insufficient documentation

## 2017-12-31 DIAGNOSIS — Z794 Long term (current) use of insulin: Secondary | ICD-10-CM | POA: Insufficient documentation

## 2017-12-31 DIAGNOSIS — Z7901 Long term (current) use of anticoagulants: Secondary | ICD-10-CM | POA: Insufficient documentation

## 2017-12-31 DIAGNOSIS — E1151 Type 2 diabetes mellitus with diabetic peripheral angiopathy without gangrene: Secondary | ICD-10-CM | POA: Diagnosis not present

## 2017-12-31 DIAGNOSIS — I442 Atrioventricular block, complete: Secondary | ICD-10-CM | POA: Diagnosis not present

## 2017-12-31 DIAGNOSIS — Z4502 Encounter for adjustment and management of automatic implantable cardiac defibrillator: Principal | ICD-10-CM | POA: Insufficient documentation

## 2017-12-31 DIAGNOSIS — Z885 Allergy status to narcotic agent status: Secondary | ICD-10-CM | POA: Insufficient documentation

## 2017-12-31 DIAGNOSIS — Z888 Allergy status to other drugs, medicaments and biological substances status: Secondary | ICD-10-CM | POA: Insufficient documentation

## 2017-12-31 DIAGNOSIS — I482 Chronic atrial fibrillation: Secondary | ICD-10-CM | POA: Insufficient documentation

## 2017-12-31 DIAGNOSIS — I447 Left bundle-branch block, unspecified: Secondary | ICD-10-CM | POA: Insufficient documentation

## 2017-12-31 DIAGNOSIS — I5042 Chronic combined systolic (congestive) and diastolic (congestive) heart failure: Secondary | ICD-10-CM | POA: Diagnosis not present

## 2017-12-31 DIAGNOSIS — E785 Hyperlipidemia, unspecified: Secondary | ICD-10-CM | POA: Insufficient documentation

## 2017-12-31 DIAGNOSIS — Z880 Allergy status to penicillin: Secondary | ICD-10-CM | POA: Diagnosis not present

## 2017-12-31 DIAGNOSIS — Z006 Encounter for examination for normal comparison and control in clinical research program: Secondary | ICD-10-CM | POA: Diagnosis not present

## 2017-12-31 DIAGNOSIS — I429 Cardiomyopathy, unspecified: Secondary | ICD-10-CM

## 2017-12-31 DIAGNOSIS — Z95810 Presence of automatic (implantable) cardiac defibrillator: Secondary | ICD-10-CM | POA: Diagnosis present

## 2017-12-31 HISTORY — PX: BIV ICD GENERATOR CHANGEOUT: EP1194

## 2017-12-31 LAB — SURGICAL PCR SCREEN
MRSA, PCR: NEGATIVE
Staphylococcus aureus: NEGATIVE

## 2017-12-31 LAB — GLUCOSE, CAPILLARY
Glucose-Capillary: 126 mg/dL — ABNORMAL HIGH (ref 65–99)
Glucose-Capillary: 112 mg/dL — ABNORMAL HIGH (ref 65–99)

## 2017-12-31 SURGERY — BIV ICD GENERATOR CHANGEOUT

## 2017-12-31 MED ORDER — MIDAZOLAM HCL 5 MG/5ML IJ SOLN
INTRAMUSCULAR | Status: AC
  Filled 2017-12-31: qty 5

## 2017-12-31 MED ORDER — LIDOCAINE HCL (PF) 1 % IJ SOLN
INTRAMUSCULAR | Status: AC
  Filled 2017-12-31: qty 60

## 2017-12-31 MED ORDER — SODIUM CHLORIDE 0.9 % IR SOLN
Status: AC
  Filled 2017-12-31: qty 2

## 2017-12-31 MED ORDER — CHLORHEXIDINE GLUCONATE 4 % EX LIQD: 60.0000 mL | Freq: Once | CUTANEOUS | Status: DC

## 2017-12-31 MED ORDER — VANCOMYCIN HCL IN DEXTROSE 1-5 GM/200ML-% IV SOLN
1000.0000 mg | Freq: Two times a day (BID) | INTRAVENOUS | Status: AC
  Administered 2018-01-01: 1000 mg via INTRAVENOUS
  Filled 2017-12-31: qty 200

## 2017-12-31 MED ORDER — MIDAZOLAM HCL 5 MG/5ML IJ SOLN
INTRAMUSCULAR | Status: DC | PRN
  Administered 2017-12-31 (×7): 1 mg via INTRAVENOUS

## 2017-12-31 MED ORDER — FENTANYL CITRATE (PF) 100 MCG/2ML IJ SOLN
INTRAMUSCULAR | Status: DC | PRN
  Administered 2017-12-31 (×7): 12.5 ug via INTRAVENOUS

## 2017-12-31 MED ORDER — ACETAMINOPHEN 325 MG PO TABS
325.0000 mg | ORAL_TABLET | ORAL | Status: DC | PRN
  Administered 2017-12-31 – 2018-01-01 (×2): 650 mg via ORAL
  Filled 2017-12-31 (×2): qty 2

## 2017-12-31 MED ORDER — VANCOMYCIN HCL IN DEXTROSE 1-5 GM/200ML-% IV SOLN
1000.0000 mg | INTRAVENOUS | Status: AC
  Administered 2017-12-31: 1000 mg via INTRAVENOUS

## 2017-12-31 MED ORDER — FENTANYL CITRATE (PF) 100 MCG/2ML IJ SOLN
INTRAMUSCULAR | Status: AC
  Filled 2017-12-31: qty 2

## 2017-12-31 MED ORDER — IOPAMIDOL (ISOVUE-370) INJECTION 76%
INTRAVENOUS | Status: AC
  Filled 2017-12-31: qty 50

## 2017-12-31 MED ORDER — HEPARIN (PORCINE) IN NACL 2-0.9 UNIT/ML-% IJ SOLN
INTRAMUSCULAR | Status: AC | PRN
  Administered 2017-12-31: 500 mL

## 2017-12-31 MED ORDER — SODIUM CHLORIDE 0.9 % IR SOLN
80.0000 mg | Status: AC
  Administered 2017-12-31: 80 mg

## 2017-12-31 MED ORDER — LIDOCAINE HCL (PF) 1 % IJ SOLN
INTRAMUSCULAR | Status: DC | PRN
  Administered 2017-12-31: 60 mL

## 2017-12-31 MED ORDER — ONDANSETRON HCL 4 MG/2ML IJ SOLN: 4.0000 mg | Freq: Four times a day (QID) | INTRAMUSCULAR | Status: DC | PRN

## 2017-12-31 MED ORDER — MUPIROCIN 2 % EX OINT
TOPICAL_OINTMENT | CUTANEOUS | Status: AC
  Administered 2017-12-31: 1
  Filled 2017-12-31: qty 22

## 2017-12-31 MED ORDER — VANCOMYCIN HCL IN DEXTROSE 1-5 GM/200ML-% IV SOLN
INTRAVENOUS | Status: AC
  Filled 2017-12-31: qty 200

## 2017-12-31 MED ORDER — IOPAMIDOL (ISOVUE-370) INJECTION 76%
INTRAVENOUS | Status: DC | PRN
  Administered 2017-12-31: 10 mL via INTRAVENOUS

## 2017-12-31 MED ORDER — IOPAMIDOL (ISOVUE-370) INJECTION 76%
INTRAVENOUS | Status: DC | PRN
  Administered 2017-12-31: 30 mL via INTRA_ARTERIAL

## 2017-12-31 MED ORDER — SODIUM CHLORIDE 0.9 % IV SOLN
INTRAVENOUS | Status: DC
  Administered 2017-12-31: 13:00:00 via INTRAVENOUS

## 2017-12-31 SURGICAL SUPPLY — 18 items
ADAPTER SEALING SSSA-09 (ADAPTER) ×1 IMPLANT
CABLE SURGICAL S-101-97-12 (CABLE) ×1 IMPLANT
CATH ATTAIN COM SURV 6250V-MB2 (CATHETERS) ×1 IMPLANT
CATH ATTAIN SEL SURV 6248V-130 (CATHETERS) ×1 IMPLANT
CATH HEX JOS 2-5-2 65CM 6F REP (CATHETERS) ×1 IMPLANT
CATH RIGHTSITE C315HIS02 (CATHETERS) ×1 IMPLANT
KIT ESSENTIALS PG (KITS) ×1 IMPLANT
LEAD SELECT SECURE 3830 383069 (Lead) IMPLANT
PACEMAKER PRCT MRI CRTP W1TR01 (Pacemaker) IMPLANT
PAD DEFIB LIFELINK (PAD) ×1 IMPLANT
PPM PRECEPTA MRI CRT-P W1TR01 (Pacemaker) ×2 IMPLANT
SELECT SECURE 3830 383069 (Lead) ×2 IMPLANT
SHEATH CLASSIC 9.5F (SHEATH) ×1 IMPLANT
SLITTER 6232ADJ (MISCELLANEOUS) ×1 IMPLANT
TRAY PACEMAKER INSERTION (PACKS) ×1 IMPLANT
WIRE ACUITY WHISPER EDS 4648 (WIRE) ×1 IMPLANT
WIRE HI TORQ VERSACORE-J 145CM (WIRE) ×1 IMPLANT
WIRE LUGE 182CM (WIRE) ×1 IMPLANT

## 2017-12-31 NOTE — Interval H&P Note (Signed)
History and Physical Interval Note:  12/31/2017 2:07 PM  [name]  has presented today for surgery, with the diagnosis of eri  The various methods of treatment have been discussed with the patient and family. After consideration of risks, benefits and other options for treatment, the patient has consented to  Procedure(s): BIV ICD [place] (N/A) as a surgical intervention .  The patient's history has been reviewed, patient examined, no change in status, stable for surgery.  I have reviewed the patient's chart and labs.  Questions were answered to the patient's satisfaction.     Cristopher Peru

## 2017-12-31 NOTE — Plan of Care (Signed)
  Problem: Clinical Measurements: Goal: Will remain free from infection Outcome: Progressing   Problem: Pain Managment: Goal: General experience of comfort will improve Outcome: Progressing   Problem: Safety: Goal: Ability to remain free from injury will improve Outcome: Progressing   

## 2017-12-31 NOTE — Progress Notes (Signed)
Received pt from cath lab. AAOx4. S/P ICD implantation . Left chest dsg intact. Noted pinpoint blood stain, time and dated.sling in use to left arm.VS taken and recorded. Hematoma noted rt foot. Pt claims she dropped a pan accidentally at home. Tender and slightly swollen.telemetry on.

## 2017-12-31 NOTE — Discharge Summary (Addendum)
ELECTROPHYSIOLOGY PROCEDURE DISCHARGE SUMMARY    Patient ID: [name],  MRN: [id number], DOB/AGE: 11-Dec-1933 84 y.o.  Admit date: 12/31/2017 Discharge date: 01/01/18  Primary Care Physician: [name], MD  Primary Cardiologist/Electrophysiologist: Dr. Lovena Le  Primary Discharge Diagnosis:  1. ICD at [facility] 2. Permanent AFib     CHA2DS2Vasc is at least 5 on xarelto  Secondary Discharge Diagnosis:  1. DM 2. Chronic CHF (systolic) 3. LBBB 4. CHB  Allergies  Allergen Reactions  . Gabapentin Swelling  . Pregabalin Swelling       . Morphine Nausea Only  . Penicillins Rash and Other (See Comments)    Has patient had a PCN reaction causing immediate rash, facial/tongue/throat swelling, SOB or lightheadedness with hypotension: No Has patient had a PCN reaction causing severe rash involving mucus membranes or skin necrosis: No Has patient had a PCN reaction that required hospitalization: No - MD office Has patient had a PCN reaction occurring within the last 10 years: No If all of the above answers are "NO", then may proceed with Cephalosporin use.   . Tape Rash  . Ace Inhibitors Other (See Comments)    Angioedema (ALLERGY/intolerance)  . Carvedilol Other (See Comments)    Myalgias (intolerance)  . Insulin Glargine Itching  . Metoprolol Other (See Comments)    Angioedema (ALLERGY/intolerance)  . Procaine Other (See Comments)    unknown  . Statins Other (See Comments)    Myalgias (intolerance)  . Lisinopril Rash          Procedures This Admission:  1.  Implantation of a MDT HIS pacing lead, capping RA lead, downgrade of CRT-D to PPM on 12/31/17 by Dr Lovena Le.  Conclusion: Successful insertion of a His bundle pacing lead and unsuccessful insertion of a new left ventricular pacing lead secondary to the patient's limited venous anatomy.  The old defibrillator was removed and a new biventricular pacemaker was inserted utilizing the His bundle lead placed in the  atrial port, the old LV lead which had a high threshold placed in the LV port, and the old right since RV lead placed in the RV port of the new device. There were no immediate post procedure complications. 2.  CXR on 01/01/18 demonstrated no pneumothorax status post device implantation.   Brief HPI: [name] is a 84 y.o. female is followed out patient.  Her CRT-D had reached ERI with plans to downgrade to PPM and plans for HIS pacing given high LV outputs and hx of CHF w/RV pacing alone. Risks, benefits, and alternatives to the planned procedure were reviewed with the patient who wished to proceed.   Hospital Course:  The patient was admitted and underwent her procedure with details as outlined above.She was monitored on telemetry overnight which demonstrated AF/VPacing.  Left chest was without hematoma or ecchymosis.  The device was interrogated and found to be functioning normally/as programmed.  CXR was obtained and demonstrated no pneumothorax status post device implantation.  Wound care, arm mobility, and restrictions were reviewed with the patient.  The patient was examined and considered stable for discharge to home.    Physical Exam: Vitals:   12/31/17 2040 12/31/17 2101 01/01/18 0013 01/01/18 0409  BP:  (!) 155/44 (!) 148/63 (!) 153/80  Pulse:  74 67 69  Resp:  18 18 18   Temp:  98.2 F (36.8 C) 98.6 F (37 C) 98.3 F (36.8 C)  TempSrc:  Oral Oral Oral  SpO2:  96% 100% 94%  Weight: 181  lb 12.8 oz (82.5 kg)   181 lb 4.8 oz (82.2 kg)  Height: 5\' 8"  (1.727 m)       GEN- The patient is well appearing, alert and oriented x 3 today.   HEENT: normocephalic, atraumatic; sclera clear, conjunctiva pink; hearing intact; oropharynx clear Lungs- CTA b/l, normal work of breathing.  No wheezes, rales, rhonchi Heart- RRR, no murmurs, rubs or gallops, PMI not laterally displaced GI- soft, non-tender, non-distended Extremities- no clubbing, cyanosis, or edema MS- no significant  deformity or atrophy Skin- warm and dry, no rash or lesion, left chest without hematoma/ecchymosis Psych- euthymic mood, full affect Neuro- no gross defecits  Labs:   Lab Results  Component Value Date   WBC 7.3 12/24/2017   HGB 12.2 12/24/2017   HCT 39.3 12/24/2017   MCV 89 12/24/2017   PLT 355 12/24/2017   No results for input(s): NA, K, CL, CO2, BUN, CREATININE, CALCIUM, PROT, BILITOT, ALKPHOS, ALT, AST, GLUCOSE in the last 168 hours.  Invalid input(s): LABALBU  Discharge Medications:  Allergies as of 01/01/2018      Reactions   Gabapentin Swelling   Pregabalin Swelling      Morphine Nausea Only   Penicillins Rash, Other (See Comments)   Has patient had a PCN reaction causing immediate rash, facial/tongue/throat swelling, SOB or lightheadedness with hypotension: No Has patient had a PCN reaction causing severe rash involving mucus membranes or skin necrosis: No Has patient had a PCN reaction that required hospitalization: No - MD office Has patient had a PCN reaction occurring within the last 10 years: No If all of the above answers are "NO", then may proceed with Cephalosporin use.   Tape Rash   Ace Inhibitors Other (See Comments)   Angioedema (ALLERGY/intolerance)   Carvedilol Other (See Comments)   Myalgias (intolerance)   Insulin Glargine Itching   Metoprolol Other (See Comments)   Angioedema (ALLERGY/intolerance)   Procaine Other (See Comments)   unknown   Statins Other (See Comments)   Myalgias (intolerance)   Lisinopril Rash         Medication List    TAKE these medications   ALPRAZolam 0.5 MG tablet Commonly known as:  XANAX Take 0.5 mg by mouth at bedtime.   CENTRUM SILVER PO Take 1 capsule by mouth daily.   [employer]S LEG CRAMPS PAIN RELIEF Tabs Take 1 tablet by mouth daily as needed (for leg cramps).   digoxin 0.125 MG tablet Commonly known as:  LANOXIN Take 0.0625 mg by mouth daily.   ezetimibe 10 MG tablet Commonly known as:  ZETIA Take 10 mg  by mouth daily.   fenofibrate micronized 134 MG capsule Commonly known as:  LOFIBRA Take 134 mg by mouth daily.   fluticasone 50 MCG/ACT nasal spray Commonly known as:  FLONASE Place 2 sprays into both nostrils daily as needed for allergies or rhinitis.   furosemide 40 MG tablet Commonly known as:  LASIX Take 40 mg by mouth 2 (two) times daily.   LEVEMIR FLEXPEN 100 UNIT/ML Pen Generic drug:  Insulin Detemir Inject 30 Units into the skin at bedtime.   levothyroxine 50 MCG tablet Commonly known as:  SYNTHROID, LEVOTHROID Take 50 mcg by mouth daily.   losartan 100 MG tablet Commonly known as:  COZAAR Take 100 mg by mouth daily.   magnesium oxide 400 MG tablet Commonly known as:  MAG-OX Take 400 mg by mouth 2 (two) times daily.   omega-3 acid ethyl esters 1 g capsule Commonly known as:  LOVAZA Take 2 g by mouth 2 (two) times daily.   oxyCODONE-acetaminophen 7.5-325 MG tablet Commonly known as:  PERCOCET Take 1 tablet by mouth every 4 (four) hours as needed for pain.   pantoprazole 40 MG tablet Commonly known as:  PROTONIX Take 40 mg by mouth 2 (two) times daily.   potassium chloride SA 20 MEQ tablet Commonly known as:  K-DUR,KLOR-CON Take 40 mEq by mouth daily.   pravastatin 20 MG tablet Commonly known as:  PRAVACHOL Take 20 mg by mouth at bedtime.   Rivaroxaban 15 MG Tabs tablet Commonly known as:  XARELTO Take 1 tablet (15 mg total) by mouth daily with supper. What changed:  when to take this Notes to patient:  Do not resume until Thursday, 01/03/18   rOPINIRole 1 MG tablet Commonly known as:  REQUIP Take 1 mg by mouth daily.   VISINE OP Place 2 drops into both eyes 2 (two) times daily as needed (for dry eyes).       Disposition:  Home  Discharge Instructions    Diet - low sodium heart healthy   Complete by:  As directed    Increase activity slowly   Complete by:  As directed      Follow-up Information    [facility] Follow  up on 01/10/2018.   Specialty:  Cardiology Why:  2:00PM, wound check visit Contact information: [address] [place]       Evans Lance, MD Follow up on 04/05/2018.   Specialty:  Cardiology Why:  11:45AM Contact information: [address] [place] [address] [phone]           Duration of Discharge Encounter: Greater than 30 minutes including physician time.  Venetia Night, PA-C 01/01/2018 9:17 AM  EP Attending  Patient seen and examined. Agree with above. The patient is doing well today after insertion of a His bundle pacing lead, removal of the old ICD and insertion of a BiV PPM. Her paced QRS is 140 ms, not 160. Her PPM has been interogated under my direction and reprogrammed. Her CXR looks good. She will be discharged home with usual followup. Hold her Eliquis for 2 days.   Mikle Bosworth.D.

## 2017-12-31 NOTE — Discharge Instructions (Signed)
° ° °  Supplemental Discharge Instructions for  Pacemaker/Defibrillator Patients  Activity No heavy lifting or vigorous activity with your left/right arm for 6 to 8 weeks.  Do not raise your left/right arm above your head for one week.  Gradually raise your affected arm as drawn below.              01/04/18                     01/05/18                    01/06/18                  01/07/18 __  NO DRIVING for  1 week   ; you may begin driving on   02/15/92  .  WOUND CARE - Keep the wound area clean and dry.  Do not get this area wet for one week. No showers for one week; you may shower on 01/07/18  . - The tape/steri-strips on your wound will fall off; do not pull them off.  No bandage is needed on the site.  DO  NOT apply any creams, oils, or ointments to the wound area. - If you notice any drainage or discharge from the wound, any swelling or bruising at the site, or you develop a fever > 101? F after you are discharged home, call the office at once.  Special Instructions - You are still able to use cellular telephones; use the ear opposite the side where you have your pacemaker/defibrillator.  Avoid carrying your cellular phone near your device. - When traveling through airports, show security personnel your identification card to avoid being screened in the metal detectors.  Ask the security personnel to use the hand wand. - Avoid arc welding equipment, MRI testing (magnetic resonance imaging), TENS units (transcutaneous nerve stimulators).  Call the office for questions about other devices. - Avoid electrical appliances that are in poor condition or are not properly grounded. - Microwave ovens are safe to be near or to operate.  Additional information for defibrillator patients should your device go off: - If your device goes off ONCE and you feel fine afterward, notify the device clinic nurses. - If your device goes off ONCE and you do not feel well afterward, call 911. - If your device goes off  TWICE, call 911. - If your device goes off THREE times in one day, call 911.  DO NOT DRIVE YOURSELF OR A FAMILY MEMBER WITH A DEFIBRILLATOR TO THE HOSPITAL--CALL 911.

## 2018-01-01 ENCOUNTER — Other Ambulatory Visit: Payer: Self-pay

## 2018-01-01 ENCOUNTER — Observation Stay (HOSPITAL_COMMUNITY): Payer: Medicare HMO

## 2018-01-01 ENCOUNTER — Encounter (HOSPITAL_COMMUNITY): Payer: Self-pay | Admitting: Internal Medicine

## 2018-01-01 DIAGNOSIS — I442 Atrioventricular block, complete: Secondary | ICD-10-CM | POA: Diagnosis not present

## 2018-01-01 DIAGNOSIS — I482 Chronic atrial fibrillation: Secondary | ICD-10-CM | POA: Diagnosis not present

## 2018-01-01 DIAGNOSIS — I509 Heart failure, unspecified: Secondary | ICD-10-CM | POA: Diagnosis not present

## 2018-01-01 DIAGNOSIS — Z4502 Encounter for adjustment and management of automatic implantable cardiac defibrillator: Secondary | ICD-10-CM | POA: Diagnosis not present

## 2018-01-01 DIAGNOSIS — I420 Dilated cardiomyopathy: Secondary | ICD-10-CM | POA: Diagnosis not present

## 2018-01-01 DIAGNOSIS — Z880 Allergy status to penicillin: Secondary | ICD-10-CM | POA: Diagnosis not present

## 2018-01-01 DIAGNOSIS — I447 Left bundle-branch block, unspecified: Secondary | ICD-10-CM | POA: Diagnosis not present

## 2018-01-01 DIAGNOSIS — Z7901 Long term (current) use of anticoagulants: Secondary | ICD-10-CM | POA: Diagnosis not present

## 2018-01-01 DIAGNOSIS — Z006 Encounter for examination for normal comparison and control in clinical research program: Secondary | ICD-10-CM | POA: Diagnosis not present

## 2018-01-01 DIAGNOSIS — E1151 Type 2 diabetes mellitus with diabetic peripheral angiopathy without gangrene: Secondary | ICD-10-CM | POA: Diagnosis not present

## 2018-01-01 DIAGNOSIS — Z885 Allergy status to narcotic agent status: Secondary | ICD-10-CM | POA: Diagnosis not present

## 2018-01-01 NOTE — Progress Notes (Signed)
Discharge instructions reviewed with patient. IV and telemetry removed. Belongings packed. Son at bedside to take her home.

## 2018-01-01 NOTE — Progress Notes (Signed)
Visited with patient.  She talked about her son who is currently a patient.  She is looking forward to seeing her son.  Patient request prayer.  We prayed together and she continued to talk about and express her faith.    01/01/18 1224  Clinical Encounter Type  Visited With Patient  Visit Type Initial;Spiritual support  Spiritual Encounters  Spiritual Needs Prayer

## 2018-01-10 ENCOUNTER — Ambulatory Visit (INDEPENDENT_AMBULATORY_CARE_PROVIDER_SITE_OTHER): Payer: Medicare HMO | Admitting: *Deleted

## 2018-01-10 DIAGNOSIS — I442 Atrioventricular block, complete: Secondary | ICD-10-CM

## 2018-01-10 LAB — CUP PACEART INCLINIC DEVICE CHECK
Battery Remaining Longevity: 144 mo
Battery Voltage: 3.19 V
Brady Statistic AP VP Percent: 0.15 %
Brady Statistic AP VS Percent: 99.01 %
Brady Statistic AS VP Percent: 0 %
Brady Statistic AS VS Percent: 0.84 %
Brady Statistic RA Percent Paced: 99.08 %
Brady Statistic RV Percent Paced: 0.15 %
Date Time Interrogation Session: 20190404154552
Implantable Lead Implant Date: 20080908
Implantable Lead Implant Date: 20080908
Implantable Lead Implant Date: 20190326
Implantable Lead Location: 753858
Implantable Lead Location: 753860
Implantable Lead Location: 753860
Implantable Lead Model: 3830
Implantable Lead Model: 4194
Implantable Lead Model: 7121
Implantable Pulse Generator Implant Date: 20190326
Lead Channel Impedance Value: 285 Ohm
Lead Channel Impedance Value: 342 Ohm
Lead Channel Impedance Value: 399 Ohm
Lead Channel Impedance Value: 418 Ohm
Lead Channel Impedance Value: 494 Ohm
Lead Channel Impedance Value: 532 Ohm
Lead Channel Impedance Value: 627 Ohm
Lead Channel Impedance Value: 684 Ohm
Lead Channel Impedance Value: 760 Ohm
Lead Channel Pacing Threshold Amplitude: 0.5 V
Lead Channel Pacing Threshold Amplitude: 0.75 V
Lead Channel Pacing Threshold Amplitude: 3.75 V
Lead Channel Pacing Threshold Pulse Width: 0.4 ms
Lead Channel Pacing Threshold Pulse Width: 0.4 ms
Lead Channel Pacing Threshold Pulse Width: 1 ms
Lead Channel Sensing Intrinsic Amplitude: 11.125 mV
Lead Channel Sensing Intrinsic Amplitude: 6.25 mV
Lead Channel Sensing Intrinsic Amplitude: 6.5 mV
Lead Channel Sensing Intrinsic Amplitude: 8.375 mV
Lead Channel Setting Pacing Amplitude: 2.5 V
Lead Channel Setting Pacing Amplitude: 3.5 V
Lead Channel Setting Pacing Pulse Width: 0.4 ms
Lead Channel Setting Sensing Sensitivity: 1.2 mV

## 2018-01-10 NOTE — Progress Notes (Signed)
Wound check appointment. Steri-strips removed. Wound without redness or edema. Incision edges approximated, wound well healed. Normal device function. Thresholds, sensing, and impedances consistent with implant measurements. Device programmed at 3.5V for new RA- HIS and chronic values for RV. Histogram distribution appropriate for patient and level of activity. No mode switches or high ventricular rates noted. Patient educated about wound care, arm mobility, lifting restrictions. ROV with GT 6/28.

## 2018-03-25 DIAGNOSIS — E782 Mixed hyperlipidemia: Secondary | ICD-10-CM | POA: Diagnosis not present

## 2018-03-25 DIAGNOSIS — E1143 Type 2 diabetes mellitus with diabetic autonomic (poly)neuropathy: Secondary | ICD-10-CM | POA: Diagnosis not present

## 2018-03-25 DIAGNOSIS — M25511 Pain in right shoulder: Secondary | ICD-10-CM | POA: Diagnosis not present

## 2018-03-25 DIAGNOSIS — M85811 Other specified disorders of bone density and structure, right shoulder: Secondary | ICD-10-CM | POA: Diagnosis not present

## 2018-03-25 DIAGNOSIS — I110 Hypertensive heart disease with heart failure: Secondary | ICD-10-CM | POA: Diagnosis not present

## 2018-03-25 DIAGNOSIS — E663 Overweight: Secondary | ICD-10-CM | POA: Diagnosis not present

## 2018-03-25 DIAGNOSIS — N184 Chronic kidney disease, stage 4 (severe): Secondary | ICD-10-CM | POA: Diagnosis not present

## 2018-03-25 DIAGNOSIS — Z6829 Body mass index (BMI) 29.0-29.9, adult: Secondary | ICD-10-CM | POA: Diagnosis not present

## 2018-03-25 DIAGNOSIS — I481 Persistent atrial fibrillation: Secondary | ICD-10-CM | POA: Diagnosis not present

## 2018-03-25 DIAGNOSIS — E038 Other specified hypothyroidism: Secondary | ICD-10-CM | POA: Diagnosis not present

## 2018-03-28 DIAGNOSIS — M7541 Impingement syndrome of right shoulder: Secondary | ICD-10-CM | POA: Diagnosis not present

## 2018-04-05 ENCOUNTER — Ambulatory Visit: Payer: Medicare HMO | Admitting: Internal Medicine

## 2018-04-05 ENCOUNTER — Encounter: Payer: Self-pay | Admitting: Internal Medicine

## 2018-04-05 VITALS — BP 142/76 | HR 84 | Ht 68.0 in | Wt 165.0 lb

## 2018-04-05 DIAGNOSIS — Z95810 Presence of automatic (implantable) cardiac defibrillator: Secondary | ICD-10-CM | POA: Diagnosis not present

## 2018-04-05 DIAGNOSIS — I5022 Chronic systolic (congestive) heart failure: Secondary | ICD-10-CM | POA: Diagnosis not present

## 2018-04-05 DIAGNOSIS — I482 Chronic atrial fibrillation, unspecified: Secondary | ICD-10-CM

## 2018-04-05 NOTE — Patient Instructions (Addendum)
Medication Instructions:  Your physician recommends that you continue on your current medications as directed. Please refer to the Current Medication list given to you today.  Labwork: None ordered.  Testing/Procedures: None ordered.  Follow-Up: Your physician wants you to follow-up in: 9 months with Dr. Lovena Le.   You will receive a reminder letter in the mail two months in advance. If you don't receive a letter, please call our office to schedule the follow-up appointment.  Remote monitoring is used to monitor your ICD from home. This monitoring reduces the number of office visits required to check your device to one time per year. It allows [name] to keep an eye on the functioning of your device to ensure it is working properly. You are scheduled for a device check from home on 07/08/2018. You may send your transmission at any time that day. If you have a wireless device, the transmission will be sent automatically. After your physician reviews your transmission, you will receive a postcard with your next transmission date.  Any Other Special Instructions Will Be Listed Below (If Applicable).  If you need a refill on your cardiac medications before your next appointment, please call your pharmacy.

## 2018-04-05 NOTE — Progress Notes (Signed)
HPI [name] returns today for followup of her PPM. She is a pleasant 84 yo woman with chronic atrial fib, s/p remote AV node ablation, who underwent recent PPM gen change where she had a his bundle lead placed after her LV lead threshold had increased significantly. She has done well in the interim with no chest pain, sob, or edema.  Allergies  Allergen Reactions  . Gabapentin Swelling  . Pregabalin Swelling       . Morphine Nausea Only  . Penicillins Rash and Other (See Comments)    Has patient had a PCN reaction causing immediate rash, facial/tongue/throat swelling, SOB or lightheadedness with hypotension: No Has patient had a PCN reaction causing severe rash involving mucus membranes or skin necrosis: No Has patient had a PCN reaction that required hospitalization: No - MD office Has patient had a PCN reaction occurring within the last 10 years: No If all of the above answers are "NO", then may proceed with Cephalosporin use.   . Tape Rash  . Ace Inhibitors Other (See Comments)    Angioedema (ALLERGY/intolerance)  . Carvedilol Other (See Comments)    Myalgias (intolerance)  . Insulin Glargine Itching  . Metoprolol Other (See Comments)    Angioedema (ALLERGY/intolerance)  . Procaine Other (See Comments)    unknown  . Statins Other (See Comments)    Myalgias (intolerance)  . Lisinopril Rash          Current Outpatient Medications  Medication Sig Dispense Refill  . ALPRAZolam (XANAX) 0.5 MG tablet Take 0.5 mg by mouth at bedtime.     Marland Kitchen amLODipine (NORVASC) 5 MG tablet Take 1 tablet by mouth daily.    . Coenzyme Q10 200 MG capsule Take 200 mg by mouth daily.    . digoxin (LANOXIN) 0.125 MG tablet Take 0.0625 mg by mouth daily.     Marland Kitchen ezetimibe (ZETIA) 10 MG tablet Take 10 mg by mouth daily.    . fenofibrate micronized (LOFIBRA) 134 MG capsule Take 134 mg by mouth daily.     . fluticasone (FLONASE) 50 MCG/ACT nasal spray Place 2 sprays into both nostrils daily as  needed for allergies or rhinitis.     . furosemide (LASIX) 40 MG tablet Take 40 mg by mouth 2 (two) times daily.     . Insulin Detemir (LEVEMIR FLEXPEN) 100 UNIT/ML Pen Inject 30 Units into the skin at bedtime.    Marland Kitchen levothyroxine (SYNTHROID, LEVOTHROID) 50 MCG tablet Take 50 mcg by mouth daily.    Marland Kitchen losartan (COZAAR) 100 MG tablet Take 100 mg by mouth daily.     . magnesium oxide (MAG-OX) 400 MG tablet Take 400 mg by mouth 2 (two) times daily.    . Multiple Vitamins-Minerals (CENTRUM SILVER PO) Take 1 capsule by mouth daily.    Marland Kitchen omega-3 acid ethyl esters (LOVAZA) 1 G capsule Take 2 g by mouth 2 (two) times daily.     Marland Kitchen oxyCODONE-acetaminophen (PERCOCET) 7.5-325 MG per tablet Take 1 tablet by mouth every 4 (four) hours as needed for pain.    . pantoprazole (PROTONIX) 40 MG tablet Take 40 mg by mouth 2 (two) times daily.    . potassium chloride SA (K-DUR,KLOR-CON) 20 MEQ tablet Take 40 mEq by mouth daily.     . pravastatin (PRAVACHOL) 20 MG tablet Take 20 mg by mouth at bedtime.    . Rivaroxaban (XARELTO) 15 MG TABS tablet Take 1 tablet (15 mg total) by mouth daily with supper. (Patient  taking differently: Take 15 mg by mouth at bedtime. ) 30 tablet 11  . rOPINIRole (REQUIP) 1 MG tablet Take 1 mg by mouth daily.     . Tetrahydrozoline HCl (VISINE OP) Place 2 drops into both eyes 2 (two) times daily as needed (for dry eyes).    Marland Kitchen tiZANidine (ZANAFLEX) 4 MG tablet Take 1 tablet by mouth daily.     No current facility-administered medications for this visit.      Past Medical History:  Diagnosis Date  . Amiodarone pulmonary toxicity   . Anemia    no GI bleeding; on iron  . Atrial fibrillation ([place])   . Biventricular ICD (implantable cardiac defibrillator) in place    BiV ICD for nonischemic cardiomyopathy; BiV responder  . Carotid artery occlusion   . CHF (congestive heart failure) ([place])   . Chronic a-fib ([place])    on xarelto; failed DCCV  . Diabetes ([place])   . H/O cardiovascular stress  test 08/04/2010   normal imaging; EF 61%  . H/O echocardiogram 07/05/2010   EF 45-50%; aortic valve-mildly sclerotic; LA-mod dilaterd   . Hyperlipemia   . 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 LBBB (left bundle branch block)   . PVD (peripheral vascular disease) ([place])    L RA stent 1994  . RAS (renal artery stenosis) ([place])    a. renal dopp (12/18/08)- Right RA-<60%; L RA stent- open; nl size and shape in both kidneys;  b.  Rena Artery [name] (3/16):  SMA with > 70% stenosis, Bilateral prox RA with 1-59%  . Renal insufficiency, mild    05/01/12 Creat 1.47  . VT (ventricular tachycardia) ([place]) 9/11   polymorphic VT probably related to sotalol therapy    ROS:   All systems reviewed and negative except as noted in the HPI.   Past Surgical History:  Procedure Laterality Date  . AV NODE ABLATION  06/08/2006   performed by Dr. Beckie Salts; due to Afib with RVR and tachy brady syndrome  . BIV ICD GENERATOR CHANGEOUT N/A 12/31/2017   Procedure: BIV ICD GENERATOR CHANGEOUT;  Surgeon: Evans Lance, MD;  Location: [place] [facility];  Service: Cardiovascular;  Laterality: N/A;  . BIV ICD GENERTAOR CHANGE OUT  11/15/10   Medtronic Protecta [id number] serial #[id number]  . BiV ICD Placement  06/17/2007; 04/20/14   Medtronic Concerta [id number]; RA lead-Med 5076/53cm, [id number]; RV lead- SJM 7121/65cm, [id number]; LV lead- Med 4194/88cm, [id number] V; gen change 04/2014 by Dr Lovena Le  . BIV PACEMAKER GENERATOR CHANGE OUT N/A 04/20/2014   Procedure: BIV PACEMAKER GENERATOR CHANGE OUT;  Surgeon: Evans Lance, MD;  Location: [facility] CATH LAB;  Service: Cardiovascular;  Laterality: N/A;  . BRAIN MENINGIOMA EXCISION  2011   L [facility] at [facility]   . CARDIOVERSION  07/21/05   converted to sinus brady  . CHOLECYSTECTOMY     Dr. Sherald Hess in [place] and Dr. Lyda Jester follows; surgical stricture post procedure with stent placement  . PV Angio  1994   L Renal artery stent      Family  History  Problem Relation Age of Onset  . Heart failure Mother   . Dementia Sister   . Heart attack Brother   . AAA (abdominal aortic aneurysm) Brother   . Stroke Maternal Grandmother      Social History   Socioeconomic History  . Marital status: Married    Spouse name: Not on file  . Number of children: Not on file  .  Years of education: Not on file  . Highest education level: Not on file  Occupational History  . Not on file  Social Needs  . Financial resource strain: Not hard at all  . Food insecurity:    Worry: Patient refused    Inability: Patient refused  . Transportation needs:    Medical: Patient refused    Non-medical: Patient refused  Tobacco Use  . Smoking status: Never Smoker  . Smokeless tobacco: Never Used  Substance and Sexual Activity  . Alcohol use: No    Alcohol/week: 0.0 oz  . Drug use: No  . Sexual activity: Not Currently  Lifestyle  . Physical activity:    Days per week: 2 days    Minutes per session: 20 min  . Stress: Only a little  Relationships  . Social connections:    Talks on phone: Patient refused    Gets together: Patient refused    Attends religious service: Patient refused    Active member of club or organization: Patient refused    Attends meetings of clubs or organizations: Patient refused    Relationship status: Patient refused  . Intimate partner violence:    Fear of current or ex partner: Patient refused    Emotionally abused: Patient refused    Physically abused: Patient refused    Forced sexual activity: Patient refused  Other Topics Concern  . Not on file  Social History Narrative  . Not on file     BP (!) 142/76   Pulse 84   Ht 5\' 8"  (1.727 m)   Wt 165 lb (74.8 kg)   SpO2 95%   BMI 25.09 kg/m   Physical Exam:  Well appearing 84 yo woman, NAD HEENT: Unremarkable Neck:  No JVD, no thyromegally Lymphatics:  No adenopathy Back:  No CVA tenderness Lungs:  Clear with no wheezes, and well healed PPM  incision. HEART:  Regular rate rhythm, no murmurs, no rubs, no clicks Abd:  soft, positive bowel sounds, no organomegally, no rebound, no guarding Ext:  2 plus pulses, no edema, no cyanosis, no clubbing Skin:  No rashes no nodules Neuro:  CN II through XII intact, motor grossly intact  EKG - atrial fib with ventricular pacing  DEVICE  Normal device function.  See PaceArt for details.   Assess/Plan: 1. CHB - she is asymptomatic, s/p PPM insertion 2. Chronic systolic/diastolic heart failure - her symptoms remain class 2. She will continue her current meds. 3. HTN - her blood pressure is better but still a little high. No change in meds for now. She is encouraged to maintain a low sodium diet. 4. PPM - her Medtronic BiV PPM is working normally. She will continue with his bundle pacing which is non-selective.  Mikle Bosworth.D.

## 2018-04-12 DIAGNOSIS — M659 Synovitis and tenosynovitis, unspecified: Secondary | ICD-10-CM | POA: Insufficient documentation

## 2018-04-12 DIAGNOSIS — M65972 Unspecified synovitis and tenosynovitis, left ankle and foot: Secondary | ICD-10-CM | POA: Insufficient documentation

## 2018-04-12 DIAGNOSIS — M10072 Idiopathic gout, left ankle and foot: Secondary | ICD-10-CM | POA: Diagnosis not present

## 2018-04-12 DIAGNOSIS — M25572 Pain in left ankle and joints of left foot: Secondary | ICD-10-CM | POA: Diagnosis not present

## 2018-04-15 DIAGNOSIS — M10072 Idiopathic gout, left ankle and foot: Secondary | ICD-10-CM | POA: Diagnosis not present

## 2018-04-18 DIAGNOSIS — E119 Type 2 diabetes mellitus without complications: Secondary | ICD-10-CM | POA: Diagnosis not present

## 2018-04-18 DIAGNOSIS — M10072 Idiopathic gout, left ankle and foot: Secondary | ICD-10-CM | POA: Diagnosis not present

## 2018-05-06 DIAGNOSIS — E1143 Type 2 diabetes mellitus with diabetic autonomic (poly)neuropathy: Secondary | ICD-10-CM | POA: Diagnosis not present

## 2018-05-16 ENCOUNTER — Encounter (HOSPITAL_COMMUNITY): Payer: Medicare HMO

## 2018-05-16 ENCOUNTER — Ambulatory Visit: Payer: Medicare HMO | Admitting: Family

## 2018-06-21 LAB — CUP PACEART INCLINIC DEVICE CHECK
Battery Remaining Longevity: 137 mo
Battery Voltage: 3.09 V
Brady Statistic AP VP Percent: 0.1 %
Brady Statistic AP VS Percent: 99.21 %
Brady Statistic AS VP Percent: 0 %
Brady Statistic AS VS Percent: 0.69 %
Brady Statistic RA Percent Paced: 99.26 %
Brady Statistic RV Percent Paced: 0.1 %
Date Time Interrogation Session: 20190628154659
Implantable Lead Implant Date: 20080908
Implantable Lead Implant Date: 20080908
Implantable Lead Implant Date: 20190326
Implantable Lead Location: 753858
Implantable Lead Location: 753860
Implantable Lead Location: 753860
Implantable Lead Model: 3830
Implantable Lead Model: 4194
Implantable Lead Model: 7121
Implantable Pulse Generator Implant Date: 20190326
Lead Channel Impedance Value: 323 Ohm
Lead Channel Impedance Value: 342 Ohm
Lead Channel Impedance Value: 342 Ohm
Lead Channel Impedance Value: 456 Ohm
Lead Channel Impedance Value: 475 Ohm
Lead Channel Impedance Value: 513 Ohm
Lead Channel Impedance Value: 532 Ohm
Lead Channel Impedance Value: 646 Ohm
Lead Channel Impedance Value: 703 Ohm
Lead Channel Sensing Intrinsic Amplitude: 6.625 mV
Lead Channel Sensing Intrinsic Amplitude: 6.75 mV
Lead Channel Sensing Intrinsic Amplitude: 9.125 mV
Lead Channel Sensing Intrinsic Amplitude: 9.25 mV
Lead Channel Setting Pacing Amplitude: 2 V
Lead Channel Setting Pacing Amplitude: 2.5 V
Lead Channel Setting Pacing Pulse Width: 0.4 ms
Lead Channel Setting Sensing Sensitivity: 1.2 mV

## 2018-06-27 DIAGNOSIS — E1169 Type 2 diabetes mellitus with other specified complication: Secondary | ICD-10-CM | POA: Diagnosis not present

## 2018-06-27 DIAGNOSIS — M25511 Pain in right shoulder: Secondary | ICD-10-CM | POA: Diagnosis not present

## 2018-06-27 DIAGNOSIS — E782 Mixed hyperlipidemia: Secondary | ICD-10-CM | POA: Diagnosis not present

## 2018-06-27 DIAGNOSIS — Z6829 Body mass index (BMI) 29.0-29.9, adult: Secondary | ICD-10-CM | POA: Diagnosis not present

## 2018-06-27 DIAGNOSIS — I110 Hypertensive heart disease with heart failure: Secondary | ICD-10-CM | POA: Diagnosis not present

## 2018-06-27 DIAGNOSIS — E038 Other specified hypothyroidism: Secondary | ICD-10-CM | POA: Diagnosis not present

## 2018-06-27 DIAGNOSIS — I481 Persistent atrial fibrillation: Secondary | ICD-10-CM | POA: Diagnosis not present

## 2018-06-27 DIAGNOSIS — E1143 Type 2 diabetes mellitus with diabetic autonomic (poly)neuropathy: Secondary | ICD-10-CM | POA: Diagnosis not present

## 2018-06-27 DIAGNOSIS — E663 Overweight: Secondary | ICD-10-CM | POA: Diagnosis not present

## 2018-06-27 DIAGNOSIS — I10 Essential (primary) hypertension: Secondary | ICD-10-CM | POA: Diagnosis not present

## 2018-06-27 DIAGNOSIS — N184 Chronic kidney disease, stage 4 (severe): Secondary | ICD-10-CM | POA: Diagnosis not present

## 2018-06-28 ENCOUNTER — Encounter (HOSPITAL_COMMUNITY): Payer: Medicare HMO

## 2018-06-28 ENCOUNTER — Ambulatory Visit: Payer: Medicare HMO | Admitting: Family

## 2018-07-08 ENCOUNTER — Encounter (HOSPITAL_COMMUNITY): Payer: Medicare HMO

## 2018-07-08 ENCOUNTER — Ambulatory Visit (INDEPENDENT_AMBULATORY_CARE_PROVIDER_SITE_OTHER): Payer: Medicare HMO | Admitting: *Deleted

## 2018-07-08 ENCOUNTER — Ambulatory Visit: Payer: Medicare HMO | Admitting: Family

## 2018-07-08 ENCOUNTER — Telehealth: Payer: Self-pay | Admitting: Cardiology

## 2018-07-08 DIAGNOSIS — I442 Atrioventricular block, complete: Secondary | ICD-10-CM | POA: Diagnosis not present

## 2018-07-08 NOTE — Telephone Encounter (Signed)
LMOVM reminding pt to send remote transmission.   

## 2018-07-09 NOTE — Progress Notes (Signed)
Remote pacemaker transmission.   

## 2018-07-12 LAB — CUP PACEART REMOTE DEVICE CHECK
Battery Remaining Longevity: 122 mo
Battery Voltage: 3.05 V
Brady Statistic AP VP Percent: 0.11 %
Brady Statistic AP VS Percent: 99 %
Brady Statistic AS VP Percent: 0 %
Brady Statistic AS VS Percent: 0.89 %
Brady Statistic RA Percent Paced: 99.08 %
Brady Statistic RV Percent Paced: 0.11 %
Date Time Interrogation Session: 20191001121040
Implantable Lead Implant Date: 20080908
Implantable Lead Implant Date: 20080908
Implantable Lead Implant Date: 20190326
Implantable Lead Location: 753858
Implantable Lead Location: 753860
Implantable Lead Location: 753860
Implantable Lead Model: 3830
Implantable Lead Model: 4194
Implantable Lead Model: 7121
Implantable Pulse Generator Implant Date: 20190326
Lead Channel Impedance Value: 323 Ohm
Lead Channel Impedance Value: 342 Ohm
Lead Channel Impedance Value: 342 Ohm
Lead Channel Impedance Value: 456 Ohm
Lead Channel Impedance Value: 475 Ohm
Lead Channel Impedance Value: 513 Ohm
Lead Channel Impedance Value: 532 Ohm
Lead Channel Impedance Value: 646 Ohm
Lead Channel Impedance Value: 684 Ohm
Lead Channel Sensing Intrinsic Amplitude: 8.25 mV
Lead Channel Sensing Intrinsic Amplitude: 8.25 mV
Lead Channel Sensing Intrinsic Amplitude: 9.25 mV
Lead Channel Sensing Intrinsic Amplitude: 9.25 mV
Lead Channel Setting Pacing Amplitude: 2 V
Lead Channel Setting Pacing Amplitude: 2.5 V
Lead Channel Setting Pacing Pulse Width: 0.4 ms
Lead Channel Setting Sensing Sensitivity: 1.2 mV

## 2018-07-18 DIAGNOSIS — M10072 Idiopathic gout, left ankle and foot: Secondary | ICD-10-CM | POA: Diagnosis not present

## 2018-07-18 DIAGNOSIS — M76822 Posterior tibial tendinitis, left leg: Secondary | ICD-10-CM | POA: Diagnosis not present

## 2018-07-18 DIAGNOSIS — M6702 Short Achilles tendon (acquired), left ankle: Secondary | ICD-10-CM | POA: Diagnosis not present

## 2018-07-18 DIAGNOSIS — E119 Type 2 diabetes mellitus without complications: Secondary | ICD-10-CM | POA: Diagnosis not present

## 2018-07-23 DIAGNOSIS — L219 Seborrheic dermatitis, unspecified: Secondary | ICD-10-CM | POA: Diagnosis not present

## 2018-07-24 DIAGNOSIS — Z23 Encounter for immunization: Secondary | ICD-10-CM | POA: Diagnosis not present

## 2018-07-31 DIAGNOSIS — H524 Presbyopia: Secondary | ICD-10-CM | POA: Diagnosis not present

## 2018-07-31 DIAGNOSIS — E119 Type 2 diabetes mellitus without complications: Secondary | ICD-10-CM | POA: Diagnosis not present

## 2018-08-05 ENCOUNTER — Encounter: Payer: Self-pay | Admitting: Family

## 2018-08-05 ENCOUNTER — Ambulatory Visit (HOSPITAL_COMMUNITY)
Admission: RE | Admit: 2018-08-05 | Discharge: 2018-08-05 | Disposition: A | Discharge status: Home / Self Care | Payer: Medicare HMO | Source: Ambulatory Visit | Attending: Vascular Surgery | Admitting: Vascular Surgery

## 2018-08-05 ENCOUNTER — Ambulatory Visit: Payer: Medicare HMO | Admitting: Family

## 2018-08-05 VITALS — BP 170/82 | HR 77 | Temp 97.0°F | Resp 19 | Ht 68.0 in | Wt 182.4 lb

## 2018-08-05 DIAGNOSIS — I6529 Occlusion and stenosis of unspecified carotid artery: Secondary | ICD-10-CM | POA: Diagnosis not present

## 2018-08-05 DIAGNOSIS — I6521 Occlusion and stenosis of right carotid artery: Secondary | ICD-10-CM | POA: Diagnosis not present

## 2018-08-05 DIAGNOSIS — R221 Localized swelling, mass and lump, neck: Secondary | ICD-10-CM

## 2018-08-05 DIAGNOSIS — N184 Chronic kidney disease, stage 4 (severe): Secondary | ICD-10-CM

## 2018-08-05 DIAGNOSIS — M7989 Other specified soft tissue disorders: Secondary | ICD-10-CM

## 2018-08-05 NOTE — Patient Instructions (Signed)

## 2018-08-05 NOTE — Progress Notes (Signed)
Chief Complaint: Follow up Extracranial Carotid Artery Stenosis   History of Present Illness  [name] is a 84 y.o. female referred for evaluation of an asymptomatic carotid stenosis.  The patient did have what was thought to be a stroke about 2009.  It primarily affected her right hand.  She has not had any further events.    She also has a history of atrial fibrillation and is on Xarelto for this.  She is also on a statin.  She also has an AICD.   She denies any symptoms of TIA, amaurosis, or stroke.   Other chronic medical problems include chronic back pain for which she takes Percocet about 2-3 tablets/day.  She also has a history of a left renal stent in 1994.    She has not had previous carotid artery intervention.  Dr. Oneida Alar last evaluated pt on 11-15-17. At that time patient was asymptomatic with a right internal carotid artery stenosis of 60-80%.  In light of her age lack of symptoms and lack of progression to less than 80% stenosis, Dr. Oneida Alar believed the best management at that time would be continued observation and continuing her Xarelto and her statin.  If she developed any symptoms of TIA amaurosis or stroke we would do further evaluation.  Otherwise she was to return in 6 months with a repeat carotid duplex and see our nurse practitioner.  Serum creatinine was 1.65, GFR was 29 on 12-24-17, stage 4 CKD. She is followed by Dr. Arty Baumgartner.   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   She had a pacemaker/ICD inserted in March 2019.   She has arthritis in her hips, walks with a cane.   Diabetic: yes, states her her FBS runs 70-140, does not know her A1C, no results on file dating back to 2015.  Tobacco use: non-smoker  Pt meds include: Statin : yes ASA: no Other anticoagulants/antiplatelets: Xarelto, has a hx of atrial fib.    Past Medical History:  Diagnosis Date  . Amiodarone pulmonary toxicity   . Anemia    no GI bleeding; on iron  . Atrial fibrillation  ([place])   . Biventricular ICD (implantable cardiac defibrillator) in place    BiV ICD for nonischemic cardiomyopathy; BiV responder  . Carotid artery occlusion   . CHF (congestive heart failure) ([place])   . Chronic a-fib    on xarelto; failed DCCV  . Diabetes ([place])   . H/O cardiovascular stress test 08/04/2010   normal imaging; EF 61%  . H/O echocardiogram 07/05/2010   EF 45-50%; aortic valve-mildly sclerotic; LA-mod dilaterd   . Hyperlipemia   . 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 LBBB (left bundle branch block)   . PVD (peripheral vascular disease) ([place])    L RA stent 1994  . RAS (renal artery stenosis) ([place])    a. renal dopp (12/18/08)- Right RA-<60%; L RA stent- open; nl size and shape in both kidneys;  b.  Rena Artery [name] (3/16):  SMA with > 70% stenosis, Bilateral prox RA with 1-59%  . Renal insufficiency, mild    05/01/12 Creat 1.47  . VT (ventricular tachycardia) ([place]) 9/11   polymorphic VT probably related to sotalol therapy    Social History Social History   Tobacco Use  . Smoking status: Never Smoker  . Smokeless tobacco: Never Used  Substance Use Topics  . Alcohol use: No    Alcohol/week: 0.0 standard drinks  . Drug use: No  Family History Family History  Problem Relation Age of Onset  . Heart failure Mother   . Dementia Sister   . Heart attack Brother   . AAA (abdominal aortic aneurysm) Brother   . Stroke Maternal Grandmother     Surgical History Past Surgical History:  Procedure Laterality Date  . AV NODE ABLATION  06/08/2006   performed by Dr. Beckie Salts; due to Afib with RVR and tachy brady syndrome  . BIV ICD GENERATOR CHANGEOUT N/A 12/31/2017   Procedure: BIV ICD GENERATOR CHANGEOUT;  Surgeon: Evans Lance, MD;  Location: [place] [facility];  Service: Cardiovascular;  Laterality: N/A;  . BIV ICD GENERTAOR CHANGE OUT  11/15/10   Medtronic Protecta [id number] serial #[id number]  . BiV ICD Placement  06/17/2007; 04/20/14   Medtronic  Concerta [id number]; RA lead-Med 5076/53cm, [id number]; RV lead- SJM 7121/65cm, [id number]; LV lead- Med 4194/88cm, [id number]; gen change 04/2014 by Dr Lovena Le  . BIV PACEMAKER GENERATOR CHANGE OUT N/A 04/20/2014   Procedure: BIV PACEMAKER GENERATOR CHANGE OUT;  Surgeon: Evans Lance, MD;  Location: [facility] CATH LAB;  Service: Cardiovascular;  Laterality: N/A;  . BRAIN MENINGIOMA EXCISION  2011   L [facility] at [facility]   . CARDIOVERSION  07/21/05   converted to sinus brady  . CHOLECYSTECTOMY     Dr. Sherald Hess in [place] and Dr. Lyda Jester follows; surgical stricture post procedure with stent placement  . PV Angio  1994   L Renal artery stent     Allergies  Allergen Reactions  . Gabapentin Swelling  . Pregabalin Swelling       . Morphine Nausea Only  . Penicillins Rash and Other (See Comments)    Has patient had a PCN reaction causing immediate rash, facial/tongue/throat swelling, SOB or lightheadedness with hypotension: No Has patient had a PCN reaction causing severe rash involving mucus membranes or skin necrosis: No Has patient had a PCN reaction that required hospitalization: No - MD office Has patient had a PCN reaction occurring within the last 10 years: No If all of the above answers are "NO", then may proceed with Cephalosporin use.   . Tape Rash  . Ace Inhibitors Other (See Comments)    Angioedema (ALLERGY/intolerance)  . Carvedilol Other (See Comments)    Myalgias (intolerance)  . Insulin Glargine Itching  . Metoprolol Other (See Comments)    Angioedema (ALLERGY/intolerance)  . Procaine Other (See Comments)    unknown  . Statins Other (See Comments)    Myalgias (intolerance)  . Lisinopril Rash         Current Outpatient Medications  Medication Sig Dispense Refill  . allopurinol (ZYLOPRIM) 100 MG tablet TAKE ONE (1) TABLET BY MOUTH ONCE DAILY    . ALPRAZolam (XANAX) 0.5 MG tablet Take 0.5 mg by mouth at bedtime.     Marland Kitchen amLODipine (NORVASC) 5 MG tablet Take 1  tablet by mouth daily.    . Coenzyme Q10 200 MG capsule Take 200 mg by mouth daily.    . digoxin (LANOXIN) 0.125 MG tablet Take 0.0625 mg by mouth daily.     Marland Kitchen ezetimibe (ZETIA) 10 MG tablet Take 10 mg by mouth daily.    . fenofibrate micronized (LOFIBRA) 134 MG capsule Take 134 mg by mouth daily.     . fluticasone (FLONASE) 50 MCG/ACT nasal spray Place 2 sprays into both nostrils daily as needed for allergies or rhinitis.     . furosemide (LASIX) 40 MG tablet Take 40 mg by mouth 2 (two) times  daily.     . Insulin Detemir (LEVEMIR FLEXPEN) 100 UNIT/ML Pen Inject 30 Units into the skin at bedtime.    Marland Kitchen levothyroxine (SYNTHROID, LEVOTHROID) 50 MCG tablet Take 50 mcg by mouth daily.    Marland Kitchen losartan (COZAAR) 100 MG tablet Take 100 mg by mouth daily.     . magnesium oxide (MAG-OX) 400 MG tablet Take 400 mg by mouth 2 (two) times daily.    . Multiple Vitamins-Minerals (CENTRUM SILVER PO) Take 1 capsule by mouth daily.    Marland Kitchen omega-3 acid ethyl esters (LOVAZA) 1 G capsule Take 2 g by mouth 2 (two) times daily.     Marland Kitchen oxyCODONE-acetaminophen (PERCOCET) 7.5-325 MG per tablet Take 1 tablet by mouth every 4 (four) hours as needed for pain.    . pantoprazole (PROTONIX) 40 MG tablet Take 40 mg by mouth 2 (two) times daily.    . potassium chloride SA (K-DUR,KLOR-CON) 20 MEQ tablet Take 40 mEq by mouth daily.     . pravastatin (PRAVACHOL) 20 MG tablet Take 20 mg by mouth at bedtime.    . Rivaroxaban (XARELTO) 15 MG TABS tablet Take 1 tablet (15 mg total) by mouth daily with supper. (Patient taking differently: Take 15 mg by mouth at bedtime. ) 30 tablet 11  . rOPINIRole (REQUIP) 1 MG tablet Take 1 mg by mouth daily.     . Tetrahydrozoline HCl (VISINE OP) Place 2 drops into both eyes 2 (two) times daily as needed (for dry eyes).    Marland Kitchen tiZANidine (ZANAFLEX) 4 MG tablet Take 1 tablet by mouth daily.     No current facility-administered medications for this visit.     Review of Systems : See HPI for pertinent  positives and negatives.  Physical Examination  Vitals:   08/05/18 1238 08/05/18 1242  BP: (!) 158/70 (!) 170/82  Pulse: 82 77  Resp: 19   Temp: (!) 97 F (36.1 C)   TempSrc: Oral   SpO2: 100%   Weight: 182 lb 6.4 oz (82.7 kg)   Height: 5\' 8"  (1.727 m)    Body mass index is 27.73 kg/m.  General: WDWN female in NAD GAIT Eyes: PERRLA HENT: 2 cm x 3 cm soft mass at base of left sternocleidomastoid insertion, asymptomatic; pt states has been there for years and has not gotten any larger.  Pulmonary:  Respirations are non-labored, good air movement in all fields, CTAB, no rales, rhonchi, or wheezes. Cardiac: regular rhythm, no detected murmur.  VASCULAR EXAM Carotid Bruits Right Left   Positive Positive     Abdominal aortic pulse is not palpable. Radial pulses are 2+ palpable and equal.          Varicosities in both lower legs, more in right lower legs.                                                                                                                     LE Pulses Right Left       POPLITEAL  2+  palpable   not palpable       POSTERIOR TIBIAL  not palpable   not palpable        DORSALIS PEDIS      ANTERIOR TIBIAL faintly palpable  2+ palpable     Gastrointestinal: soft, nontender, BS WNL, no r/g, no palpable masses. Musculoskeletal: no muscle atrophy/wasting. M/S 5/5 throughout, extremities without ischemic changes. Arthritic changes in hands. Skin: No rashes, no ulcers, no cellulitis.   Neurologic:  A&O X 3; appropriate affect, sensation is normal; speech is normal, CN 2-12 intact, pain and light touch intact in extremities, motor exam as listed above. Psychiatric: Normal thought content, mood appropriate to clinical situation.    Assessment: [name] is a 84 y.o. female who presents with asymptomatic right ICA stenosis progression to 80-99%.   - Asymptomatic right ICA stenosis progression to 80-99%: pt has stage 4 CKD, not a candidate for CTA  of neck unless CO2 is used as the contrast. I discussed with Dr. Trula Slade. Will schedule pt to see Dr. Oneida Alar in 2 weeks to discuss best approach to address the stenosis.    - Asymptomatic varicosities in lower legs: knee high compression hose during the day, pamphlet for discount compression hose outlet given to pt, pt live in [place], the location of the outlet.    - 2 cm x 3 cm soft mass at base of left sternocleidomastoid insertion, asymptomatic; pt states has been there for years and has not gotten any larger.  I advised pt to discuss this with her PCP.   DATA Carotid Duplex (08-05-18): Right ICA: 80-99% stenosis Left ICA: 1-39% stenosis Bilateral ECA: >50% stenosis Bilateral vertebral artery flow is antegrade.  Bilateral subclavian artery waveforms are normal.  Progression of right ICA stenosis compared to the exam on 11-15-17.    Plan: Follow-up in 2 weeks with Dr. Oneida Alar to discuss how to address the asymptomatic 80-99% stenosis of the right ICA.    I discussed in depth with the patient the nature of atherosclerosis, and emphasized the importance of maximal medical management including strict control of blood pressure, blood glucose, and lipid levels, obtaining regular exercise, and continued cessation of smoking.  The patient is aware that without maximal medical management the underlying atherosclerotic disease process will progress, limiting the benefit of any interventions. The patient was given information about stroke prevention and what symptoms should prompt the patient to seek immediate medical care. Thank you for allowing [name] to participate in this patient's care.  Clemon Chambers, RN, MSN, FNP-C [facility] Office: [phone]  Clinic Physician: Oneida Alar  08/05/18 12:48 PM

## 2018-08-21 ENCOUNTER — Encounter: Payer: Self-pay | Admitting: Vascular Surgery

## 2018-08-21 ENCOUNTER — Other Ambulatory Visit: Payer: Self-pay

## 2018-08-21 ENCOUNTER — Ambulatory Visit: Payer: Medicare HMO | Admitting: Vascular Surgery

## 2018-08-21 VITALS — BP 169/76 | HR 86 | Temp 99.4°F | Resp 16 | Ht 68.0 in | Wt 179.0 lb

## 2018-08-21 DIAGNOSIS — I6521 Occlusion and stenosis of right carotid artery: Secondary | ICD-10-CM

## 2018-08-21 NOTE — Progress Notes (Signed)
Patient name: [name] MRN: [id number] DOB: [birth year] Sex: female  REASON FOR VISIT:   To discuss greater than 80% right carotid stenosis.  HPI:   [name] is a pleasant 84 y.o. female who was seen by Dr. Ruta Hinds on 11/15/2017 with a an asymptomatic 60 to 79% right carotid stenosis.  She was set up to see our nurse practitioner in 6 months and duplex at that time showed the stenosis had progressed to greater than 80%.  She was then put on my schedule.  She is asymptomatic.  She denies any history of stroke, TIAs, expressive or receptive aphasia, or amaurosis fugax.  She is on Xarelto for atrial fibrillation.  For this reason she is not currently on aspirin.  She is on a statin.  She does have a pacemaker and is followed by Dr. Crissie Sickles.  She is scheduled to see him later this month.  She denies any recent chest pain chest pressure or significant cardiac history.  Past Medical History:  Diagnosis Date  . Amiodarone pulmonary toxicity   . Anemia    no GI bleeding; on iron  . Atrial fibrillation ([place])   . Biventricular ICD (implantable cardiac defibrillator) in place    BiV ICD for nonischemic cardiomyopathy; BiV responder  . Carotid artery occlusion   . CHF (congestive heart failure) ([place])   . Chronic a-fib    on xarelto; failed DCCV  . Diabetes ([place])   . H/O cardiovascular stress test 08/04/2010   normal imaging; EF 61%  . H/O echocardiogram 07/05/2010   EF 45-50%; aortic valve-mildly sclerotic; LA-mod dilaterd   . Hyperlipemia   . Hypothalamic disease ([place])    on thyroid supplement  . LBBB (left bundle branch block)   . PVD (peripheral vascular disease) ([place])    L RA stent 1994  . RAS (renal artery stenosis) ([place])    a. renal dopp (12/18/08)- Right RA-<60%; L RA stent- open; nl size and shape in both kidneys;  b.  Rena Artery [name] (3/16):  SMA with > 70% stenosis, Bilateral prox RA with 1-59%  . Renal insufficiency, mild    05/01/12 Creat 1.47  .  VT (ventricular tachycardia) ([place]) 9/11   polymorphic VT probably related to sotalol therapy    Family History  Problem Relation Age of Onset  . Heart failure Mother   . Dementia Sister   . Heart attack Brother   . AAA (abdominal aortic aneurysm) Brother   . Stroke Maternal Grandmother     SOCIAL HISTORY: She is not a smoker. Social History   Tobacco Use  . Smoking status: Never Smoker  . Smokeless tobacco: Never Used  Substance Use Topics  . Alcohol use: No    Alcohol/week: 0.0 standard drinks    Allergies  Allergen Reactions  . Gabapentin Swelling  . Pregabalin Swelling       . Morphine Nausea Only  . Penicillins Rash and Other (See Comments)    Has patient had a PCN reaction causing immediate rash, facial/tongue/throat swelling, SOB or lightheadedness with hypotension: No Has patient had a PCN reaction causing severe rash involving mucus membranes or skin necrosis: No Has patient had a PCN reaction that required hospitalization: No - MD office Has patient had a PCN reaction occurring within the last 10 years: No If all of the above answers are "NO", then may proceed with Cephalosporin use.   . Tape Rash  . Ace Inhibitors Other (See Comments)    Angioedema (  ALLERGY/intolerance)  . Carvedilol Other (See Comments)    Myalgias (intolerance)  . Insulin Glargine Itching  . Metoprolol Other (See Comments)    Angioedema (ALLERGY/intolerance)  . Procaine Other (See Comments)    unknown  . Statins Other (See Comments)    Myalgias (intolerance)  . Lisinopril Rash         Current Outpatient Medications  Medication Sig Dispense Refill  . allopurinol (ZYLOPRIM) 100 MG tablet TAKE ONE (1) TABLET BY MOUTH ONCE DAILY    . ALPRAZolam (XANAX) 0.5 MG tablet Take 0.5 mg by mouth at bedtime.     Marland Kitchen amLODipine (NORVASC) 5 MG tablet Take 1 tablet by mouth daily.    . Coenzyme Q10 200 MG capsule Take 200 mg by mouth daily.    . digoxin (LANOXIN) 0.125 MG tablet Take 0.0625 mg by  mouth daily.     Marland Kitchen ezetimibe (ZETIA) 10 MG tablet Take 10 mg by mouth daily.    . fenofibrate micronized (LOFIBRA) 134 MG capsule Take 134 mg by mouth daily.     . fluticasone (FLONASE) 50 MCG/ACT nasal spray Place 2 sprays into both nostrils daily as needed for allergies or rhinitis.     . furosemide (LASIX) 40 MG tablet Take 40 mg by mouth 2 (two) times daily.     . Insulin Detemir (LEVEMIR FLEXPEN) 100 UNIT/ML Pen Inject 30 Units into the skin at bedtime.    Marland Kitchen levothyroxine (SYNTHROID, LEVOTHROID) 50 MCG tablet Take 50 mcg by mouth daily.    Marland Kitchen losartan (COZAAR) 100 MG tablet Take 100 mg by mouth daily.     . magnesium oxide (MAG-OX) 400 MG tablet Take 400 mg by mouth 2 (two) times daily.    . Multiple Vitamins-Minerals (CENTRUM SILVER PO) Take 1 capsule by mouth daily.    Marland Kitchen omega-3 acid ethyl esters (LOVAZA) 1 G capsule Take 2 g by mouth 2 (two) times daily.     Marland Kitchen oxyCODONE-acetaminophen (PERCOCET) 7.5-325 MG per tablet Take 1 tablet by mouth every 4 (four) hours as needed for pain.    . pantoprazole (PROTONIX) 40 MG tablet Take 40 mg by mouth 2 (two) times daily.    . potassium chloride SA (K-DUR,KLOR-CON) 20 MEQ tablet Take 40 mEq by mouth daily.     . pravastatin (PRAVACHOL) 20 MG tablet Take 20 mg by mouth at bedtime.    . Rivaroxaban (XARELTO) 15 MG TABS tablet Take 1 tablet (15 mg total) by mouth daily with supper. (Patient taking differently: Take 15 mg by mouth at bedtime. ) 30 tablet 11  . rOPINIRole (REQUIP) 1 MG tablet Take 1 mg by mouth daily.     . Tetrahydrozoline HCl (VISINE OP) Place 2 drops into both eyes 2 (two) times daily as needed (for dry eyes).    Marland Kitchen tiZANidine (ZANAFLEX) 4 MG tablet Take 1 tablet by mouth daily.     No current facility-administered medications for this visit.     REVIEW OF SYSTEMS:  [X]  denotes positive finding, [ ]  denotes negative finding Cardiac  Comments:  Chest pain or chest pressure:    Shortness of breath upon exertion: x   Short of breath  when lying flat: x   Irregular heart rhythm: x       Vascular    Pain in calf, thigh, or hip brought on by ambulation: x   Pain in feet at night that wakes you up from your sleep:     Blood clot in your veins:  Leg swelling:  x       Pulmonary    Oxygen at home:    Productive cough:     Wheezing:         Neurologic    Sudden weakness in arms or legs:     Sudden numbness in arms or legs:     Sudden onset of difficulty speaking or slurred speech:    Temporary loss of vision in one eye:     Problems with dizziness:         Gastrointestinal    Blood in stool:     Vomited blood:         Genitourinary    Burning when urinating:     Blood in urine:        Psychiatric    Major depression:         Hematologic    Bleeding problems:    Problems with blood clotting too easily:        Skin    Rashes or ulcers:        Constitutional    Fever or chills:     PHYSICAL EXAM:   Vitals:   08/21/18 1103 08/21/18 1114  BP: (!) 152/70 (!) 169/76  Pulse: 86   Resp: 16   Temp: 99.4 F (37.4 C)   TempSrc: Oral   SpO2: 99%   Weight: 179 lb (81.2 kg)   Height: 5\' 8"  (1.727 m)    GENERAL: The patient is a well-nourished female, in no acute distress. The vital signs are documented above. CARDIAC: There is a regular rate and rhythm.  VASCULAR: She has bilateral carotid bruits. Both feet are warm and well-perfused. She has significantly dilated varicose veins of the right lower extremity and also telangiectasias bilaterally. She has mild bilateral lower extremity swelling. PULMONARY: There is good air exchange bilaterally without wheezing or rales. ABDOMEN: Soft and non-tender with normal pitched bowel sounds.  MUSCULOSKELETAL: There are no major deformities or cyanosis. NEUROLOGIC: No focal weakness or paresthesias are detected. SKIN: There are no ulcers or rashes noted. PSYCHIATRIC: The patient has a normal affect.  DATA:    CAROTID DUPLEX: I have reviewed her previous  carotid duplex scan which shows a greater than 80% right carotid stenosis with no significant stenosis on the left.  The stenosis on the right is in the proximal internal carotid artery.  Both vertebral arteries are patent with antegrade flow.  MEDICAL ISSUES:   ASYMPTOMATIC GREATER THAN 80% RIGHT CAROTID STENOSIS: This patient has an asymptomatic greater than 80% right carotid stenosis.  Given the severity of the stenosis I have recommended right carotid endarterectomy in order to lower her risk of future stroke. I have reviewed the indications for carotid endarterectomy, that is to lower the risk of future stroke. I have also reviewed the potential complications of surgery, including but not limited to: bleeding, stroke (perioperative risk 1-2%), MI, nerve injury of other unpredictable medical problems.  Of note I did discuss the case with Dr. Ruta Hinds in the office today.  He is agreeable with this plan.  I have recommended that she begin taking 81 mg of aspirin daily given the severity of the stenosis.  She is scheduled to see Dr. Crissie Sickles later this month and once she has been evaluated by him if she is cleared we will plan on proceeding with right carotid endarterectomy.  We will need to hold her Xarelto preoperatively.  CHRONIC VENOUS INSUFFICIENCY: The patient does have significant chronic  venous insufficiency which is more significant on the right side.  Currently her symptoms are tolerable.  [name] [facility] [phone]

## 2018-08-26 ENCOUNTER — Telehealth: Payer: Self-pay

## 2018-08-26 ENCOUNTER — Telehealth: Payer: Self-pay | Admitting: Internal Medicine

## 2018-08-26 NOTE — Telephone Encounter (Signed)
New Message   Patients daughter is calling because her mother is complaining of being very tired. She said that her father is with the patient and agrees. As well as it seems that she is forgetful now. She does not think her mom has had a stroke but she is very concerned. Please call to discuss.

## 2018-08-26 NOTE — Telephone Encounter (Signed)
Returned pt's daughter (Cheryyl Little) phone call regarding pt feeling "tired and couldn't remember her son's phone number". Daughter said this is out of the norm for pt., but pt. has felt tired in the past from pace maker. I informed her of signs of stroke, and that they are emergent signs and she would need to call 911 immediately. Encouraged her to call Dr. Lovena Le and let him know or PCP.

## 2018-08-26 NOTE — Telephone Encounter (Signed)
Returned call.  Phone rang and rang.  Did not go to a VM.  Will call again tomorrow.

## 2018-08-27 ENCOUNTER — Telehealth: Payer: Self-pay

## 2018-08-27 ENCOUNTER — Telehealth: Payer: Self-pay | Admitting: Internal Medicine

## 2018-08-27 NOTE — Telephone Encounter (Signed)
   [facility] Pre-operative Risk Assessment    Request for surgical clearance:  1. What type of surgery is being performed? Right Carotid Endarterectomy  2. When is this surgery scheduled? Pending Clearance  3. What type of clearance is required (medical clearance vs. Pharmacy clearance to hold med vs. Both)? Both  4. Are there any medications that need to be held prior to surgery and how long? Pending Clearance  5. Practice name and name of physician performing surgery? [facility], Dr. Bobette Mo.  6. What is your office phone number [phone]   7.   What is your office fax number [phone]  8.   Anesthesia type (None, local, MAC, general) ? Pending Clearance, Not stated   [name] 08/27/2018, 3:04 PM  _________________________________________________________________   (provider comments below)

## 2018-08-27 NOTE — Telephone Encounter (Signed)
   Primary Cardiologist:Gregg Lovena Le, MD  Chart reviewed as part of pre-operative protocol coverage. Because of [name] past medical history and time since last visit, he/she will require a follow-up visit in order to better assess preoperative cardiovascular risk.  Pre-op covering staff: - Please schedule appointment and call patient to inform them. - Please contact requesting surgeon's office via preferred method (i.e, phone, fax) to inform them of need for appointment prior to surgery.  If applicable, this message will also be routed to pharmacy pool and/or primary cardiologist for input on holding anticoagulant/antiplatelet agent as requested below so that this information is available at time of patient's appointment.   Yesterday's note mentioned increased fatigue and confusion. Last echo was in 2015.  [place], [place]  08/27/2018, 4:43 PM

## 2018-08-27 NOTE — Telephone Encounter (Signed)
Message received from [place] at [facility], Dr. Tanna Furry nurse. She stated patient does not have upcoming appt., and was seen in their office in June. To get surgery scheduling process started, Sonia Baller suggested we fax over cardiac clearance request form. Cardiac clearance form has been faxed to [phone].

## 2018-08-27 NOTE — Telephone Encounter (Signed)
Returned call to Pt.  Per Pt she is "feeling better today than yesterday".  Advised Pt if she is having new onset confusion she should go to ER to be evaluated d/t high risk for stroke.  Advised she should follow up with her PCP for fatigue and confusion.  Discussed need for cardiac clearance for upcoming carotid surgery.  Advised will call Dr. Scot Dock office and request they submit for cardiac clearance.  Left detailed message for Dr. Scot Dock office notifying to submit for cardiac clearance.  No further action needed.

## 2018-08-27 NOTE — Telephone Encounter (Signed)
Nurse from [facility] is calling to speak to you about this patient.

## 2018-08-28 ENCOUNTER — Telehealth: Payer: Self-pay

## 2018-08-28 NOTE — Telephone Encounter (Signed)
Notified surgeon office of office visit.

## 2018-08-28 NOTE — Telephone Encounter (Signed)
Called patient, LVM to call back regarding surgical clearance and needing an appointment.  Left call back number.

## 2018-08-28 NOTE — Telephone Encounter (Signed)
   [facility] Pre-operative Risk Assessment    Request for surgical clearance:  1. What type of surgery is being performed? Right carotid endarterectomy   2. When is this surgery scheduled?  tbd   3. What type of clearance is required (medical clearance vs. Pharmacy clearance to hold med vs. Both)? medical  4. Are there any medications that need to be held prior to surgery and how long?    5. Practice name and name of physician performing surgery?  [facility]/ Dr Scot Dock   6. What is your office phone number [phone]    7.   What is your office fax number [phone]  8.   Anesthesia type (None, local, MAC, general) ? general   Frederik Schmidt 08/28/2018, 11:31 AM  _________________________________________________________________   (provider comments below)

## 2018-08-28 NOTE — Telephone Encounter (Signed)
Appointment made with [name] at [facility] on December 11th, patient verified understanding.

## 2018-08-29 NOTE — Telephone Encounter (Signed)
No history of CAD, followed for PAF and PTVDP. She is on Xarelto. I'll ask for pharmacy recommendation and then the pt will need to be contacted for clearance.  Kerin Ransom PA-C 08/29/2018 3:46 PM

## 2018-08-30 ENCOUNTER — Telehealth: Payer: Self-pay | Admitting: Cardiology

## 2018-08-30 NOTE — Telephone Encounter (Signed)
I called pt for clearance and she is doing well. Per [facility]CRI she is a moderate risk due to diabetes and the fact that this is vascular surgery, however she has no CAD and her history of CHF was prior to her pacemaker and has had no CHF problems in many years.   She already has an appointment set up with Estella Husk on 12/11 for clearance per the patient and she will keep this.   Daune Perch, AGNP-C [facility] 08/30/2018  9:16 AM Pager: [phone]

## 2018-08-30 NOTE — Telephone Encounter (Signed)
New Message ° ° ° ° ° ° ° ° ° °Patient returned your call °

## 2018-08-30 NOTE — Telephone Encounter (Signed)
Pt takes Xarelto for afib with CHADS2VASc score of 6 (age x2, sex, CHF, DM, CAD/PVD). SCr 1.65, CrCl 68mL/min, pt appropriately on reduced dose Xarelto. Ok to hold Xarelto for 2-3 days prior to procedure.

## 2018-09-17 DIAGNOSIS — Z01818 Encounter for other preprocedural examination: Secondary | ICD-10-CM | POA: Insufficient documentation

## 2018-09-17 NOTE — Progress Notes (Signed)
Cardiology Office Note    Date:  09/18/2018   ID:  KAMARIYA BLEVENS, DOB 1934-07-11, MRN [id number]  PCP:  [name], MD  Cardiologist: Cristopher Peru, MD EPS: None  Chief Complaint  Patient presents with  . Pre-op Exam    History of Present Illness:  [name] is a 84 y.o. female with history of chronic atrial fibrillation on Xarelto CHA2DS2-VASc equals 5, status post remote AV nodal ablation and permanent pacemaker for complete heart block but ultimately upgrade to an ICD after she developed polymorphic ventricular tachycardia without recurrence-likely related to sotalol therapy which was stopped.  She had recent generator change 12/31/2017 where she had a His bundle lead placed after her LV lead threshold had increased significantly.  Last saw Dr. Lovena Le 04/05/2018 she was doing well pacemaker functioning normally.  Also has hypertension, chronic systolic and diastolic CHF.  Last 2D echo 04/02/2014 LVEF 60 to 65% with mild to moderate MR. normal nuclear stress test 2011.  Patient here today for cardiac clearance before undergoing right carotid endarterectomy by Dr. Deitra Mayo.  Patient comes in today accompanied by her daughter. Denies chest pain, palpitations, dyspnea, dyspnea on exertion, dizziness, or presyncope. No regular exercise as she cares for her husband and son.  Past Medical History:  Diagnosis Date  . Amiodarone pulmonary toxicity   . Anemia    no GI bleeding; on iron  . Atrial fibrillation ([place])   . Biventricular ICD (implantable cardiac defibrillator) in place    BiV ICD for nonischemic cardiomyopathy; BiV responder  . Carotid artery occlusion   . CHF (congestive heart failure) ([place])   . Chronic a-fib    on xarelto; failed DCCV  . Diabetes ([place])   . H/O cardiovascular stress test 08/04/2010   normal imaging; EF 61%  . H/O echocardiogram 07/05/2010   EF 45-50%; aortic valve-mildly sclerotic; LA-mod dilaterd   . Hyperlipemia   . Hypothalamic  disease ([place])    on thyroid supplement  . LBBB (left bundle branch block)   . PVD (peripheral vascular disease) ([place])    L RA stent 1994  . RAS (renal artery stenosis) ([place])    a. renal dopp (12/18/08)- Right RA-<60%; L RA stent- open; nl size and shape in both kidneys;  b.  Rena Artery [name] (3/16):  SMA with > 70% stenosis, Bilateral prox RA with 1-59%  . Renal insufficiency, mild    05/01/12 Creat 1.47  . VT (ventricular tachycardia) ([place]) 9/11   polymorphic VT probably related to sotalol therapy    Past Surgical History:  Procedure Laterality Date  . AV NODE ABLATION  06/08/2006   performed by Dr. Beckie Salts; due to Afib with RVR and tachy brady syndrome  . BIV ICD GENERATOR CHANGEOUT N/A 12/31/2017   Procedure: BIV ICD GENERATOR CHANGEOUT;  Surgeon: Evans Lance, MD;  Location: [facility];  Service: Cardiovascular;  Laterality: N/A;  . BIV ICD GENERTAOR CHANGE OUT  11/15/10   Medtronic Protecta [id number] serial #[id number]  . BiV ICD Placement  06/17/2007; 04/20/14   Medtronic Concerta [id number]; RA lead-Med 5076/53cm, [id number]; RV lead- SJM 7121/65cm, [id number]; LV lead- Med 4194/88cm, [id number]; gen change 04/2014 by Dr Lovena Le  . BIV PACEMAKER GENERATOR CHANGE OUT N/A 04/20/2014   Procedure: BIV PACEMAKER GENERATOR CHANGE OUT;  Surgeon: Evans Lance, MD;  Location: [facility] CATH LAB;  Service: Cardiovascular;  Laterality: N/A;  . BRAIN MENINGIOMA EXCISION  2011   L frontal [facility]   .  CARDIOVERSION  07/21/05   converted to sinus brady  . CHOLECYSTECTOMY     Dr. Sherald Hess in [place] and Dr. Lyda Jester follows; surgical stricture post procedure with stent placement  . PV Angio  1994   L Renal artery stent     Current Medications: No outpatient medications have been marked as taking for the 09/18/18 encounter (Office Visit) with Imogene Burn, PA-C.     Allergies:   Gabapentin; Pregabalin; Morphine; Penicillins; Tape; Ace inhibitors; Carvedilol; Insulin  glargine; Metoprolol; Procaine; Statins; and Lisinopril   Social History   Socioeconomic History  . Marital status: Married    Spouse name: Not on file  . Number of children: Not on file  . Years of education: Not on file  . Highest education level: Not on file  Occupational History  . Not on file  Social Needs  . Financial resource strain: Not hard at all  . Food insecurity:    Worry: Patient refused    Inability: Patient refused  . Transportation needs:    Medical: Patient refused    Non-medical: Patient refused  Tobacco Use  . Smoking status: Never Smoker  . Smokeless tobacco: Never Used  Substance and Sexual Activity  . Alcohol use: No    Alcohol/week: 0.0 standard drinks  . Drug use: No  . Sexual activity: Not Currently  Lifestyle  . Physical activity:    Days per week: 2 days    Minutes per session: 20 min  . Stress: Only a little  Relationships  . Social connections:    Talks on phone: Patient refused    Gets together: Patient refused    Attends religious service: Patient refused    Active member of club or organization: Patient refused    Attends meetings of clubs or organizations: Patient refused    Relationship status: Patient refused  Other Topics Concern  . Not on file  Social History Narrative  . Not on file     Family History:  The patient's family history includes AAA (abdominal aortic aneurysm) in her brother; Dementia in her sister; Heart attack in her brother; Heart failure in her mother; Stroke in her maternal grandmother.   ROS:   Please see the history of present illness.    ROS All other systems reviewed and are negative.   PHYSICAL EXAM:   VS:  BP (!) 144/82   Pulse 84   Ht 5\' 8"  (1.727 m)   Wt 180 lb (81.6 kg)   SpO2 95%   BMI 27.37 kg/m   Physical Exam  GEN: Well nourished, well developed, in no acute distress  HEENT: normal  Neck: no JVD, carotid bruits, or masses Cardiac:RRR; no murmurs, rubs, or gallops  Respiratory:   clear to auscultation bilaterally, normal work of breathing GI: soft, nontender, nondistended, + BS Ext: without cyanosis, clubbing, or edema, Good distal pulses bilaterally  Neuro:  Alert and Oriented x 3, Strength and sensation are intact Psych: euthymic mood, full affect  Wt Readings from Last 3 Encounters:  09/18/18 180 lb (81.6 kg)  08/21/18 179 lb (81.2 kg)  08/05/18 182 lb 6.4 oz (82.7 kg)      Studies/Labs Reviewed:   EKG:  EKG is  ordered today.  The ekg ordered today demonstrates atrial sensed v paced.no acute change.  Recent Labs: 12/24/2017: BUN 36; Creatinine, Ser 1.65; Hemoglobin 12.2; Platelets 355; Potassium 4.5; Sodium 142   Lipid Panel No results found for: CHOL, TRIG, HDL, CHOLHDL, VLDL, LDLCALC, LDLDIRECT  Additional  studies/ records that were reviewed today include:  2D echo 2015  study Conclusions  - Left ventricle: The cavity size was mildly dilated. Wall   thickness was normal. Systolic function was normal. The estimated   ejection fraction was in the range of 60% to 65%. - Mitral valve: There was mild to moderate regurgitation. - Left atrium: The atrium was moderately dilated. - Right ventricle: The cavity size was mildly dilated. - Right atrium: The atrium was moderately dilated.    ASSESSMENT:    1. Preoperative clearance   2. Paroxysmal atrial fibrillation (HCC)   3. Automatic implantable cardioverter-defibrillator in situ   4. Dilated cardiomyopathy ([place])   5. NSVT (nonsustained ventricular tachycardia) (HCC)      PLAN:  In order of problems listed above:  Preoperative clearance before undergoing carotid endarterectomy by Dr. Deitra Mayo patient with history of remote AV nodal ablation and permanent pacemaker for complete heart block but ultimately upgraded to ICD after developed polymorphic ventricular tachycardia felt secondary to sotalol therapy which was discontinued.  Recent generator change 12/31/2017. NSVT on last remote  check-longest 13 beats. Previous cardiomyopathy but most recent echo in 2015 normal LVEF 60 to 65%.  Hold Xarelto 2 to 3 days prior to surgery per pharmacy. Patient discussed in detail with Dr. Lovena Le.  She is at moderate risk according to the revised cardiac risk index and 6.6%, functional capacity is 5 minutes.  Dr. Lovena Le said she could proceed with surgery without any further cardiac work-up at this time.  According to the Revised Cardiac Risk Index (RCRI), her Perioperative Risk of Major Cardiac Event is (%): 6.6  Her Functional Capacity in METs is: 5.07 according to the Duke Activity Status Index (DASI).    Chronic atrial fibrillation on Xarelto CHA2DS2-VASc equals 5.  Reviewed by pharmacy clinic and can hold Xarelto 2 to 3 days prior to surgery  Previous cardiomyopathy most recent ejection fraction 60 to 65%  BiV PPM followed by Dr. Lovena Le checked 03/2018 remote check 07/09/18 VT-NS max duration 13 beats. No changes made.  NSVT-asymptomatic.  Medication Adjustments/Labs and Tests Ordered: Current medicines are reviewed at length with the patient today.  Concerns regarding medicines are outlined above.  Medication changes, Labs and Tests ordered today are listed in the Patient Instructions below. Patient Instructions  Medication Instructions:  Your physician recommends that you continue on your current medications as directed. Please refer to the Current Medication list given to you today.  If you need a refill on your cardiac medications before your next appointment, please call your pharmacy.   Lab work: None ordered  If you have labs (blood work) drawn today and your tests are completely normal, you will receive your results only by: Marland Kitchen MyChart Message (if you have MyChart) OR . A paper copy in the mail If you have any lab test that is abnormal or we need to change your treatment, we will call you to review the results.  Testing/Procedures: None ordered  Follow-Up: We will  call you.      Sumner Boast, PA-C  09/18/2018 8:50 AM    [facility] [place], [place], [place]  [address] Phone: [phone]; Fax: [phone]

## 2018-09-18 ENCOUNTER — Encounter: Payer: Self-pay | Admitting: Physician Assistant

## 2018-09-18 ENCOUNTER — Ambulatory Visit: Payer: Medicare HMO | Admitting: Physician Assistant

## 2018-09-18 VITALS — BP 144/82 | HR 84 | Ht 68.0 in | Wt 180.0 lb

## 2018-09-18 DIAGNOSIS — I480 Paroxysmal atrial fibrillation: Secondary | ICD-10-CM

## 2018-09-18 DIAGNOSIS — Z95810 Presence of automatic (implantable) cardiac defibrillator: Secondary | ICD-10-CM

## 2018-09-18 DIAGNOSIS — I4729 Other ventricular tachycardia: Secondary | ICD-10-CM | POA: Insufficient documentation

## 2018-09-18 DIAGNOSIS — Z01818 Encounter for other preprocedural examination: Secondary | ICD-10-CM

## 2018-09-18 DIAGNOSIS — I472 Ventricular tachycardia: Secondary | ICD-10-CM | POA: Diagnosis not present

## 2018-09-18 DIAGNOSIS — I420 Dilated cardiomyopathy: Secondary | ICD-10-CM | POA: Diagnosis not present

## 2018-09-18 HISTORY — DX: Other ventricular tachycardia: I47.29

## 2018-09-18 NOTE — Patient Instructions (Addendum)
Medication Instructions:  Your physician recommends that you continue on your current medications as directed. Please refer to the Current Medication list given to you today.  If you need a refill on your cardiac medications before your next appointment, please call your pharmacy.   Lab work: None ordered  If you have labs (blood work) drawn today and your tests are completely normal, you will receive your results only by: Marland Kitchen MyChart Message (if you have MyChart) OR . A paper copy in the mail If you have any lab test that is abnormal or we need to change your treatment, we will call you to review the results.  Testing/Procedures: None ordered  Follow-Up: We will call you.

## 2018-09-20 ENCOUNTER — Other Ambulatory Visit: Payer: Self-pay | Admitting: *Deleted

## 2018-09-20 ENCOUNTER — Telehealth: Payer: Self-pay | Admitting: *Deleted

## 2018-09-20 NOTE — Telephone Encounter (Signed)
Call to patient. Instructed to be at [facility] admitting department at 5:30 am on 09/24/18 for surgery. To hold Xarelto for 48 hours pre-op. NPO past MN night prior. Expect a call and follow the detailed instructions received from the hospital pre-admission department for this surgery. Plan for at least a 1 night stay.  Verbalized understanding, wrote instructions down.

## 2018-09-23 ENCOUNTER — Encounter (HOSPITAL_COMMUNITY): Payer: Self-pay | Admitting: *Deleted

## 2018-09-23 ENCOUNTER — Other Ambulatory Visit: Payer: Self-pay

## 2018-09-23 NOTE — Anesthesia Preprocedure Evaluation (Addendum)
Anesthesia Evaluation  Patient identified by MRN, date of birth, ID band Patient awake    Reviewed: Allergy & Precautions, NPO status , Patient's Chart, lab work & pertinent test results  History of Anesthesia Complications Negative for: history of anesthetic complications  Airway Mallampati: II  TM Distance: >3 FB Neck ROM: Full    Dental  (+) Edentulous Upper, Edentulous Lower   Pulmonary neg pulmonary ROS,    breath sounds clear to auscultation       Cardiovascular hypertension, Pt. on medications (-) angina+ Peripheral Vascular Disease  + dysrhythmias Atrial Fibrillation and Ventricular Tachycardia + Cardiac Defibrillator (device has never shocked pt)  Rhythm:Regular Rate:Normal  '15 ECHO: EF 60-65%, mild-mod MR   Neuro/Psych TIA   GI/Hepatic Neg liver ROS, GERD  Medicated and Controlled,  Endo/Other  diabetes (glu 115), Insulin DependentHypothyroidism   Renal/GU Renal InsufficiencyRenal disease     Musculoskeletal   Abdominal   Peds  Hematology xarelto   Anesthesia Other Findings   Reproductive/Obstetrics                           Anesthesia Physical Anesthesia Plan  ASA: III  Anesthesia Plan: General   Post-op Pain Management:    Induction: Intravenous  PONV Risk Score and Plan: 3 and Treatment may vary due to age or medical condition, Dexamethasone and Ondansetron  Airway Management Planned: Oral ETT  Additional Equipment: Arterial line  Intra-op Plan:   Post-operative Plan: Extubation in OR  Informed Consent: I have reviewed the patients History and Physical, chart, labs and discussed the procedure including the risks, benefits and alternatives for the proposed anesthesia with the patient or authorized representative who has indicated his/her understanding and acceptance.     Plan Discussed with: CRNA and Surgeon  Anesthesia Plan Comments: (Cardiac clearance  09/18/18 stating: "Preoperative clearance before undergoing carotid endarterectomy by Dr. Deitra Mayo patient with history of remote AV nodal ablation and permanent pacemaker for complete heart block but ultimately upgraded to ICD after developed polymorphic ventricular tachycardia felt secondary to sotalol therapy which was discontinued.  Recent generator change 12/31/2017. NSVT on last remote check-longest 13 beats. Previous cardiomyopathy but most recent echo in 2015 normal LVEF 60 to 65%.  Hold Xarelto 2 to 3 days prior to surgery per pharmacy. Patient discussed in detail with Dr. Lovena Le.  She is at moderate risk according to the revised cardiac risk index and 6.6%, functional capacity is 5 minutes.  Dr. Lovena Le said she could proceed with surgery without any further cardiac work-up at this time. According to the Revised Cardiac Risk Index (RCRI), her Perioperative Risk of Major Cardiac Event is (%): 6.6. Her Functional Capacity in METs is: 5.07 according to the Duke Activity Status Index (DASI)."  Plan routine monitors, A line, GET[employer] Device rep to disable defib portion of pacer/AICD periop)      Anesthesia Quick Evaluation

## 2018-09-23 NOTE — Progress Notes (Signed)
Pt denies SOB and chest pain. Pt under the care of Dr. Lovena Le, Cardiology. Pt denies having a cardiac cath. Peri-op prescription for ICD initiated. Requested recent A1c from Dr. Tobie Poet at [facility]. Pt state that last dose of Xaelto was Saturday night as instructed by MD. Pt made aware to stop taking vitamins fish oil (Lovaza) and  herbal medications.  Do not take any NSAIDs ie: Ibuprofen, Advil, Naproxen (Aleve), Motrin, BC and Goody Powder. Pt made aware to take 21 units of Levemir insulin tonight. Pt made aware to check BG every 2 hours prior to arrival to hospital on DOS. Pt made aware to treat a BG < 70 with  4 ounces of apple or cranberry juice, wait 15 minutes after intervention to recheck BG, if BG remains < 70, call Short Stay unit to speak with a nurse. Pt verbalized understanding of all pre-op instructions. See PA, Anesthesiology note.

## 2018-09-24 ENCOUNTER — Inpatient Hospital Stay (HOSPITAL_COMMUNITY)
Admission: RE | Admit: 2018-09-24 | Discharge: 2018-09-25 | DRG: 038 | Disposition: A | Discharge status: Home / Self Care | Payer: Medicare HMO | Attending: Vascular Surgery | Admitting: Vascular Surgery

## 2018-09-24 ENCOUNTER — Inpatient Hospital Stay (HOSPITAL_COMMUNITY): Payer: Medicare HMO | Admitting: Physician Assistant

## 2018-09-24 ENCOUNTER — Encounter (HOSPITAL_COMMUNITY): Payer: Self-pay | Admitting: *Deleted

## 2018-09-24 ENCOUNTER — Other Ambulatory Visit: Payer: Self-pay

## 2018-09-24 ENCOUNTER — Encounter (HOSPITAL_COMMUNITY)
Admission: RE | Disposition: A | Discharge status: Home / Self Care | Payer: Self-pay | Source: Home / Self Care | Attending: Vascular Surgery

## 2018-09-24 DIAGNOSIS — I4891 Unspecified atrial fibrillation: Secondary | ICD-10-CM | POA: Diagnosis not present

## 2018-09-24 DIAGNOSIS — Z794 Long term (current) use of insulin: Secondary | ICD-10-CM | POA: Diagnosis not present

## 2018-09-24 DIAGNOSIS — I6529 Occlusion and stenosis of unspecified carotid artery: Secondary | ICD-10-CM | POA: Diagnosis present

## 2018-09-24 DIAGNOSIS — Z885 Allergy status to narcotic agent status: Secondary | ICD-10-CM

## 2018-09-24 DIAGNOSIS — Z880 Allergy status to penicillin: Secondary | ICD-10-CM | POA: Diagnosis not present

## 2018-09-24 DIAGNOSIS — I701 Atherosclerosis of renal artery: Secondary | ICD-10-CM | POA: Diagnosis present

## 2018-09-24 DIAGNOSIS — I509 Heart failure, unspecified: Secondary | ICD-10-CM | POA: Diagnosis present

## 2018-09-24 DIAGNOSIS — Z7901 Long term (current) use of anticoagulants: Secondary | ICD-10-CM | POA: Diagnosis not present

## 2018-09-24 DIAGNOSIS — E785 Hyperlipidemia, unspecified: Secondary | ICD-10-CM | POA: Diagnosis present

## 2018-09-24 DIAGNOSIS — I472 Ventricular tachycardia: Secondary | ICD-10-CM | POA: Diagnosis not present

## 2018-09-24 DIAGNOSIS — Z95810 Presence of automatic (implantable) cardiac defibrillator: Secondary | ICD-10-CM

## 2018-09-24 DIAGNOSIS — G2581 Restless legs syndrome: Secondary | ICD-10-CM | POA: Diagnosis present

## 2018-09-24 DIAGNOSIS — I428 Other cardiomyopathies: Secondary | ICD-10-CM | POA: Diagnosis not present

## 2018-09-24 DIAGNOSIS — E039 Hypothyroidism, unspecified: Secondary | ICD-10-CM | POA: Diagnosis present

## 2018-09-24 DIAGNOSIS — I482 Chronic atrial fibrillation, unspecified: Secondary | ICD-10-CM | POA: Diagnosis present

## 2018-09-24 DIAGNOSIS — Z79890 Hormone replacement therapy (postmenopausal): Secondary | ICD-10-CM | POA: Diagnosis not present

## 2018-09-24 DIAGNOSIS — Z888 Allergy status to other drugs, medicaments and biological substances status: Secondary | ICD-10-CM | POA: Diagnosis not present

## 2018-09-24 DIAGNOSIS — Z8249 Family history of ischemic heart disease and other diseases of the circulatory system: Secondary | ICD-10-CM

## 2018-09-24 DIAGNOSIS — I110 Hypertensive heart disease with heart failure: Secondary | ICD-10-CM | POA: Diagnosis not present

## 2018-09-24 DIAGNOSIS — I6521 Occlusion and stenosis of right carotid artery: Principal | ICD-10-CM | POA: Diagnosis present

## 2018-09-24 DIAGNOSIS — E1151 Type 2 diabetes mellitus with diabetic peripheral angiopathy without gangrene: Secondary | ICD-10-CM | POA: Diagnosis present

## 2018-09-24 DIAGNOSIS — I5022 Chronic systolic (congestive) heart failure: Secondary | ICD-10-CM | POA: Diagnosis not present

## 2018-09-24 DIAGNOSIS — M1990 Unspecified osteoarthritis, unspecified site: Secondary | ICD-10-CM | POA: Diagnosis present

## 2018-09-24 DIAGNOSIS — M797 Fibromyalgia: Secondary | ICD-10-CM | POA: Diagnosis present

## 2018-09-24 DIAGNOSIS — Z884 Allergy status to anesthetic agent status: Secondary | ICD-10-CM | POA: Diagnosis not present

## 2018-09-24 DIAGNOSIS — K219 Gastro-esophageal reflux disease without esophagitis: Secondary | ICD-10-CM | POA: Diagnosis present

## 2018-09-24 DIAGNOSIS — Z79899 Other long term (current) drug therapy: Secondary | ICD-10-CM

## 2018-09-24 DIAGNOSIS — I447 Left bundle-branch block, unspecified: Secondary | ICD-10-CM | POA: Diagnosis present

## 2018-09-24 HISTORY — DX: Restless legs syndrome: G25.81

## 2018-09-24 HISTORY — DX: Occlusion and stenosis of unspecified carotid artery: I65.29

## 2018-09-24 HISTORY — DX: Hypothyroidism, unspecified: E03.9

## 2018-09-24 HISTORY — DX: Gastro-esophageal reflux disease without esophagitis: K21.9

## 2018-09-24 HISTORY — DX: Fibromyalgia: M79.7

## 2018-09-24 HISTORY — DX: Presence of spectacles and contact lenses: Z97.3

## 2018-09-24 HISTORY — PX: ENDARTERECTOMY: SHX5162

## 2018-09-24 HISTORY — DX: Complete loss of teeth, unspecified cause, unspecified class: K08.109

## 2018-09-24 HISTORY — DX: Unspecified osteoarthritis, unspecified site: M19.90

## 2018-09-24 HISTORY — DX: Essential (primary) hypertension: I10

## 2018-09-24 HISTORY — PX: CAROTID ENDARTERECTOMY: SUR193

## 2018-09-24 HISTORY — DX: Pneumonia, unspecified organism: J18.9

## 2018-09-24 HISTORY — DX: Presence of dental prosthetic device (complete) (partial): Z97.2

## 2018-09-24 LAB — COMPREHENSIVE METABOLIC PANEL
ALT: 17 U/L (ref 0–44)
AST: 19 U/L (ref 15–41)
Albumin: 3.8 g/dL (ref 3.5–5.0)
Alkaline Phosphatase: 29 U/L — ABNORMAL LOW (ref 38–126)
Anion gap: 12 (ref 5–15)
BUN: 33 mg/dL — ABNORMAL HIGH (ref 8–23)
CO2: 27 mmol/L (ref 22–32)
Calcium: 9.9 mg/dL (ref 8.9–10.3)
Chloride: 102 mmol/L (ref 98–111)
Creatinine, Ser: 1.95 mg/dL — ABNORMAL HIGH (ref 0.44–1.00)
GFR calc Af Amer: 27 mL/min — ABNORMAL LOW (ref >60)
GFR calc non Af Amer: 23 mL/min — ABNORMAL LOW (ref >60)
Glucose, Bld: 125 mg/dL — ABNORMAL HIGH (ref 70–99)
Potassium: 4.6 mmol/L (ref 3.5–5.1)
Sodium: 141 mmol/L (ref 135–145)
Total Bilirubin: 0.7 mg/dL (ref 0.3–1.2)
Total Protein: 6.3 g/dL — ABNORMAL LOW (ref 6.5–8.1)

## 2018-09-24 LAB — URINALYSIS, ROUTINE W REFLEX MICROSCOPIC
Bacteria, UA: NONE SEEN
Bilirubin Urine: NEGATIVE
Glucose, UA: NEGATIVE mg/dL
Hgb urine dipstick: NEGATIVE
Ketones, ur: NEGATIVE mg/dL
Nitrite: NEGATIVE
Protein, ur: NEGATIVE mg/dL
Specific Gravity, Urine: 1.011 (ref 1.005–1.030)
pH: 6 (ref 5.0–8.0)

## 2018-09-24 LAB — CBC
HCT: 40.2 % (ref 36.0–46.0)
Hemoglobin: 12.1 g/dL (ref 12.0–15.0)
MCH: 26.4 pg (ref 26.0–34.0)
MCHC: 30.1 g/dL (ref 30.0–36.0)
MCV: 87.8 fL (ref 80.0–100.0)
Platelets: 301 10*3/uL (ref 150–400)
RBC: 4.58 MIL/uL (ref 3.87–5.11)
RDW: 15.9 % — ABNORMAL HIGH (ref 11.5–15.5)
WBC: 6.1 10*3/uL (ref 4.0–10.5)
nRBC: 0 % (ref 0.0–0.2)

## 2018-09-24 LAB — GLUCOSE, CAPILLARY
Glucose-Capillary: 115 mg/dL — ABNORMAL HIGH (ref 70–99)
Glucose-Capillary: 119 mg/dL — ABNORMAL HIGH (ref 70–99)
Glucose-Capillary: 175 mg/dL — ABNORMAL HIGH (ref 70–99)
Glucose-Capillary: 131 mg/dL — ABNORMAL HIGH (ref 70–99)

## 2018-09-24 LAB — ABO/RH: ABO/RH(D): A POS

## 2018-09-24 LAB — APTT: aPTT: 28 seconds (ref 24–36)

## 2018-09-24 LAB — PROTIME-INR
INR: 0.96
Prothrombin Time: 12.7 seconds (ref 11.4–15.2)

## 2018-09-24 LAB — TYPE AND SCREEN
ABO/RH(D): A POS
Antibody Screen: NEGATIVE

## 2018-09-24 LAB — HEMOGLOBIN A1C
Hgb A1c MFr Bld: 7 % — ABNORMAL HIGH (ref 4.8–5.6)
Mean Plasma Glucose: 154.2 mg/dL

## 2018-09-24 LAB — SURGICAL PCR SCREEN
MRSA, PCR: POSITIVE — AB
Staphylococcus aureus: POSITIVE — AB

## 2018-09-24 SURGERY — ENDARTERECTOMY, CAROTID
Anesthesia: General | Laterality: Right

## 2018-09-24 MED ORDER — AMLODIPINE BESYLATE 5 MG PO TABS
5.0000 mg | ORAL_TABLET | Freq: Every day | ORAL | Status: DC
  Administered 2018-09-25: 5 mg via ORAL
  Filled 2018-09-24: qty 1

## 2018-09-24 MED ORDER — PHENOL 1.4 % MT LIQD: 1.0000 | OROMUCOSAL | Status: DC | PRN

## 2018-09-24 MED ORDER — FENTANYL CITRATE (PF) 100 MCG/2ML IJ SOLN
25.0000 ug | INTRAMUSCULAR | Status: DC | PRN
  Administered 2018-09-24 (×2): 25 ug via INTRAVENOUS

## 2018-09-24 MED ORDER — CLEVIDIPINE BUTYRATE 0.5 MG/ML IV EMUL
0.0000 mg/h | INTRAVENOUS | Status: AC
  Administered 2018-09-24: 5 mg/h via INTRAVENOUS
  Filled 2018-09-24: qty 50

## 2018-09-24 MED ORDER — DEXAMETHASONE SODIUM PHOSPHATE 10 MG/ML IJ SOLN
INTRAMUSCULAR | Status: DC | PRN
  Administered 2018-09-24: 4 mg via INTRAVENOUS

## 2018-09-24 MED ORDER — SODIUM CHLORIDE 0.9 % IV SOLN
INTRAVENOUS | Status: DC | PRN
  Administered 2018-09-24: 500 mL

## 2018-09-24 MED ORDER — PROTAMINE SULFATE 10 MG/ML IV SOLN
INTRAVENOUS | Status: DC | PRN
  Administered 2018-09-24: 50 mg via INTRAVENOUS

## 2018-09-24 MED ORDER — PROPOFOL 10 MG/ML IV BOLUS
INTRAVENOUS | Status: DC | PRN
  Administered 2018-09-24: 30 mg via INTRAVENOUS
  Administered 2018-09-24: 70 mg via INTRAVENOUS

## 2018-09-24 MED ORDER — ACETAMINOPHEN 325 MG PO TABS: 325.0000 mg | ORAL_TABLET | ORAL | Status: DC | PRN

## 2018-09-24 MED ORDER — ASPIRIN 300 MG RE SUPP
300.0000 mg | Freq: Once | RECTAL | Status: DC
  Filled 2018-09-24: qty 1

## 2018-09-24 MED ORDER — INSULIN DETEMIR 100 UNIT/ML ~~LOC~~ SOLN
42.0000 [IU] | Freq: Every day | SUBCUTANEOUS | Status: DC
  Administered 2018-09-24: 42 [IU] via SUBCUTANEOUS
  Filled 2018-09-24: qty 0.42

## 2018-09-24 MED ORDER — SODIUM CHLORIDE 0.9 % IV SOLN
0.0125 ug/kg/min | INTRAVENOUS | Status: AC
  Administered 2018-09-24: .1 ug/kg/min via INTRAVENOUS
  Filled 2018-09-24: qty 2000

## 2018-09-24 MED ORDER — ROCURONIUM BROMIDE 50 MG/5ML IV SOSY
PREFILLED_SYRINGE | INTRAVENOUS | Status: AC
  Filled 2018-09-24: qty 5

## 2018-09-24 MED ORDER — EZETIMIBE 10 MG PO TABS
10.0000 mg | ORAL_TABLET | Freq: Every evening | ORAL | Status: DC
  Administered 2018-09-24: 10 mg via ORAL
  Filled 2018-09-24: qty 1

## 2018-09-24 MED ORDER — VANCOMYCIN HCL IN DEXTROSE 1-5 GM/200ML-% IV SOLN
1000.0000 mg | INTRAVENOUS | Status: AC
  Administered 2018-09-24: 1000 mg via INTRAVENOUS
  Filled 2018-09-24: qty 200

## 2018-09-24 MED ORDER — MAGNESIUM OXIDE 400 (241.3 MG) MG PO TABS
400.0000 mg | ORAL_TABLET | Freq: Two times a day (BID) | ORAL | Status: DC
  Administered 2018-09-24 – 2018-09-25 (×2): 400 mg via ORAL
  Filled 2018-09-24 (×2): qty 1

## 2018-09-24 MED ORDER — CHLORHEXIDINE GLUCONATE 4 % EX LIQD: 60.0000 mL | Freq: Once | CUTANEOUS | Status: DC

## 2018-09-24 MED ORDER — MUPIROCIN 2 % EX OINT
TOPICAL_OINTMENT | Freq: Once | CUTANEOUS | Status: AC
  Administered 2018-09-24: 1 via NASAL
  Filled 2018-09-24 (×2): qty 22

## 2018-09-24 MED ORDER — LABETALOL HCL 5 MG/ML IV SOLN: 10.0000 mg | INTRAVENOUS | Status: DC | PRN

## 2018-09-24 MED ORDER — ROCURONIUM BROMIDE 10 MG/ML (PF) SYRINGE
PREFILLED_SYRINGE | INTRAVENOUS | Status: DC | PRN
  Administered 2018-09-24: 50 mg via INTRAVENOUS
  Administered 2018-09-24: 10 mg via INTRAVENOUS

## 2018-09-24 MED ORDER — LACTATED RINGERS IV SOLN
INTRAVENOUS | Status: DC | PRN
  Administered 2018-09-24: 07:00:00 via INTRAVENOUS

## 2018-09-24 MED ORDER — MIDAZOLAM HCL 2 MG/2ML IJ SOLN: 0.5000 mg | Freq: Once | INTRAMUSCULAR | Status: DC | PRN

## 2018-09-24 MED ORDER — SUGAMMADEX SODIUM 200 MG/2ML IV SOLN
INTRAVENOUS | Status: DC | PRN
  Administered 2018-09-24: 200 mg via INTRAVENOUS

## 2018-09-24 MED ORDER — LEVOTHYROXINE SODIUM 50 MCG PO TABS
50.0000 ug | ORAL_TABLET | Freq: Every day | ORAL | Status: DC
  Administered 2018-09-25: 50 ug via ORAL
  Filled 2018-09-24 (×2): qty 1

## 2018-09-24 MED ORDER — DIGOXIN 0.0625 MG HALF TABLET
0.0625 mg | ORAL_TABLET | Freq: Every evening | ORAL | Status: DC
  Administered 2018-09-24: 0.0625 mg via ORAL
  Filled 2018-09-24: qty 1

## 2018-09-24 MED ORDER — PANTOPRAZOLE SODIUM 40 MG PO TBEC
40.0000 mg | DELAYED_RELEASE_TABLET | Freq: Two times a day (BID) | ORAL | Status: DC
  Administered 2018-09-24 – 2018-09-25 (×2): 40 mg via ORAL
  Filled 2018-09-24 (×2): qty 1

## 2018-09-24 MED ORDER — HEPARIN SODIUM (PORCINE) 1000 UNIT/ML IJ SOLN
INTRAMUSCULAR | Status: AC
  Filled 2018-09-24: qty 1

## 2018-09-24 MED ORDER — TIZANIDINE HCL 4 MG PO TABS
4.0000 mg | ORAL_TABLET | Freq: Every day | ORAL | Status: DC
  Administered 2018-09-24: 4 mg via ORAL
  Filled 2018-09-24: qty 1

## 2018-09-24 MED ORDER — ESMOLOL HCL 100 MG/10ML IV SOLN
INTRAVENOUS | Status: AC
  Filled 2018-09-24: qty 10

## 2018-09-24 MED ORDER — ROPINIROLE HCL 1 MG PO TABS
1.0000 mg | ORAL_TABLET | Freq: Every day | ORAL | Status: DC
  Administered 2018-09-25: 1 mg via ORAL
  Filled 2018-09-24: qty 1

## 2018-09-24 MED ORDER — ACETAMINOPHEN 650 MG RE SUPP: 325.0000 mg | RECTAL | Status: DC | PRN

## 2018-09-24 MED ORDER — HYDRALAZINE HCL 20 MG/ML IJ SOLN: 5.0000 mg | INTRAMUSCULAR | Status: DC | PRN

## 2018-09-24 MED ORDER — INSULIN ASPART 100 UNIT/ML ~~LOC~~ SOLN
0.0000 [IU] | Freq: Three times a day (TID) | SUBCUTANEOUS | Status: DC
  Administered 2018-09-24: 3 [IU] via SUBCUTANEOUS

## 2018-09-24 MED ORDER — 0.9 % SODIUM CHLORIDE (POUR BTL) OPTIME
TOPICAL | Status: DC | PRN
  Administered 2018-09-24: 2000 mL

## 2018-09-24 MED ORDER — SODIUM CHLORIDE 0.9 % IV SOLN
INTRAVENOUS | Status: DC
  Administered 2018-09-24: 14:00:00 via INTRAVENOUS

## 2018-09-24 MED ORDER — MEPERIDINE HCL 50 MG/ML IJ SOLN: 6.2500 mg | INTRAMUSCULAR | Status: DC | PRN

## 2018-09-24 MED ORDER — ONDANSETRON HCL 4 MG/2ML IJ SOLN
INTRAMUSCULAR | Status: AC
  Filled 2018-09-24: qty 2

## 2018-09-24 MED ORDER — LIDOCAINE-EPINEPHRINE (PF) 1 %-1:200000 IJ SOLN
INTRAMUSCULAR | Status: DC | PRN
  Administered 2018-09-24: 10 mL via INTRADERMAL

## 2018-09-24 MED ORDER — PRAVASTATIN SODIUM 10 MG PO TABS
20.0000 mg | ORAL_TABLET | Freq: Every day | ORAL | Status: DC
  Administered 2018-09-24: 20 mg via ORAL
  Filled 2018-09-24: qty 2

## 2018-09-24 MED ORDER — SODIUM CHLORIDE 0.9 % IV SOLN
INTRAVENOUS | Status: DC | PRN
  Administered 2018-09-24: 25 ug/min via INTRAVENOUS

## 2018-09-24 MED ORDER — LIDOCAINE 2% (20 MG/ML) 5 ML SYRINGE
INTRAMUSCULAR | Status: AC
  Filled 2018-09-24: qty 5

## 2018-09-24 MED ORDER — LOSARTAN POTASSIUM 50 MG PO TABS
100.0000 mg | ORAL_TABLET | Freq: Every day | ORAL | Status: DC
  Administered 2018-09-25: 100 mg via ORAL
  Filled 2018-09-24: qty 2

## 2018-09-24 MED ORDER — HYDROMORPHONE HCL 1 MG/ML IJ SOLN
0.5000 mg | INTRAMUSCULAR | Status: DC | PRN
  Administered 2018-09-24: 0.5 mg via INTRAVENOUS
  Filled 2018-09-24: qty 1

## 2018-09-24 MED ORDER — GUAIFENESIN-DM 100-10 MG/5ML PO SYRP: 15.0000 mL | ORAL_SOLUTION | ORAL | Status: DC | PRN

## 2018-09-24 MED ORDER — FENTANYL CITRATE (PF) 100 MCG/2ML IJ SOLN
INTRAMUSCULAR | Status: AC
  Filled 2018-09-24: qty 2

## 2018-09-24 MED ORDER — ALUM & MAG HYDROXIDE-SIMETH 200-200-20 MG/5ML PO SUSP: 15.0000 mL | ORAL | Status: DC | PRN

## 2018-09-24 MED ORDER — DOCUSATE SODIUM 100 MG PO CAPS
100.0000 mg | ORAL_CAPSULE | Freq: Every day | ORAL | Status: DC
  Administered 2018-09-25: 100 mg via ORAL
  Filled 2018-09-24: qty 1

## 2018-09-24 MED ORDER — DEXAMETHASONE SODIUM PHOSPHATE 10 MG/ML IJ SOLN
INTRAMUSCULAR | Status: AC
  Filled 2018-09-24: qty 1

## 2018-09-24 MED ORDER — VANCOMYCIN HCL 1000 MG IV SOLR: INTRAVENOUS | Status: DC | PRN

## 2018-09-24 MED ORDER — DEXTRAN 40 IN SALINE 10-0.9 % IV SOLN
INTRAVENOUS | Status: AC
  Filled 2018-09-24: qty 500

## 2018-09-24 MED ORDER — PROMETHAZINE HCL 25 MG/ML IJ SOLN: 6.2500 mg | INTRAMUSCULAR | Status: DC | PRN

## 2018-09-24 MED ORDER — POTASSIUM CHLORIDE CRYS ER 20 MEQ PO TBCR: 20.0000 meq | EXTENDED_RELEASE_TABLET | Freq: Every day | ORAL | Status: DC | PRN

## 2018-09-24 MED ORDER — LIDOCAINE-EPINEPHRINE (PF) 1 %-1:200000 IJ SOLN
INTRAMUSCULAR | Status: AC
  Filled 2018-09-24: qty 30

## 2018-09-24 MED ORDER — DEXTRAN 40 IN SALINE 10-0.9 % IV SOLN
INTRAVENOUS | Status: AC | PRN
  Administered 2018-09-24: 500 mL

## 2018-09-24 MED ORDER — SODIUM CHLORIDE 0.9 % IV SOLN
INTRAVENOUS | Status: AC
  Filled 2018-09-24: qty 1.2

## 2018-09-24 MED ORDER — FENTANYL CITRATE (PF) 250 MCG/5ML IJ SOLN
INTRAMUSCULAR | Status: AC
  Filled 2018-09-24: qty 5

## 2018-09-24 MED ORDER — PROPOFOL 10 MG/ML IV BOLUS
INTRAVENOUS | Status: AC
  Filled 2018-09-24: qty 20

## 2018-09-24 MED ORDER — ONDANSETRON HCL 4 MG/2ML IJ SOLN: 4.0000 mg | Freq: Four times a day (QID) | INTRAMUSCULAR | Status: DC | PRN

## 2018-09-24 MED ORDER — ONDANSETRON HCL 4 MG/2ML IJ SOLN
INTRAMUSCULAR | Status: DC | PRN
  Administered 2018-09-24: 4 mg via INTRAVENOUS

## 2018-09-24 MED ORDER — FENTANYL CITRATE (PF) 100 MCG/2ML IJ SOLN
INTRAMUSCULAR | Status: DC | PRN
  Administered 2018-09-24: 150 ug via INTRAVENOUS

## 2018-09-24 MED ORDER — ASPIRIN EC 81 MG PO TBEC
81.0000 mg | DELAYED_RELEASE_TABLET | Freq: Every day | ORAL | Status: DC
  Administered 2018-09-25: 81 mg via ORAL
  Filled 2018-09-24: qty 1

## 2018-09-24 MED ORDER — ALPRAZOLAM 0.5 MG PO TABS
0.5000 mg | ORAL_TABLET | Freq: Every day | ORAL | Status: DC
  Administered 2018-09-24: 0.5 mg via ORAL
  Filled 2018-09-24: qty 1

## 2018-09-24 MED ORDER — ALLOPURINOL 100 MG PO TABS
100.0000 mg | ORAL_TABLET | Freq: Every evening | ORAL | Status: DC
  Administered 2018-09-24: 100 mg via ORAL
  Filled 2018-09-24: qty 1

## 2018-09-24 MED ORDER — LIDOCAINE 2% (20 MG/ML) 5 ML SYRINGE
INTRAMUSCULAR | Status: DC | PRN
  Administered 2018-09-24: 40 mg via INTRAVENOUS

## 2018-09-24 MED ORDER — LIDOCAINE HCL (PF) 1 % IJ SOLN
INTRAMUSCULAR | Status: AC
  Filled 2018-09-24: qty 30

## 2018-09-24 MED ORDER — HEPARIN SODIUM (PORCINE) 1000 UNIT/ML IJ SOLN
INTRAMUSCULAR | Status: DC | PRN
  Administered 2018-09-24: 2000 [IU] via INTRAVENOUS
  Administered 2018-09-24: 8000 [IU] via INTRAVENOUS

## 2018-09-24 MED ORDER — SODIUM CHLORIDE 0.9 % IV SOLN: 500.0000 mL | Freq: Once | INTRAVENOUS | Status: DC | PRN

## 2018-09-24 MED ORDER — OXYCODONE-ACETAMINOPHEN 7.5-325 MG PO TABS
1.0000 | ORAL_TABLET | Freq: Four times a day (QID) | ORAL | Status: DC | PRN
  Administered 2018-09-24 – 2018-09-25 (×3): 1 via ORAL
  Filled 2018-09-24 (×3): qty 1

## 2018-09-24 MED ORDER — FUROSEMIDE 40 MG PO TABS
40.0000 mg | ORAL_TABLET | Freq: Two times a day (BID) | ORAL | Status: DC
  Administered 2018-09-24 – 2018-09-25 (×2): 40 mg via ORAL
  Filled 2018-09-24 (×2): qty 1

## 2018-09-24 MED ORDER — SODIUM CHLORIDE 0.9 % IV SOLN: INTRAVENOUS | Status: DC

## 2018-09-24 SURGICAL SUPPLY — 42 items
ADH SKN CLS APL DERMABOND .7 (GAUZE/BANDAGES/DRESSINGS) ×1
BAG DECANTER FOR FLEXI CONT (MISCELLANEOUS) ×2 IMPLANT
CANISTER SUCT 3000ML PPV (MISCELLANEOUS) ×2 IMPLANT
CANNULA VESSEL 3MM 2 BLNT TIP (CANNULA) ×6 IMPLANT
CATH ROBINSON RED A/P 18FR (CATHETERS) ×2 IMPLANT
CLIP VESOCCLUDE MED 24/CT (CLIP) ×2 IMPLANT
CLIP VESOCCLUDE SM WIDE 24/CT (CLIP) ×2 IMPLANT
COVER WAND RF STERILE (DRAPES) ×2 IMPLANT
CRADLE DONUT ADULT HEAD (MISCELLANEOUS) ×2 IMPLANT
DERMABOND ADVANCED (GAUZE/BANDAGES/DRESSINGS) ×1
DERMABOND ADVANCED .7 DNX12 (GAUZE/BANDAGES/DRESSINGS) ×1 IMPLANT
DRAIN CHANNEL 15F RND FF W/TCR (WOUND CARE) IMPLANT
ELECT REM PT RETURN 9FT ADLT (ELECTROSURGICAL) ×2
ELECTRODE REM PT RTRN 9FT ADLT (ELECTROSURGICAL) ×1 IMPLANT
EVACUATOR SILICONE 100CC (DRAIN) IMPLANT
GLOVE BIO SURGEON STRL SZ7.5 (GLOVE) ×2 IMPLANT
GLOVE BIOGEL PI IND STRL 8 (GLOVE) ×1 IMPLANT
GLOVE BIOGEL PI INDICATOR 8 (GLOVE) ×1
GOWN STRL REUS W/ TWL LRG LVL3 (GOWN DISPOSABLE) ×3 IMPLANT
GOWN STRL REUS W/TWL LRG LVL3 (GOWN DISPOSABLE) ×6
GRAFT VASC PATCH XENOSURE 1X14 (Vascular Products) ×1 IMPLANT
KIT BASIN OR (CUSTOM PROCEDURE TRAY) ×2 IMPLANT
KIT SHUNT ARGYLE CAROTID ART 6 (VASCULAR PRODUCTS) IMPLANT
KIT TURNOVER KIT B (KITS) ×2 IMPLANT
NDL HYPO 25GX1X1/2 BEV (NEEDLE) ×1 IMPLANT
NEEDLE HYPO 25GX1X1/2 BEV (NEEDLE) ×2 IMPLANT
NS IRRIG 1000ML POUR BTL (IV SOLUTION) ×4 IMPLANT
PACK CAROTID (CUSTOM PROCEDURE TRAY) ×2 IMPLANT
PAD ARMBOARD 7.5X6 YLW CONV (MISCELLANEOUS) ×4 IMPLANT
SHUNT CAROTID BYPASS 10 (VASCULAR PRODUCTS) IMPLANT
SHUNT CAROTID BYPASS 12 (VASCULAR PRODUCTS) IMPLANT
SHUNT CAROTID BYPASS 12FRX15.5 (VASCULAR PRODUCTS) ×1 IMPLANT
SPONGE SURGIFOAM ABS GEL 100 (HEMOSTASIS) IMPLANT
SUT PROLENE 6 0 BV (SUTURE) ×4 IMPLANT
SUT SILK 2 0 PERMA HAND 18 BK (SUTURE) IMPLANT
SUT VIC AB 3-0 SH 27 (SUTURE) ×2
SUT VIC AB 3-0 SH 27X BRD (SUTURE) ×1 IMPLANT
SUT VICRYL 4-0 PS2 18IN ABS (SUTURE) ×2 IMPLANT
SYR 20CC LL (SYRINGE) ×2 IMPLANT
SYR CONTROL 10ML LL (SYRINGE) ×2 IMPLANT
TOWEL GREEN STERILE (TOWEL DISPOSABLE) ×2 IMPLANT
WATER STERILE IRR 1000ML POUR (IV SOLUTION) ×2 IMPLANT

## 2018-09-24 NOTE — Progress Notes (Signed)
Notified Tomi Bamberger, medtronic rep. Of pt's surgery time.

## 2018-09-24 NOTE — Progress Notes (Signed)
Pacer rep Shawn here and reprogrammed pacemaker post op.

## 2018-09-24 NOTE — Progress Notes (Signed)
   VASCULAR SURGERY POSTOP NOTE:   Doing well postop.  Anticipate discharge in a.m.  She can restart her Xarelto tomorrow.  She should go on 81 mg of aspirin.  She is allergic to statins.  SUBJECTIVE:   No complaints.  PHYSICAL EXAM:   Vitals:   09/24/18 1204 09/24/18 1238 09/24/18 1503 09/24/18 1634  BP:  (!) 124/50 (!) 119/45 (!) 109/45  Pulse: 70 71  70  Resp: 15 16  14   Temp: (!) 97.5 F (36.4 C) 98.2 F (36.8 C)  98.7 F (37.1 C)  TempSrc:  Oral  Oral  SpO2: 92%     Weight:  88.1 kg    Height:  5\' 8"  (1.727 m)     Her neck incision looks fine. No focal weakness or paresthesias. Tongue is midline.  LABS:   CBG (last 3)  Recent Labs    09/24/18 0544 09/24/18 1037 09/24/18 1633  GLUCAP 115* 119* 175*    PROBLEM LIST:    Active Problems:   Carotid artery stenosis   CURRENT MEDS:   . allopurinol  100 mg Oral QPM  . ALPRAZolam  0.5 mg Oral QHS  . [START ON 09/25/2018] amLODipine  5 mg Oral Daily  . [START ON 09/25/2018] aspirin EC  81 mg Oral Daily  . aspirin  300 mg Rectal Once  . digoxin  0.0625 mg Oral QPM  . [START ON 09/25/2018] docusate sodium  100 mg Oral Daily  . ezetimibe  10 mg Oral QPM  . fentaNYL      . furosemide  40 mg Oral BID  . insulin aspart  0-15 Units Subcutaneous TID WC  . insulin detemir  42 Units Subcutaneous QHS  . [START ON 09/25/2018] levothyroxine  50 mcg Oral QAC breakfast  . [START ON 09/25/2018] losartan  100 mg Oral Daily  . magnesium oxide  400 mg Oral BID  . pantoprazole  40 mg Oral BID  . pravastatin  20 mg Oral QHS  . [START ON 09/25/2018] rOPINIRole  1 mg Oral Daily  . tiZANidine  4 mg Oral QHS    Deitra Mayo Beeper: [phone] Office: [phone] 09/24/2018

## 2018-09-24 NOTE — H&P (Addendum)
Patient name: [name]     MRN: [id number]        DOB: 03-05-1934        Sex: female  REASON FOR VISIT:   For right CEA.   HPI:   [name] is a pleasant 84 y.o. female who was seen by Dr. Ruta Hinds on 11/15/2017 with a an asymptomatic 60 to 79% right carotid stenosis.  She was set up to see our nurse practitioner in 6 months and duplex at that time showed the stenosis had progressed to greater than 80%.  She was then put on my schedule.  She is asymptomatic.  She denies any history of stroke, TIAs, expressive or receptive aphasia, or amaurosis fugax.  She is on Xarelto for atrial fibrillation.  For this reason she is not currently on aspirin.  She is on a statin.  She does have a pacemaker and is followed by Dr. Crissie Sickles.  She is scheduled to see him later this month.  She denies any recent chest pain chest pressure or significant cardiac history.      Past Medical History:  Diagnosis Date  . Amiodarone pulmonary toxicity   . Anemia    no GI bleeding; on iron  . Atrial fibrillation ([place])   . Biventricular ICD (implantable cardiac defibrillator) in place    BiV ICD for nonischemic cardiomyopathy; BiV responder  . Carotid artery occlusion   . CHF (congestive heart failure) ([place])   . Chronic a-fib    on xarelto; failed DCCV  . Diabetes ([place])   . H/O cardiovascular stress test 08/04/2010   normal imaging; EF 61%  . H/O echocardiogram 07/05/2010   EF 45-50%; aortic valve-mildly sclerotic; LA-mod dilaterd   . Hyperlipemia   . Hypothalamic disease ([place])    on thyroid supplement  . LBBB (left bundle branch block)   . PVD (peripheral vascular disease) ([place])    L RA stent 1994  . RAS (renal artery stenosis) ([place])    a. renal dopp (12/18/08)- Right RA-<60%; L RA stent- open; nl size and shape in both kidneys;  b.  Rena Artery [name] (3/16):  SMA with > 70% stenosis, Bilateral prox RA with 1-59%  . Renal insufficiency, mild     05/01/12 Creat 1.47  . VT (ventricular tachycardia) ([place]) 9/11   polymorphic VT probably related to sotalol therapy         Family History  Problem Relation Age of Onset  . Heart failure Mother   . Dementia Sister   . Heart attack Brother   . AAA (abdominal aortic aneurysm) Brother   . Stroke Maternal Grandmother     SOCIAL HISTORY: She is not a smoker. Social History        Tobacco Use  . Smoking status: Never Smoker  . Smokeless tobacco: Never Used  Substance Use Topics  . Alcohol use: No    Alcohol/week: 0.0 standard drinks         Allergies  Allergen Reactions  . Gabapentin Swelling  . Pregabalin Swelling       . Morphine Nausea Only  . Penicillins Rash and Other (See Comments)    Has patient had a PCN reaction causing immediate rash, facial/tongue/throat swelling, SOB or lightheadedness with hypotension: No Has patient had a PCN reaction causing severe rash involving mucus membranes or skin necrosis: No Has patient had a PCN reaction that required hospitalization: No - MD office Has patient had a PCN reaction occurring within the last  10 years: No If all of the above answers are "NO", then may proceed with Cephalosporin use.   . Tape Rash  . Ace Inhibitors Other (See Comments)    Angioedema (ALLERGY/intolerance)  . Carvedilol Other (See Comments)    Myalgias (intolerance)  . Insulin Glargine Itching  . Metoprolol Other (See Comments)    Angioedema (ALLERGY/intolerance)  . Procaine Other (See Comments)    unknown  . Statins Other (See Comments)    Myalgias (intolerance)  . Lisinopril Rash               Current Outpatient Medications  Medication Sig Dispense Refill  . allopurinol (ZYLOPRIM) 100 MG tablet TAKE ONE (1) TABLET BY MOUTH ONCE DAILY    . ALPRAZolam (XANAX) 0.5 MG tablet Take 0.5 mg by mouth at bedtime.     Marland Kitchen amLODipine (NORVASC) 5 MG tablet Take 1 tablet by mouth daily.    . Coenzyme Q10 200 MG  capsule Take 200 mg by mouth daily.    . digoxin (LANOXIN) 0.125 MG tablet Take 0.0625 mg by mouth daily.     Marland Kitchen ezetimibe (ZETIA) 10 MG tablet Take 10 mg by mouth daily.    . fenofibrate micronized (LOFIBRA) 134 MG capsule Take 134 mg by mouth daily.     . fluticasone (FLONASE) 50 MCG/ACT nasal spray Place 2 sprays into both nostrils daily as needed for allergies or rhinitis.     . furosemide (LASIX) 40 MG tablet Take 40 mg by mouth 2 (two) times daily.     . Insulin Detemir (LEVEMIR FLEXPEN) 100 UNIT/ML Pen Inject 30 Units into the skin at bedtime.    Marland Kitchen levothyroxine (SYNTHROID, LEVOTHROID) 50 MCG tablet Take 50 mcg by mouth daily.    Marland Kitchen losartan (COZAAR) 100 MG tablet Take 100 mg by mouth daily.     . magnesium oxide (MAG-OX) 400 MG tablet Take 400 mg by mouth 2 (two) times daily.    . Multiple Vitamins-Minerals (CENTRUM SILVER PO) Take 1 capsule by mouth daily.    Marland Kitchen omega-3 acid ethyl esters (LOVAZA) 1 G capsule Take 2 g by mouth 2 (two) times daily.     Marland Kitchen oxyCODONE-acetaminophen (PERCOCET) 7.5-325 MG per tablet Take 1 tablet by mouth every 4 (four) hours as needed for pain.    . pantoprazole (PROTONIX) 40 MG tablet Take 40 mg by mouth 2 (two) times daily.    . potassium chloride SA (K-DUR,KLOR-CON) 20 MEQ tablet Take 40 mEq by mouth daily.     . pravastatin (PRAVACHOL) 20 MG tablet Take 20 mg by mouth at bedtime.    . Rivaroxaban (XARELTO) 15 MG TABS tablet Take 1 tablet (15 mg total) by mouth daily with supper. (Patient taking differently: Take 15 mg by mouth at bedtime. ) 30 tablet 11  . rOPINIRole (REQUIP) 1 MG tablet Take 1 mg by mouth daily.     . Tetrahydrozoline HCl (VISINE OP) Place 2 drops into both eyes 2 (two) times daily as needed (for dry eyes).    Marland Kitchen tiZANidine (ZANAFLEX) 4 MG tablet Take 1 tablet by mouth daily.     No current facility-administered medications for this visit.     REVIEW OF SYSTEMS:  [X]  denotes positive finding, [ ]   denotes negative finding Cardiac  Comments:  Chest pain or chest pressure:    Shortness of breath upon exertion: x   Short of breath when lying flat: x   Irregular heart rhythm: x       Vascular  Pain in calf, thigh, or hip brought on by ambulation: x   Pain in feet at night that wakes you up from your sleep:     Blood clot in your veins:    Leg swelling:  x       Pulmonary    Oxygen at home:    Productive cough:     Wheezing:         Neurologic    Sudden weakness in arms or legs:     Sudden numbness in arms or legs:     Sudden onset of difficulty speaking or slurred speech:    Temporary loss of vision in one eye:     Problems with dizziness:         Gastrointestinal    Blood in stool:     Vomited blood:         Genitourinary    Burning when urinating:     Blood in urine:        Psychiatric    Major depression:         Hematologic    Bleeding problems:    Problems with blood clotting too easily:        Skin    Rashes or ulcers:        Constitutional    Fever or chills:     PHYSICAL EXAM:    Today's Vitals   09/24/18 0539 09/24/18 0638  BP: (!) 167/53   Pulse: 84   Resp: 18   Temp: 98 F (36.7 C)   TempSrc: Oral   SpO2: 99%   Weight: 81.6 kg   Height: 5\' 8"  (1.727 m)   PainSc:  0-No pain   Body mass index is 27.37 kg/m.                                              GENERAL: The patient is a well-nourished female, in no acute distress. The vital signs are documented above. CARDIAC: There is a regular rate and rhythm.  VASCULAR: She has bilateral carotid bruits. Both feet are warm and well-perfused. She has significantly dilated varicose veins of the right lower extremity and also telangiectasias bilaterally. She has mild bilateral lower extremity swelling. PULMONARY: There is good air exchange bilaterally without wheezing or rales. ABDOMEN:  Soft and non-tender with normal pitched bowel sounds.  MUSCULOSKELETAL: There are no major deformities or cyanosis. NEUROLOGIC: No focal weakness or paresthesias are detected. SKIN: There are no ulcers or rashes noted. PSYCHIATRIC: The patient has a normal affect.  DATA:    CAROTID DUPLEX: I have reviewed her previous carotid duplex scan which shows a greater than 80% right carotid stenosis with no significant stenosis on the left.  The stenosis on the right is in the proximal internal carotid artery.  Both vertebral arteries are patent with antegrade flow.  MEDICAL ISSUES:   ASYMPTOMATIC GREATER THAN 80% RIGHT CAROTID STENOSIS: This patient has an asymptomatic greater than 80% right carotid stenosis.  Given the severity of the stenosis I have recommended right carotid endarterectomy in order to lower her risk of future stroke. I have reviewed the indications for carotid endarterectomy, that is to lower the risk of future stroke. I have also reviewed the potential complications of surgery, including but not limited to: bleeding, stroke (perioperative risk 1-2%), MI, nerve injury of other unpredictable medical problems.  Of  note I did discuss the case with Dr. Ruta Hinds in the office today.  He is agreeable with this plan.  I have recommended that she begin taking 81 mg of aspirin daily given the severity of the stenosis.  She is scheduled to see Dr. Crissie Sickles later this month and once she has been evaluated by him if she is cleared we will plan on proceeding with right carotid endarterectomy.  We will need to hold her Xarelto preoperatively.   Deitra Mayo [facility] [phone]

## 2018-09-24 NOTE — Progress Notes (Signed)
Left lower arm near radial art line is sl. puffy.  Dr Jenita Seashore aware. Will continue to monitor.

## 2018-09-24 NOTE — Progress Notes (Signed)
Patient arrived from PACU s/p right CEA with Dr. Scot Dock.  Telemetry monitor applied and CCMD notified.  CHG bath done.  Patient oriented to unit and room to include call light and phone.  Will continue to monitor.

## 2018-09-24 NOTE — Anesthesia Procedure Notes (Addendum)
Procedure Name: Intubation Date/Time: 09/24/2018 8:41 AM Performed by: Donzetta Matters, RN Pre-anesthesia Checklist: Patient identified, Emergency Drugs available, Suction available and Patient being monitored Patient Re-evaluated:Patient Re-evaluated prior to induction Oxygen Delivery Method: Circle system utilized Preoxygenation: Pre-oxygenation with 100% oxygen Induction Type: IV induction Ventilation: Mask ventilation without difficulty Laryngoscope Size: Miller and 2 Grade View: Grade I Tube type: Oral Tube size: 7.0 mm Number of attempts: 1 Airway Equipment and Method: Stylet Placement Confirmation: ETT inserted through vocal cords under direct vision,  positive ETCO2 and breath sounds checked- equal and bilateral Secured at: 22 cm Tube secured with: Tape Dental Injury: Teeth and Oropharynx as per pre-operative assessment  Comments: [place]

## 2018-09-24 NOTE — Transfer of Care (Signed)
Immediate Anesthesia Transfer of Care Note  [name]  Procedure(s) Performed: ENDARTERECTOMY CAROTID RIGHT (Right )  Patient Location: PACU  Anesthesia Type:General  Level of Consciousness: awake, alert , oriented and patient cooperative  Airway & Oxygen Therapy: Patient Spontanous Breathing and Patient connected to face mask oxygen  Post-op Assessment: Report given to RN and Post -op Vital signs reviewed and stable  Post vital signs: Reviewed and stable  Last Vitals:  Vitals Value Taken Time  BP 117/58 09/24/2018 10:37 AM  Temp    Pulse 69 09/24/2018 10:38 AM  Resp 21 09/24/2018 10:38 AM  SpO2 96 % 09/24/2018 10:38 AM  Vitals shown include unvalidated device data.  Last Pain:  Vitals:   09/24/18 0638  TempSrc:   PainSc: 0-No pain      Patients Stated Pain Goal: 6 (16/10/96 0454)  Complications: No apparent anesthesia complications

## 2018-09-24 NOTE — Op Note (Signed)
    NAME: [name]    MRN: [id number] DOB: 05/02/1934    DATE OF OPERATION: 09/24/2018  PREOP DIAGNOSIS:    Asymptomatic greater than 80% right carotid stenosis  POSTOP DIAGNOSIS:    Same  PROCEDURE:    Right carotid endarterectomy with bovine pericardial patch angioplasty  SURGEON: Judeth Cornfield. Scot Dock, MD, FACS  ASSIST: Arlee Muslim, PA  ANESTHESIA: General  EBL: Minimal  INDICATIONS:    [name] is a 84 y.o. female who is being followed with carotid disease.  The right carotid stenosis progressed to greater than 80%.  Right carotid endarterectomy was recommended in order to lower her risk of future stroke.  FINDINGS:   90 percent calcified plaque at the carotid bifurcation.  TECHNIQUE:   The patient was taken to the operating room and received a general anesthetic.  The right neck was prepped and draped in usual sterile fashion.  An incision was made along the anterior border of the sternocleidomastoid and the dissection carried down to the common carotid artery which was dissected free and controlled with Rummel tourniquet.  The facial vein was divided between 2-0 silk ties.  The internal carotid artery was controlled above the plaque.  The superior thyroid artery was controlled and the external carotid artery control.  The patient had been heparinized.  ACT was monitored throughout the procedure.  Clamp was then placed on the internal then the external and the common carotid artery.  A longitudinal arteriotomy was made in the common carotid artery and this was extended through the plaque into the internal carotid artery.  A 12 shunt was placed into the internal carotid artery, backbled and then placed into the common carotid artery and secured with Rummel tourniquet.  Flow was reestablished to the shunt.  Flow was checked with the Doppler.  An endarterectomy plane was established proximally and the plaque was sharply divided.  Eversion endarterectomy was  performed of the external carotid artery.  Distally there was a nice tapering the plaque and no tacking sutures were required.  The artery was irrigated with copious amounts of heparin and dextran and all loose debris removed.  The bovine pericardial patch was then sewn using continuous 6-0 Prolene suture.  Prior to completing the patch closure the artery was backbled and flushed appropriately and the anastomosis completed.  Flow was reestablished first to the external carotid artery and into the internal carotid artery.  At the completion was an excellent pulse distal to the patch and a good Doppler signal with good diastolic flow.  The heparin was partially reversed with protamine.  The wound was then closed with a deep layer of 3-0 Vicryl.  The platysma was closed with running 3-0 Vicryl.  The skin was closed with a 4-0 Vicryl.  Dermabond was applied.  The patient tolerated the procedure well and awoke neurologically intact.  All needle and sponge counts were correct.  Deitra Mayo, MD, FACS [facility]  DATE OF DICTATION:   09/24/2018

## 2018-09-24 NOTE — Anesthesia Procedure Notes (Addendum)
Arterial Line Insertion Start/End12/17/2019 8:41 AM Performed by: Annye Asa, MD, anesthesiologist  Patient location: Pre-op. Preanesthetic checklist: patient identified, IV checked, site marked, risks and benefits discussed, surgical consent, monitors and equipment checked, pre-op evaluation and anesthesia consent Lidocaine 1% used for infiltration Right, radial was placed Catheter size: 20 G Hand hygiene performed  and Seldinger technique used Allen's test indicative of satisfactory collateral circulation Attempts: 1 (SRNA attemped on the RA, hematoma notied; pressure applied; CRNA attemped on the LA.; weak pulse) Procedure performed without using ultrasound guided technique. Following insertion, Biopatch and dressing applied. Post procedure assessment: normal  Patient tolerated the procedure well with no immediate complications.

## 2018-09-24 NOTE — Anesthesia Postprocedure Evaluation (Signed)
Anesthesia Post Note  [name]  Procedure(s) Performed: ENDARTERECTOMY CAROTID RIGHT (Right )     Patient location during evaluation: PACU Anesthesia Type: General Level of consciousness: awake and alert, oriented and patient cooperative Pain management: pain level controlled Vital Signs Assessment: post-procedure vital signs reviewed and stable Respiratory status: spontaneous breathing, nonlabored ventilation, respiratory function stable and patient connected to nasal cannula oxygen Cardiovascular status: blood pressure returned to baseline and stable Postop Assessment: no apparent nausea or vomiting Anesthetic complications: no    Last Vitals:  Vitals:   09/24/18 1503 09/24/18 1634  BP: (!) 119/45 (!) 109/45  Pulse:  70  Resp:  14  Temp:  37.1 C  SpO2:      Last Pain:  Vitals:   09/24/18 1634  TempSrc: Oral  PainSc:                  Iokepa Geffre,E. Michaila Kenney

## 2018-09-24 NOTE — Anesthesia Procedure Notes (Signed)
Arterial Line Insertion Start/End12/17/2019 8:39 AM Performed by: Annye Asa, MD, anesthesiologist  Patient location: OR. Preanesthetic checklist: patient identified and pre-op evaluation Left, radial was placed Catheter size: 20 G Hand hygiene performed  and Seldinger technique used Allen's test indicative of satisfactory collateral circulation Attempts: 2 Procedure performed using ultrasound guided technique. Ultrasound Notes:anatomy identified and no ultrasound evidence of intravascular and/or intraneural injection Following insertion, dressing applied and Biopatch. Post procedure assessment: normal  Patient tolerated the procedure well with no immediate complications.

## 2018-09-25 ENCOUNTER — Encounter (HOSPITAL_COMMUNITY): Payer: Self-pay | Admitting: Vascular Surgery

## 2018-09-25 ENCOUNTER — Telehealth: Payer: Self-pay | Admitting: Vascular Surgery

## 2018-09-25 LAB — BASIC METABOLIC PANEL
Anion gap: 11 (ref 5–15)
BUN: 37 mg/dL — ABNORMAL HIGH (ref 8–23)
CO2: 23 mmol/L (ref 22–32)
Calcium: 8.8 mg/dL — ABNORMAL LOW (ref 8.9–10.3)
Chloride: 105 mmol/L (ref 98–111)
Creatinine, Ser: 1.82 mg/dL — ABNORMAL HIGH (ref 0.44–1.00)
GFR calc Af Amer: 29 mL/min — ABNORMAL LOW (ref >60)
GFR calc non Af Amer: 25 mL/min — ABNORMAL LOW (ref >60)
Glucose, Bld: 96 mg/dL (ref 70–99)
Potassium: 3.9 mmol/L (ref 3.5–5.1)
Sodium: 139 mmol/L (ref 135–145)

## 2018-09-25 LAB — CBC
HCT: 33.6 % — ABNORMAL LOW (ref 36.0–46.0)
Hemoglobin: 10.7 g/dL — ABNORMAL LOW (ref 12.0–15.0)
MCH: 28.1 pg (ref 26.0–34.0)
MCHC: 31.8 g/dL (ref 30.0–36.0)
MCV: 88.2 fL (ref 80.0–100.0)
Platelets: 233 10*3/uL (ref 150–400)
RBC: 3.81 MIL/uL — ABNORMAL LOW (ref 3.87–5.11)
RDW: 16.1 % — ABNORMAL HIGH (ref 11.5–15.5)
WBC: 6.2 10*3/uL (ref 4.0–10.5)
nRBC: 0 % (ref 0.0–0.2)

## 2018-09-25 LAB — POCT ACTIVATED CLOTTING TIME: Activated Clotting Time: 191 seconds

## 2018-09-25 LAB — GLUCOSE, CAPILLARY: Glucose-Capillary: 77 mg/dL (ref 70–99)

## 2018-09-25 MED ORDER — OXYCODONE-ACETAMINOPHEN 7.5-325 MG PO TABS: 1.0000 | ORAL_TABLET | Freq: Four times a day (QID) | ORAL | 0 refills | Status: DC | PRN

## 2018-09-25 MED ORDER — ASPIRIN 81 MG PO TBEC: 81.0000 mg | DELAYED_RELEASE_TABLET | Freq: Every day | ORAL | Status: DC

## 2018-09-25 NOTE — Progress Notes (Signed)
   VASCULAR SURGERY ASSESSMENT & PLAN:   1 Day Post-Op s/p: Right carotid endarterectomy.  Doing well.  Home today on aspirin and back on her Xarelto.  She has an allergy to statins.  Follow-up in 2 to 3 weeks.  SUBJECTIVE:   No complaints this morning.  PHYSICAL EXAM:   Vitals:   09/24/18 1238 09/24/18 1503 09/24/18 1634 09/24/18 2144  BP: (!) 124/50 (!) 119/45 (!) 109/45 (!) 131/49  Pulse: 71  70 70  Resp: 16  14 16   Temp: 98.2 F (36.8 C)  98.7 F (37.1 C) 98.6 F (37 C)  TempSrc: Oral  Oral Oral  SpO2:    95%  Weight: 88.1 kg     Height: 5\' 8"  (1.727 m)      Her right neck incision looks fine. NEURO: No focal weakness or paresthesias.   LABS:   Lab Results  Component Value Date   WBC 6.2 09/25/2018   HGB 10.7 (L) 09/25/2018   HCT 33.6 (L) 09/25/2018   MCV 88.2 09/25/2018   PLT 233 09/25/2018   CBG (last 3)  Recent Labs    09/24/18 1633 09/24/18 2148 09/25/18 0626  GLUCAP 175* 131* 77    PROBLEM LIST:    Active Problems:   Carotid artery stenosis  CURRENT MEDS:   . allopurinol  100 mg Oral QPM  . ALPRAZolam  0.5 mg Oral QHS  . amLODipine  5 mg Oral Daily  . aspirin EC  81 mg Oral Daily  . aspirin  300 mg Rectal Once  . digoxin  0.0625 mg Oral QPM  . docusate sodium  100 mg Oral Daily  . ezetimibe  10 mg Oral QPM  . furosemide  40 mg Oral BID  . insulin aspart  0-15 Units Subcutaneous TID WC  . insulin detemir  42 Units Subcutaneous QHS  . levothyroxine  50 mcg Oral QAC breakfast  . losartan  100 mg Oral Daily  . magnesium oxide  400 mg Oral BID  . pantoprazole  40 mg Oral BID  . pravastatin  20 mg Oral QHS  . rOPINIRole  1 mg Oral Daily  . tiZANidine  4 mg Oral QHS    Deitra Mayo Beeper: [phone] Office: [phone] 09/25/2018

## 2018-09-25 NOTE — Progress Notes (Signed)
09/25/2018 11:29 AM Discharge AVS meds taken today and those due this evening reviewed by [facility] RN.  Follow-up appointments and when to call md reviewed.  D/C IV and TELE.  Questions and concerns addressed.   D/C home per orders. [place]

## 2018-09-25 NOTE — Discharge Summary (Signed)
Discharge Summary     [name] [birth year] 84 y.o. female  [id number]  Admission Date: 09/24/2018  Discharge Date: 09/25/18  Physician: Angelia Mould, *  Admission Diagnosis: RIGHT CAROTID ARTERY STENOSIS  Discharge Day services:    see progress note 09/25/18 Physical Exam: Vitals:   09/24/18 1634 09/24/18 2144  BP: (!) 109/45 (!) 131/49  Pulse: 70 70  Resp: 14 16  Temp: 98.7 F (37.1 C) 98.6 F (37 C)  SpO2:  95%    Hospital Course:  The patient was admitted to the hospital and taken to the operating room on 09/24/2018 and underwent right carotid endarterectomy.  The pt tolerated the procedure well and was transported to the PACU in good condition.   By POD 1, the pt neuro status remains at baseline  The remainder of the hospital course consisted of increasing mobilization and increasing intake of solids without difficulty.  She will follow up in office in 2-3 weeks to see Dr. Scot Dock.  Discharge instructions were reviewed with the patient and she voices her understanding.  She will be discharged this morning in stable condition.   Recent Labs    09/24/18 0643 09/25/18 0531  NA 141 139  K 4.6 3.9  CL 102 105  CO2 27 23  GLUCOSE 125* 96  BUN 33* 37*  CALCIUM 9.9 8.8*   Recent Labs    09/24/18 0643 09/25/18 0531  WBC 6.1 6.2  HGB 12.1 10.7*  HCT 40.2 33.6*  PLT 301 233   Recent Labs    09/24/18 0643  INR 0.96       Discharge Diagnosis:  RIGHT CAROTID ARTERY STENOSIS  Secondary Diagnosis: Patient Active Problem List   Diagnosis Date Noted  . Carotid artery stenosis 09/24/2018  . NSVT (nonsustained ventricular tachycardia) ([place]) 09/18/2018  . Preoperative clearance 09/17/2018  . Tightness of heel cord, left 11/29/2017  . Diabetes mellitus without complication ([place]) 11/57/2620  . Hallux limitus of right foot 06/27/2016  . ATRIAL FIBRILLATION 12/26/2010  . VENTRICULAR FIBRILLATION 12/26/2010  . Chronic systolic  heart failure ([place]) 12/26/2010  . Automatic implantable cardioverter-defibrillator in situ 12/26/2010  . Cardiomyopathy ([place]) 12/23/2010  . ATRIOVENTRICULAR BLOCK, 3RD DEGREE 12/23/2010  . AV BLOCK 12/23/2010  . ICD (implantable cardioverter-defibrillator), biventricular, in situ 12/23/2010  . Complete atrioventricular block ([place]) 12/23/2010   Past Medical History:  Diagnosis Date  . Amiodarone pulmonary toxicity   . Anemia    no GI bleeding; on iron  . Arthritis   . Atrial fibrillation ([place])   . Biventricular ICD (implantable cardiac defibrillator) in place    BiV ICD for nonischemic cardiomyopathy; BiV responder  . Carotid artery occlusion   . CHF (congestive heart failure) ([place])   . Chronic a-fib    on xarelto; failed DCCV  . Diabetes ([place])   . Fibromyalgia   . Full dentures   . GERD (gastroesophageal reflux disease)   . H/O cardiovascular stress test 08/04/2010   normal imaging; EF 61%  . H/O echocardiogram 07/05/2010   EF 45-50%; aortic valve-mildly sclerotic; LA-mod dilaterd   . Hyperlipemia   . Hypertension   . Hypothalamic disease ([place])    on thyroid supplement  . Hypothyroidism   . LBBB (left bundle branch block)   . Pneumonia   . PVD (peripheral vascular disease) ([place])    L RA stent 1994  . RAS (renal artery stenosis) ([place])    a. renal dopp (12/18/08)- Right RA-<60%; L RA stent- open; nl size and shape  in both kidneys;  b.  Rena Artery [name] (3/16):  SMA with > 70% stenosis, Bilateral prox RA with 1-59%  . Renal insufficiency, mild    05/01/12 Creat 1.47  . Restless leg syndrome   . VT (ventricular tachycardia) ([place]) 9/11   polymorphic VT probably related to sotalol therapy  . Wears glasses     Allergies as of 09/25/2018      Reactions   Gabapentin Swelling   Pregabalin Swelling      Morphine Nausea Only   Penicillins Rash, Other (See Comments)   Has patient had a PCN reaction causing immediate rash, facial/tongue/throat swelling, SOB or lightheadedness  with hypotension: No Has patient had a PCN reaction causing severe rash involving mucus membranes or skin necrosis: No Has patient had a PCN reaction that required hospitalization: No - MD office Has patient had a PCN reaction occurring within the last 10 years: No If all of the above answers are "NO", then may proceed with Cephalosporin use.   Tape Rash   Ace Inhibitors Other (See Comments)   Angioedema (ALLERGY/intolerance)   Carvedilol Other (See Comments)   Myalgias (intolerance)   Insulin Glargine Itching   Metoprolol Other (See Comments)   Angioedema (ALLERGY/intolerance)   Procaine Other (See Comments)   unknown   Statins Other (See Comments)   Myalgias (intolerance)   Lisinopril Rash         Medication List    TAKE these medications   allopurinol 100 MG tablet Commonly known as:  ZYLOPRIM Take 100 mg by mouth every evening.   ALPRAZolam 0.5 MG tablet Commonly known as:  XANAX Take 0.5 mg by mouth at bedtime.   amLODipine 5 MG tablet Commonly known as:  NORVASC Take 5 mg by mouth daily.   aspirin 81 MG EC tablet Take 1 tablet (81 mg total) by mouth daily.   Coenzyme Q10 200 MG capsule Take 200 mg by mouth daily.   digoxin 0.125 MG tablet Commonly known as:  LANOXIN Take 0.0625 mg by mouth every evening.   ezetimibe 10 MG tablet Commonly known as:  ZETIA Take 10 mg by mouth every evening.   fenofibrate micronized 134 MG capsule Commonly known as:  LOFIBRA Take 134 mg by mouth daily.   fluticasone 50 MCG/ACT nasal spray Commonly known as:  FLONASE Place 2 sprays into both nostrils daily as needed for allergies or rhinitis.   furosemide 40 MG tablet Commonly known as:  LASIX Take 40 mg by mouth 2 (two) times daily.   LEVEMIR FLEXPEN 100 UNIT/ML Pen Generic drug:  Insulin Detemir Inject 42 Units into the skin at bedtime.   levothyroxine 50 MCG tablet Commonly known as:  SYNTHROID, LEVOTHROID Take 50 mcg by mouth daily before breakfast.     losartan 100 MG tablet Commonly known as:  COZAAR Take 100 mg by mouth daily.   magnesium oxide 400 MG tablet Commonly known as:  MAG-OX Take 400 mg by mouth 2 (two) times daily.   multivitamin with minerals Tabs tablet Take 1 tablet by mouth daily. Centrum Silver   nystatin cream Commonly known as:  MYCOSTATIN Apply 1 application topically daily. Mix with triamcinolone  cream & apply to affected area of forehead   omega-3 acid ethyl esters 1 g capsule Commonly known as:  LOVAZA Take 1 g by mouth 2 (two) times daily.   oxyCODONE-acetaminophen 7.5-325 MG tablet Commonly known as:  PERCOCET Take 1 tablet by mouth every 6 (six) hours as needed (back pain.).  pantoprazole 40 MG tablet Commonly known as:  PROTONIX Take 40 mg by mouth 2 (two) times daily.   pravastatin 20 MG tablet Commonly known as:  PRAVACHOL Take 20 mg by mouth at bedtime.   Rivaroxaban 15 MG Tabs tablet Commonly known as:  XARELTO Take 1 tablet (15 mg total) by mouth daily with supper. What changed:  when to take this   rOPINIRole 1 MG tablet Commonly known as:  REQUIP Take 1 mg by mouth daily.   tiZANidine 4 MG tablet Commonly known as:  ZANAFLEX Take 4 mg by mouth at bedtime.   triamcinolone cream 0.1 % Commonly known as:  KENALOG Apply 1 application topically daily. Mix with nystatin cream & apply to affected area of forehead   VISINE OP Place 2 drops into both eyes 4 (four) times daily as needed (for dry eyes).        Discharge Instructions:   [facility] Discharge Instructions Carotid Endarterectomy (CEA)  Please refer to the following instructions for your post-procedure care. Your surgeon or physician assistant will discuss any changes with you.  Activity  You are encouraged to walk as much as you can. You can slowly return to normal activities but must avoid strenuous activity and heavy lifting until your doctor tell you it's OK. Avoid  activities such as vacuuming or swinging a golf club. You can drive after one week if you are comfortable and you are no longer taking prescription pain medications. It is normal to feel tired for serval weeks after your surgery. It is also normal to have difficulty with sleep habits, eating, and bowel movements after surgery. These will go away with time.  Bathing/Showering  You may shower after you come home. Do not soak in a bathtub, hot tub, or swim until the incision heals completely.  Incision Care  Shower every day. Clean your incision with mild soap and water. Pat the area dry with a clean towel. You do not need a bandage unless otherwise instructed. Do not apply any ointments or creams to your incision. You may have skin glue on your incision. Do not peel it off. It will come off on its own in about one week. Your incision may feel thickened and raised for several weeks after your surgery. This is normal and the skin will soften over time. For Men Only: It's OK to shave around the incision but do not shave the incision itself for 2 weeks. It is common to have numbness under your chin that could last for several months.  Diet  Resume your normal diet. There are no special food restrictions following this procedure. A low fat/low cholesterol diet is recommended for all patients with vascular disease. In order to heal from your surgery, it is CRITICAL to get adequate nutrition. Your body requires vitamins, minerals, and protein. Vegetables are the best source of vitamins and minerals. Vegetables also provide the perfect balance of protein. Processed food has little nutritional value, so try to avoid this.  Medications  Resume taking all of your medications unless your doctor or physician assistant tells you not to.  If your incision is causing pain, you may take over-the- counter pain relievers such as acetaminophen (Tylenol). If you were prescribed a stronger pain medication, please be aware  these medications can cause nausea and constipation.  Prevent nausea by taking the medication with a snack or meal. Avoid constipation by drinking plenty of fluids and eating foods with a high amount of  fiber, such as fruits, vegetables, and grains. Do not take Tylenol if you are taking prescription pain medications.  Follow Up  Our office will schedule a follow up appointment 2-3 weeks following discharge.  Please call us immediately for any of the following conditions  Increased pain, redness, drainage (pus) from your incision site. Fever of 101 degrees or higher. If you should develop stroke (slurred speech, difficulty swallowing, weakness on one side of your body, loss of vision) you should call 911 and go to the nearest emergency room.  Reduce your risk of vascular disease:  Stop smoking. If you would like help call [facility] at 1-800-QUIT-NOW (972)525-8330) or [place] at ([phone]. Manage your cholesterol Maintain a desired weight Control your diabetes Keep your blood pressure down  If you have any questions, please call the office at [phone].  Disposition: home  Patient's condition: is Good  Follow up: 1. Dr. Scot Dock in 2 weeks.   Dagoberto Ligas, PA-C [facility] [phone]   --- For [facility] use ---   Modified Rankin score at D/C (0-6): 0  IV medication needed for:  1. Hypertension: No 2. Hypotension: No  Post-op Complications: No  1. Post-op CVA or TIA: No  If yes: Event classification (right eye, left eye, right cortical, left cortical, verterobasilar, other):   If yes: Timing of event (intra-op, <6 hrs post-op, >=6 hrs post-op, unknown):   2. CN injury: No  If yes: CN  injuried   3. Myocardial infarction: No  If yes: Dx by (EKG or clinical, Troponin):   4.  CHF: No  5.  Dysrhythmia (new): No  6. Wound infection: No  7. Reperfusion symptoms: No  8. Return to OR: No  If yes: return to OR for  (bleeding, neurologic, other CEA incision, other):   Discharge medications: Statin use:  No ASA use:  Yes   Beta blocker use:  No ACE-Inhibitor use:  No  ARB use:  Yes CCB use: Yes P2Y12 Antagonist use: No, [ ]  Plavix, [ ]  Plasugrel, [ ]  Ticlopinine, [ ]  Ticagrelor, [ ]  Other, [ ]  No for medical reason, [ ]  Non-compliant, [ ]  Not-indicated Anti-coagulant use:  No, [ ]  Warfarin, [ ]  Rivaroxaban, [ ]  Dabigatran,

## 2018-09-25 NOTE — Telephone Encounter (Signed)
sch appt phone NA mld ltr 10/23/2018 10am p/o MD

## 2018-09-25 NOTE — Telephone Encounter (Signed)
-----   Message from Dagoberto Ligas, PA-C sent at 09/25/2018  7:28 AM EST -----  Can you schedule an appt for this pt in 2-3 weeks with Dr. Scot Dock.  PO R CEA. Thanks, [facility]

## 2018-09-25 NOTE — Discharge Instructions (Signed)
   [facility] [place]  Discharge Instructions   Carotid Endarterectomy (CEA)  Please refer to the following instructions for your post-procedure care. Your surgeon or physician assistant will discuss any changes with you.  Activity  You are encouraged to walk as much as you can. You can slowly return to normal activities but must avoid strenuous activity and heavy lifting until your doctor tell you it's OK. Avoid activities such as vacuuming or swinging a golf club. You can drive after one week if you are comfortable and you are no longer taking prescription pain medications. It is normal to feel tired for serval weeks after your surgery. It is also normal to have difficulty with sleep habits, eating, and bowel movements after surgery. These will go away with time.  Bathing/Showering  You may shower after you come home. Do not soak in a bathtub, hot tub, or swim until the incision heals completely.  Incision Care  Shower every day. Clean your incision with mild soap and water. Pat the area dry with a clean towel. You do not need a bandage unless otherwise instructed. Do not apply any ointments or creams to your incision. You may have skin glue on your incision. Do not peel it off. It will come off on its own in about one week. Your incision may feel thickened and raised for several weeks after your surgery. This is normal and the skin will soften over time. For Men Only: It's OK to shave around the incision but do not shave the incision itself for 2 weeks. It is common to have numbness under your chin that could last for several months.  Diet  Resume your normal diet. There are no special food restrictions following this procedure. A low fat/low cholesterol diet is recommended for all patients with vascular disease. In order to heal from your surgery, it is CRITICAL to get adequate nutrition. Your body requires vitamins, minerals, and protein. Vegetables are the best  source of vitamins and minerals. Vegetables also provide the perfect balance of protein. Processed food has little nutritional value, so try to avoid this.        Medications  Resume taking all of your medications unless your doctor or physician assistant tells you not to. If your incision is causing pain, you may take over-the- counter pain relievers such as acetaminophen (Tylenol). If you were prescribed a stronger pain medication, please be aware these medications can cause nausea and constipation. Prevent nausea by taking the medication with a snack or meal. Avoid constipation by drinking plenty of fluids and eating foods with a high amount of fiber, such as fruits, vegetables, and grains. Do not take Tylenol if you are taking prescription pain medications.  Follow Up  Our office will schedule a follow up appointment 2-3 weeks following discharge.  Please call us immediately for any of the following conditions  Increased pain, redness, drainage (pus) from your incision site. Fever of 101 degrees or higher. If you should develop stroke (slurred speech, difficulty swallowing, weakness on one side of your body, loss of vision) you should call 911 and go to the nearest emergency room.  Reduce your risk of vascular disease:  Stop smoking. If you would like help call [facility] at 1-800-QUIT-NOW (1-800-784-8669) or [place] at [phone]. Manage your cholesterol Maintain a desired weight Control your diabetes Keep your blood pressure down  If you have any questions, please call the office at [phone].   

## 2018-09-25 NOTE — Care Management Note (Signed)
Case Management Note Marvetta Gibbons RN, BSN Transitions of Care Unit 4E- RN Case Manager [phone]  Patient Details  Name: [name] MRN: [id number] Date of Birth: 05-18-1934  Subjective/Objective:   Pt admitted s/p CEA                 Action/Plan: PTA pt lived at home, independent, plan to transition back home. No CM needs noted for transition home.   Expected Discharge Date:  09/25/18               Expected Discharge Plan:  Home/Self Care  In-House Referral:  NA  Discharge planning Services  CM Consult  Post Acute Care Choice:  NA Choice offered to:  NA  DME Arranged:    DME Agency:     HH Arranged:    HH Agency:     Status of Service:  Completed, signed off  If discussed at [place] of Stay Meetings, dates discussed:    Discharge Disposition: home/self care   Additional Comments:  Dawayne Patricia, RN 09/25/2018, 10:43 AM

## 2018-09-25 NOTE — Progress Notes (Signed)
Arterial line discontinued this a.m. as per MD order with stable blood pressures throughout the night. Site clean and dry with viewable gauze dressing, palpable radial pulse.

## 2018-10-07 ENCOUNTER — Ambulatory Visit (INDEPENDENT_AMBULATORY_CARE_PROVIDER_SITE_OTHER): Payer: Medicare HMO

## 2018-10-07 DIAGNOSIS — I480 Paroxysmal atrial fibrillation: Secondary | ICD-10-CM

## 2018-10-07 DIAGNOSIS — I428 Other cardiomyopathies: Secondary | ICD-10-CM

## 2018-10-07 NOTE — Progress Notes (Signed)
Remote pacemaker transmission.   

## 2018-10-08 LAB — CUP PACEART REMOTE DEVICE CHECK
Battery Remaining Longevity: 120 mo
Battery Voltage: 3.02 V
Brady Statistic AP VP Percent: 0.1 %
Brady Statistic AP VS Percent: 99.32 %
Brady Statistic AS VP Percent: 0 %
Brady Statistic AS VS Percent: 0.58 %
Brady Statistic RA Percent Paced: 99.41 %
Brady Statistic RV Percent Paced: 0.1 %
Date Time Interrogation Session: 20191230044937
Implantable Lead Implant Date: 20080908
Implantable Lead Implant Date: 20080908
Implantable Lead Implant Date: 20190326
Implantable Lead Location: 753858
Implantable Lead Location: 753860
Implantable Lead Location: 753860
Implantable Lead Model: 3830
Implantable Lead Model: 4194
Implantable Lead Model: 7121
Implantable Pulse Generator Implant Date: 20190326
Lead Channel Impedance Value: 304 Ohm
Lead Channel Impedance Value: 304 Ohm
Lead Channel Impedance Value: 361 Ohm
Lead Channel Impedance Value: 437 Ohm
Lead Channel Impedance Value: 437 Ohm
Lead Channel Impedance Value: 456 Ohm
Lead Channel Impedance Value: 532 Ohm
Lead Channel Impedance Value: 589 Ohm
Lead Channel Impedance Value: 608 Ohm
Lead Channel Sensing Intrinsic Amplitude: 7.875 mV
Lead Channel Sensing Intrinsic Amplitude: 7.875 mV
Lead Channel Sensing Intrinsic Amplitude: 9.875 mV
Lead Channel Sensing Intrinsic Amplitude: 9.875 mV
Lead Channel Setting Pacing Amplitude: 2 V
Lead Channel Setting Pacing Amplitude: 2.5 V
Lead Channel Setting Pacing Pulse Width: 0.4 ms
Lead Channel Setting Sensing Sensitivity: 1.2 mV

## 2018-10-23 ENCOUNTER — Encounter: Payer: Self-pay | Admitting: Vascular Surgery

## 2018-10-23 ENCOUNTER — Ambulatory Visit (INDEPENDENT_AMBULATORY_CARE_PROVIDER_SITE_OTHER): Payer: Medicare HMO | Admitting: Vascular Surgery

## 2018-10-23 ENCOUNTER — Other Ambulatory Visit: Payer: Self-pay

## 2018-10-23 VITALS — BP 143/66 | HR 83 | Temp 97.1°F | Resp 14 | Ht 68.0 in | Wt 180.0 lb

## 2018-10-23 DIAGNOSIS — I6521 Occlusion and stenosis of right carotid artery: Secondary | ICD-10-CM

## 2018-10-23 DIAGNOSIS — Z48812 Encounter for surgical aftercare following surgery on the circulatory system: Secondary | ICD-10-CM

## 2018-10-23 NOTE — Progress Notes (Signed)
Patient name: [name] MRN: [id number] DOB: [birth year] Sex: female  REASON FOR VISIT:   Follow-up after right carotid endarterectomy.  HPI:   [name] is a pleasant 85 y.o. female who I been following with a moderate right carotid stenosis.  This progressed to greater than 80% right carotid endarterectomy was recommended in order to lower her risk of future stroke.  The patient underwent right carotid endarterectomy with bovine pericardial patch angioplasty on 09/24/2018.  Comes in for her first outpatient visit.  She denies any focal weakness or paresthesias.  He is on aspirin and a statin.  Current Outpatient Medications  Medication Sig Dispense Refill  . allopurinol (ZYLOPRIM) 100 MG tablet Take 100 mg by mouth every evening.     Marland Kitchen ALPRAZolam (XANAX) 0.5 MG tablet Take 0.5 mg by mouth at bedtime.     Marland Kitchen amLODipine (NORVASC) 5 MG tablet Take 5 mg by mouth daily.     Marland Kitchen aspirin EC 81 MG EC tablet Take 1 tablet (81 mg total) by mouth daily.    . Coenzyme Q10 200 MG capsule Take 200 mg by mouth daily.    . digoxin (LANOXIN) 0.125 MG tablet Take 0.0625 mg by mouth every evening.     . ezetimibe (ZETIA) 10 MG tablet Take 10 mg by mouth every evening.     . fenofibrate micronized (LOFIBRA) 134 MG capsule Take 134 mg by mouth daily.     . fluticasone (FLONASE) 50 MCG/ACT nasal spray Place 2 sprays into both nostrils daily as needed for allergies or rhinitis.     . furosemide (LASIX) 40 MG tablet Take 40 mg by mouth 2 (two) times daily.     . Insulin Detemir (LEVEMIR FLEXPEN) 100 UNIT/ML Pen Inject 42 Units into the skin at bedtime.     Marland Kitchen levothyroxine (SYNTHROID, LEVOTHROID) 50 MCG tablet Take 50 mcg by mouth daily before breakfast.     . losartan (COZAAR) 100 MG tablet Take 100 mg by mouth daily.     . magnesium oxide (MAG-OX) 400 MG tablet Take 400 mg by mouth 2 (two) times daily.    . Multiple Vitamin (MULTIVITAMIN WITH MINERALS) TABS tablet Take 1 tablet by mouth daily.  Centrum Silver    . nystatin cream (MYCOSTATIN) Apply 1 application topically daily. Mix with triamcinolone  cream & apply to affected area of forehead    . omega-3 acid ethyl esters (LOVAZA) 1 G capsule Take 1 g by mouth 2 (two) times daily.     Marland Kitchen oxyCODONE-acetaminophen (PERCOCET) 7.5-325 MG tablet Take 1 tablet by mouth every 6 (six) hours as needed (back pain.). 12 tablet 0  . pantoprazole (PROTONIX) 40 MG tablet Take 40 mg by mouth 2 (two) times daily.    . pravastatin (PRAVACHOL) 20 MG tablet Take 20 mg by mouth at bedtime.    . Rivaroxaban (XARELTO) 15 MG TABS tablet Take 1 tablet (15 mg total) by mouth daily with supper. (Patient taking differently: Take 15 mg by mouth at bedtime. ) 30 tablet 11  . rOPINIRole (REQUIP) 1 MG tablet Take 1 mg by mouth daily.     . Tetrahydrozoline HCl (VISINE OP) Place 2 drops into both eyes 4 (four) times daily as needed (for dry eyes).     Marland Kitchen tiZANidine (ZANAFLEX) 4 MG tablet Take 4 mg by mouth at bedtime.     . triamcinolone cream (KENALOG) 0.1 % Apply 1 application topically daily. Mix with nystatin cream & apply to affected area  of forehead     No current facility-administered medications for this visit.     REVIEW OF SYSTEMS:  [X]  denotes positive finding, [ ]  denotes negative finding Vascular    Leg swelling    Cardiac    Chest pain or chest pressure:    Shortness of breath upon exertion:    Short of breath when lying flat:    Irregular heart rhythm:    Constitutional    Fever or chills:     PHYSICAL EXAM:   Vitals:   10/23/18 0945 10/23/18 0949  BP: (!) 155/67 (!) 143/66  Pulse: 83 83  Resp: 14   Temp: (!) 97.1 F (36.2 C)   TempSrc: Oral   SpO2: 100%   Weight: 180 lb (81.6 kg)   Height: 5\' 8"  (1.727 m)     GENERAL: The patient is a well-nourished female, in no acute distress. The vital signs are documented above. CARDIOVASCULAR: There is a regular rate and rhythm. PULMONARY: There is good air exchange bilaterally without  wheezing or rales. Her neck incision is healed nicely on the right. NEURO: She has no focal weakness or paresthesias  DATA:   No new data  MEDICAL ISSUES:   STATUS POST RIGHT CAROTID: Patient is doing well status post right carotid endarterectomy.  I ordered a follow-up carotid duplex scan in 9 months she is on aspirin and is on a statin.  She is not a smoker.  She knows to call sooner if she has problems.  Of note she had no significant carotid stenosis preop study.  Deitra Mayo [facility] [place] [phone]

## 2018-11-13 DIAGNOSIS — I110 Hypertensive heart disease with heart failure: Secondary | ICD-10-CM | POA: Diagnosis not present

## 2018-11-13 DIAGNOSIS — E1122 Type 2 diabetes mellitus with diabetic chronic kidney disease: Secondary | ICD-10-CM | POA: Diagnosis not present

## 2018-11-13 DIAGNOSIS — E663 Overweight: Secondary | ICD-10-CM | POA: Diagnosis not present

## 2018-11-13 DIAGNOSIS — I4819 Other persistent atrial fibrillation: Secondary | ICD-10-CM | POA: Diagnosis not present

## 2018-11-13 DIAGNOSIS — N184 Chronic kidney disease, stage 4 (severe): Secondary | ICD-10-CM | POA: Diagnosis not present

## 2018-11-13 DIAGNOSIS — E038 Other specified hypothyroidism: Secondary | ICD-10-CM | POA: Diagnosis not present

## 2018-11-13 DIAGNOSIS — E782 Mixed hyperlipidemia: Secondary | ICD-10-CM | POA: Diagnosis not present

## 2018-11-13 DIAGNOSIS — Z6829 Body mass index (BMI) 29.0-29.9, adult: Secondary | ICD-10-CM | POA: Diagnosis not present

## 2018-11-13 DIAGNOSIS — E1143 Type 2 diabetes mellitus with diabetic autonomic (poly)neuropathy: Secondary | ICD-10-CM | POA: Diagnosis not present

## 2018-11-13 DIAGNOSIS — Z0000 Encounter for general adult medical examination without abnormal findings: Secondary | ICD-10-CM | POA: Diagnosis not present

## 2018-11-20 DIAGNOSIS — S5012XA Contusion of left forearm, initial encounter: Secondary | ICD-10-CM | POA: Diagnosis not present

## 2018-11-20 DIAGNOSIS — R2232 Localized swelling, mass and lump, left upper limb: Secondary | ICD-10-CM | POA: Diagnosis not present

## 2018-11-29 DIAGNOSIS — N959 Unspecified menopausal and perimenopausal disorder: Secondary | ICD-10-CM | POA: Diagnosis not present

## 2018-11-29 DIAGNOSIS — M8580 Other specified disorders of bone density and structure, unspecified site: Secondary | ICD-10-CM | POA: Diagnosis not present

## 2018-11-29 DIAGNOSIS — M85851 Other specified disorders of bone density and structure, right thigh: Secondary | ICD-10-CM | POA: Diagnosis not present

## 2019-01-06 ENCOUNTER — Ambulatory Visit (INDEPENDENT_AMBULATORY_CARE_PROVIDER_SITE_OTHER): Payer: Medicare HMO | Admitting: *Deleted

## 2019-01-06 ENCOUNTER — Other Ambulatory Visit: Payer: Self-pay

## 2019-01-06 DIAGNOSIS — I480 Paroxysmal atrial fibrillation: Secondary | ICD-10-CM | POA: Diagnosis not present

## 2019-01-06 DIAGNOSIS — I428 Other cardiomyopathies: Secondary | ICD-10-CM

## 2019-01-10 LAB — CUP PACEART REMOTE DEVICE CHECK
Battery Remaining Longevity: 117 mo
Battery Voltage: 3.01 V
Brady Statistic AP VP Percent: 0.1 %
Brady Statistic AP VS Percent: 99.69 %
Brady Statistic AS VP Percent: 0 %
Brady Statistic AS VS Percent: 0.22 %
Brady Statistic RA Percent Paced: 99.77 %
Brady Statistic RV Percent Paced: 0.1 %
Date Time Interrogation Session: 20200330141039
Implantable Lead Implant Date: 20080908
Implantable Lead Implant Date: 20080908
Implantable Lead Implant Date: 20190326
Implantable Lead Location: 753858
Implantable Lead Location: 753860
Implantable Lead Location: 753860
Implantable Lead Model: 3830
Implantable Lead Model: 4194
Implantable Lead Model: 7121
Implantable Pulse Generator Implant Date: 20190326
Lead Channel Impedance Value: 285 Ohm
Lead Channel Impedance Value: 285 Ohm
Lead Channel Impedance Value: 342 Ohm
Lead Channel Impedance Value: 399 Ohm
Lead Channel Impedance Value: 437 Ohm
Lead Channel Impedance Value: 456 Ohm
Lead Channel Impedance Value: 532 Ohm
Lead Channel Impedance Value: 551 Ohm
Lead Channel Impedance Value: 570 Ohm
Lead Channel Sensing Intrinsic Amplitude: 8.25 mV
Lead Channel Sensing Intrinsic Amplitude: 8.25 mV
Lead Channel Sensing Intrinsic Amplitude: 9.125 mV
Lead Channel Sensing Intrinsic Amplitude: 9.125 mV
Lead Channel Setting Pacing Amplitude: 2 V
Lead Channel Setting Pacing Amplitude: 2.5 V
Lead Channel Setting Pacing Pulse Width: 0.4 ms
Lead Channel Setting Sensing Sensitivity: 1.2 mV

## 2019-01-14 NOTE — Progress Notes (Signed)
Remote pacemaker transmission.   

## 2019-02-13 DIAGNOSIS — Z6826 Body mass index (BMI) 26.0-26.9, adult: Secondary | ICD-10-CM | POA: Diagnosis not present

## 2019-02-13 DIAGNOSIS — I110 Hypertensive heart disease with heart failure: Secondary | ICD-10-CM | POA: Diagnosis not present

## 2019-02-13 DIAGNOSIS — N184 Chronic kidney disease, stage 4 (severe): Secondary | ICD-10-CM | POA: Diagnosis not present

## 2019-02-13 DIAGNOSIS — I4819 Other persistent atrial fibrillation: Secondary | ICD-10-CM | POA: Diagnosis not present

## 2019-02-13 DIAGNOSIS — E663 Overweight: Secondary | ICD-10-CM | POA: Diagnosis not present

## 2019-02-13 DIAGNOSIS — E1143 Type 2 diabetes mellitus with diabetic autonomic (poly)neuropathy: Secondary | ICD-10-CM | POA: Diagnosis not present

## 2019-02-18 DIAGNOSIS — E782 Mixed hyperlipidemia: Secondary | ICD-10-CM | POA: Diagnosis not present

## 2019-02-18 DIAGNOSIS — I110 Hypertensive heart disease with heart failure: Secondary | ICD-10-CM | POA: Diagnosis not present

## 2019-02-18 DIAGNOSIS — E1142 Type 2 diabetes mellitus with diabetic polyneuropathy: Secondary | ICD-10-CM | POA: Diagnosis not present

## 2019-02-19 DIAGNOSIS — D649 Anemia, unspecified: Secondary | ICD-10-CM | POA: Diagnosis not present

## 2019-02-28 DIAGNOSIS — D509 Iron deficiency anemia, unspecified: Secondary | ICD-10-CM | POA: Diagnosis not present

## 2019-03-14 DIAGNOSIS — J028 Acute pharyngitis due to other specified organisms: Secondary | ICD-10-CM | POA: Diagnosis not present

## 2019-03-27 ENCOUNTER — Telehealth: Payer: Self-pay | Admitting: *Deleted

## 2019-03-27 NOTE — Telephone Encounter (Signed)
..   Virtual Visit Pre-Appointment Phone Call  "(Name), I am calling you today to discuss your upcoming appointment. We are currently trying to limit exposure to the virus that causes COVID-19 by seeing patients at home rather than in the office."  1. "What is the BEST phone number to call the day of the visit?" - include this in appointment notes  2. "Do you have or have access to (through a family member/friend) a smartphone with video capability that we can use for your visit?" a. If yes - list this number in appt notes as "cell" (if different from BEST phone #) and list the appointment type as a VIDEO visit in appointment notes b. If no - list the appointment type as a PHONE visit in appointment notes  3. Confirm consent - "In the setting of the current Covid19 crisis, you are scheduled for a (phone or video) visit with your provider on (date) at (time).  Just as we do with many in-office visits, in order for you to participate in this visit, we must obtain consent.  If you'd like, I can send this to your mychart (if signed up) or email for you to review.  Otherwise, I can obtain your verbal consent now.  All virtual visits are billed to your insurance company just like a normal visit would be.  By agreeing to a virtual visit, we'd like you to understand that the technology does not allow for your provider to perform an examination, and thus may limit your provider's ability to fully assess your condition. If your provider identifies any concerns that need to be evaluated in person, we will make arrangements to do so.  Finally, though the technology is pretty good, we cannot assure that it will always work on either your or our end, and in the setting of a video visit, we may have to convert it to a phone-only visit.  In either situation, we cannot ensure that we have a secure connection.  Are you willing to proceed?" STAFF: Did the patient verbally acknowledge consent to telehealth visit? Document  YES/NO here: YES  4. Advise patient to be prepared - "Two hours prior to your appointment, go ahead and check your blood pressure, pulse, oxygen saturation, and your weight (if you have the equipment to check those) and write them all down. When your visit starts, your provider will ask you for this information. If you have an Apple Watch or Kardia device, please plan to have heart rate information ready on the day of your appointment. Please have a pen and paper handy nearby the day of the visit as well."  5. Give patient instructions for MyChart download to smartphone OR Doximity/Doxy.me as below if video visit (depending on what platform provider is using)PT DOES NOT HAVE A SMARTPHONE SO WILL BE A TELEPHONE CALL VISIT  6. Inform patient they will receive a phone call 15 minutes prior to their appointment time (may be from unknown caller ID) so they should be prepared to answer    [name] been deemed a candidate for a follow-up tele-health visit to limit community exposure during the Covid-19 pandemic. I spoke with the patient via phone to ensure availability of phone/video source, confirm preferred email & phone number, and discuss instructions and expectations.  I reminded [name] to be prepared with any vital sign and/or heart rhythm information that could potentially be obtained via home monitoring, at the time of her  visit. I reminded [name] to expect a phone call prior to her visit.  Juventino Slovak, CMA 03/27/2019 5:09 PM   INSTRUCTIONS FOR DOWNLOADING THE MYCHART APP TO SMARTPHONE  - The patient must first make sure to have activated MyChart and know their login information - If Apple, go to [employer] and type in MyChart in the search bar and download the app. If Android, ask patient to go to [employer] and type in [place] in the search bar and download the app. The app is free but as with any other app downloads, their  phone may require them to verify saved payment information or Apple/Android password.  - The patient will need to then log into the app with their MyChart username and password, and select [facility] as their healthcare provider to link the account. When it is time for your visit, go to the MyChart app, find appointments, and click Begin Video Visit. Be sure to Select Allow for your device to access the Microphone and Camera for your visit. You will then be connected, and your provider will be with you shortly.  **If they have any issues connecting, or need assistance please contact MyChart service desk [phone])**  **If using a computer, in order to ensure the best quality for their visit they will need to use either of the following Internet Browsers: [employer], or Google Chrome**  IF USING DOXIMITY or DOXY.ME - The patient will receive a link just prior to their visit by text.     FULL LENGTH CONSENT FOR TELE-HEALTH VISIT   I hereby voluntarily request, consent and authorize [facility] and its employed or contracted physicians, physician assistants, nurse practitioners or other licensed health care professionals (the Practitioner), to provide me with telemedicine health care services (the "Services") as deemed necessary by the treating Practitioner. I acknowledge and consent to receive the Services by the Practitioner via telemedicine. I understand that the telemedicine visit will involve communicating with the Practitioner through live audiovisual communication technology and the disclosure of certain medical information by electronic transmission. I acknowledge that I have been given the opportunity to request an in-person assessment or other available alternative prior to the telemedicine visit and am voluntarily participating in the telemedicine visit.  I understand that I have the right to withhold or withdraw my consent to the use of telemedicine in the course of  my care at any time, without affecting my right to future care or treatment, and that the Practitioner or I may terminate the telemedicine visit at any time. I understand that I have the right to inspect all information obtained and/or recorded in the course of the telemedicine visit and may receive copies of available information for a reasonable fee.  I understand that some of the potential risks of receiving the Services via telemedicine include:  Marland Kitchen Delay or interruption in medical evaluation due to technological equipment failure or disruption; . Information transmitted may not be sufficient (e.g. poor resolution of images) to allow for appropriate medical decision making by the Practitioner; and/or  . In rare instances, security protocols could fail, causing a breach of personal health information.  Furthermore, I acknowledge that it is my responsibility to provide information about my medical history, conditions and care that is complete and accurate to the best of my ability. I acknowledge that Practitioner's advice, recommendations, and/or decision may be based on factors not within their control, such as incomplete or inaccurate data provided by me  or distortions of diagnostic images or specimens that may result from electronic transmissions. I understand that the practice of medicine is not an exact science and that Practitioner makes no warranties or guarantees regarding treatment outcomes. I acknowledge that I will receive a copy of this consent concurrently upon execution via email to the email address I last provided but may also request a printed copy by calling the office of [facility].    I understand that my insurance will be billed for this visit.   I have read or had this consent read to me. . I understand the contents of this consent, which adequately explains the benefits and risks of the Services being provided via telemedicine.  . I have been provided ample opportunity to ask  questions regarding this consent and the Services and have had my questions answered to my satisfaction. . I give my informed consent for the services to be provided through the use of telemedicine in my medical care  By participating in this telemedicine visit I agree to the above.

## 2019-04-02 ENCOUNTER — Telehealth: Payer: Self-pay | Admitting: Internal Medicine

## 2019-04-02 NOTE — Telephone Encounter (Signed)
New Message    Called and left voicemail for pt to call back to confirm appt

## 2019-04-03 ENCOUNTER — Telehealth (INDEPENDENT_AMBULATORY_CARE_PROVIDER_SITE_OTHER): Payer: Medicare HMO | Admitting: Internal Medicine

## 2019-04-03 ENCOUNTER — Other Ambulatory Visit: Payer: Self-pay

## 2019-04-03 DIAGNOSIS — I482 Chronic atrial fibrillation, unspecified: Secondary | ICD-10-CM | POA: Diagnosis not present

## 2019-04-03 DIAGNOSIS — I5022 Chronic systolic (congestive) heart failure: Secondary | ICD-10-CM | POA: Diagnosis not present

## 2019-04-03 NOTE — Progress Notes (Signed)
Electrophysiology TeleHealth Note   Due to national recommendations of social distancing due to COVID 19, an audio/video telehealth visit is felt to be most appropriate for this patient at this time.  See MyChart message from today for the patient's consent to telehealth for [facility].   Date:  04/03/2019   ID:  [name], DOB [birth year], MRN [id number]  Location: patient's home  Provider location: [address], [place]  Evaluation Performed: Follow-up visit  PCP:  [name], MD  Cardiologist:  Cristopher Peru, MD Electrophysiologist:  Dr Lovena Le  Chief Complaint:  "I am staying in."  History of Present Illness:    [name] is a 85 y.o. female who presents via audio/video conferencing for a telehealth visit today. She has a h/o chronic systolic heart failure, HTN, and diastolic heart failure.  Since last being seen in our clinic, the patient reports doing very well. She notes that she gives out easy. The patient is otherwise without complaint today.  The patient denies symptoms of fevers, chills, cough, or new SOB worrisome for COVID 19.  Past Medical History:  Diagnosis Date  . Amiodarone pulmonary toxicity   . Anemia    no GI bleeding; on iron  . Arthritis   . Atrial fibrillation ([place])   . Biventricular ICD (implantable cardiac defibrillator) in place    BiV ICD for nonischemic cardiomyopathy; BiV responder  . Carotid artery occlusion   . CHF (congestive heart failure) ([place])   . Chronic a-fib    on xarelto; failed DCCV  . Diabetes ([place])   . Fibromyalgia   . Full dentures   . GERD (gastroesophageal reflux disease)   . H/O cardiovascular stress test 08/04/2010   normal imaging; EF 61%  . H/O echocardiogram 07/05/2010   EF 45-50%; aortic valve-mildly sclerotic; LA-mod dilaterd   . Hyperlipemia   . Hypertension   . Hypothalamic disease ([place])    on thyroid supplement  . Hypothyroidism   . LBBB (left bundle branch block)   .  Pneumonia   . PVD (peripheral vascular disease) ([place])    L RA stent 1994  . RAS (renal artery stenosis) ([place])    a. renal dopp (12/18/08)- Right RA-<60%; L RA stent- open; nl size and shape in both kidneys;  b.  Rena Artery [name] (3/16):  SMA with > 70% stenosis, Bilateral prox RA with 1-59%  . Renal insufficiency, mild    05/01/12 Creat 1.47  . Restless leg syndrome   . VT (ventricular tachycardia) ([place]) 9/11   polymorphic VT probably related to sotalol therapy  . Wears glasses     Past Surgical History:  Procedure Laterality Date  . ABDOMINAL HYSTERECTOMY    . APPENDECTOMY    . AV NODE ABLATION  06/08/2006   performed by Dr. Beckie Salts; due to Afib with RVR and tachy brady syndrome  . BIV ICD GENERATOR CHANGEOUT N/A 12/31/2017   Procedure: BIV ICD GENERATOR CHANGEOUT;  Surgeon: Evans Lance, MD;  Location: [facility];  Service: Cardiovascular;  Laterality: N/A;  . BIV ICD GENERTAOR CHANGE OUT  11/15/10   Medtronic Protecta [id number] serial #[id number]  . BiV ICD Placement  06/17/2007; 04/20/14   Medtronic Concerta [id number]; RA lead-Med 5076/53cm, [id number]; RV lead- SJM 7121/65cm, [id number]; LV lead- Med 4194/88cm, [id number]; gen change 04/2014 by Dr Lovena Le  . BIV PACEMAKER GENERATOR CHANGE OUT N/A 04/20/2014   Procedure: BIV PACEMAKER GENERATOR CHANGE OUT;  Surgeon: Evans Lance, MD;  Location: [facility];  Service: Cardiovascular;  Laterality: N/A;  . BRAIN MENINGIOMA EXCISION  2011   L frontal meningi[facility] at [facility]   . BREAST SURGERY     lumpectomy right  . CARDIOVERSION  07/21/05   converted to sinus brady  . CAROTID ENDARTERECTOMY Right 09/24/2018   with bovine pericardial patch angioplasty  . CATARACT EXTRACTION W/ INTRAOCULAR LENS  IMPLANT, BILATERAL    . CHOLECYSTECTOMY     Dr. Sherald Hess in [place] and Dr. Lyda Jester follows; surgical stricture post procedure with stent placement  . COLONOSCOPY W/ BIOPSIES AND POLYPECTOMY    . DILATION AND CURETTAGE OF UTERUS     . ENDARTERECTOMY Right 09/24/2018   Procedure: ENDARTERECTOMY CAROTID RIGHT;  Surgeon: Angelia Mould, MD;  Location: [facility];  Service: Vascular;  Laterality: Right;  . PV Angio  1994   L Renal artery stent     Current Outpatient Medications  Medication Sig Dispense Refill  . allopurinol (ZYLOPRIM) 100 MG tablet Take 100 mg by mouth every evening.     Marland Kitchen ALPRAZolam (XANAX) 0.5 MG tablet Take 0.5 mg by mouth at bedtime.     Marland Kitchen amLODipine (NORVASC) 5 MG tablet Take 5 mg by mouth daily.     Marland Kitchen aspirin EC 81 MG EC tablet Take 1 tablet (81 mg total) by mouth daily.    . Coenzyme Q10 200 MG capsule Take 200 mg by mouth daily.    . digoxin (LANOXIN) 0.125 MG tablet Take 0.0625 mg by mouth every evening.     . ezetimibe (ZETIA) 10 MG tablet Take 10 mg by mouth every evening.     . fenofibrate micronized (LOFIBRA) 134 MG capsule Take 134 mg by mouth daily.     . fluticasone (FLONASE) 50 MCG/ACT nasal spray Place 2 sprays into both nostrils daily as needed for allergies or rhinitis.     . furosemide (LASIX) 40 MG tablet Take 40 mg by mouth 2 (two) times daily.     . Insulin Detemir (LEVEMIR FLEXPEN) 100 UNIT/ML Pen Inject 42 Units into the skin at bedtime.     Marland Kitchen levothyroxine (SYNTHROID, LEVOTHROID) 50 MCG tablet Take 50 mcg by mouth daily before breakfast.     . losartan (COZAAR) 100 MG tablet Take 100 mg by mouth daily.     . magnesium oxide (MAG-OX) 400 MG tablet Take 400 mg by mouth 2 (two) times daily.    . Multiple Vitamin (MULTIVITAMIN WITH MINERALS) TABS tablet Take 1 tablet by mouth daily. Centrum Silver    . nystatin cream (MYCOSTATIN) Apply 1 application topically daily. Mix with triamcinolone  cream & apply to affected area of forehead    . omega-3 acid ethyl esters (LOVAZA) 1 G capsule Take 1 g by mouth 2 (two) times daily.     Marland Kitchen oxyCODONE-acetaminophen (PERCOCET) 7.5-325 MG tablet Take 1 tablet by mouth every 6 (six) hours as needed (back pain.). 12 tablet 0  . pantoprazole  (PROTONIX) 40 MG tablet Take 40 mg by mouth 2 (two) times daily.    . pravastatin (PRAVACHOL) 20 MG tablet Take 20 mg by mouth at bedtime.    . Rivaroxaban (XARELTO) 15 MG TABS tablet Take 1 tablet (15 mg total) by mouth daily with supper. (Patient taking differently: Take 15 mg by mouth at bedtime. ) 30 tablet 11  . rOPINIRole (REQUIP) 1 MG tablet Take 1 mg by mouth daily.     . Tetrahydrozoline HCl (VISINE OP) Place 2 drops into both eyes 4 (  four) times daily as needed (for dry eyes).     Marland Kitchen tiZANidine (ZANAFLEX) 4 MG tablet Take 4 mg by mouth at bedtime.     . triamcinolone cream (KENALOG) 0.1 % Apply 1 application topically daily. Mix with nystatin cream & apply to affected area of forehead     No current facility-administered medications for this visit.     Allergies:   Gabapentin, Pregabalin, Morphine, Penicillins, Tape, Ace inhibitors, Carvedilol, Insulin glargine, Metoprolol, Procaine, Statins, and Lisinopril   Social History:  The patient  reports that she has never smoked. She has never used smokeless tobacco. She reports that she does not drink alcohol or use drugs.   Family History:  The patient's  family history includes AAA (abdominal aortic aneurysm) in her brother; Dementia in her sister; Heart attack in her brother; Heart failure in her mother; Stroke in her maternal grandmother.   ROS:  Please see the history of present illness.   All other systems are personally reviewed and negative.    Exam:    Vital Signs:  bp - 141/65/ P - 72, Wt. - 171 lbs  Well appearing, alert and conversant, regular work of breathing,  good skin color Eyes- anicteric, neuro- grossly intact, skin- no apparent rash or lesions or cyanosis, mouth- oral mucosa is pink   Labs/Other Tests and Data Reviewed:    Recent Labs: 09/24/2018: ALT 17 09/25/2018: BUN 37; Creatinine, Ser 1.82; Hemoglobin 10.7; Platelets 233; Potassium 3.9; Sodium 139   Wt Readings from Last 3 Encounters:  10/23/18 180 lb  (81.6 kg)  09/24/18 194 lb 3.6 oz (88.1 kg)  09/18/18 180 lb (81.6 kg)     Other studies personally reviewed  Last device remote is reviewed from [facility] PDF dated 01/06/19 which reveals normal device function, no arrhythmias    ASSESSMENT & PLAN:    1.  Chronic systolic heart failure - her symptoms are well controlled.  2. Atrial fib - she will continue her current meds. Her rate is controlled. 3. PPM - Her Biv PPM is working normally. We will recheck in several months. 4. COVID 19 screen The patient denies symptoms of COVID 19 at this time.  The importance of social distancing was discussed today.  Follow-up:  6 months Next remote: in a week.  Current medicines are reviewed at length with the patient today.   The patient does not have concerns regarding her medicines.  The following changes were made today:  none  Labs/ tests ordered today include: none No orders of the defined types were placed in this encounter.    Patient Risk:  after full review of this patients clinical status, I feel that they are at moderate risk at this time.  Today, I have spent 15 minutes with the patient with telehealth technology discussing all of the above .    Signed, Cristopher Peru, MD  04/03/2019 12:31 PM     [facility] [address] [place] [address] [phone] (office) [phone] (fax)

## 2019-04-07 ENCOUNTER — Ambulatory Visit (INDEPENDENT_AMBULATORY_CARE_PROVIDER_SITE_OTHER): Payer: Medicare HMO | Admitting: *Deleted

## 2019-04-07 DIAGNOSIS — I480 Paroxysmal atrial fibrillation: Secondary | ICD-10-CM | POA: Diagnosis not present

## 2019-04-07 DIAGNOSIS — I4901 Ventricular fibrillation: Secondary | ICD-10-CM

## 2019-04-08 LAB — CUP PACEART REMOTE DEVICE CHECK
Battery Remaining Longevity: 114 mo
Battery Voltage: 3.01 V
Brady Statistic AP VP Percent: 0.09 %
Brady Statistic AP VS Percent: 99.74 %
Brady Statistic AS VP Percent: 0 %
Brady Statistic AS VS Percent: 0.16 %
Brady Statistic RA Percent Paced: 99.83 %
Brady Statistic RV Percent Paced: 0.1 %
Date Time Interrogation Session: 20200629045148
Implantable Lead Implant Date: 20080908
Implantable Lead Implant Date: 20080908
Implantable Lead Implant Date: 20190326
Implantable Lead Location: 753858
Implantable Lead Location: 753860
Implantable Lead Location: 753860
Implantable Lead Model: 3830
Implantable Lead Model: 4194
Implantable Lead Model: 7121
Implantable Pulse Generator Implant Date: 20190326
Lead Channel Impedance Value: 285 Ohm
Lead Channel Impedance Value: 285 Ohm
Lead Channel Impedance Value: 361 Ohm
Lead Channel Impedance Value: 380 Ohm
Lead Channel Impedance Value: 418 Ohm
Lead Channel Impedance Value: 437 Ohm
Lead Channel Impedance Value: 532 Ohm
Lead Channel Impedance Value: 551 Ohm
Lead Channel Impedance Value: 551 Ohm
Lead Channel Sensing Intrinsic Amplitude: 6.625 mV
Lead Channel Sensing Intrinsic Amplitude: 6.625 mV
Lead Channel Sensing Intrinsic Amplitude: 8.875 mV
Lead Channel Sensing Intrinsic Amplitude: 8.875 mV
Lead Channel Setting Pacing Amplitude: 2 V
Lead Channel Setting Pacing Amplitude: 2.5 V
Lead Channel Setting Pacing Pulse Width: 0.4 ms
Lead Channel Setting Sensing Sensitivity: 1.2 mV

## 2019-04-15 DIAGNOSIS — E119 Type 2 diabetes mellitus without complications: Secondary | ICD-10-CM | POA: Diagnosis not present

## 2019-04-15 DIAGNOSIS — M659 Synovitis and tenosynovitis, unspecified: Secondary | ICD-10-CM | POA: Diagnosis not present

## 2019-04-15 DIAGNOSIS — M76822 Posterior tibial tendinitis, left leg: Secondary | ICD-10-CM | POA: Diagnosis not present

## 2019-04-16 NOTE — Progress Notes (Signed)
Remote pacemaker transmission.   

## 2019-06-23 DIAGNOSIS — Z23 Encounter for immunization: Secondary | ICD-10-CM | POA: Diagnosis not present

## 2019-07-01 DIAGNOSIS — D649 Anemia, unspecified: Secondary | ICD-10-CM | POA: Diagnosis not present

## 2019-07-01 DIAGNOSIS — D509 Iron deficiency anemia, unspecified: Secondary | ICD-10-CM | POA: Diagnosis not present

## 2019-07-08 ENCOUNTER — Ambulatory Visit (INDEPENDENT_AMBULATORY_CARE_PROVIDER_SITE_OTHER): Payer: Medicare HMO | Admitting: *Deleted

## 2019-07-08 DIAGNOSIS — I480 Paroxysmal atrial fibrillation: Secondary | ICD-10-CM | POA: Diagnosis not present

## 2019-07-08 DIAGNOSIS — I4901 Ventricular fibrillation: Secondary | ICD-10-CM

## 2019-07-08 LAB — CUP PACEART REMOTE DEVICE CHECK
Battery Remaining Longevity: 112 mo
Battery Voltage: 3 V
Brady Statistic AP VP Percent: 0.1 %
Brady Statistic AP VS Percent: 99.65 %
Brady Statistic AS VP Percent: 0 %
Brady Statistic AS VS Percent: 0.26 %
Brady Statistic RA Percent Paced: 99.73 %
Brady Statistic RV Percent Paced: 0.1 %
Date Time Interrogation Session: 20200929045104
Implantable Lead Implant Date: 20080908
Implantable Lead Implant Date: 20080908
Implantable Lead Implant Date: 20190326
Implantable Lead Location: 753858
Implantable Lead Location: 753860
Implantable Lead Location: 753860
Implantable Lead Model: 3830
Implantable Lead Model: 4194
Implantable Lead Model: 7121
Implantable Pulse Generator Implant Date: 20190326
Lead Channel Impedance Value: 266 Ohm
Lead Channel Impedance Value: 285 Ohm
Lead Channel Impedance Value: 323 Ohm
Lead Channel Impedance Value: 399 Ohm
Lead Channel Impedance Value: 418 Ohm
Lead Channel Impedance Value: 437 Ohm
Lead Channel Impedance Value: 551 Ohm
Lead Channel Impedance Value: 551 Ohm
Lead Channel Impedance Value: 551 Ohm
Lead Channel Sensing Intrinsic Amplitude: 6.625 mV
Lead Channel Sensing Intrinsic Amplitude: 6.625 mV
Lead Channel Sensing Intrinsic Amplitude: 7.375 mV
Lead Channel Sensing Intrinsic Amplitude: 7.375 mV
Lead Channel Setting Pacing Amplitude: 2 V
Lead Channel Setting Pacing Amplitude: 2.5 V
Lead Channel Setting Pacing Pulse Width: 0.4 ms
Lead Channel Setting Sensing Sensitivity: 1.2 mV

## 2019-07-15 ENCOUNTER — Telehealth: Payer: Self-pay

## 2019-07-15 NOTE — Telephone Encounter (Signed)
I called pt in reguards to her letter she sent me. Her letter states she could not send a manual transmission. I was calling to help her with that. I looked in [employer] and it looks like her monitor automatically transmits. I left a message for the pt to call me on my direct office number.

## 2019-07-15 NOTE — Telephone Encounter (Signed)
I told the [name] that her monitor automatically sends and she do not have to send a manual transmission unless we ask for one. The [name] also wanted to set up her appointment with Dr. Lovena Le. I got that appointment set up for her. I gave the [name] my direct office number to call me if she has any questions pertaining to her ICD or monitor.

## 2019-07-18 NOTE — Progress Notes (Signed)
Remote pacemaker transmission.   

## 2019-07-30 DIAGNOSIS — H524 Presbyopia: Secondary | ICD-10-CM | POA: Diagnosis not present

## 2019-07-30 DIAGNOSIS — E119 Type 2 diabetes mellitus without complications: Secondary | ICD-10-CM | POA: Diagnosis not present

## 2019-07-30 DIAGNOSIS — H04123 Dry eye syndrome of bilateral lacrimal glands: Secondary | ICD-10-CM | POA: Diagnosis not present

## 2019-07-30 DIAGNOSIS — Z961 Presence of intraocular lens: Secondary | ICD-10-CM | POA: Diagnosis not present

## 2019-09-30 ENCOUNTER — Encounter: Payer: Medicare HMO | Admitting: Internal Medicine

## 2019-10-01 ENCOUNTER — Encounter (HOSPITAL_COMMUNITY): Payer: Medicare HMO

## 2019-10-01 ENCOUNTER — Ambulatory Visit: Payer: Medicare HMO | Admitting: Vascular Surgery

## 2019-10-07 ENCOUNTER — Ambulatory Visit (INDEPENDENT_AMBULATORY_CARE_PROVIDER_SITE_OTHER): Payer: Medicare HMO | Admitting: *Deleted

## 2019-10-07 DIAGNOSIS — I480 Paroxysmal atrial fibrillation: Secondary | ICD-10-CM

## 2019-10-07 LAB — CUP PACEART REMOTE DEVICE CHECK
Battery Remaining Longevity: 108 mo
Battery Voltage: 3 V
Brady Statistic AP VP Percent: 0.09 %
Brady Statistic AP VS Percent: 99.58 %
Brady Statistic AS VP Percent: 0 %
Brady Statistic AS VS Percent: 0.32 %
Brady Statistic RA Percent Paced: 99.67 %
Brady Statistic RV Percent Paced: 0.1 %
Date Time Interrogation Session: 20201228235252
Implantable Lead Implant Date: 20080908
Implantable Lead Implant Date: 20080908
Implantable Lead Implant Date: 20190326
Implantable Lead Location: 753858
Implantable Lead Location: 753860
Implantable Lead Location: 753860
Implantable Lead Model: 3830
Implantable Lead Model: 4194
Implantable Lead Model: 7121
Implantable Pulse Generator Implant Date: 20190326
Lead Channel Impedance Value: 304 Ohm
Lead Channel Impedance Value: 304 Ohm
Lead Channel Impedance Value: 342 Ohm
Lead Channel Impedance Value: 456 Ohm
Lead Channel Impedance Value: 475 Ohm
Lead Channel Impedance Value: 494 Ohm
Lead Channel Impedance Value: 532 Ohm
Lead Channel Impedance Value: 646 Ohm
Lead Channel Impedance Value: 665 Ohm
Lead Channel Sensing Intrinsic Amplitude: 6.625 mV
Lead Channel Sensing Intrinsic Amplitude: 6.625 mV
Lead Channel Sensing Intrinsic Amplitude: 9.875 mV
Lead Channel Sensing Intrinsic Amplitude: 9.875 mV
Lead Channel Setting Pacing Amplitude: 2 V
Lead Channel Setting Pacing Amplitude: 2.5 V
Lead Channel Setting Pacing Pulse Width: 0.4 ms
Lead Channel Setting Sensing Sensitivity: 1.2 mV

## 2019-10-28 ENCOUNTER — Other Ambulatory Visit: Payer: Self-pay

## 2019-10-28 ENCOUNTER — Telehealth (HOSPITAL_COMMUNITY): Payer: Self-pay

## 2019-10-28 DIAGNOSIS — I6521 Occlusion and stenosis of right carotid artery: Secondary | ICD-10-CM

## 2019-10-28 NOTE — Telephone Encounter (Signed)

## 2019-10-29 ENCOUNTER — Other Ambulatory Visit: Payer: Self-pay

## 2019-10-29 ENCOUNTER — Ambulatory Visit: Payer: Medicare HMO | Admitting: Vascular Surgery

## 2019-10-29 ENCOUNTER — Ambulatory Visit (HOSPITAL_COMMUNITY)
Admission: RE | Admit: 2019-10-29 | Discharge: 2019-10-29 | Disposition: A | Discharge status: Home / Self Care | Payer: Medicare HMO | Source: Ambulatory Visit | Attending: Surgery | Admitting: Surgery

## 2019-10-29 DIAGNOSIS — I6521 Occlusion and stenosis of right carotid artery: Secondary | ICD-10-CM | POA: Insufficient documentation

## 2019-11-05 ENCOUNTER — Encounter: Payer: Self-pay | Admitting: Vascular Surgery

## 2019-11-05 ENCOUNTER — Ambulatory Visit (INDEPENDENT_AMBULATORY_CARE_PROVIDER_SITE_OTHER): Payer: Medicare HMO | Admitting: Vascular Surgery

## 2019-11-05 ENCOUNTER — Other Ambulatory Visit: Payer: Self-pay

## 2019-11-05 DIAGNOSIS — Z48812 Encounter for surgical aftercare following surgery on the circulatory system: Secondary | ICD-10-CM | POA: Diagnosis not present

## 2019-11-05 DIAGNOSIS — Z79899 Other long term (current) drug therapy: Secondary | ICD-10-CM

## 2019-11-05 DIAGNOSIS — Z7982 Long term (current) use of aspirin: Secondary | ICD-10-CM

## 2019-11-05 DIAGNOSIS — I6521 Occlusion and stenosis of right carotid artery: Secondary | ICD-10-CM

## 2019-11-05 NOTE — Progress Notes (Signed)
Patient name: [name] DOB: 1934-01-27 Sex: female    Referring Provider is Cox, Elnita Maxwell, MD  PCP is Cox, Elnita Maxwell, MD  REASON FOR VIRTUAL VISIT: Follow-up of carotid disease.  I connected with [name] on 11/05/19 at  3:40 PM EST by a video enabled telemedicine application and verified that I am speaking with the correct person using two identifiers. I discussed the limitations of evaluation and management by telemedicine and the availability of in person appointments. The patient expressed understanding and agreed to proceed.  Location: Patient: Home Provider: Office  HPI: [name] is a 86 y.o. female who I last saw on 10/23/2018.  I have been following her with a moderate right carotid stenosis that progressed to greater than 80%.  The patient underwent a right carotid endarterectomy with bovine pericardial patch angioplasty on 09/24/2018.  At the time of her last visit she was on aspirin and was on a statin.  She was doing well and was set up for a follow-up duplex scan in 9 months.  Since I saw her last she has been doing well except for some arthritis in her knee.  She denies any history of stroke, TIAs, expressive or receptive aphasia, or amaurosis fugax.  She is on aspirin and is on a statin.  Current Outpatient Medications  Medication Sig Dispense Refill  . allopurinol (ZYLOPRIM) 100 MG tablet Take 100 mg by mouth every evening.     Marland Kitchen ALPRAZolam (XANAX) 0.5 MG tablet Take 0.5 mg by mouth at bedtime.     Marland Kitchen amLODipine (NORVASC) 5 MG tablet Take 5 mg by mouth daily.     . Aspirin Buf,CaCarb-MgCarb-MgO, 81 MG TABS Take by mouth.    Marland Kitchen aspirin EC 81 MG EC tablet Take 1 tablet (81 mg total) by mouth daily.    . Coenzyme Q10 200 MG capsule Take 200 mg by mouth daily.    . digoxin (LANOXIN) 0.125 MG tablet Take 0.0625 mg by mouth every evening.     . ezetimibe (ZETIA) 10 MG tablet Take 10 mg by mouth every evening.     .  fenofibrate micronized (LOFIBRA) 134 MG capsule Take 134 mg by mouth daily.     . fluticasone (FLONASE) 50 MCG/ACT nasal spray Place 2 sprays into both nostrils daily as needed for allergies or rhinitis.     . furosemide (LASIX) 40 MG tablet Take 40 mg by mouth 2 (two) times daily.     Marland Kitchen levothyroxine (SYNTHROID, LEVOTHROID) 50 MCG tablet Take 50 mcg by mouth daily before breakfast.     . losartan (COZAAR) 100 MG tablet Take 100 mg by mouth daily.     . magnesium oxide (MAG-OX) 400 MG tablet Take 400 mg by mouth 2 (two) times daily.    . Multiple Vitamin (MULTIVITAMIN WITH MINERALS) TABS tablet Take 1 tablet by mouth daily. Centrum Silver    . nystatin cream (MYCOSTATIN) Apply 1 application topically daily. Mix with triamcinolone  cream & apply to affected area of forehead    . omega-3 acid ethyl esters (LOVAZA) 1 G capsule Take 1 g by mouth 2 (two) times daily.     Marland Kitchen oxyCODONE-acetaminophen (PERCOCET) 7.5-325 MG tablet Take 1 tablet by mouth every 6 (six) hours as needed (back pain.). 12 tablet 0  . pantoprazole (PROTONIX) 40 MG tablet Take 40 mg by mouth 2 (two) times daily.    . pravastatin (PRAVACHOL) 20 MG tablet Take 20 mg by mouth at  bedtime.    . Rivaroxaban (XARELTO) 15 MG TABS tablet Take 1 tablet (15 mg total) by mouth daily with supper. (Patient taking differently: Take 15 mg by mouth at bedtime. ) 30 tablet 11  . rOPINIRole (REQUIP) 1 MG tablet Take 1 mg by mouth daily.     . Tetrahydrozoline HCl (VISINE OP) Place 2 drops into both eyes 4 (four) times daily as needed (for dry eyes).     Marland Kitchen tiZANidine (ZANAFLEX) 4 MG tablet Take 4 mg by mouth at bedtime.     . triamcinolone cream (KENALOG) 0.1 % Apply 1 application topically daily. Mix with nystatin cream & apply to affected area of forehead    . Insulin Detemir (LEVEMIR FLEXPEN) 100 UNIT/ML Pen Inject 42 Units into the skin at bedtime.      No current facility-administered medications for this visit.   REVIEW OF SYSTEMS: Valu.Nieves ] denotes  positive finding; [  ] denotes negative finding  CARDIOVASCULAR:  [ ]  chest pain   [ ]  dyspnea on exertion  [ ]  leg swelling  CONSTITUTIONAL:  [ ]  fever   [ ]  chills   OBSERVATIONS/OBJECTIVE: There were no vitals filed for this visit. The patient was alert and oriented on the phone. She did not appear short of breath.  DATA:  CAROTID DUPLEX: I have reviewed her carotid duplex scan that was done on 10/29/2019.  On the right side, her right carotid endarterectomy site is widely patent.  There is no evidence of recurrent stenosis.  The right vertebral artery is patent with antegrade flow.  On the left side there is a less than 39% stenosis.  The left vertebral artery is patent with antegrade flow.  MEDICAL ISSUES:  STATUS POST RIGHT CAROTID ENDARTERECTOMY: Patient is doing well status post right carotid endarterectomy.  The right carotid endarterectomy site is widely patent.  There is no evidence of left carotid stenosis.  She is on aspirin and is on a statin.  I have ordered a follow-up carotid duplex scan in 1 year and I will see her back at that time.  She knows to call sooner if she has problems.  FOLLOW UP INSTRUCTIONS:   I discussed the assessment and treatment plan with the patient. The patient was provided an opportunity to ask questions and all were answered. The patient agreed with the plan and demonstrated an understanding of the instructions. The patient was advised to call back or seek an in-person evaluation if the symptoms worsen or if the condition fails to improve as anticipated.  I provided 12 minutes of non-face-to-face time during this encounter.   Deitra Mayo [facility]

## 2019-11-06 ENCOUNTER — Other Ambulatory Visit: Payer: Self-pay | Admitting: *Deleted

## 2019-11-06 DIAGNOSIS — I6521 Occlusion and stenosis of right carotid artery: Secondary | ICD-10-CM

## 2019-11-11 ENCOUNTER — Other Ambulatory Visit: Payer: Self-pay | Admitting: Family Medicine

## 2019-11-11 ENCOUNTER — Other Ambulatory Visit: Payer: Self-pay

## 2019-11-11 MED ORDER — OXYCODONE-ACETAMINOPHEN 7.5-325 MG PO TABS: 1.0000 | ORAL_TABLET | Freq: Four times a day (QID) | ORAL | 0 refills | Status: DC | PRN

## 2019-11-17 ENCOUNTER — Other Ambulatory Visit: Payer: Self-pay

## 2019-11-17 ENCOUNTER — Other Ambulatory Visit: Payer: Self-pay | Admitting: Family Medicine

## 2019-11-17 MED ORDER — OXYCODONE-ACETAMINOPHEN 7.5-325 MG PO TABS: 1.0000 | ORAL_TABLET | Freq: Four times a day (QID) | ORAL | 0 refills | Status: DC | PRN

## 2019-11-17 NOTE — Patient Instructions (Addendum)
Health Maintenance, Female Adopting a healthy lifestyle and getting preventive care are important in promoting health and wellness. Ask your health care provider about:  The right schedule for you to have regular tests and exams.  Things you can do on your own to prevent diseases and keep yourself healthy. What should I know about diet, weight, and exercise? Eat a healthy diet   Eat a diet that includes plenty of vegetables, fruits, low-fat dairy products, and lean protein.  Do not eat a lot of foods that are high in solid fats, added sugars, or sodium. Maintain a healthy weight Body mass index (BMI) is used to identify weight problems. It estimates body fat based on height and weight. Your health care provider can help determine your BMI and help you achieve or maintain a healthy weight. Get regular exercise Get regular exercise. This is one of the most important things you can do for your health. Most adults should:  Exercise for at least 150 minutes each week. The exercise should increase your heart rate and make you sweat (moderate-intensity exercise).  Do strengthening exercises at least twice a week. This is in addition to the moderate-intensity exercise.  Spend less time sitting. Even light physical activity can be beneficial. Watch cholesterol and blood lipids Have your blood tested for lipids and cholesterol at 84 years of age, then have this test every 5 years. Have your cholesterol levels checked more often if:  Your lipid or cholesterol levels are high.  You are older than 84 years of age.  You are at high risk for heart disease. What should I know about cancer screening? Depending on your health history and family history, you may need to have cancer screening at various ages. This may include screening for:  Breast cancer.  Cervical cancer.  Colorectal cancer.  Skin cancer.  Lung cancer. What should I know about heart disease, diabetes, and high blood  pressure? Blood pressure and heart disease  High blood pressure causes heart disease and increases the risk of stroke. This is more likely to develop in people who have high blood pressure readings, are of African descent, or are overweight.  Have your blood pressure checked: ? Every 3-5 years if you are 18-39 years of age. ? Every year if you are 40 years old or older. Diabetes Have regular diabetes screenings. This checks your fasting blood sugar level. Have the screening done:  Once every three years after age 40 if you are at a normal weight and have a low risk for diabetes.  More often and at a younger age if you are overweight or have a high risk for diabetes. What should I know about preventing infection? Hepatitis B If you have a higher risk for hepatitis B, you should be screened for this virus. Talk with your health care provider to find out if you are at risk for hepatitis B infection. Hepatitis C Testing is recommended for:  Everyone born from 1945 through 1965.  Anyone with known risk factors for hepatitis C. Sexually transmitted infections (STIs)  Get screened for STIs, including gonorrhea and chlamydia, if: ? You are sexually active and are younger than 84 years of age. ? You are older than 84 years of age and your health care provider tells you that you are at risk for this type of infection. ? Your sexual activity has changed since you were last screened, and you are at increased risk for chlamydia or gonorrhea. Ask your health care provider if   you are at risk.  Ask your health care provider about whether you are at high risk for HIV. Your health care provider may recommend a prescription medicine to help prevent HIV infection. If you choose to take medicine to prevent HIV, you should first get tested for HIV. You should then be tested every 3 months for as long as you are taking the medicine. Pregnancy  If you are about to stop having your period (premenopausal) and  you may become pregnant, seek counseling before you get pregnant.  Take 400 to 800 micrograms (mcg) of folic acid every day if you become pregnant.  Ask for birth control (contraception) if you want to prevent pregnancy. Osteoporosis and menopause Osteoporosis is a disease in which the bones lose minerals and strength with aging. This can result in bone fractures. If you are 44 years old or older, or if you are at risk for osteoporosis and fractures, ask your health care provider if you should:  Be screened for bone loss.  Take a calcium or vitamin D supplement to lower your risk of fractures.  Be given hormone replacement therapy (HRT) to treat symptoms of menopause. Follow these instructions at home: Lifestyle  Do not use any products that contain nicotine or tobacco, such as cigarettes, e-cigarettes, and chewing tobacco. If you need help quitting, ask your health care provider.  Do not use street drugs.  Do not share needles.  Ask your health care provider for help if you need support or information about quitting drugs. Alcohol use  Do not drink alcohol if: ? Your health care provider tells you not to drink. ? You are pregnant, may be pregnant, or are planning to become pregnant.  If you drink alcohol: ? Limit how much you use to 0-1 drink a day. ? Limit intake if you are breastfeeding.  Be aware of how much alcohol is in your drink. In the [place] one drink equals one 12 oz bottle of beer (355 mL), one 5 oz glass of wine (148 mL), or one 1 oz glass of hard liquor (44 mL). General instructions  Schedule regular health, dental, and eye exams.  Stay current with your vaccines.  Tell your health care provider if: ? You often feel depressed. ? You have ever been abused or do not feel safe at home. Summary  Adopting a healthy lifestyle and getting preventive care are important in promoting health and wellness.  Follow your health care provider's instructions about healthy  diet, exercising, and getting tested or screened for diseases.  Follow your health care provider's instructions on monitoring your cholesterol and blood pressure. This information is not intended to replace advice given to you by your health care provider. Make sure you discuss any questions you have with your health care provider. Document Revised: 09/18/2018 Document Reviewed: 09/18/2018 Elsevier Patient Education  [place] Prevention in the Home, Adult Falls can cause injuries. They can happen to people of all ages. There are many things you can do to make your home safe and to help prevent falls. Ask for help when making these changes, if needed. What actions can I take to prevent falls? General Instructions  Use good lighting in all rooms. Replace any light bulbs that burn out.  Turn on the lights when you go into a dark area. Use night-lights.  Keep items that you use often in easy-to-reach places. Lower the shelves around your home if necessary.  Set up your furniture so you  have a clear path. Avoid moving your furniture around.  Do not have throw rugs and other things on the floor that can make you trip.  Avoid walking on wet floors.  If any of your floors are uneven, fix them.  Add color or contrast paint or tape to clearly mark and help you see: ? Any grab bars or handrails. ? First and last steps of stairways. ? Where the edge of each step is.  If you use a stepladder: ? Make sure that it is fully opened. Do not climb a closed stepladder. ? Make sure that both sides of the stepladder are locked into place. ? Ask someone to hold the stepladder for you while you use it.  If there are any pets around you, be aware of where they are. What can I do in the bathroom?      Keep the floor dry. Clean up any water that spills onto the floor as soon as it happens.  Remove soap buildup in the tub or shower regularly.  Use non-skid mats or decals on the  floor of the tub or shower.  Attach bath mats securely with double-sided, non-slip rug tape.  If you need to sit down in the shower, use a plastic, non-slip stool.  Install grab bars by the toilet and in the tub and shower. Do not use towel bars as grab bars. What can I do in the bedroom?  Make sure that you have a light by your bed that is easy to reach.  Do not use any sheets or blankets that are too big for your bed. They should not hang down onto the floor.  Have a firm chair that has side arms. You can use this for support while you get dressed. What can I do in the kitchen?  Clean up any spills right away.  If you need to reach something above you, use a strong step stool that has a grab bar.  Keep electrical cords out of the way.  Do not use floor polish or wax that makes floors slippery. If you must use wax, use non-skid floor wax. What can I do with my stairs?  Do not leave any items on the stairs.  Make sure that you have a light switch at the top of the stairs and the bottom of the stairs. If you do not have them, ask someone to add them for you.  Make sure that there are handrails on both sides of the stairs, and use them. Fix handrails that are broken or loose. Make sure that handrails are as long as the stairways.  Install non-slip stair treads on all stairs in your home.  Avoid having throw rugs at the top or bottom of the stairs. If you do have throw rugs, attach them to the floor with carpet tape.  Choose a carpet that does not hide the edge of the steps on the stairway.  Check any carpeting to make sure that it is firmly attached to the stairs. Fix any carpet that is loose or worn. What can I do on the outside of my home?  Use bright outdoor lighting.  Regularly fix the edges of walkways and driveways and fix any cracks.  Remove anything that might make you trip as you walk through a door, such as a raised step or threshold.  Trim any bushes or trees on  the path to your home.  Regularly check to see if handrails are loose or broken. Make  sure that both sides of any steps have handrails.  Install guardrails along the edges of any raised decks and porches.  Clear walking paths of anything that might make someone trip, such as tools or rocks.  Have any leaves, snow, or ice cleared regularly.  Use sand or salt on walking paths during winter.  Clean up any spills in your garage right away. This includes grease or oil spills. What other actions can I take?  Wear shoes that: ? Have a low heel. Do not wear high heels. ? Have rubber bottoms. ? Are comfortable and fit you well. ? Are closed at the toe. Do not wear open-toe sandals.  Use tools that help you move around (mobility aids) if they are needed. These include: ? Canes. ? Walkers. ? Scooters. ? Crutches.  Review your medicines with your doctor. Some medicines can make you feel dizzy. This can increase your chance of falling. Ask your doctor what other things you can do to help prevent falls. Where to find more information  Centers for Disease Control and Prevention, [facility]: https://garcia.biz/  [employer] on Aging: BrainJudge.co.uk Contact a doctor if:  You are afraid of falling at home.  You feel weak, drowsy, or dizzy at home.  You fall at home. Summary  There are many simple things that you can do to make your home safe and to help prevent falls.  Ways to make your home safe include removing tripping hazards and installing grab bars in the bathroom.  Ask for help when making these changes in your home. This information is not intended to replace advice given to you by your health care provider. Make sure you discuss any questions you have with your health care provider. Document Revised: 01/16/2019 Document Reviewed: 05/10/2017 Elsevier Patient Education  2020 [place] Maintenance, Female Adopting a healthy lifestyle and getting  preventive care are important in promoting health and wellness. Ask your health care provider about:  The right schedule for you to have regular tests and exams.  Things you can do on your own to prevent diseases and keep yourself healthy. What should I know about diet, weight, and exercise? Eat a healthy diet   Eat a diet that includes plenty of vegetables, fruits, low-fat dairy products, and lean protein.  Do not eat a lot of foods that are high in solid fats, added sugars, or sodium. Maintain a healthy weight Body mass index (BMI) is used to identify weight problems. It estimates body fat based on height and weight. Your health care provider can help determine your BMI and help you achieve or maintain a healthy weight. Get regular exercise Get regular exercise. This is one of the most important things you can do for your health. Most adults should:  Exercise for at least 150 minutes each week. The exercise should increase your heart rate and make you sweat (moderate-intensity exercise).  Do strengthening exercises at least twice a week. This is in addition to the moderate-intensity exercise.  Spend less time sitting. Even light physical activity can be beneficial. Watch cholesterol and blood lipids Have your blood tested for lipids and cholesterol at 86 years of age, then have this test every 5 years. Have your cholesterol levels checked more often if:  Your lipid or cholesterol levels are high.  You are older than 84 years of age.  You are at high risk for heart disease. What should I know about cancer screening? Depending on your health history and family  history, you may need to have cancer screening at various ages. This may include screening for:  Breast cancer.  Cervical cancer.  Colorectal cancer.  Skin cancer.  Lung cancer. What should I know about heart disease, diabetes, and high blood pressure? Blood pressure and heart disease  High blood pressure causes  heart disease and increases the risk of stroke. This is more likely to develop in people who have high blood pressure readings, are of African descent, or are overweight.  Have your blood pressure checked: ? Every 3-5 years if you are 61-75 years of age. ? Every year if you are 82 years old or older. Diabetes Have regular diabetes screenings. This checks your fasting blood sugar level. Have the screening done:  Once every three years after age 88 if you are at a normal weight and have a low risk for diabetes.  More often and at a younger age if you are overweight or have a high risk for diabetes. What should I know about preventing infection? Hepatitis B If you have a higher risk for hepatitis B, you should be screened for this virus. Talk with your health care provider to find out if you are at risk for hepatitis B infection. Hepatitis C Testing is recommended for:  Everyone born from [birth year] through 1965.  Anyone with known risk factors for hepatitis C. Sexually transmitted infections (STIs)  Get screened for STIs, including gonorrhea and chlamydia, if: ? You are sexually active and are younger than 84 years of age. ? You are older than 84 years of age and your health care provider tells you that you are at risk for this type of infection. ? Your sexual activity has changed since you were last screened, and you are at increased risk for chlamydia or gonorrhea. Ask your health care provider if you are at risk.  Ask your health care provider about whether you are at high risk for HIV. Your health care provider may recommend a prescription medicine to help prevent HIV infection. If you choose to take medicine to prevent HIV, you should first get tested for HIV. You should then be tested every 3 months for as long as you are taking the medicine. Pregnancy  If you are about to stop having your period (premenopausal) and you may become pregnant, seek counseling before you get pregnant.  Take  400 to 800 micrograms (mcg) of folic acid every day if you become pregnant.  Ask for birth control (contraception) if you want to prevent pregnancy. Osteoporosis and menopause Osteoporosis is a disease in which the bones lose minerals and strength with aging. This can result in bone fractures. If you are 73 years old or older, or if you are at risk for osteoporosis and fractures, ask your health care provider if you should:  Be screened for bone loss.  Take a calcium or vitamin D supplement to lower your risk of fractures.  Be given hormone replacement therapy (HRT) to treat symptoms of menopause. Follow these instructions at home: Lifestyle  Do not use any products that contain nicotine or tobacco, such as cigarettes, e-cigarettes, and chewing tobacco. If you need help quitting, ask your health care provider.  Do not use street drugs.  Do not share needles.  Ask your health care provider for help if you need support or information about quitting drugs. Alcohol use  Do not drink alcohol if: ? Your health care provider tells you not to drink. ? You are pregnant, may be pregnant, or  are planning to become pregnant.  If you drink alcohol: ? Limit how much you use to 0-1 drink a day. ? Limit intake if you are breastfeeding.  Be aware of how much alcohol is in your drink. In the [place] one drink equals one 12 oz bottle of beer (355 mL), one 5 oz glass of wine (148 mL), or one 1 oz glass of hard liquor (44 mL). General instructions  Schedule regular health, dental, and eye exams.  Stay current with your vaccines.  Tell your health care provider if: ? You often feel depressed. ? You have ever been abused or do not feel safe at home. Summary  Adopting a healthy lifestyle and getting preventive care are important in promoting health and wellness.  Follow your health care provider's instructions about healthy diet, exercising, and getting tested or screened for diseases.  Follow  your health care provider's instructions on monitoring your cholesterol and blood pressure. This information is not intended to replace advice given to you by your health care provider. Make sure you discuss any questions you have with your health care provider. Document Revised: 09/18/2018 Document Reviewed: 09/18/2018 Elsevier Patient Education  [place] A mammogram is a low energy X-ray of the breasts that is done to check for abnormal changes. This procedure can screen for and detect any changes that may indicate breast cancer. Mammograms are regularly done on women. A man may have a mammogram if he has a lump or swelling in his breast. A mammogram can also identify other changes and variations in the breast, such as:  Inflammation of the breast tissue (mastitis).  An infected area that contains a collection of pus (abscess).  A fluid-filled sac (cyst).  Fibrocystic changes. This is when breast tissue becomes denser, which can make the tissue feel rope-like or uneven under the skin.  Tumors that are not cancerous (benign). Tell a health care provider:  About any allergies you have.  If you have breast implants.  If you have had previous breast disease, biopsy, or surgery.  If you are breastfeeding.  If you are younger than age 29.  If you have a family history of breast cancer.  Whether you are pregnant or may be pregnant. What are the risks? Generally, this is a safe procedure. However, problems may occur, including:  Exposure to radiation. Radiation levels are very low with this test.  The results being misinterpreted.  The need for further tests.  The inability of the mammogram to detect certain cancers. What happens before the procedure?  Schedule your test about 1-2 weeks after your menstrual period if you are still menstruating. This is usually when your breasts are the least tender.  If you have had a mammogram done at a different  facility in the past, get the mammogram X-rays or have them sent to your current exam facility. The new and old images will be compared.  Wash your breasts and underarms on the day of the test.  Do not wear deodorants, perfumes, lotions, or powders anywhere on your body on the day of the test.  Remove any jewelry from your neck.  Wear clothes that you can change into and out of easily. What happens during the procedure?   You will undress from the waist up and put on a gown that opens in the front.  You will stand in front of the X-ray machine.  Each breast will be placed between two plastic or glass plates. The  plates will compress your breast for a few seconds. Try to stay as relaxed as possible during the procedure. This does not cause any harm to your breasts and any discomfort you feel will be very brief.  X-rays will be taken from different angles of each breast. The procedure may vary among health care providers and hospitals. What happens after the procedure?  The mammogram will be examined by a specialist (radiologist).  You may need to repeat certain parts of the test, depending on the quality of the images. This is commonly done if the radiologist needs a better view of the breast tissue.  You may resume your normal activities.  It is up to you to get the results of your procedure. Ask your health care provider, or the department that is doing the procedure, when your results will be ready. Summary  A mammogram is a low energy X-ray of the breasts that is done to check for abnormal changes. A man may have a mammogram if he has a lump or swelling in his breast.  If you have had a mammogram done at a different facility in the past, get the mammogram X-rays or have them sent to your current exam facility in order to compare them.  Schedule your test about 1-2 weeks after your menstrual period if you are still menstruating.  For this test, each breast will be placed  between two plastic or glass plates. The plates will compress your breast for a few seconds.  Ask when your test results will be ready. Make sure you get your test results. This information is not intended to replace advice given to you by your health care provider. Make sure you discuss any questions you have with your health care provider. Document Revised: 05/16/2018 Document Reviewed: 05/16/2018 Elsevier Patient Education  2020 [place] you for enrolling in [facility]. Please follow the instructions below to securely access your online medical record. MyChart allows you to send messages to your doctor, view your test results, manage appointments, and more.   How Do I Sign Up? 1. In your Internet browser, go to [employer] and enter [email or web address]. 2. Click on the Sign Up Now link in the Sign In box. You will see the New Member Sign Up page. 3. Enter your MyChart Access Code exactly as it appears below. You will not need to use this code after you've completed the sign-up process. If you do not sign up before the expiration date, you must request a new code.  MyChart Access Code: [email or web address] Expires: 01/02/2020  3:19 AM  4. Enter your Social Security Number ([email or web address]) and Date of Birth (mm/dd/yyyy) as indicated and click Submit. You will be taken to the next sign-up page. 5. Create a MyChart ID. This will be your MyChart login ID and cannot be changed, so think of one that is secure and easy to remember. 6. Create a MyChart password. You can change your password at any time. 7. Enter your Password Reset Question and Answer. This can be used at a later time if you forget your password.  8. Enter your e-mail address. You will receive e-mail notification when new information is available in [place]. 9. Click Sign Up. You can now view your medical record.   Additional Information Remember, MyChart is NOT to be used for urgent needs. For medical  emergencies, dial 911.

## 2019-11-18 ENCOUNTER — Telehealth: Payer: Self-pay

## 2019-11-18 ENCOUNTER — Other Ambulatory Visit: Payer: Self-pay

## 2019-11-18 ENCOUNTER — Ambulatory Visit (INDEPENDENT_AMBULATORY_CARE_PROVIDER_SITE_OTHER): Payer: Medicare HMO

## 2019-11-18 VITALS — BP 120/62 | HR 72 | Temp 97.2°F | Ht 68.0 in | Wt 168.0 lb

## 2019-11-18 DIAGNOSIS — Z0000 Encounter for general adult medical examination without abnormal findings: Secondary | ICD-10-CM

## 2019-11-18 NOTE — Progress Notes (Signed)
Subjective:   [name] is a 86 y.o. female who presents for Medicare Annual (Subsequent) preventive examination. This wellness visit is conducted by a nurse.  The patient's medications were reviewed and reconciled since the patient's last visit.  History details were provided by the patient.  The history appears to be reliable.    Patient's last AWV was 1 year ago.   Medical History: Patient history and Family history was reviewed  Medications, Allergies, and preventative health maintenance was reviewed and updated.  Review of Systems:  Review of Systems  Constitutional: Negative.   HENT: Negative.   Respiratory: Negative.   Cardiovascular: Negative for chest pain and palpitations.  Gastrointestinal: Negative.   Musculoskeletal: Positive for arthralgias, back pain and joint swelling.  Neurological: Negative.   Psychiatric/Behavioral: Negative.          Objective:     Vitals: BP 120/62   Pulse 72   Temp (!) 97.2 F (36.2 C) (Oral)   Ht 5\' 8"  (1.727 m)   Wt 168 lb (76.2 kg)   BMI 25.54 kg/m   Body mass index is 25.54 kg/m.  Advanced Directives 11/18/2019 09/24/2018 08/21/2018 08/05/2018 12/31/2017 04/20/2014  Does Patient Have a Medical Advance Directive? No No No No Yes Patient does not have advance directive;Patient would not like information  Type of Advance Directive - - - - [place];Living will -  Does patient want to make changes to medical advance directive? - - - - No - Patient declined -  Copy of [place] in Chart? - - - - No - copy requested -  Would patient like information on creating a medical advance directive? Yes (MAU/Ambulatory/Procedural Areas - Information given) No - Patient declined - No - Patient declined - -   Patient is working with her daughter on creating advance directives.  She will bring a copy for her chart once completed.  Tobacco Social History   Tobacco Use  Smoking Status Never  Smoker  Smokeless Tobacco Never Used      Clinical Intake:  Pre-visit preparation completed: Yes  Pain : 0-10 Pain Score: 2  Pain Type: Chronic pain Pain Location: Back Pain Frequency: Intermittent Pain Relieving Factors: muscle rub, patches  Pain Relieving Factors: muscle rub, patches  BMI - recorded: 25.54 Nutritional Status: BMI 25 -29 Overweight Nutritional Risks: None Diabetes: Yes CBG done?: No Did pt. bring in CBG monitor from home?: No  Home blood sugar reported: Fasting average 90's, Evening average: 160's  How often do you need to have someone help you when you read instructions, pamphlets, or other written materials from your doctor or pharmacy?: 1 - Never  Interpreter Needed?: No     Past Medical History:  Diagnosis Date  . Amiodarone pulmonary toxicity   . Anemia    no GI bleeding; on iron  . Arthritis   . Atrial fibrillation ([place])   . Biventricular ICD (implantable cardiac defibrillator) in place    BiV ICD for nonischemic cardiomyopathy; BiV responder  . Carotid artery occlusion   . CHF (congestive heart failure) ([place])   . Chronic a-fib ([place])    on xarelto; failed DCCV  . Diabetes ([place])   . Fibromyalgia   . Full dentures   . GERD (gastroesophageal reflux disease)   . H/O cardiovascular stress test 08/04/2010   normal imaging; EF 61%  . H/O echocardiogram 07/05/2010   EF 45-50%; aortic valve-mildly sclerotic; LA-mod dilaterd   . Hyperlipemia   .  Hypertension   . Hypothalamic disease ([place])    on thyroid supplement  . Hypothyroidism   . LBBB (left bundle branch block)   . Pneumonia   . PVD (peripheral vascular disease) ([place])    L RA stent 1994  . RAS (renal artery stenosis) ([place])    a. renal dopp (12/18/08)- Right RA-<60%; L RA stent- open; nl size and shape in both kidneys;  b.  Rena Artery [name] (3/16):  SMA with > 70% stenosis, Bilateral prox RA with 1-59%  . Renal insufficiency, mild    05/01/12 Creat 1.47  . Restless leg syndrome   . VT  (ventricular tachycardia) ([place]) 9/11   polymorphic VT probably related to sotalol therapy  . Wears glasses    Past Surgical History:  Procedure Laterality Date  . ABDOMINAL HYSTERECTOMY    . APPENDECTOMY    . AV NODE ABLATION  06/08/2006   performed by Dr. Beckie Salts; due to Afib with RVR and tachy brady syndrome  . BIV ICD GENERATOR CHANGEOUT N/A 12/31/2017   Procedure: BIV ICD GENERATOR CHANGEOUT;  Surgeon: Evans Lance, MD;  Location: [facility];  Service: Cardiovascular;  Laterality: N/A;  . BIV ICD GENERTAOR CHANGE OUT  11/15/10   Medtronic Protecta [id number] serial #[id number]  . BiV ICD Placement  06/17/2007; 04/20/14   Medtronic Concerta [id number]; RA lead-Med 5076/53cm, [id number]; RV lead- SJM 7121/65cm, [id number]; LV lead- Med 4194/88cm, [id number]; gen change 04/2014 by Dr Lovena Le  . BIV PACEMAKER GENERATOR CHANGE OUT N/A 04/20/2014   Procedure: BIV PACEMAKER GENERATOR CHANGE OUT;  Surgeon: Evans Lance, MD;  [facility];  Service: Cardiovascular;  Laterality: N/A;  . BRAIN MENINGIOMA EXCISION  2011   L frontal [facility] at [facility]   . BREAST SURGERY     lumpectomy right  . CARDIOVERSION  07/21/05   converted to sinus brady  . CAROTID ENDARTERECTOMY Right 09/24/2018   with bovine pericardial patch angioplasty  . CATARACT EXTRACTION W/ INTRAOCULAR LENS  IMPLANT, BILATERAL    . CHOLECYSTECTOMY     Dr. Sherald Hess in [place] and Dr. Lyda Jester follows; surgical stricture post procedure with stent placement  . COLONOSCOPY W/ BIOPSIES AND POLYPECTOMY    . DILATION AND CURETTAGE OF UTERUS    . ENDARTERECTOMY Right 09/24/2018   Procedure: ENDARTERECTOMY CAROTID RIGHT;  Surgeon: Angelia Mould, MD;  Location: [place];  Service: Vascular;  Laterality: Right;  . PV Angio  1994   L Renal artery stent    Family History  Problem Relation Age of Onset  . Heart failure Mother   . Dementia Sister   . Heart attack Brother   . AAA (abdominal aortic aneurysm)  Brother   . Stroke Maternal Grandmother    Social History   Socioeconomic History  . Marital status: Married    Spouse name: Not on file  . Number of children: Not on file  . Years of education: Not on file  . Highest education level: Not on file  Occupational History  . Not on file  Tobacco Use  . Smoking status: Never Smoker  . Smokeless tobacco: Never Used  Substance and Sexual Activity  . Alcohol use: No    Alcohol/week: 0.0 standard drinks  . Drug use: No  . Sexual activity: Not Currently  Other Topics Concern  . Not on file  Social History Narrative  . Not on file   Social Determinants of Health   Financial Resource Strain:   . Difficulty of Paying  Living Expenses: Not on file  Food Insecurity:   . Worried About Charity fundraiser in the Last Year: Not on file  . Ran Out of Food in the Last Year: Not on file  Transportation Needs:   . Lack of Transportation (Medical): Not on file  . Lack of Transportation (Non-Medical): Not on file  Physical Activity:   . Days of Exercise per Week: Not on file  . Minutes of Exercise per Session: Not on file  Stress:   . Feeling of Stress : Not on file  Social Connections:   . Frequency of Communication with Friends and Family: Not on file  . Frequency of Social Gatherings with Friends and Family: Not on file  . Attends Religious Services: Not on file  . Active Member of Clubs or Organizations: Not on file  . Attends Archivist Meetings: Not on file  . Marital Status: Not on file    Outpatient Encounter Medications as of 11/18/2019  Medication Sig  . allopurinol (ZYLOPRIM) 100 MG tablet Take 100 mg by mouth every evening.   Marland Kitchen ALPRAZolam (XANAX) 0.5 MG tablet Take 0.5 mg by mouth at bedtime.   Marland Kitchen amLODipine (NORVASC) 5 MG tablet Take 5 mg by mouth daily.   Marland Kitchen aspirin EC 81 MG EC tablet Take 1 tablet (81 mg total) by mouth daily.  . Coenzyme Q10 200 MG capsule Take 200 mg by mouth daily.  . digoxin (LANOXIN) 0.125 MG  tablet Take 0.0625 mg by mouth every evening.   . ezetimibe (ZETIA) 10 MG tablet Take 1 tablet (10 mg total) by mouth daily.  . fenofibrate micronized (LOFIBRA) 134 MG capsule Take 134 mg by mouth daily.   . fluticasone (FLONASE) 50 MCG/ACT nasal spray Place 2 sprays into both nostrils daily as needed for allergies or rhinitis.   . furosemide (LASIX) 40 MG tablet Take 40 mg by mouth 2 (two) times daily.   Marland Kitchen levothyroxine (SYNTHROID, LEVOTHROID) 50 MCG tablet Take 50 mcg by mouth daily before breakfast.   . losartan (COZAAR) 100 MG tablet Take 100 mg by mouth daily.   . magnesium oxide (MAG-OX) 400 MG tablet Take 400 mg by mouth 2 (two) times daily.  . Multiple Vitamin (MULTIVITAMIN WITH MINERALS) TABS tablet Take 1 tablet by mouth daily. Centrum Silver  . nystatin cream (MYCOSTATIN) Apply 1 application topically daily. Mix with triamcinolone  cream & apply to affected area of forehead  . omega-3 acid ethyl esters (LOVAZA) 1 G capsule Take 1 g by mouth 2 (two) times daily.   Marland Kitchen oxyCODONE-acetaminophen (PERCOCET) 7.5-325 MG tablet Take 1 tablet by mouth every 6 (six) hours as needed (back pain.).  Marland Kitchen pravastatin (PRAVACHOL) 20 MG tablet Take 20 mg by mouth at bedtime.  . Rivaroxaban (XARELTO) 15 MG TABS tablet Take 1 tablet (15 mg total) by mouth daily with supper. (Patient taking differently: Take 15 mg by mouth at bedtime. )  . rOPINIRole (REQUIP) 1 MG tablet Take 1 mg by mouth daily.   . Tetrahydrozoline HCl (VISINE OP) Place 2 drops into both eyes 4 (four) times daily as needed (for dry eyes).   Marland Kitchen tiZANidine (ZANAFLEX) 4 MG tablet Take 4 mg by mouth at bedtime.   . triamcinolone cream (KENALOG) 0.1 % Apply 1 application topically daily. Mix with nystatin cream & apply to affected area of forehead  . Aspirin Buf,CaCarb-MgCarb-MgO, 81 MG TABS Take by mouth.  . Insulin Detemir (LEVEMIR FLEXPEN) 100 UNIT/ML Pen Inject 42 Units into  the skin at bedtime.   . pantoprazole (PROTONIX) 40 MG tablet Take 40  mg by mouth 2 (two) times daily.  . [DISCONTINUED] ezetimibe (ZETIA) 10 MG tablet Take 10 mg by mouth every evening.   . [DISCONTINUED] oxyCODONE-acetaminophen (PERCOCET) 7.5-325 MG tablet Take 1 tablet by mouth every 6 (six) hours as needed (back pain.).  . [DISCONTINUED] oxyCODONE-acetaminophen (PERCOCET) 7.5-325 MG tablet Take 1 tablet by mouth every 6 (six) hours as needed (back pain.).   No facility-administered encounter medications on file as of 11/18/2019.    Activities of Daily Living In your present state of health, do you have any difficulty performing the following activities: 11/18/2019  Hearing? N  Vision? N  Difficulty concentrating or making decisions? N  Walking or climbing stairs? N  Dressing or bathing? N  Doing errands, shopping? N  Preparing Food and eating ? N  Using the Toilet? N  In the past six months, have you accidently leaked urine? Y  Do you have problems with loss of bowel control? N  Managing your Medications? N  Managing your Finances? N  Housekeeping or managing your Housekeeping? N  Some recent data might be hidden    Other Healthcare Providers: Dr Lovena Le, Cardiology Dr Gretta Arab, Podiatry      Assessment:   This is a routine wellness examination for [place].  Exercise Activities and Dietary recommendations Current Exercise Habits: The patient does not participate in regular exercise at present, Exercise limited by: orthopedic condition(s)  Goals    . Follow up with Primary Care Provider       Fall Risk Fall Risk  11/18/2019  Falls in the past year? 0   Is the patient's home free of loose throw rugs in walkways, pet beds, electrical cords, etc?   yes      Grab bars in the bathroom? no      Handrails on the stairs?   yes      Adequate lighting?   yes    Depression Screen PHQ 2/9 Scores 11/18/2019  PHQ - 2 Score 0        Immunization History  Administered Date(s) Administered  . Influenza-Unspecified 06/23/2019  . Pneumococcal  Conjugate-13 10/05/2014  . Pneumococcal Polysaccharide-23 06/11/2013  . Tdap 05/28/2017   Patient educated about COVID-19 Vaccine.  She declines at this time.  Screening Tests Health Maintenance  Topic Date Due  . CHRONIC VISIT/LABWORK DUE  . OPHTHALMOLOGY EXAM  09/2020  . HEMOGLOBIN A1C  DUE  . TETANUS/TDAP  05/29/2027  . INFLUENZA VACCINE  Completed  . DEXA SCAN  Completed  . PNA vac Low Risk Adult  Completed    Cancer Screenings: Lung: Low Dose CT Chest recommended if Age 77-80 years, 30 pack-year currently smoking OR have quit w/in 15years. Patient does not qualify. Breast:  Up to date on Mammogram? No  Up to date of Bone Density/Dexa? Yes Colorectal: Up to date      Plan:  Mrs. Sinyard agrees to bring in a copy of her Advance Directive once completed for our files. Her last eye exam records have been requested from [facility].  I have personally reviewed and noted the following in the patient's chart:   . Medical and social history . Use of alcohol, tobacco or illicit drugs  . Current medications and supplements . Functional ability and status . Nutritional status . Physical activity . Advanced directives . List of other physicians . Hospitalizations, surgeries, and ER visits in previous 12 months . Vitals .  Screenings to include cognitive, depression, and falls . Referrals and appointments  In addition, I have reviewed and discussed with patient certain preventive protocols, quality metrics, and best practice recommendations. A written personalized care plan for preventive services as well as general preventive health recommendations were provided to patient.     Erie Noe, LPN  01/16/8263

## 2019-11-18 NOTE — Telephone Encounter (Signed)
Agree with patient needing an appointment. Thank you, Dr. Tobie Poet

## 2019-11-18 NOTE — Telephone Encounter (Signed)
[name] states that she has stopped taking her Levemir as her fasting blood sugar runs from 89-104.  I am making her an appointment for a chronic fasting appointment as she has not had one since 02/2019. Her A1C was 6.7 at that time.  She states that she has lost weight since then as well.

## 2019-11-19 ENCOUNTER — Telehealth: Payer: Self-pay | Admitting: Internal Medicine

## 2019-11-19 NOTE — Telephone Encounter (Signed)
New message   Pt wants to know if her appt can be virtual with Dr. Lovena Le on 02.15.21

## 2019-11-24 ENCOUNTER — Telehealth (INDEPENDENT_AMBULATORY_CARE_PROVIDER_SITE_OTHER): Payer: Medicare HMO | Admitting: Internal Medicine

## 2019-11-24 ENCOUNTER — Encounter: Payer: Self-pay | Admitting: Internal Medicine

## 2019-11-24 ENCOUNTER — Other Ambulatory Visit: Payer: Self-pay

## 2019-11-24 VITALS — BP 136/59 | HR 70 | Ht 68.0 in | Wt 168.0 lb

## 2019-11-24 DIAGNOSIS — I482 Chronic atrial fibrillation, unspecified: Secondary | ICD-10-CM | POA: Diagnosis not present

## 2019-11-24 DIAGNOSIS — I5022 Chronic systolic (congestive) heart failure: Secondary | ICD-10-CM

## 2019-11-24 NOTE — Progress Notes (Signed)
Electrophysiology TeleHealth Note   Due to national recommendations of social distancing due to COVID 19, an audio/video telehealth visit is felt to be most appropriate for this patient at this time.  See MyChart message from today for the patient's consent to telehealth for [facility].   Date:  11/24/2019   ID:  [name], DOB 10-14-1933, MRN [id number]  Location: patient's home  Provider location: [address] [place] [address] [place]  Evaluation Performed: Follow-up visit  PCP:  [name], MD  Cardiologist:  No primary care provider on file.  Electrophysiologist:  Dr Lovena Le  Chief Complaint:  "my blood sugar has been has been lower."  History of Present Illness:    [name] is a 86 y.o. female who presents via audio/video conferencing for a telehealth visit today. She has a h/o atrial fib and RVR, s/p AV node ablation and recent PPM insertion with a left bundle pacer lead in placed  Since last being seen in our clinic, the patient reports doing very well except for some arthritis. She has not gone out much out of concerns for Covid 19.  Today, she denies symptoms of palpitations, chest pain, shortness of breath,  lower extremity edema, dizziness, presyncope, or syncope.  The patient is otherwise without complaint today.  The patient denies symptoms of fevers, chills, cough, or new SOB worrisome for COVID 19.  Past Medical History:  Diagnosis Date  . Amiodarone pulmonary toxicity   . Anemia    no GI bleeding; on iron  . Arthritis   . Atrial fibrillation ([place])   . Biventricular ICD (implantable cardiac defibrillator) in place    BiV ICD for nonischemic cardiomyopathy; BiV responder  . Carotid artery occlusion   . CHF (congestive heart failure) ([place])   . Chronic a-fib ([place])    on xarelto; failed DCCV  . Diabetes ([place])   . Fibromyalgia   . Full dentures   . GERD (gastroesophageal reflux disease)   . H/O cardiovascular stress test  08/04/2010   normal imaging; EF 61%  . H/O echocardiogram 07/05/2010   EF 45-50%; aortic valve-mildly sclerotic; LA-mod dilaterd   . Hyperlipemia   . Hypertension   . Hypothalamic disease ([place])    on thyroid supplement  . Hypothyroidism   . LBBB (left bundle branch block)   . Pneumonia   . PVD (peripheral vascular disease) ([place])    L RA stent 1994  . RAS (renal artery stenosis) ([place])    a. renal dopp (12/18/08)- Right RA-<60%; L RA stent- open; nl size and shape in both kidneys;  b.  Rena Artery [name] (3/16):  SMA with > 70% stenosis, Bilateral prox RA with 1-59%  . Renal insufficiency, mild    05/01/12 Creat 1.47  . Restless leg syndrome   . VT (ventricular tachycardia) ([place]) 9/11   polymorphic VT probably related to sotalol therapy  . Wears glasses     Past Surgical History:  Procedure Laterality Date  . ABDOMINAL HYSTERECTOMY    . APPENDECTOMY    . AV NODE ABLATION  06/08/2006   performed by Dr. Beckie Salts; due to Afib with RVR and tachy brady syndrome  . BIV ICD GENERATOR CHANGEOUT N/A 12/31/2017   Procedure: BIV ICD GENERATOR CHANGEOUT;  Surgeon: Evans Lance, MD;  Location: [place] [facility];  Service: Cardiovascular;  Laterality: N/A;  . BIV ICD GENERTAOR CHANGE OUT  11/15/10   Medtronic Protecta [id number] serial #[id number]  . BiV ICD  Placement  06/17/2007; 04/20/14   Medtronic Concerta [id number]; RA lead-Med 5076/53cm, [id number]; RV lead- SJM 7121/65cm, [id number]; LV lead- Med 4194/88cm, [id number]; gen change 04/2014 by Dr Lovena Le  . BIV PACEMAKER GENERATOR CHANGE OUT N/A 04/20/2014   Procedure: BIV PACEMAKER GENERATOR CHANGE OUT;  Surgeon: Evans Lance, MD;  Location: [facility] CATH LAB;  Service: Cardiovascular;  Laterality: N/A;  . BRAIN MENINGIOMA EXCISION  2011   L frontal meningioma at [facility]   . BREAST SURGERY     lumpectomy right  . CARDIOVERSION  07/21/05   converted to sinus brady  . CAROTID ENDARTERECTOMY Right 09/24/2018   with bovine pericardial patch angioplasty  .  CATARACT EXTRACTION W/ INTRAOCULAR LENS  IMPLANT, BILATERAL    . CHOLECYSTECTOMY     Dr. Sherald Hess in [place] and Dr. Lyda Jester follows; surgical stricture post procedure with stent placement  . COLONOSCOPY W/ BIOPSIES AND POLYPECTOMY    . DILATION AND CURETTAGE OF UTERUS    . ENDARTERECTOMY Right 09/24/2018   Procedure: ENDARTERECTOMY CAROTID RIGHT;  Surgeon: Angelia Mould, MD;  Location: [place];  Service: Vascular;  Laterality: Right;  . PV Angio  1994   L Renal artery stent     Current Outpatient Medications  Medication Sig Dispense Refill  . allopurinol (ZYLOPRIM) 100 MG tablet Take 100 mg by mouth every evening.     Marland Kitchen ALPRAZolam (XANAX) 0.5 MG tablet Take 0.5 mg by mouth at bedtime.     Marland Kitchen amLODipine (NORVASC) 5 MG tablet Take 5 mg by mouth daily.     . Aspirin Buf,CaCarb-MgCarb-MgO, 81 MG TABS Take by mouth.    Marland Kitchen aspirin EC 81 MG EC tablet Take 1 tablet (81 mg total) by mouth daily.    . Coenzyme Q10 200 MG capsule Take 200 mg by mouth daily.    . digoxin (LANOXIN) 0.125 MG tablet Take 0.0625 mg by mouth every evening.     . ezetimibe (ZETIA) 10 MG tablet Take 1 tablet (10 mg total) by mouth daily. 90 tablet 3  . fenofibrate micronized (LOFIBRA) 134 MG capsule Take 134 mg by mouth daily.     . fluticasone (FLONASE) 50 MCG/ACT nasal spray Place 2 sprays into both nostrils daily as needed for allergies or rhinitis.     . furosemide (LASIX) 40 MG tablet Take 40 mg by mouth 2 (two) times daily.     . Insulin Detemir (LEVEMIR FLEXPEN) 100 UNIT/ML Pen Inject 42 Units into the skin at bedtime.     Marland Kitchen levothyroxine (SYNTHROID, LEVOTHROID) 50 MCG tablet Take 50 mcg by mouth daily before breakfast.     . losartan (COZAAR) 100 MG tablet Take 100 mg by mouth daily.     . magnesium oxide (MAG-OX) 400 MG tablet Take 400 mg by mouth 2 (two) times daily.    . Multiple Vitamin (MULTIVITAMIN WITH MINERALS) TABS tablet Take 1 tablet by mouth daily. Centrum Silver    . nystatin cream  (MYCOSTATIN) Apply 1 application topically daily. Mix with triamcinolone  cream & apply to affected area of forehead    . omega-3 acid ethyl esters (LOVAZA) 1 G capsule Take 1 g by mouth 2 (two) times daily.     Marland Kitchen oxyCODONE-acetaminophen (PERCOCET) 7.5-325 MG tablet Take 1 tablet by mouth every 6 (six) hours as needed (back pain.). 120 tablet 0  . pantoprazole (PROTONIX) 40 MG tablet Take 40 mg by mouth 2 (two) times daily.    . pravastatin (PRAVACHOL) 20 MG tablet Take 20 mg by  mouth at bedtime.    . Rivaroxaban (XARELTO) 15 MG TABS tablet Take 1 tablet (15 mg total) by mouth daily with supper. 30 tablet 11  . rOPINIRole (REQUIP) 1 MG tablet Take 1 mg by mouth daily.     . Tetrahydrozoline HCl (VISINE OP) Place 2 drops into both eyes 4 (four) times daily as needed (for dry eyes).     Marland Kitchen tiZANidine (ZANAFLEX) 4 MG tablet Take 4 mg by mouth at bedtime.     . triamcinolone cream (KENALOG) 0.1 % Apply 1 application topically daily. Mix with nystatin cream & apply to affected area of forehead     No current facility-administered medications for this visit.    Allergies:   Gabapentin, Pregabalin, Morphine, Penicillins, Tape, Ace inhibitors, Carvedilol, Insulin glargine, Metoprolol, Procaine, Statins, and Lisinopril   Social History:  The patient  reports that she has never smoked. She has never used smokeless tobacco. She reports that she does not drink alcohol or use drugs.   Family History:  The patient's family history includes AAA (abdominal aortic aneurysm) in her brother; Dementia in her sister; Heart attack in her brother; Heart failure in her mother; Stroke in her maternal grandmother.   ROS:  Please see the history of present illness.   All other systems are personally reviewed and negative.    Exam:    Vital Signs:  BP (!) 136/59   Pulse 70   Ht 5\' 8"  (1.727 m)   Wt 168 lb (76.2 kg)   BMI 25.54 kg/m   Well appearing, alert and conversant  Labs/Other Tests and Data Reviewed:     Recent Labs: No results found for requested labs within last 8760 hours.   Wt Readings from Last 3 Encounters:  11/24/19 168 lb (76.2 kg)  11/18/19 168 lb (76.2 kg)  10/23/18 180 lb (81.6 kg)     Other studies personally reviewed: Additional studies/ records that were reviewed today include:   Last device remote is reviewed from [facility] PDF dated 12/20 which reveals normal device function, no arrhythmias except for chronic atrial fib.   ASSESSMENT & PLAN:    1.  Atrial fib - she is well controlled, s/p AV node ablation. 2. PPM - interrogation of her device demonstrates normal device function. 3. Chronic systolic heart failure - her symptoms remain class 2. She will continue her current meds.  Tinnie Kunin,M.D.   COVID 19 screen The patient denies symptoms of COVID 19 at this time.  The importance of social distancing was discussed today.  Follow-up:  6 months Next remote: 3/21  Current medicines are reviewed at length with the patient today.   The patient does not have concerns regarding her medicines.  The following changes were made today:  none  Labs/ tests ordered today include: none No orders of the defined types were placed in this encounter.    Patient Risk:  after full review of this patients clinical status, I feel that they are at moderate risk at this time.  Today, I have spent 15 minutes with the patient with telehealth technology discussing all of the above .    Signed, Cristopher Peru, MD  11/24/2019 2:39 PM     [place] [address] [place] [address] [phone] (office) [phone] (fax)

## 2019-11-28 ENCOUNTER — Ambulatory Visit: Payer: Medicare HMO | Admitting: Family Medicine

## 2019-12-01 ENCOUNTER — Encounter: Payer: Self-pay | Admitting: Family Medicine

## 2019-12-01 ENCOUNTER — Ambulatory Visit (INDEPENDENT_AMBULATORY_CARE_PROVIDER_SITE_OTHER): Payer: Medicare HMO | Admitting: Family Medicine

## 2019-12-01 ENCOUNTER — Other Ambulatory Visit: Payer: Self-pay

## 2019-12-01 VITALS — BP 150/68 | HR 100 | Temp 98.0°F | Resp 18 | Ht 66.0 in | Wt 171.4 lb

## 2019-12-01 DIAGNOSIS — M1712 Unilateral primary osteoarthritis, left knee: Secondary | ICD-10-CM | POA: Insufficient documentation

## 2019-12-01 DIAGNOSIS — E0842 Diabetes mellitus due to underlying condition with diabetic polyneuropathy: Secondary | ICD-10-CM | POA: Insufficient documentation

## 2019-12-01 HISTORY — DX: Diabetes mellitus due to underlying condition with diabetic polyneuropathy: E08.42

## 2019-12-01 HISTORY — DX: Unilateral primary osteoarthritis, left knee: M17.12

## 2019-12-01 NOTE — Assessment & Plan Note (Signed)
Patient recommended to restart levemir at 5 U before supper. Patient can anticipate an increase in her sugars over the next few days.

## 2019-12-01 NOTE — Progress Notes (Signed)
Acute Office Visit  Subjective:    Patient ID: [name], female    DOB: 26-May-1934, 86 y.o.   MRN: [id number]  Chief Complaint  Patient presents with  . Knee Pain    HPI Patient is in today for persistent and worsening knee pain. She has tylenol and percocet.  She is requested a knee injection  The patient is due for diabetes visit soon.  Her sugars have been low in the morning between 70 and 90 and in the evening they will range anywhere from 160 up to 200.  The patient has discontinued her insulin and her glimepiride because her sugars were severely low.  Since that time she is concerned because her sugars in the evenings still continue to be elevated.   Past Medical History:  Diagnosis Date  . Amiodarone pulmonary toxicity   . Anemia    no GI bleeding; on iron  . Arthritis   . Atrial fibrillation ([place])   . Biventricular ICD (implantable cardiac defibrillator) in place    BiV ICD for nonischemic cardiomyopathy; BiV responder  . Carotid artery occlusion   . CHF (congestive heart failure) ([place])   . Chronic a-fib ([place])    on xarelto; failed DCCV  . Chronic kidney disease, stage 4 (severe) ([place])   . Diabetes ([place])   . Fibromyalgia   . Full dentures   . GAD (generalized anxiety disorder)   . GERD (gastroesophageal reflux disease)   . H/O cardiovascular stress test 08/04/2010   normal imaging; EF 61%  . H/O echocardiogram 07/05/2010   EF 45-50%; aortic valve-mildly sclerotic; LA-mod dilaterd   . Hyperlipemia   . Hypertension   . Hypertensive heart disease with heart failure ([place])   . Hypothalamic disease ([place])    on thyroid supplement  . Hypothyroidism   . Hypothyroidism   . LBBB (left bundle branch block)   . Mixed hyperlipidemia   . Occlusion and stenosis of right carotid artery   . Other persistent atrial fibrillation ([place])   . Pneumonia   . Presence of automatic (implantable) cardiac defibrillator   . PVD (peripheral vascular disease) ([place])      L RA stent 1994  . RAS (renal artery stenosis) ([place])    a. renal dopp (12/18/08)- Right RA-<60%; L RA stent- open; nl size and shape in both kidneys;  b.  Rena Artery [name] (3/16):  SMA with > 70% stenosis, Bilateral prox RA with 1-59%  . Renal insufficiency, mild    05/01/12 Creat 1.47  . Restless leg syndrome   . Type 2 diabetes mellitus with diabetic neuropathy ([place])   . VT (ventricular tachycardia) ([place]) 9/11   polymorphic VT probably related to sotalol therapy  . Wears glasses     Past Surgical History:  Procedure Laterality Date  . ABDOMINAL HYSTERECTOMY    . APPENDECTOMY    . AV NODE ABLATION  06/08/2006   performed by Dr. Beckie Salts; due to Afib with RVR and tachy brady syndrome  . BIV ICD GENERATOR CHANGEOUT N/A 12/31/2017   Procedure: BIV ICD GENERATOR CHANGEOUT;  Surgeon: Evans Lance, MD;  Location: [place];  Service: Cardiovascular;  Laterality: N/A;  . BIV ICD GENERTAOR CHANGE OUT  11/15/10   Medtronic Protecta [id number] serial #[id number]  . BiV ICD Placement  06/17/2007; 04/20/14   Medtronic Concerta [id number]; RA lead-Med 5076/53cm, [id number]; RV lead- SJM 7121/65cm, [id number]; LV lead- Med 4194/88cm, [id number]; gen change 04/2014 by Dr Lovena Le  . BIV PACEMAKER  GENERATOR CHANGE OUT N/A 04/20/2014   Procedure: BIV PACEMAKER GENERATOR CHANGE OUT;  Surgeon: Evans Lance, MD;  Location: [facility] CATH LAB;  Service: Cardiovascular;  Laterality: N/A;  . BRAIN MENINGIOMA EXCISION  2011   L frontal meningioma at [facility]   . BREAST SURGERY     lumpectomy right  . CARDIOVERSION  07/21/05   converted to sinus brady  . CAROTID ENDARTERECTOMY Right 09/24/2018   with bovine pericardial patch angioplasty  . CATARACT EXTRACTION W/ INTRAOCULAR LENS  IMPLANT, BILATERAL    . CHOLECYSTECTOMY     Dr. Sherald Hess in [place] and Dr. Lyda Jester follows; surgical stricture post procedure with stent placement  . COLONOSCOPY W/ BIOPSIES AND POLYPECTOMY    . DILATION AND CURETTAGE OF  UTERUS    . ENDARTERECTOMY Right 09/24/2018   Procedure: ENDARTERECTOMY CAROTID RIGHT;  Surgeon: Angelia Mould, MD;  Location: [place];  Service: Vascular;  Laterality: Right;  . PV Angio  1994   L Renal artery stent     Family History  Problem Relation Age of Onset  . Heart failure Mother   . Hypertension Mother   . Cancer Father        lung  . Dementia Sister   . Cancer Sister        pancreatic, renal and thyroid  . Heart attack Brother   . AAA (abdominal aortic aneurysm) Brother   . Stroke Maternal Grandmother   . Diabetes Other     Social History   Socioeconomic History  . Marital status: Married    Spouse name: Not on file  . Number of children: 6  . Years of education: Not on file  . Highest education level: Not on file  Occupational History  . Not on file  Tobacco Use  . Smoking status: Never Smoker  . Smokeless tobacco: Never Used  Substance and Sexual Activity  . Alcohol use: No    Alcohol/week: 0.0 standard drinks  . Drug use: No  . Sexual activity: Not Currently  Other Topics Concern  . Not on file  Social History Narrative   wears sunscreen, brushes and flosses daily, see's dentist bi-annually, has smoke/carbon monoxide detectors, wears a seatbelt and practices gun safety   Social Determinants of Health   Financial Resource Strain:   . Difficulty of Paying Living Expenses: Not on file  Food Insecurity:   . Worried About Charity fundraiser in the Last Year: Not on file  . Ran Out of Food in the Last Year: Not on file  Transportation Needs:   . Lack of Transportation (Medical): Not on file  . Lack of Transportation (Non-Medical): Not on file  Physical Activity:   . Days of Exercise per Week: Not on file  . Minutes of Exercise per Session: Not on file  Stress:   . Feeling of Stress : Not on file  Social Connections:   . Frequency of Communication with Friends and Family: Not on file  . Frequency of Social Gatherings with Friends and  Family: Not on file  . Attends Religious Services: Not on file  . Active Member of Clubs or Organizations: Not on file  . Attends Archivist Meetings: Not on file  . Marital Status: Not on file  Intimate Partner Violence:   . Fear of Current or Ex-Partner: Not on file  . Emotionally Abused: Not on file  . Physically Abused: Not on file  . Sexually Abused: Not on file    Outpatient Medications  Prior to Visit  Medication Sig Dispense Refill  . allopurinol (ZYLOPRIM) 100 MG tablet Take 100 mg by mouth every evening.     Marland Kitchen ALPRAZolam (XANAX) 0.5 MG tablet Take 0.5 mg by mouth at bedtime.     Marland Kitchen amLODipine (NORVASC) 5 MG tablet Take 5 mg by mouth daily.     . calcium carbonate (OSCAL) 1500 (600 Ca) MG TABS tablet Take by mouth.    . digoxin (LANOXIN) 0.125 MG tablet Take 0.0625 mg by mouth every evening.     . ezetimibe (ZETIA) 10 MG tablet Take 1 tablet (10 mg total) by mouth daily. 90 tablet 3  . fenofibrate micronized (LOFIBRA) 134 MG capsule Take 134 mg by mouth daily.     . ferrous sulfate 325 (65 FE) MG tablet Take 325 mg by mouth 3 (three) times daily with meals.    . fluticasone (FLONASE) 50 MCG/ACT nasal spray Place 2 sprays into both nostrils daily as needed for allergies or rhinitis.     . furosemide (LASIX) 40 MG tablet Take 40 mg by mouth 2 (two) times daily.     Marland Kitchen glipiZIDE (GLUCOTROL) 5 MG tablet Take by mouth 2 (two) times daily before a meal.    . Insulin Detemir (LEVEMIR FLEXPEN) 100 UNIT/ML Pen Inject 42 Units into the skin at bedtime.     Marland Kitchen levothyroxine (SYNTHROID, LEVOTHROID) 50 MCG tablet Take 50 mcg by mouth daily before breakfast.     . losartan (COZAAR) 100 MG tablet Take 100 mg by mouth daily.     . magnesium oxide (MAG-OX) 400 MG tablet Take 400 mg by mouth 2 (two) times daily.    . meclizine (ANTIVERT) 25 MG tablet Take 25 mg by mouth 3 (three) times daily as needed for dizziness.    . Multiple Vitamin (MULTIVITAMIN WITH MINERALS) TABS tablet Take 1  tablet by mouth daily. Centrum Silver    . omega-3 acid ethyl esters (LOVAZA) 1 G capsule Take 1 g by mouth 2 (two) times daily.     Marland Kitchen oxyCODONE-acetaminophen (PERCOCET) 7.5-325 MG tablet Take 1 tablet by mouth every 6 (six) hours as needed (back pain.). 120 tablet 0  . pantoprazole (PROTONIX) 40 MG tablet Take 40 mg by mouth 2 (two) times daily.    . polyethylene glycol (MIRALAX / GLYCOLAX) 17 g packet Take 17 g by mouth 2 (two) times daily.    . pravastatin (PRAVACHOL) 20 MG tablet Take 10 mg by mouth 4 (four) times a week.     . Rivaroxaban (XARELTO) 15 MG TABS tablet Take 1 tablet (15 mg total) by mouth daily with supper. 30 tablet 11  . rOPINIRole (REQUIP) 1 MG tablet Take 1 mg by mouth daily.     Marland Kitchen tiZANidine (ZANAFLEX) 4 MG tablet Take 4 mg by mouth at bedtime.     Marland Kitchen ACCU-CHEK SMARTVIEW test strip     . triamcinolone cream (KENALOG) 0.1 % Apply 1 application topically daily. Mix with nystatin cream & apply to affected area of forehead    . Aspirin Buf,CaCarb-MgCarb-MgO, 81 MG TABS Take by mouth.    Marland Kitchen aspirin EC 81 MG EC tablet Take 1 tablet (81 mg total) by mouth daily.    . Coenzyme Q10 200 MG capsule Take 200 mg by mouth daily.    Marland Kitchen nystatin cream (MYCOSTATIN) Apply 1 application topically daily. Mix with triamcinolone  cream & apply to affected area of forehead    . Tetrahydrozoline HCl (VISINE OP) Place 2 drops into  both eyes 4 (four) times daily as needed (for dry eyes).      No facility-administered medications prior to visit.    Allergies  Allergen Reactions  . Gabapentin Swelling  . Pregabalin Swelling       . Morphine Nausea Only  . Penicillins Rash and Other (See Comments)    Has patient had a PCN reaction causing immediate rash, facial/tongue/throat swelling, SOB or lightheadedness with hypotension: No Has patient had a PCN reaction causing severe rash involving mucus membranes or skin necrosis: No Has patient had a PCN reaction that required hospitalization: No - MD  office Has patient had a PCN reaction occurring within the last 10 years: No If all of the above answers are "NO", then may proceed with Cephalosporin use.   . Tape Rash  . Ace Inhibitors Other (See Comments)    Angioedema (ALLERGY/intolerance)  . Carvedilol Other (See Comments)    Myalgias (intolerance)  . Insulin Glargine Itching  . Metoprolol Other (See Comments)    Angioedema (ALLERGY/intolerance)  . Procaine Other (See Comments)    unknown  . Statins Other (See Comments)    Myalgias (intolerance)  . Lisinopril Rash         Review of Systems Review of Systems  Constitutional: Negative for chills, diaphoresis, fever and malaise/fatigue.  HENT: Negative for congestion, ear pain and sore throat.   Respiratory: Negative for cough and sputum production.   Cardiovascular: Negative for chest pain and palpitations.      Objective:    Physical Exam  BP (!) 150/68 (BP Location: Left Arm, Patient Position: Sitting, Cuff Size: Normal)   Pulse 100   Temp 98 F (36.7 C)   Resp 18   Ht '5\' 6"'$  (1.676 m)   Wt 171 lb 6.4 oz (77.7 kg)   BMI 27.66 kg/m  Wt Readings from Last 3 Encounters:  12/01/19 171 lb 6.4 oz (77.7 kg)  11/24/19 168 lb (76.2 kg)  11/18/19 168 lb (76.2 kg)   Health Maintenance Due  Topic Date Due  . FOOT EXAM  09/18/1946  . OPHTHALMOLOGY EXAM  09/18/1946  . HEMOGLOBIN A1C  08/21/2019     Physical Exam Vitals reviewed.  Constitutional:      Appearance: Normal appearance.  Cardiovascular:     Rate and Rhythm: Normal rate and regular rhythm.     Pulses: Normal pulses.     Heart sounds: Normal heart sounds.  Pulmonary:     Effort: Pulmonary effort is normal.     Breath sounds: Normal breath sounds. No wheezing, rhonchi or rales.  Musculoskeletal: Left knee tender to palpation.no effusion or edema. Decreased rom of left knee due to pain.  Neurological:     Mental Status: ALERT Psychiatric:        Mood and Affect: Mood normal.        Behavior:  Behavior normal.    There are no preventive care reminders to display for this patient.   Lab Results  Component Value Date   TSH 1.65 11/30/2014   Lab Results  Component Value Date   WBC 6.2 09/25/2018   HGB 10.7 (L) 09/25/2018   HCT 33.6 (L) 09/25/2018   MCV 88.2 09/25/2018   PLT 233 09/25/2018   Lab Results  Component Value Date   NA 139 09/25/2018   K 3.9 09/25/2018   CO2 23 09/25/2018   GLUCOSE 96 09/25/2018   BUN 37 (H) 09/25/2018   CREATININE 1.82 (H) 09/25/2018   BILITOT 0.7 09/24/2018  ALKPHOS 29 (L) 09/24/2018   AST 19 09/24/2018   ALT 17 09/24/2018   PROT 6.3 (L) 09/24/2018   ALBUMIN 3.8 09/24/2018   CALCIUM 8.8 (L) 09/25/2018   ANIONGAP 11 09/25/2018   GFR 38.32 (L) 11/30/2014   No results found for: CHOL No results found for: HDL No results found for: LDLCALC No results found for: TRIG No results found for: CHOLHDL Lab Results  Component Value Date   HGBA1C 7.0 (H) 09/24/2018  Arthrocentesis Left Knee: Risks and benefits discussed with patient including bleeding, infection, pain, increased sugars. Pt consented. I cleansed with Betadine and alcohol.  I initially inserted the needle into the lateral side of the knee joint however immediately met with Bony calcifications.  As such reentered on the medial side of the left knee without issue.    Assessment & Plan:   Primary osteoarthritis of left knee Arthrocentesis of left knee completed.    Diabetic polyneuropathy associated with diabetes mellitus due to underlying condition [facility]) Patient recommended to restart levemir at 5 U before supper. Patient can anticipate an increase in her sugars over the next few days.   Problem List Items Addressed This Visit      Endocrine   Diabetic polyneuropathy associated with diabetes mellitus due to underlying condition [facility])    Patient recommended to restart levemir at 5 U before supper. Patient can anticipate an increase in her sugars over the next few days.          Musculoskeletal and Integument   Primary osteoarthritis of left knee - Primary    Arthrocentesis of left knee completed.         For the arthrocentesis of her left knee.  The area was    Primary osteoarthritis of left knee Arthrocentesis of left knee completed.    Diabetic polyneuropathy associated with diabetes mellitus due to underlying condition [facility]) Patient recommended to restart levemir at 5 U before supper. Patient can anticipate an increase in her sugars over the next few days.    [name], MD

## 2019-12-01 NOTE — Assessment & Plan Note (Signed)
Arthrocentesis of left knee completed.

## 2019-12-06 ENCOUNTER — Encounter: Payer: Self-pay | Admitting: Family Medicine

## 2019-12-06 MED ORDER — TRIAMCINOLONE ACETONIDE 40 MG/ML IJ SUSP: 40.0000 mg | Freq: Once | INTRAMUSCULAR | Status: DC

## 2019-12-15 ENCOUNTER — Other Ambulatory Visit: Payer: Self-pay

## 2019-12-15 MED ORDER — TIZANIDINE HCL 4 MG PO TABS: 4.0000 mg | ORAL_TABLET | Freq: Every day | ORAL | 0 refills | Status: DC

## 2019-12-17 ENCOUNTER — Other Ambulatory Visit: Payer: Self-pay

## 2019-12-17 MED ORDER — TIZANIDINE HCL 4 MG PO TABS: 4.0000 mg | ORAL_TABLET | Freq: Every day | ORAL | 0 refills | Status: DC

## 2019-12-18 ENCOUNTER — Other Ambulatory Visit: Payer: Self-pay

## 2019-12-18 MED ORDER — OXYCODONE-ACETAMINOPHEN 7.5-325 MG PO TABS: 1.0000 | ORAL_TABLET | Freq: Four times a day (QID) | ORAL | 0 refills | Status: DC | PRN

## 2019-12-24 NOTE — Progress Notes (Signed)
Established Patient Office Visit  Subjective:  Patient ID: [name], female    DOB: 04-Aug-1934  Age: 86 y.o. MRN: [id number]  CC:  Chief Complaint  Patient presents with  . Hyperlipidemia  . Hypertension  . Diabetes    HPI Concerning hypertensive heart disease with heart failure, she is here today for routine follow-up.  It is unclear when the diagnosis of CHF was first made.  The course of the disease has been stable.  Currently, her treatment regimen consists of a diuretic ( furosemide ), digoxin, and losartan 100 mg one daily.  She denies any adverse medication effects.      Additionally, she presents with history of persistent atrial fibrillation.  she is here for routine follow-up for atrial fibrillation. Current related medications include digoxin for rate control and xarelto.      In regard to the type 2 diabetes mellitus with diabetic autonomic (poly)neuropathy, specifically, this is type 2, non-insulin requiring diabetes, complicated by peripheral neuropathy.  Compliance with treatment has been good; she takes her medication as directed, follows up as directed, and is keeping a glucose diary.  Patient's diabetes was first diagnosed > 8 years ago.  Primary symptoms reported include peripheral neuropathy (manifested as numbness ) and polydipsia.  She specifically denies polyphagia or polyuria.  She reports home blood glucose readings have been excellent, with average fasting glucoses running 100-120.  She checks her glucose 2 times daily.  In regard to preventative care, she performs foot self-exams daily and her last ophthalmology exam was in 2020.      [name] presents with a diagnosis of chronic kidney disease, stage 4 (severe).  The course has been stable and nonprogressive. Sees nephrology.  Past Medical History:  Diagnosis Date  . Amiodarone pulmonary toxicity   . Anemia    no GI bleeding; on iron  . Arthritis   . Atrial fibrillation ([place])   .  Biventricular ICD (implantable cardiac defibrillator) in place    BiV ICD for nonischemic cardiomyopathy; BiV responder  . Carotid artery occlusion   . CHF (congestive heart failure) ([place])   . Chronic a-fib ([place])    on xarelto; failed DCCV  . Chronic kidney disease, stage 4 (severe) ([place])   . Diabetes ([place])   . Fibromyalgia   . Full dentures   . GAD (generalized anxiety disorder)   . GERD (gastroesophageal reflux disease)   . H/O cardiovascular stress test 08/04/2010   normal imaging; EF 61%  . H/O echocardiogram 07/05/2010   EF 45-50%; aortic valve-mildly sclerotic; LA-mod dilaterd   . Hyperlipemia   . Hypertension   . Hypertensive heart disease with heart failure ([place])   . Hypothalamic disease ([place])    on thyroid supplement  . Hypothyroidism   . Hypothyroidism   . LBBB (left bundle branch block)   . Mixed hyperlipidemia   . Occlusion and stenosis of right carotid artery   . Other persistent atrial fibrillation ([place])   . Pneumonia   . Presence of automatic (implantable) cardiac defibrillator   . PVD (peripheral vascular disease) ([place])    L RA stent 1994  . RAS (renal artery stenosis) ([place])    a. renal dopp (12/18/08)- Right RA-<60%; L RA stent- open; nl size and shape in both kidneys;  b.  Rena Artery [name] (3/16):  SMA with > 70% stenosis, Bilateral prox RA with 1-59%  . Renal insufficiency, mild    05/01/12 Creat 1.47  . Restless leg syndrome   .  Type 2 diabetes mellitus with diabetic neuropathy ([place])   . VT (ventricular tachycardia) ([place]) 9/11   polymorphic VT probably related to sotalol therapy  . Wears glasses     Past Surgical History:  Procedure Laterality Date  . ABDOMINAL HYSTERECTOMY    . APPENDECTOMY    . AV NODE ABLATION  06/08/2006   performed by Dr. Beckie Salts; due to Afib with RVR and tachy brady syndrome  . BIV ICD GENERATOR CHANGEOUT N/A 12/31/2017   Procedure: BIV ICD GENERATOR CHANGEOUT;  Surgeon: Evans Lance, MD;  Location: [facility];   Service: Cardiovascular;  Laterality: N/A;  . BIV ICD GENERTAOR CHANGE OUT  11/15/10   Medtronic Protecta [id number] serial #[id number]  . BiV ICD Placement  06/17/2007; 04/20/14   Medtronic Concerta [id number]; RA lead-Med 5076/53cm, [id number]; RV lead- SJM 7121/65cm, [id number]; LV lead- Med 4194/88cm, [id number]; gen change 04/2014 by Dr Lovena Le  . BIV PACEMAKER GENERATOR CHANGE OUT N/A 04/20/2014   Procedure: BIV PACEMAKER GENERATOR CHANGE OUT;  Surgeon: Evans Lance, MD;  Location[facility] C[facility];  Service: Cardiovascular;  Laterality: N/A;  . BRAIN MENINGIOMA EXCISION  2011   L frontal meningioma at [facility]   . BREAST SURGERY     lumpectomy right  . CARDIOVERSION  07/21/05   converted to sinus brady  . CAROTID ENDARTERECTOMY Right 09/24/2018   with bovine pericardial patch angioplasty  . CATARACT EXTRACTION W/ INTRAOCULAR LENS  IMPLANT, BILATERAL    . CHOLECYSTECTOMY     Dr. Sherald Hess in [place] and Dr. Lyda Jester follows; surgical stricture post procedure with stent placement  . COLONOSCOPY W/ BIOPSIES AND POLYPECTOMY    . DILATION AND CURETTAGE OF UTERUS    . ENDARTERECTOMY Right 09/24/2018   Procedure: ENDARTERECTOMY CAROTID RIGHT;  Surgeon: Angelia Mould, MD;  Location: [facility];  Service: Vascular;  Laterality: Right;  . PV Angio  1994   L Renal artery stent     Family History  Problem Relation Age of Onset  . Heart failure Mother   . Hypertension Mother   . Cancer Father        lung  . Dementia Sister   . Cancer Sister        pancreatic, renal and thyroid  . Heart attack Brother   . AAA (abdominal aortic aneurysm) Brother   . Stroke Maternal Grandmother   . Diabetes Other     Social History   Socioeconomic History  . Marital status: Married    Spouse name: Not on file  . Number of children: 6  . Years of education: Not on file  . Highest education level: Not on file  Occupational History  . Not on file  Tobacco Use  . Smoking status: Never Smoker  .  Smokeless tobacco: Never Used  Substance and Sexual Activity  . Alcohol use: No    Alcohol/week: 0.0 standard drinks  . Drug use: No  . Sexual activity: Not Currently  Other Topics Concern  . Not on file  Social History Narrative   wears sunscreen, brushes and flosses daily, see's dentist bi-annually, has smoke/carbon monoxide detectors, wears a seatbelt and practices gun safety   Social Determinants of Health   Financial Resource Strain:   . Difficulty of Paying Living Expenses:   Food Insecurity:   . Worried About Charity fundraiser in the Last Year:   . Arboriculturist in the Last Year:   Transportation Needs:   . Film/video editor (Medical):   Marland Kitchen  Lack of Transportation (Non-Medical):   Physical Activity:   . Days of Exercise per Week:   . Minutes of Exercise per Session:   Stress:   . Feeling of Stress :   Social Connections:   . Frequency of Communication with Friends and Family:   . Frequency of Social Gatherings with Friends and Family:   . Attends Religious Services:   . Active Member of Clubs or Organizations:   . Attends Archivist Meetings:   Marland Kitchen Marital Status:   Intimate Partner Violence:   . Fear of Current or Ex-Partner:   . Emotionally Abused:   Marland Kitchen Physically Abused:   . Sexually Abused:     Outpatient Medications Prior to Visit  Medication Sig Dispense Refill  . ACCU-CHEK SMARTVIEW test strip     . allopurinol (ZYLOPRIM) 100 MG tablet Take 100 mg by mouth every evening.     Marland Kitchen ALPRAZolam (XANAX) 0.5 MG tablet Take 0.5 mg by mouth at bedtime.     Marland Kitchen amLODipine (NORVASC) 5 MG tablet Take 5 mg by mouth daily.     . calcium carbonate (OSCAL) 1500 (600 Ca) MG TABS tablet Take by mouth.    . digoxin (LANOXIN) 0.125 MG tablet Take 0.0625 mg by mouth every evening.     . ezetimibe (ZETIA) 10 MG tablet Take 1 tablet (10 mg total) by mouth daily. 90 tablet 3  . fenofibrate micronized (LOFIBRA) 134 MG capsule Take 134 mg by mouth daily.     . ferrous  sulfate 325 (65 FE) MG tablet Take 325 mg by mouth 2 (two) times daily with a meal.     . fluticasone (FLONASE) 50 MCG/ACT nasal spray Place 2 sprays into both nostrils daily as needed for allergies or rhinitis.     . furosemide (LASIX) 40 MG tablet Take 40 mg by mouth 2 (two) times daily.     Marland Kitchen glipiZIDE (GLUCOTROL) 5 MG tablet Take by mouth 2 (two) times daily before a meal.    . Insulin Detemir (LEVEMIR FLEXPEN) 100 UNIT/ML Pen Inject 5 Units into the skin at bedtime.     Marland Kitchen levothyroxine (SYNTHROID, LEVOTHROID) 50 MCG tablet Take 50 mcg by mouth daily before breakfast.     . losartan (COZAAR) 100 MG tablet Take 100 mg by mouth daily.     . magnesium oxide (MAG-OX) 400 MG tablet Take 400 mg by mouth 2 (two) times daily.    . meclizine (ANTIVERT) 25 MG tablet Take 25 mg by mouth 3 (three) times daily as needed for dizziness.    . Multiple Vitamin (MULTIVITAMIN WITH MINERALS) TABS tablet Take 1 tablet by mouth daily. Centrum Silver    . omega-3 acid ethyl esters (LOVAZA) 1 G capsule Take 1 g by mouth 2 (two) times daily.     Marland Kitchen oxyCODONE-acetaminophen (PERCOCET) 7.5-325 MG tablet Take 1 tablet by mouth every 6 (six) hours as needed (back pain.). 120 tablet 0  . polyethylene glycol (MIRALAX / GLYCOLAX) 17 g packet Take 17 g by mouth 2 (two) times daily.    . pravastatin (PRAVACHOL) 20 MG tablet Take 10 mg by mouth 4 (four) times a week.     . Rivaroxaban (XARELTO) 15 MG TABS tablet Take 1 tablet (15 mg total) by mouth daily with supper. 30 tablet 11  . rOPINIRole (REQUIP) 1 MG tablet Take 1 mg by mouth daily.     Marland Kitchen tiZANidine (ZANAFLEX) 4 MG tablet Take 1 tablet (4 mg total) by mouth at bedtime.  90 tablet 0  . triamcinolone cream (KENALOG) 0.1 % Apply 1 application topically daily. Mix with nystatin cream & apply to affected area of forehead    . pantoprazole (PROTONIX) 40 MG tablet Take 40 mg by mouth 2 (two) times daily.     Facility-Administered Medications Prior to Visit  Medication Dose Route  Frequency Provider Last Rate Last Admin  . triamcinolone acetonide (KENALOG-40) injection 40 mg  40 mg Intra-articular Once [name], MD        Allergies  Allergen Reactions  . Gabapentin Swelling  . Pregabalin Swelling       . Morphine Nausea Only  . Penicillins Rash and Other (See Comments)    Has patient had a PCN reaction causing immediate rash, facial/tongue/throat swelling, SOB or lightheadedness with hypotension: No Has patient had a PCN reaction causing severe rash involving mucus membranes or skin necrosis: No Has patient had a PCN reaction that required hospitalization: No - MD office Has patient had a PCN reaction occurring within the last 10 years: No If all of the above answers are "NO", then may proceed with Cephalosporin use.   . Tape Rash  . Ace Inhibitors Other (See Comments)    Angioedema (ALLERGY/intolerance)  . Carvedilol Other (See Comments)    Myalgias (intolerance)  . Insulin Glargine Itching  . Metoprolol Other (See Comments)    Angioedema (ALLERGY/intolerance)  . Pantoprazole Nausea Only  . Procaine Other (See Comments)    unknown  . Statins Other (See Comments)    Myalgias (intolerance)  . Lisinopril Rash         ROS Review of Systems  Constitutional: Positive for fatigue. Negative for chills and fever.  HENT: Negative for congestion, ear pain, rhinorrhea and sore throat.   Respiratory: Negative for cough and shortness of breath.   Cardiovascular: Negative for chest pain.  Gastrointestinal: Negative for abdominal pain, constipation, diarrhea, nausea and vomiting.  Genitourinary: Negative for dysuria and urgency.  Musculoskeletal: Negative for back pain and myalgias.  Neurological: Negative for dizziness, weakness, light-headedness and headaches.  Psychiatric/Behavioral: Negative for dysphoric mood. The patient is not nervous/anxious.       Objective:    Physical Exam  Constitutional: She appears well-developed and well-nourished.    Cardiovascular: Normal rate, regular rhythm and normal heart sounds.  Pulmonary/Chest: Effort normal and breath sounds normal.  Abdominal: Soft. Bowel sounds are normal. There is no abdominal tenderness.  Musculoskeletal:     Comments: Tenderness on lateral left knee.  Neurological: She is alert.  Psychiatric: She has a normal mood and affect. Her behavior is normal.    BP (!) 148/70   Pulse 84   Temp (!) 96.7 F (35.9 C)   Resp (!) 84   Ht 5\' 6"  (1.676 m)   Wt 168 lb 12.8 oz (76.6 kg)   BMI 27.25 kg/m  Wt Readings from Last 3 Encounters:  12/25/19 168 lb 12.8 oz (76.6 kg)  12/01/19 171 lb 6.4 oz (77.7 kg)  11/24/19 168 lb (76.2 kg)     Health Maintenance Due  Topic Date Due  . FOOT EXAM  Never done  . OPHTHALMOLOGY EXAM  Never done  . HEMOGLOBIN A1C  08/21/2019    There are no preventive care reminders to display for this patient.  Lab Results  Component Value Date   TSH 1.65 11/30/2014   Lab Results  Component Value Date   WBC 6.2 09/25/2018   HGB 10.7 (L) 09/25/2018   HCT 33.6 (L) 09/25/2018  MCV 88.2 09/25/2018   PLT 233 09/25/2018   Lab Results  Component Value Date   NA 139 09/25/2018   K 3.9 09/25/2018   CO2 23 09/25/2018   GLUCOSE 96 09/25/2018   BUN 37 (H) 09/25/2018   CREATININE 1.82 (H) 09/25/2018   BILITOT 0.7 09/24/2018   ALKPHOS 29 (L) 09/24/2018   AST 19 09/24/2018   ALT 17 09/24/2018   PROT 6.3 (L) 09/24/2018   ALBUMIN 3.8 09/24/2018   CALCIUM 8.8 (L) 09/25/2018   ANIONGAP 11 09/25/2018   GFR 38.32 (L) 11/30/2014   No results found for: CHOL No results found for: HDL No results found for: LDLCALC No results found for: TRIG No results found for: CHOLHDL Lab Results  Component Value Date   HGBA1C 7.0 (H) 09/24/2018      Assessment & Plan:  ATRIAL FIBRILLATION Stable. The current medical regimen is effective;  continue present plan and medications.  Chronic systolic heart failure (HCC) Class 2 CHF. The current medical  regimen is effective;  continue present plan and medications.  Secondary hypothyroidism No changes to medicines.  Labs drawn today.   Primary osteoarthritis of left knee Order knee xray.  Recommend voltaren gel apply 4 gm four times a day prn  Mixed hyperlipidemia Well controlled.  No changes to medicines.  Continue to work on eating a healthy diet and exercise.  Labs drawn today.   Dyslipidemia associated with type 2 diabetes mellitus ([place]) Well controlled. Check sugars daily. No changes to medicines.  Continue to work on eating a healthy diet and exercise.  Labs drawn today.   Diabetic polyneuropathy associated with diabetes mellitus due to underlying condition [employer]) The current medical regimen is effective;  continue present plan and medications. Continue diet and exercise.  Continue to check sugars.       Follow-up: Return in about 3 months (around 03/26/2020).    [name], MD

## 2019-12-25 ENCOUNTER — Other Ambulatory Visit: Payer: Self-pay

## 2019-12-25 ENCOUNTER — Encounter: Payer: Self-pay | Admitting: Family Medicine

## 2019-12-25 ENCOUNTER — Ambulatory Visit (INDEPENDENT_AMBULATORY_CARE_PROVIDER_SITE_OTHER): Payer: Medicare HMO | Admitting: Family Medicine

## 2019-12-25 VITALS — BP 148/70 | HR 84 | Temp 96.7°F | Resp 84 | Ht 66.0 in | Wt 168.8 lb

## 2019-12-25 DIAGNOSIS — I4811 Longstanding persistent atrial fibrillation: Secondary | ICD-10-CM

## 2019-12-25 DIAGNOSIS — E785 Hyperlipidemia, unspecified: Secondary | ICD-10-CM | POA: Diagnosis not present

## 2019-12-25 DIAGNOSIS — E782 Mixed hyperlipidemia: Secondary | ICD-10-CM

## 2019-12-25 DIAGNOSIS — E1169 Type 2 diabetes mellitus with other specified complication: Secondary | ICD-10-CM | POA: Insufficient documentation

## 2019-12-25 DIAGNOSIS — M170 Bilateral primary osteoarthritis of knee: Secondary | ICD-10-CM | POA: Diagnosis not present

## 2019-12-25 DIAGNOSIS — Z95810 Presence of automatic (implantable) cardiac defibrillator: Secondary | ICD-10-CM | POA: Diagnosis not present

## 2019-12-25 DIAGNOSIS — E0842 Diabetes mellitus due to underlying condition with diabetic polyneuropathy: Secondary | ICD-10-CM | POA: Diagnosis not present

## 2019-12-25 DIAGNOSIS — M1712 Unilateral primary osteoarthritis, left knee: Secondary | ICD-10-CM

## 2019-12-25 DIAGNOSIS — N184 Chronic kidney disease, stage 4 (severe): Secondary | ICD-10-CM | POA: Diagnosis not present

## 2019-12-25 DIAGNOSIS — M1711 Unilateral primary osteoarthritis, right knee: Secondary | ICD-10-CM | POA: Diagnosis not present

## 2019-12-25 DIAGNOSIS — I5022 Chronic systolic (congestive) heart failure: Secondary | ICD-10-CM

## 2019-12-25 DIAGNOSIS — E038 Other specified hypothyroidism: Secondary | ICD-10-CM

## 2019-12-25 DIAGNOSIS — E1122 Type 2 diabetes mellitus with diabetic chronic kidney disease: Secondary | ICD-10-CM | POA: Diagnosis not present

## 2019-12-25 HISTORY — DX: Type 2 diabetes mellitus with other specified complication: E11.69

## 2019-12-25 HISTORY — DX: Other specified hypothyroidism: E03.8

## 2019-12-25 LAB — POCT UA - MICROALBUMIN
Creatinine, POC: 0 mg/dL
Microalbumin Ur, POC: 10 mg/L

## 2019-12-25 NOTE — Assessment & Plan Note (Signed)
No changes to medicines.   Labs drawn today.   

## 2019-12-25 NOTE — Assessment & Plan Note (Signed)
Stable. The current medical regimen is effective;  continue present plan and medications.  

## 2019-12-25 NOTE — Assessment & Plan Note (Signed)
Well controlled.  ?No changes to medicines.  ?Continue to work on eating a healthy diet and exercise.  ?Labs drawn today.  ?

## 2019-12-25 NOTE — Assessment & Plan Note (Signed)
Order knee xray.  Recommend voltaren gel apply 4 gm four times a day prn

## 2019-12-25 NOTE — Patient Instructions (Signed)
Medical problems stable except left knee pain. The current medical regimen is effective;  continue present plan and medications. Recommend trial on voltaren gel otc apply 4 gm four times a day as needed for knee pain. Get knee xray

## 2019-12-25 NOTE — Assessment & Plan Note (Signed)
The current medical regimen is effective;  continue present plan and medications. Continue diet and exercise.  Continue to check sugars.

## 2019-12-25 NOTE — Assessment & Plan Note (Signed)
Class 2 CHF. The current medical regimen is effective;  continue present plan and medications.

## 2019-12-25 NOTE — Assessment & Plan Note (Signed)
Well controlled. Check sugars daily. No changes to medicines.  Continue to work on eating a healthy diet and exercise.  Labs drawn today.

## 2019-12-26 LAB — HEMOGLOBIN A1C
Est. average glucose Bld gHb Est-mCnc: 128 mg/dL
Hgb A1c MFr Bld: 6.1 % — ABNORMAL HIGH (ref 4.8–5.6)

## 2019-12-26 LAB — TSH: TSH: 2.14 u[IU]/mL (ref 0.450–4.500)

## 2019-12-26 LAB — COMPREHENSIVE METABOLIC PANEL
ALT: 18 IU/L (ref 0–32)
AST: 19 IU/L (ref 0–40)
Albumin/Globulin Ratio: 2 (ref 1.2–2.2)
Albumin: 4.3 g/dL (ref 3.6–4.6)
Alkaline Phosphatase: 31 IU/L — ABNORMAL LOW (ref 39–117)
BUN/Creatinine Ratio: 26 (ref 12–28)
BUN: 39 mg/dL — ABNORMAL HIGH (ref 8–27)
Bilirubin Total: 0.4 mg/dL (ref 0.0–1.2)
CO2: 25 mmol/L (ref 20–29)
Calcium: 10.1 mg/dL (ref 8.7–10.3)
Chloride: 101 mmol/L (ref 96–106)
Creatinine, Ser: 1.48 mg/dL — ABNORMAL HIGH (ref 0.57–1.00)
GFR calc Af Amer: 37 mL/min/{1.73_m2} — ABNORMAL LOW (ref >59)
GFR calc non Af Amer: 33 mL/min/{1.73_m2} — ABNORMAL LOW (ref >59)
Globulin, Total: 2.1 g/dL (ref 1.5–4.5)
Glucose: 98 mg/dL (ref 65–99)
Potassium: 4.2 mmol/L (ref 3.5–5.2)
Sodium: 141 mmol/L (ref 134–144)
Total Protein: 6.4 g/dL (ref 6.0–8.5)

## 2019-12-26 LAB — CBC WITH DIFFERENTIAL/PLATELET
Basophils Absolute: 0.1 10*3/uL (ref 0.0–0.2)
Basos: 1 %
EOS (ABSOLUTE): 0.1 10*3/uL (ref 0.0–0.4)
Eos: 2 %
Hematocrit: 36.5 % (ref 34.0–46.6)
Hemoglobin: 11.6 g/dL (ref 11.1–15.9)
Immature Grans (Abs): 0 10*3/uL (ref 0.0–0.1)
Immature Granulocytes: 1 %
Lymphocytes Absolute: 1.2 10*3/uL (ref 0.7–3.1)
Lymphs: 16 %
MCH: 28.9 pg (ref 26.6–33.0)
MCHC: 31.8 g/dL (ref 31.5–35.7)
MCV: 91 fL (ref 79–97)
Monocytes Absolute: 0.4 10*3/uL (ref 0.1–0.9)
Monocytes: 6 %
Neutrophils Absolute: 5.4 10*3/uL (ref 1.4–7.0)
Neutrophils: 74 %
Platelets: 273 10*3/uL (ref 150–450)
RBC: 4.01 x10E6/uL (ref 3.77–5.28)
RDW: 14.2 % (ref 11.7–15.4)
WBC: 7.2 10*3/uL (ref 3.4–10.8)

## 2019-12-26 LAB — LIPID PANEL
Chol/HDL Ratio: 2.4 ratio (ref 0.0–4.4)
Cholesterol, Total: 138 mg/dL (ref 100–199)
HDL: 58 mg/dL (ref >39)
LDL Chol Calc (NIH): 68 mg/dL (ref 0–99)
Triglycerides: 55 mg/dL (ref 0–149)
VLDL Cholesterol Cal: 12 mg/dL (ref 5–40)

## 2019-12-26 LAB — CARDIOVASCULAR RISK ASSESSMENT

## 2019-12-30 DIAGNOSIS — D649 Anemia, unspecified: Secondary | ICD-10-CM | POA: Diagnosis not present

## 2019-12-30 DIAGNOSIS — D509 Iron deficiency anemia, unspecified: Secondary | ICD-10-CM | POA: Diagnosis not present

## 2020-01-05 DIAGNOSIS — D509 Iron deficiency anemia, unspecified: Secondary | ICD-10-CM | POA: Diagnosis not present

## 2020-01-06 ENCOUNTER — Ambulatory Visit (INDEPENDENT_AMBULATORY_CARE_PROVIDER_SITE_OTHER): Payer: Medicare HMO | Admitting: *Deleted

## 2020-01-06 DIAGNOSIS — I442 Atrioventricular block, complete: Secondary | ICD-10-CM | POA: Diagnosis not present

## 2020-01-06 LAB — CUP PACEART REMOTE DEVICE CHECK
Battery Remaining Longevity: 103 mo
Battery Voltage: 2.99 V
Brady Statistic AP VP Percent: 0.09 %
Brady Statistic AP VS Percent: 99.35 %
Brady Statistic AS VP Percent: 0 %
Brady Statistic AS VS Percent: 0.55 %
Brady Statistic RA Percent Paced: 99.42 %
Brady Statistic RV Percent Paced: 0.09 %
Date Time Interrogation Session: 20210330004405
Implantable Lead Implant Date: 20080908
Implantable Lead Implant Date: 20080908
Implantable Lead Implant Date: 20190326
Implantable Lead Location: 753858
Implantable Lead Location: 753860
Implantable Lead Location: 753860
Implantable Lead Model: 3830
Implantable Lead Model: 4194
Implantable Lead Model: 7121
Implantable Pulse Generator Implant Date: 20190326
Lead Channel Impedance Value: 266 Ohm
Lead Channel Impedance Value: 285 Ohm
Lead Channel Impedance Value: 342 Ohm
Lead Channel Impedance Value: 399 Ohm
Lead Channel Impedance Value: 418 Ohm
Lead Channel Impedance Value: 418 Ohm
Lead Channel Impedance Value: 475 Ohm
Lead Channel Impedance Value: 551 Ohm
Lead Channel Impedance Value: 551 Ohm
Lead Channel Sensing Intrinsic Amplitude: 6.625 mV
Lead Channel Sensing Intrinsic Amplitude: 6.625 mV
Lead Channel Sensing Intrinsic Amplitude: 9.75 mV
Lead Channel Sensing Intrinsic Amplitude: 9.75 mV
Lead Channel Setting Pacing Amplitude: 2 V
Lead Channel Setting Pacing Amplitude: 2.5 V
Lead Channel Setting Pacing Pulse Width: 0.4 ms
Lead Channel Setting Sensing Sensitivity: 1.2 mV

## 2020-01-06 NOTE — Progress Notes (Signed)
PPM Remote  

## 2020-01-07 ENCOUNTER — Other Ambulatory Visit: Payer: Self-pay

## 2020-01-07 MED ORDER — FENOFIBRATE MICRONIZED 134 MG PO CAPS: 134.0000 mg | ORAL_CAPSULE | Freq: Every day | ORAL | 0 refills | Status: DC

## 2020-01-07 MED ORDER — OMEGA-3-ACID ETHYL ESTERS 1 G PO CAPS: 1.0000 g | ORAL_CAPSULE | Freq: Two times a day (BID) | ORAL | 0 refills | Status: DC

## 2020-01-07 MED ORDER — EZETIMIBE 10 MG PO TABS: 10.0000 mg | ORAL_TABLET | Freq: Every day | ORAL | 1 refills | Status: DC

## 2020-01-12 ENCOUNTER — Other Ambulatory Visit: Payer: Self-pay | Admitting: Family Medicine

## 2020-01-12 DIAGNOSIS — D509 Iron deficiency anemia, unspecified: Secondary | ICD-10-CM | POA: Diagnosis not present

## 2020-01-12 DIAGNOSIS — I482 Chronic atrial fibrillation, unspecified: Secondary | ICD-10-CM

## 2020-01-12 MED ORDER — RIVAROXABAN 15 MG PO TABS: 15.0000 mg | ORAL_TABLET | Freq: Every day | ORAL | 3 refills | Status: DC

## 2020-01-15 ENCOUNTER — Other Ambulatory Visit: Payer: Self-pay

## 2020-01-15 MED ORDER — OMEGA-3-ACID ETHYL ESTERS 1 G PO CAPS: 1.0000 g | ORAL_CAPSULE | Freq: Two times a day (BID) | ORAL | 1 refills | Status: DC

## 2020-01-15 MED ORDER — ALPRAZOLAM 0.5 MG PO TABS: 0.5000 mg | ORAL_TABLET | Freq: Every day | ORAL | 2 refills | Status: DC

## 2020-01-15 MED ORDER — FENOFIBRATE MICRONIZED 134 MG PO CAPS: 134.0000 mg | ORAL_CAPSULE | Freq: Every day | ORAL | 0 refills | Status: DC

## 2020-01-15 MED ORDER — EZETIMIBE 10 MG PO TABS: 10.0000 mg | ORAL_TABLET | Freq: Every day | ORAL | 1 refills | Status: DC

## 2020-01-16 ENCOUNTER — Other Ambulatory Visit: Payer: Self-pay

## 2020-01-16 MED ORDER — OXYCODONE-ACETAMINOPHEN 7.5-325 MG PO TABS: 1.0000 | ORAL_TABLET | Freq: Four times a day (QID) | ORAL | 0 refills | Status: DC | PRN

## 2020-01-19 ENCOUNTER — Other Ambulatory Visit: Payer: Self-pay | Admitting: Family Medicine

## 2020-01-19 MED ORDER — OXYCODONE-ACETAMINOPHEN 7.5-325 MG PO TABS: 1.0000 | ORAL_TABLET | Freq: Four times a day (QID) | ORAL | 0 refills | Status: DC | PRN

## 2020-01-21 ENCOUNTER — Telehealth: Payer: Self-pay | Admitting: Family Medicine

## 2020-01-21 NOTE — Progress Notes (Signed)
  Chronic Care Management   Note  01/21/2020 Name: [name] MRN: [id number] DOB: [birth year]  Caleb Popp [name] is a 86 y.o. year old female who is a primary care patient of Cox, Kirsten, MD. I reached out to [name] by phone today in response to a referral sent by [name] PCP, [name], MD.   [name] was given information about Chronic Care Management services today including:  1. CCM service includes personalized support from designated clinical staff supervised by her physician, including individualized plan of care and coordination with other care providers 2. 24/7 contact phone numbers for assistance for urgent and routine care needs. 3. Service will only be billed when office clinical staff spend 20 minutes or more in a month to coordinate care. 4. Only one practitioner may furnish and bill the service in a calendar month. 5. The patient may stop CCM services at any time (effective at the end of the month) by phone call to the office staff.   Patient agreed to services and verbal consent obtained.   Follow up plan:   Earney Hamburg Upstream Scheduler

## 2020-01-28 ENCOUNTER — Other Ambulatory Visit: Payer: Self-pay | Admitting: Family Medicine

## 2020-02-09 ENCOUNTER — Other Ambulatory Visit: Payer: Self-pay | Admitting: Family Medicine

## 2020-02-12 NOTE — Chronic Care Management (AMB) (Signed)
Chronic Care Management Pharmacy  Name: Danniel Grenz  MRN: [id number] DOB: 03-12-1934  Chief Complaint/ HPI  [name],  86 y.o. , female presents for their Initial CCM visit with the clinical pharmacist via telephone due to COVID-19 Pandemic.  PCP : [name], MD  Their chronic conditions include: AV Block, Atrial fibrillation, Chronic systolic heart failure, NSVT, Carotid artery stenosis, Diabetic polyneuropathy, Dyslipidemia, Hypothyroidism, OA of the left knee  Office Visits: 12/25/2019 - Recommend voltaren gel apply 4 gm four times a day prn 12/01/2019 - arthrocentesis of left knee completed.  11/18/2019 - Medicare AWV with nursing staff.  Consult Visit: 11/24/2019 - cardiologist visit - no med changes.    Medications: Outpatient Encounter Medications as of 02/16/2020  Medication Sig  . ACCU-CHEK SMARTVIEW test strip   . ALPRAZolam (XANAX) 0.5 MG tablet TAKE ONE TABLET BY MOUTH EVERY NIGHT AT BEDTIME FOR INSOMNIA  . amLODipine (NORVASC) 5 MG tablet Take 5 mg by mouth daily.   Marland Kitchen Apple Cider Vinegar 188 MG CAPS Take 2 capsules by mouth daily.  Marland Kitchen aspirin 81 MG chewable tablet Chew 81 mg by mouth daily.  . calcium carbonate (OSCAL) 1500 (600 Ca) MG TABS tablet Take by mouth.  . Coenzyme Q10 200 MG capsule Take 200 mg by mouth daily.  . digoxin (LANOXIN) 0.125 MG tablet Take 0.0625 mg by mouth every evening.   . ezetimibe (ZETIA) 10 MG tablet Take 1 tablet (10 mg total) by mouth daily. (Patient taking differently: Take 5 mg by mouth daily. )  . fenofibrate micronized (LOFIBRA) 134 MG capsule Take 1 capsule (134 mg total) by mouth daily.  . fluticasone (FLONASE) 50 MCG/ACT nasal spray Place 2 sprays into both nostrils daily as needed for allergies or rhinitis.   . furosemide (LASIX) 40 MG tablet Take 40 mg by mouth 2 (two) times daily.   Marland Kitchen levothyroxine (SYNTHROID, LEVOTHROID) 50 MCG tablet Take 50 mcg by mouth daily before breakfast.   .  losartan (COZAAR) 100 MG tablet Take 100 mg by mouth daily.   . magnesium oxide (MAG-OX) 400 MG tablet Take 400 mg by mouth 2 (two) times daily.  . meclizine (ANTIVERT) 25 MG tablet Take 25 mg by mouth 3 (three) times daily as needed for dizziness.  . Multiple Vitamin (MULTIVITAMIN WITH MINERALS) TABS tablet Take 1 tablet by mouth daily. Centrum Silver  . olopatadine (PATADAY) 0.1 % ophthalmic solution Place 1 drop into both eyes daily.  Marland Kitchen omega-3 acid ethyl esters (LOVAZA) 1 g capsule Take 1 capsule (1 g total) by mouth 2 (two) times daily.  Marland Kitchen OVER THE COUNTER MEDICATION Take 2 tablets by mouth daily. Takes Beet Root tablets - 2 tablets daily  . oxyCODONE-acetaminophen (PERCOCET) 7.5-325 MG tablet Take 1 tablet by mouth every 6 (six) hours as needed (back pain.).  Marland Kitchen polyethylene glycol (MIRALAX / GLYCOLAX) 17 g packet Take 17 g by mouth daily as needed.   . pravastatin (PRAVACHOL) 20 MG tablet Take 20 mg by mouth 4 (four) times a week.   . Rivaroxaban (XARELTO) 15 MG TABS tablet Take 1 tablet (15 mg total) by mouth daily with supper.  Marland Kitchen tiZANidine (ZANAFLEX) 4 MG tablet TAKE 1 TABLET AT BEDTIME  . triamcinolone cream (KENALOG) 0.1 % Apply 1 application topically daily. Mix with nystatin cream & apply to affected area of forehead  . allopurinol (ZYLOPRIM) 100 MG tablet TAKE 1 TABLET EVERY DAY (Patient not taking: Reported on 02/16/2020)  . ferrous sulfate 325 (  65 FE) MG tablet Take 325 mg by mouth 2 (two) times daily with a meal.   . glipiZIDE (GLUCOTROL) 5 MG tablet Take by mouth 2 (two) times daily before a meal.  . Insulin Detemir (LEVEMIR FLEXPEN) 100 UNIT/ML Pen Inject 5 Units into the skin at bedtime.   Marland Kitchen rOPINIRole (REQUIP) 1 MG tablet Take 1 mg by mouth daily.    Facility-Administered Encounter Medications as of 02/16/2020  Medication  . triamcinolone acetonide (KENALOG-40) injection 40 mg   Allergies  Allergen Reactions  . Gabapentin Swelling  . Pregabalin Swelling       .  Morphine Nausea Only  . Penicillins Rash and Other (See Comments)    Has patient had a PCN reaction causing immediate rash, facial/tongue/throat swelling, SOB or lightheadedness with hypotension: No Has patient had a PCN reaction causing severe rash involving mucus membranes or skin necrosis: No Has patient had a PCN reaction that required hospitalization: No - MD office Has patient had a PCN reaction occurring within the last 10 years: No If all of the above answers are "NO", then may proceed with Cephalosporin use.   . Tape Rash  . Ace Inhibitors Other (See Comments)    Angioedema (ALLERGY/intolerance)  . Carvedilol Other (See Comments)    Myalgias (intolerance)  . Insulin Glargine Itching  . Metoprolol Other (See Comments)    Angioedema (ALLERGY/intolerance)  . Pantoprazole Nausea Only  . Procaine Other (See Comments)    unknown  . Statins Other (See Comments)    Myalgias (intolerance)  . Lisinopril Rash          SDOH Screenings   Alcohol Screen:   . Last Alcohol Screening Score (AUDIT):   Depression (PHQ2-9): Low Risk   . PHQ-2 Score: 0  Financial Resource Strain:   . Difficulty of Paying Living Expenses:   Food Insecurity: No Food Insecurity  . Worried About Charity fundraiser in the Last Year: Never true  . Ran Out of Food in the Last Year: Never true  Housing: Low Risk   . Last Housing Risk Score: 0  Physical Activity:   . Days of Exercise per Week:   . Minutes of Exercise per Session:   Social Connections:   . Frequency of Communication with Friends and Family:   . Frequency of Social Gatherings with Friends and Family:   . Attends Religious Services:   . Active Member of Clubs or Organizations:   . Attends Archivist Meetings:   Marland Kitchen Marital Status:   Stress:   . Feeling of Stress :   Tobacco Use: Low Risk   . Smoking Tobacco Use: Never Smoker  . Smokeless Tobacco Use: Never Used  Transportation Needs: No Transportation Needs  . Lack of  Transportation (Medical): No  . Lack of Transportation (Non-Medical): No      Current Diagnosis/Assessment:  Goals Addressed            This Visit's Progress   . Pharmacy Care Plan       CARE PLAN ENTRY  Current Barriers:  . Chronic Disease Management support, education, and care coordination needs related to Hypertension and Diabetes   Hypertension . Pharmacist Clinical Goal(s): o Over the next 90 days, patient will work with PharmD and providers to achieve BP goal <140/90 . Current regimen:  o Amlodipine 5 mg daily, Losartan 100 mg daily . Interventions: o Recommend patient begin checking blood pressure at home each week and recording it daily between now and next  visit with Dr. Tobie Poet.  . Patient self care activities - Over the next 90 days, patient will: o Check BP weekly, document, and provide at future appointments o Ensure daily salt intake < 2300 mg/day  Hyperlipidemia . Pharmacist Clinical Goal(s): o Over the next 90 days, patient will work with PharmD and providers to maintain LDL goal < 70 mg/dL o Ensure safety, efficacy, and affordability of medications . Current regimen:  o Pravastatin 20 mg - 4 times weekly o Coenzyme q10 200 mg daily o Omega 3 1000 mg daily . Interventions: o Recommend patient continue with healthy diet each day until next visit with Dr. Tobie Poet.  . Patient self care activities - Over the next 90 days, patient will: o Call provider with any concerns or issues, continue taking medication as prescribed each day and request prescription refills from [facility] as needed.   Diabetes . Pharmacist Clinical Goal(s): o Over the next 90 days, patient will work with PharmD and providers to maintain A1c goal <7% . Current regimen:  o Diet and exercise managed currently.  . Interventions: o Recommend patient continue to avoid using Levemir since low blood sugar. Patient to continue monitoring blood sugar each day and contact pharmacist if  fasting blood sugar gets <70 mg/dL or >130 mg/dL.  Marland Kitchen Patient self care activities - Over the next 90 days, patient will: o Check blood sugar once daily, document, and provide at future appointments o Contact provider with any episodes of hypoglycemia  Medication management . Pharmacist Clinical Goal(s): o Over the next 90 days, patient will work with PharmD and providers to maintain optimal medication adherence . Current pharmacy: [facility] . Interventions o Comprehensive medication review performed. o Continue current medication management strategy . Patient self care activities - Over the next 90 days, patient will: o Focus on medication adherence by continuing to use pill box. o Take medications as prescribed o Report any questions or concerns to PharmD and/or provider(s)  Initial goal documentation        AFIB   Patient is currently rhythm controlled.   Patient has failed these meds in past: carvedilol, metoprolol Patient is currently controlled on the following medications: Xarelto 15 mg, Digoxin   We discussed:  diet and exercise extensively. Patient tries to cook and eat healthy. Patient has a pacemaker and afib. She is not a candidate for back surgery due to heart helath. Patient reports that pacemaker is controlling symptoms.   Plan  Continue current medications   Hyperlipidemia   Lipid Panel     Component Value Date/Time   CHOL 138 12/25/2019 1058   TRIG 55 12/25/2019 1058   HDL 58 12/25/2019 1058   CHOLHDL 2.4 12/25/2019 1058   LDLCALC 68 12/25/2019 1058   LABVLDL 12 12/25/2019 1058     The ASCVD Risk score (Goff DC Jr., et al., 2013) failed to calculate for the following reasons:   The 2013 ASCVD risk score is only valid for ages 34 to 66   Patient has failed these meds in past: pitavastatin 2 mg  Patient is currently controlled on the following medications: zetia 5 mg daily, fenofibrate 134 mg daily, pravastatin 20 mg 4 times per week,  Coenzyme q10 200 mg daily, omega 3 1000 mg daily   We discussed:  diet and exercise extensively. Patient is caring for her husband and son so does not have time for exercise.  Plan  Continue current medications   ,  Diabetes   Recent  Relevant Labs: Lab Results  Component Value Date/Time   HGBA1C 6.1 (H) 12/25/2019 10:58 AM   HGBA1C 7.0 (H) 09/24/2018 06:43 AM   MICROALBUR 10 12/25/2019 12:45 PM     Checking BG: 2x per Day  Recent FBG Readings: 80-120 mg/dL Recent HS BG readings: 140-150 mg/dL Patient has failed these meds in past: Levemir flex pen 5 units bedtime, glipizide 5 mg bid, metformin 1000 mg bid Patient is currently controlled on the following medications: accu-check smartview test strips  Last diabetic Foot exam: 12/2019 Last diabetic Eye exam: 05/2019  We discussed: diet and exercise extensively. Has peppermint discs and orange juice. Has lost some weight. Started taking apple cider vinegar capsules and thinks it could be helping her with weight loss. Tries to cook healthy and eats meats and vegetables. Has been busy taking care of son and husband.   Since patient's weight loss her blood sugar is much lower. She was previously on 42 units of Levemir. Dr. Tobie Poet reduced to 5 units at bedtime. Her blood sugar was 59 mg/dL one night with the 5 units. She stopped taking Levemir in the past few weeks. Her blood sugars since then were 80-120 mg/dL.  Patient is not working to lose weight but attributes it to apple cider vinegar she began taking recently. 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   Plan  Continue current medications and control with diet and exercise,  Heart Failure   Type: Systolic  Last ejection fraction: 60-65% NYHA Class: II (slight limitation of activity)  Patient has failed these meds in past: carvediolol, metoprolol, lisinopril Patient is currently controlled on the following medications: furosemide 40 mg bid,  losartan 100 mg daily We discussed diet and exercise extensively. She tries to watch sodium when cooking. She indicates slight foot swelling. She will elevate feet in recliner if she ever gets a change to sit during the day.   Plan  Continue current medications ,  Hypertension   BP today is:  <140/90  Office blood pressures are  BP Readings from Last 3 Encounters:  12/25/19 (!) 148/70  12/01/19 (!) 150/68  11/24/19 (!) 136/59    Patient has failed these meds in the past: lisinopril  Patient is currently uncontrolled on the following medications: amlodipine 5 mg daily, losartan 100 mg daily Patient checks BP at home infrequently  Patient home BP readings are ranging: 145/70  We discussed diet and exercise extensively Patient was on lisinopril previously but had an allergic reaction. Do not take any more ACEI. Not salting food. She eats some potato chips. Doesn't like real salty foods.   Plan  Continue current medications   Hypothyroidism   TSH  Date Value Ref Range Status  12/25/2019 2.140 0.450 - 4.500 uIU/mL Final     Patient has failed these meds in past: n/a Patient is currently controlled on the following medications: levothyroxine 50 mcg daily  We discussed:  diet and exercise extensively  Plan  Continue current medications    and  Other Diagnosis:Gout   Patient has failed these meds in past: allopurinol 100 mg daily Patient is currently controlled on the following medications: nothing currently.  We discussed:  diet and exercise extensively Patient stopped taking allopurinol in the past few days. She was having trouble breathing similar to allergic reaction with lisinopril. She is staying hoarse most of the time. She read that it could be coming from allopurinol. She is hoping to avoid resuming this medication if  possible. She doesn't remember any triggers to gout in the past.    Plan  Continue current medications and control with diet and  exercise  Pain   Patient has failed these meds in past: n/a Patient is currently controlled on the following medications: Oxycodone 7.5/325 mg q6h prn, tizanidine 4 mg qhs  We discussed:  Patient's back pain is managed by oxycodone/apap and tizanidine. 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 She is not a candidate for surgery due to heart.  Plan  Continue current medications   Health Maintenance   Patient is currently controlled on the following medications:  OsCal 1500 mg with Vitamin D - bone health Alprazolam 0.5 mg hs - sleep Magnesium 400 mg bid -leg cramps helps with bowels Apple Cider Vinegar capsule - general health Beet Root Tablet - general health Fluticasone 50 mcg/actu - allergies Multivitamin with Minerals - general health Miralax prn - constipation Triamcinolone cream mixed with Nystatin- skin irritation on forehead   We discussed: Patient has stopped ropinirole 1 mg daily due to potential for causing potential side effects. She indicates that her symptoms have been ok with this discontinuation.   Plan  Continue current medications  Vaccines   Reviewed and discussed patient's vaccination history. COVID vaccine - has had both shots.   Immunization History  Administered Date(s) Administered  . Influenza-Unspecified 08/09/2018, 06/23/2019  . Pneumococcal Conjugate-13 10/05/2014  . Pneumococcal Polysaccharide-23 06/11/2013  . Tdap 05/28/2017    Plan  Recommended patient receive annual flu vaccine in office.  Medication Management   Pt uses [facility] for all medications Uses pill box? Yes Pt endorses good compliance  We discussed: Fills up medicine boxes every Wednesday. Occasionally can tell if she misses a dose of medication because she feels funny and will start taking right now.   Plan  Continue current medication management strategy    Follow up: 2 month phone visit

## 2020-02-16 ENCOUNTER — Ambulatory Visit: Payer: Medicare HMO

## 2020-02-16 ENCOUNTER — Other Ambulatory Visit: Payer: Self-pay

## 2020-02-16 DIAGNOSIS — E782 Mixed hyperlipidemia: Secondary | ICD-10-CM

## 2020-02-16 DIAGNOSIS — E0842 Diabetes mellitus due to underlying condition with diabetic polyneuropathy: Secondary | ICD-10-CM

## 2020-02-16 NOTE — Patient Instructions (Addendum)
Visit Information  Thank you for your time discussing your medications. I look forward to working with you to achieve your health care goals. Below is a summary of what we talked about during our visit.   Goals Addressed            This Visit's Progress   . Pharmacy Care Plan       CARE PLAN ENTRY  Current Barriers:  . Chronic Disease Management support, education, and care coordination needs related to Hypertension and Diabetes   Hypertension . Pharmacist Clinical Goal(s): o Over the next 90 days, patient will work with PharmD and providers to achieve BP goal <140/90 . Current regimen:  o Amlodipine 5 mg daily, Losartan 100 mg daily . Interventions: o Recommend patient begin checking blood pressure at home each week and recording it daily between now and next visit with Dr. Tobie Poet.  . Patient self care activities - Over the next 90 days, patient will: o Check BP weekly, document, and provide at future appointments o Ensure daily salt intake < 2300 mg/day  Hyperlipidemia . Pharmacist Clinical Goal(s): o Over the next 90 days, patient will work with PharmD and providers to maintain LDL goal < 70 mg/dL o Ensure safety, efficacy, and affordability of medications . Current regimen:  o Pravastatin 20 mg - 4 times weekly o Coenzyme q10 200 mg daily o Omega 3 1000 mg daily . Interventions: o Recommend patient continue with healthy diet each day until next visit with Dr. Tobie Poet.  . Patient self care activities - Over the next 90 days, patient will: o Call provider with any concerns or issues, continue taking medication as prescribed each day and request prescription refills from [facility] as needed.   Diabetes . Pharmacist Clinical Goal(s): o Over the next 90 days, patient will work with PharmD and providers to maintain A1c goal <7% . Current regimen:  o Diet and exercise managed currently.  . Interventions: o Recommend patient continue to avoid using Levemir since  low blood sugar. Patient to continue monitoring blood sugar each day and contact pharmacist if fasting blood sugar gets <70 mg/dL or >130 mg/dL.  Marland Kitchen Patient self care activities - Over the next 90 days, patient will: o Check blood sugar once daily, document, and provide at future appointments o Contact provider with any episodes of hypoglycemia  Medication management . Pharmacist Clinical Goal(s): o Over the next 90 days, patient will work with PharmD and providers to maintain optimal medication adherence . Current pharmacy: [facility] . Interventions o Comprehensive medication review performed. o Continue current medication management strategy . Patient self care activities - Over the next 90 days, patient will: o Focus on medication adherence by continuing to use pill box. o Take medications as prescribed o Report any questions or concerns to PharmD and/or provider(s)  Initial goal documentation        Ms. Emme was given information about Chronic Care Management services today including:  1. CCM service includes personalized support from designated clinical staff supervised by her physician, including individualized plan of care and coordination with other care providers 2. 24/7 contact phone numbers for assistance for urgent and routine care needs. 3. Standard insurance, coinsurance, copays and deductibles apply for chronic care management only during months in which we provide at least 20 minutes of these services. Most insurances cover these services at 100%, however patients may be responsible for any copay, coinsurance and/or deductible if applicable. This service may help you avoid  the need for more expensive face-to-face services. 4. Only one practitioner may furnish and bill the service in a calendar month. 5. The patient may stop CCM services at any time (effective at the end of the month) by phone call to the office staff.  Patient agreed to services and verbal  consent obtained.   The patient verbalized understanding of instructions provided today and agreed to receive a mailed copy of patient instruction and/or educational materials. Telephone follow up appointment with pharmacy team member scheduled for:04/06/2020  Sherre Poot, PharmD Clinical Pharmacist [facility] [phone]   How to Take Your Blood Pressure You can take your blood pressure at home with a machine. You may need to check your blood pressure at home:  To check if you have high blood pressure (hypertension).  To check your blood pressure over time.  To make sure your blood pressure medicine is working. Supplies needed: You will need a blood pressure machine, or monitor. You can buy one at a drugstore or online. When choosing one:  Choose one with an arm cuff.  Choose one that wraps around your upper arm. Only one finger should fit between your arm and the cuff.  Do not choose one that measures your blood pressure from your wrist or finger. Your doctor can suggest a monitor. How to prepare Avoid these things for 30 minutes before checking your blood pressure:  Drinking caffeine.  Drinking alcohol.  Eating.  Smoking.  Exercising. Five minutes before checking your blood pressure:  Pee.  Sit in a dining chair. Avoid sitting in a soft couch or armchair.  Be quiet. Do not talk. How to take your blood pressure Follow the instructions that came with your machine. If you have a digital blood pressure monitor, these may be the instructions: 1. Sit up straight. 2. Place your feet on the floor. Do not cross your ankles or legs. 3. Rest your left arm at the level of your heart. You may rest it on a table, desk, or chair. 4. Pull up your shirt sleeve. 5. Wrap the blood pressure cuff around the upper part of your left arm. The cuff should be 1 inch (2.5 cm) above your elbow. It is best to wrap the cuff around bare skin. 6. Fit the cuff snugly around your  arm. You should be able to place only one finger between the cuff and your arm. 7. Put the cord inside the groove of your elbow. 8. Press the power button. 9. Sit quietly while the cuff fills with air and loses air. 10. Write down the numbers on the screen. 11. Wait 2-3 minutes and then repeat steps 1-10. What do the numbers mean? Two numbers make up your blood pressure. The first number is called systolic pressure. The second is called diastolic pressure. An example of a blood pressure reading is "120 over 80" (or 120/80). If you are an adult and do not have a medical condition, use this guide to find out if your blood pressure is normal: Normal  First number: below 120.  Second number: below 80. Elevated  First number: 120-129.  Second number: below 80. Hypertension stage 1  First number: 130-139.  Second number: 80-89. Hypertension stage 2  First number: 140 or above.  Second number: 38 or above. Your blood pressure is above normal even if only the top or bottom number is above normal. Follow these instructions at home:  Check your blood pressure as often as your doctor tells you  to.  Take your monitor to your next doctor's appointment. Your doctor will: ? Make sure you are using it correctly. ? Make sure it is working right.  Make sure you understand what your blood pressure numbers should be.  Tell your doctor if your medicines are causing side effects. Contact a doctor if:  Your blood pressure keeps being high. Get help right away if:  Your first blood pressure number is higher than 180.  Your second blood pressure number is higher than 120. This information is not intended to replace advice given to you by your health care provider. Make sure you discuss any questions you have with your health care provider. Document Revised: 09/07/2017 Document Reviewed: 03/03/2016 Elsevier Patient Education  2020 [employer].

## 2020-02-17 ENCOUNTER — Other Ambulatory Visit: Payer: Self-pay

## 2020-02-17 MED ORDER — TIZANIDINE HCL 4 MG PO TABS: 4.0000 mg | ORAL_TABLET | Freq: Every day | ORAL | 0 refills | Status: DC

## 2020-02-18 ENCOUNTER — Other Ambulatory Visit: Payer: Self-pay

## 2020-02-19 ENCOUNTER — Other Ambulatory Visit: Payer: Self-pay

## 2020-02-19 MED ORDER — OXYCODONE-ACETAMINOPHEN 7.5-325 MG PO TABS: 1.0000 | ORAL_TABLET | Freq: Four times a day (QID) | ORAL | 0 refills | Status: DC | PRN

## 2020-02-26 ENCOUNTER — Other Ambulatory Visit: Payer: Self-pay

## 2020-02-26 DIAGNOSIS — E038 Other specified hypothyroidism: Secondary | ICD-10-CM

## 2020-02-26 DIAGNOSIS — E0842 Diabetes mellitus due to underlying condition with diabetic polyneuropathy: Secondary | ICD-10-CM

## 2020-02-26 DIAGNOSIS — E782 Mixed hyperlipidemia: Secondary | ICD-10-CM

## 2020-02-26 NOTE — Progress Notes (Signed)
Patient had an appt with Sherre Poot, PharmD on

## 2020-03-03 ENCOUNTER — Other Ambulatory Visit: Payer: Self-pay | Admitting: Family Medicine

## 2020-03-10 ENCOUNTER — Other Ambulatory Visit: Payer: Self-pay | Admitting: Family Medicine

## 2020-03-22 ENCOUNTER — Other Ambulatory Visit: Payer: Self-pay

## 2020-03-22 MED ORDER — OXYCODONE-ACETAMINOPHEN 7.5-325 MG PO TABS: 1.0000 | ORAL_TABLET | Freq: Four times a day (QID) | ORAL | 0 refills | Status: DC | PRN

## 2020-03-24 ENCOUNTER — Other Ambulatory Visit: Payer: Self-pay | Admitting: Family Medicine

## 2020-03-31 DIAGNOSIS — L570 Actinic keratosis: Secondary | ICD-10-CM | POA: Diagnosis not present

## 2020-04-01 ENCOUNTER — Ambulatory Visit: Payer: Medicare HMO | Admitting: Family Medicine

## 2020-04-06 ENCOUNTER — Telehealth: Payer: Medicare HMO

## 2020-04-06 ENCOUNTER — Ambulatory Visit (INDEPENDENT_AMBULATORY_CARE_PROVIDER_SITE_OTHER): Payer: Medicare HMO | Admitting: *Deleted

## 2020-04-06 DIAGNOSIS — I482 Chronic atrial fibrillation, unspecified: Secondary | ICD-10-CM | POA: Diagnosis not present

## 2020-04-06 DIAGNOSIS — D509 Iron deficiency anemia, unspecified: Secondary | ICD-10-CM | POA: Diagnosis not present

## 2020-04-06 DIAGNOSIS — D649 Anemia, unspecified: Secondary | ICD-10-CM | POA: Diagnosis not present

## 2020-04-06 LAB — CUP PACEART REMOTE DEVICE CHECK
Battery Remaining Longevity: 97 mo
Battery Voltage: 2.99 V
Brady Statistic AP VP Percent: 0.09 %
Brady Statistic AP VS Percent: 99.44 %
Brady Statistic AS VP Percent: 0 %
Brady Statistic AS VS Percent: 0.46 %
Brady Statistic RA Percent Paced: 99.51 %
Brady Statistic RV Percent Paced: 0.1 %
Date Time Interrogation Session: 20210629005251
Implantable Lead Implant Date: 20080908
Implantable Lead Implant Date: 20080908
Implantable Lead Implant Date: 20190326
Implantable Lead Location: 753858
Implantable Lead Location: 753860
Implantable Lead Location: 753860
Implantable Lead Model: 3830
Implantable Lead Model: 4194
Implantable Lead Model: 7121
Implantable Pulse Generator Implant Date: 20190326
Lead Channel Impedance Value: 266 Ohm
Lead Channel Impedance Value: 266 Ohm
Lead Channel Impedance Value: 323 Ohm
Lead Channel Impedance Value: 399 Ohm
Lead Channel Impedance Value: 399 Ohm
Lead Channel Impedance Value: 437 Ohm
Lead Channel Impedance Value: 437 Ohm
Lead Channel Impedance Value: 570 Ohm
Lead Channel Impedance Value: 570 Ohm
Lead Channel Sensing Intrinsic Amplitude: 6.625 mV
Lead Channel Sensing Intrinsic Amplitude: 6.625 mV
Lead Channel Sensing Intrinsic Amplitude: 6.625 mV
Lead Channel Sensing Intrinsic Amplitude: 6.625 mV
Lead Channel Setting Pacing Amplitude: 2 V
Lead Channel Setting Pacing Amplitude: 2.5 V
Lead Channel Setting Pacing Pulse Width: 0.4 ms
Lead Channel Setting Sensing Sensitivity: 1.2 mV

## 2020-04-08 NOTE — Progress Notes (Signed)
Remote pacemaker transmission.   

## 2020-04-15 ENCOUNTER — Other Ambulatory Visit: Payer: Self-pay | Admitting: Family Medicine

## 2020-04-19 ENCOUNTER — Other Ambulatory Visit: Payer: Self-pay | Admitting: Family Medicine

## 2020-04-19 ENCOUNTER — Other Ambulatory Visit: Payer: Self-pay

## 2020-04-19 MED ORDER — OXYCODONE-ACETAMINOPHEN 7.5-325 MG PO TABS: 1.0000 | ORAL_TABLET | Freq: Four times a day (QID) | ORAL | 0 refills | Status: DC | PRN

## 2020-04-19 MED ORDER — ALPRAZOLAM 0.5 MG PO TABS: ORAL_TABLET | ORAL | 2 refills | Status: DC

## 2020-04-21 NOTE — Chronic Care Management (AMB) (Signed)
Chronic Care Management Pharmacy  Name: [name]  MRN: [id number] DOB: [birth year]  Chief Complaint/ HPI  [name],  86 y.o. , female presents for their Initial CCM visit with the clinical pharmacist via telephone due to COVID-19 Pandemic.  PCP : Sandy Brome, MD  Their chronic conditions include: AV Block, Atrial fibrillation, Chronic systolic heart failure, NSVT, Carotid artery stenosis, Diabetic polyneuropathy, Dyslipidemia, Hypothyroidism, OA of the left knee  Office Visits: 12/25/2019 - Recommend voltaren gel apply 4 gm four times a day prn 12/01/2019 - arthrocentesis of left knee completed.  11/18/2019 - Medicare AWV with nursing staff.  Consult Visit: 04/06/2020 - remote pacemaker transmission.  11/24/2019 - cardiologist visit - no med changes.    Medications: Outpatient Encounter Medications as of 04/22/2020  Medication Sig  . ACCU-CHEK SMARTVIEW test strip   . allopurinol (ZYLOPRIM) 100 MG tablet TAKE 1 TABLET EVERY DAY (Patient not taking: Reported on 02/16/2020)  . ALPRAZolam (XANAX) 0.5 MG tablet TAKE ONE TABLET BY MOUTH EVERY NIGHT AT BEDTIME FOR INSOMNIA  . amLODipine (NORVASC) 5 MG tablet Take 5 mg by mouth daily.   Marland Kitchen Apple Cider Vinegar 188 MG CAPS Take 2 capsules by mouth daily.  Marland Kitchen aspirin 81 MG chewable tablet Chew 81 mg by mouth daily.  . calcium carbonate (OSCAL) 1500 (600 Ca) MG TABS tablet Take by mouth.  . Coenzyme Q10 200 MG capsule Take 200 mg by mouth daily.  . digoxin (LANOXIN) 0.125 MG tablet TAKE 1/2 TABLET EVERY DAY (SUBSTITUTED FOR  DIGITEK)  . DROPLET PEN NEEDLES 31G X 5 MM MISC USE AS DIRECTED WITH LEVEMIR  . ezetimibe (ZETIA) 10 MG tablet Take 1 tablet (10 mg total) by mouth daily. (Patient taking differently: Take 5 mg by mouth daily. )  . fenofibrate micronized (LOFIBRA) 134 MG capsule Take 1 capsule (134 mg total) by mouth daily.  . ferrous sulfate 325 (65 FE) MG tablet Take 325 mg by mouth 2 (two) times  daily with a meal.   . fluticasone (FLONASE) 50 MCG/ACT nasal spray Place 2 sprays into both nostrils daily as needed for allergies or rhinitis.   . furosemide (LASIX) 40 MG tablet TAKE 1 TABLET TWICE DAILY  . glipiZIDE (GLUCOTROL) 5 MG tablet Take by mouth 2 (two) times daily before a meal.  . LEVEMIR FLEXTOUCH 100 UNIT/ML FlexPen INJECT  42 UNITS ONE TIME DAILY  . levothyroxine (SYNTHROID, LEVOTHROID) 50 MCG tablet Take 50 mcg by mouth daily before breakfast.   . losartan (COZAAR) 100 MG tablet Take 100 mg by mouth daily.   . magnesium oxide (MAG-OX) 400 MG tablet Take 400 mg by mouth 2 (two) times daily.  . meclizine (ANTIVERT) 25 MG tablet Take 25 mg by mouth 3 (three) times daily as needed for dizziness.  . Multiple Vitamin (MULTIVITAMIN WITH MINERALS) TABS tablet Take 1 tablet by mouth daily. Centrum Silver  . olopatadine (PATADAY) 0.1 % ophthalmic solution Place 1 drop into both eyes daily.  Marland Kitchen omega-3 acid ethyl esters (LOVAZA) 1 g capsule Take 1 capsule (1 g total) by mouth 2 (two) times daily.  Marland Kitchen OVER THE COUNTER MEDICATION Take 2 tablets by mouth daily. Takes Beet Root tablets - 2 tablets daily  . oxyCODONE-acetaminophen (PERCOCET) 7.5-325 MG tablet Take 1 tablet by mouth every 6 (six) hours as needed (back pain.).  Marland Kitchen polyethylene glycol (MIRALAX / GLYCOLAX) 17 g packet Take 17 g by mouth daily as needed.   . pravastatin (PRAVACHOL) 10 MG tablet  TAKE 1 TABLET FOUR TIMES WEEKLY  . pravastatin (PRAVACHOL) 20 MG tablet Take 20 mg by mouth 4 (four) times a week.   . Rivaroxaban (XARELTO) 15 MG TABS tablet Take 1 tablet (15 mg total) by mouth daily with supper.  Marland Kitchen rOPINIRole (REQUIP) 1 MG tablet Take 1 mg by mouth daily.   Marland Kitchen tiZANidine (ZANAFLEX) 4 MG tablet TAKE 1 TABLET (4 MG TOTAL) BY MOUTH AT BEDTIME.  Marland Kitchen triamcinolone cream (KENALOG) 0.1 % Apply 1 application topically daily. Mix with nystatin cream & apply to affected area of forehead   Facility-Administered Encounter Medications as  of 04/22/2020  Medication  . triamcinolone acetonide (KENALOG-40) injection 40 mg   Allergies  Allergen Reactions  . Gabapentin Swelling  . Pregabalin Swelling       . Morphine Nausea Only  . Penicillins Rash and Other (See Comments)    Has patient had a PCN reaction causing immediate rash, facial/tongue/throat swelling, SOB or lightheadedness with hypotension: No Has patient had a PCN reaction causing severe rash involving mucus membranes or skin necrosis: No Has patient had a PCN reaction that required hospitalization: No - MD office Has patient had a PCN reaction occurring within the last 10 years: No If all of the above answers are "NO", then may proceed with Cephalosporin use.   . Tape Rash  . Ace Inhibitors Other (See Comments)    Angioedema (ALLERGY/intolerance)  . Carvedilol Other (See Comments)    Myalgias (intolerance)  . Insulin Glargine Itching  . Metoprolol Other (See Comments)    Angioedema (ALLERGY/intolerance)  . Pantoprazole Nausea Only  . Procaine Other (See Comments)    unknown  . Statins Other (See Comments)    Myalgias (intolerance)  . Lisinopril Rash          SDOH Screenings   Alcohol Screen:   . Last Alcohol Screening Score (AUDIT):   Depression (PHQ2-9): Low Risk   . PHQ-2 Score: 0  Financial Resource Strain:   . Difficulty of Paying Living Expenses:   Food Insecurity: No Food Insecurity  . Worried About Charity fundraiser in the Last Year: Never true  . Ran Out of Food in the Last Year: Never true  Housing: Low Risk   . Last Housing Risk Score: 0  Physical Activity:   . Days of Exercise per Week:   . Minutes of Exercise per Session:   Social Connections:   . Frequency of Communication with Friends and Family:   . Frequency of Social Gatherings with Friends and Family:   . Attends Religious Services:   . Active Member of Clubs or Organizations:   . Attends Archivist Meetings:   Marland Kitchen Marital Status:   Stress:   . Feeling of  Stress :   Tobacco Use: Low Risk   . Smoking Tobacco Use: Never Smoker  . Smokeless Tobacco Use: Never Used  Transportation Needs: No Transportation Needs  . Lack of Transportation (Medical): No  . Lack of Transportation (Non-Medical): No    Current Diagnosis/Assessment:  Goals Addressed            This Visit's Progress   . Pharmacy Care Plan       CARE PLAN ENTRY  Current Barriers:  . Chronic Disease Management support, education, and care coordination needs related to Hypertension and Diabetes   Hypertension . Pharmacist Clinical Goal(s): o Over the next 90 days, patient will work with PharmD and providers to maintain BP goal <140/90 . Current regimen:  o  Amlodipine 5 mg daily, Losartan 100 mg daily . Interventions: o Recommend patient begin checking blood pressure at home each week and recording it daily between now and next visit with Dr. Tobie Poet.  o Patient has ordered a new meter to ensure home monitoring accuracy.  o Discussed caffeine affecting heart rate and blood pressure.  o Discussed appropriate technique for home bp monitoring.  o Discussed patient upcoming follow-up with Cardiologist.  o Recommended patient begin regular exercise for overall health and knee mobility.  . Patient self care activities - Over the next 90 days, patient will: o Check BP weekly, document, and provide at future appointments o Ensure daily salt intake < 2300 mg/day  Hyperlipidemia . Pharmacist Clinical Goal(s): o Over the next 90 days, patient will work with PharmD and providers to maintain LDL goal < 70 mg/dL o Ensure safety, efficacy, and affordability of medications . Current regimen:  o Pravastatin 20 mg - 4 times weekly o Coenzyme q10 200 mg daily o Omega 3 1000 mg daily . Interventions: o Recommend patient continue with healthy diet each day until next visit with Dr. Tobie Poet.  o Recommended incorporate regular exercise. Starting with 10 minutes 1 day each week and increasing as  tolerated to an ultimate goal 30-45 minutes of 4-5 days weekly.  . Patient self care activities - Over the next 90 days, patient will: o Call provider with any concerns or issues, continue taking medication as prescribed each day and request prescription refills from [facility] as needed.   Diabetes . Pharmacist Clinical Goal(s): o Over the next 90 days, patient will work with PharmD and providers to maintain A1c goal <7% . Current regimen:  o Levemir 5 units daily . Interventions: o Recommend patient continue to avoid using Levemir since low blood sugar. Patient to continue monitoring blood sugar each day and contact pharmacist if fasting blood sugar gets <70 mg/dL or >130 mg/dL.  o Discussed blood sugar readings with patient. Patient resumed Levemir on her own.  . Patient self care activities - Over the next 90 days, patient will: o Check blood sugar once daily, document, and provide at future appointments o Contact provider with any episodes of hypoglycemia  Medication management . Pharmacist Clinical Goal(s): o Over the next 90 days, patient will work with PharmD and providers to maintain optimal medication adherence . Current pharmacy: [facility] . Interventions o Comprehensive medication review performed. o Continue current medication management strategy . Patient self care activities - Over the next 90 days, patient will: o Focus on medication adherence by continuing to use pill box. o Take medications as prescribed o Report any questions or concerns to PharmD and/or provider(s)  Please see past updates related to this goal by clicking on the "Past Updates" button in the selected goal         AFIB   Patient is currently rhythm controlled.   Patient has failed these meds in past: carvedilol, metoprolol Patient is currently controlled on the following medications: Xarelto 15 mg, Digoxin   We discussed:  diet and exercise extensively. Patient tries  to cook and eat healthy. Patient has a pacemaker and afib. She is not a candidate for back surgery due to heart helath. Patient reports that pacemaker is controlling symptoms.   Update 04/22/2020  - Patient's bp is in the 70s and 80s at home. At time she feels fatigued and short of breath. She is following up with Dr. Lovena Le in September.   Plan  Continue current medications   Hyperlipidemia   Lipid Panel     Component Value Date/Time   CHOL 138 12/25/2019 1058   TRIG 55 12/25/2019 1058   HDL 58 12/25/2019 1058   CHOLHDL 2.4 12/25/2019 1058   LDLCALC 68 12/25/2019 1058   LABVLDL 12 12/25/2019 1058     The ASCVD Risk score (Goff DC Jr., et al., 2013) failed to calculate for the following reasons:   The 2013 ASCVD risk score is only valid for ages 43 to 76   Patient has failed these meds in past: pitavastatin 2 mg  Patient is currently controlled on the following medications: zetia 5 mg daily, fenofibrate 134 mg daily, pravastatin 20 mg 4 times per week, Coenzyme q10 200 mg daily, omega 3 1000 mg daily   We discussed:  diet and exercise extensively. Patient is caring for her husband and son so does not have time for exercise.  Plan  Continue current medications   ,  Diabetes   Recent Relevant Labs: Lab Results  Component Value Date/Time   HGBA1C 6.1 (H) 12/25/2019 10:58 AM   HGBA1C 7.0 (H) 09/24/2018 06:43 AM   MICROALBUR 10 12/25/2019 12:45 PM     Checking BG: 2x per Day  Recent FBG Readings: 80-120 mg/dL Recent HS BG readings: 140-150 mg/dL Patient has failed these meds in past:  bedtime, glipizide 5 mg bid, metformin 1000 mg bid Patient is currently controlled on the following medications: accu-check smartview test strips, Levemir flex pen 5 units  Last diabetic Foot exam: 12/2019 Last diabetic Eye exam: 05/2019  We discussed: diet and exercise extensively. Has peppermint discs and orange juice. Has lost some weight. Started taking apple cider vinegar capsules  and thinks it could be helping her with weight loss. Tries to cook healthy and eats meats and vegetables. Has been busy taking care of son and husband.   Since patient's weight loss her blood sugar is much lower. She was previously on 42 units of Levemir. Dr. Tobie Poet reduced to 5 units at bedtime. Her blood sugar was 59 mg/dL one night with the 5 units. She stopped taking Levemir in the past few weeks. Her blood sugars since then were 80-120 mg/dL.  Patient is not working to lose weight but attributes it to apple cider vinegar she began taking recently. 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   Update 04/22/2020 - Patient denies any low blood sugars.  103, 123, 117, 105, 104, 115, 105, 122, 111, 158 (after eating), 120, 95, 96, 120. She has resumed Levemir 5 units at bedtime.   Plan  Continue current medications and control with diet and exercise,  Heart Failure   Type: Systolic  Last ejection fraction: 60-65% NYHA Class: II (slight limitation of activity)  Patient has failed these meds in past: carvediolol, metoprolol, lisinopril Patient is currently controlled on the following medications: furosemide 40 mg bid, losartan 100 mg daily We discussed diet and exercise extensively. She tries to watch sodium when cooking. She indicates slight foot swelling. She will elevate feet in recliner if she ever gets a change to sit during the day.   Update 04/22/2020 - Patient reports feeling winded and fatiguing easier. She references her fatigue similar to history of afib. Weighed 169 at the orthopedic doctor this am. Weight is stable denies significant leg swelling. Patient is scheduled for a cardiologist follow-up in September.   Plan  Continue current medications ,  Hypertension   BP today  is:  <140/90  Office blood pressures are  BP Readings from Last 3 Encounters:  12/25/19 (!) 148/70  12/01/19 (!) 150/68  11/24/19 (!) 136/59   Kidney Function Lab  Results  Component Value Date/Time   CREATININE 1.48 (H) 12/25/2019 10:58 AM   CREATININE 1.82 (H) 09/25/2018 05:31 AM   GFR 38.32 (L) 11/30/2014 12:13 PM   GFRNONAA 33 (L) 12/25/2019 10:58 AM   GFRAA 37 (L) 12/25/2019 10:58 AM   K 4.2 12/25/2019 10:58 AM   K 3.9 09/25/2018 05:31 AM    Patient has failed these meds in the past: lisinopril  Patient is currently uncontrolled on the following medications: amlodipine 5 mg daily, losartan 100 mg daily Patient checks BP at home infrequently  Patient home BP readings are ranging: 145/70  We discussed diet and exercise extensively Patient was on lisinopril previously but had an allergic reaction. Do not take any more ACEI. Not salting food. She eats some potato chips. Doesn't like real salty foods.   Update 04/22/2020 - Recent bp readings 118/60, 118/60, 126/57, 124/71, 143/62, 128/61, 135/58, 133/98, 138/62. Pulse: 70, 86, 86. Patient feels like she is getting out of breath some. She has been more fatigued lately. She notes some leg swelling on her bad knee after being up on it all day otherwise no swelling and weight is stable. We discussed the benefits of exercise. Discussed proper monitoring technique of sitting for a few minutes before taking blood pressure, avoiding full bladder and both feet flat on the floor.   Plan  Continue current medications   Hypothyroidism   TSH  Date Value Ref Range Status  12/25/2019 2.140 0.450 - 4.500 uIU/mL Final     Patient has failed these meds in past: n/a Patient is currently controlled on the following medications: levothyroxine 50 mcg daily  We discussed:  diet and exercise extensively  Plan  Continue current medications   Pain   Patient has failed these meds in past: n/a Patient is currently controlled on the following medications: Oxycodone 7.5/325 mg q6h prn, tizanidine 4 mg qhs  We discussed:  Patient's back pain is managed by oxycodone/apap and tizanidine. 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 She is not a candidate for surgery due to heart.  Update 04/22/2020 - Had a visit with orthopedic today. They did a shot for her knee and gave her a heel pad for her shoes to help. She has been encouraged to exercise at home to help her knee. She has a machine at home but it doesn't have a permanent spot in her home and hurts her back to pull it out.   Plan  Continue current medications   Health Maintenance   Patient is currently controlled on the following medications:  OsCal 1500 mg with Vitamin D - bone health Alprazolam 0.5 mg hs - sleep Magnesium 400 mg bid -leg cramps helps with bowels Apple Cider Vinegar capsule - general health Beet Root Tablet - general health Fluticasone 50 mcg/actu - allergies Multivitamin with Minerals - general health Miralax prn - constipation Triamcinolone cream mixed with Nystatin- skin irritation on forehead   We discussed: Patient has stopped ropinirole 1 mg daily due to potential for causing potential side effects. She indicates that her symptoms have been ok with this discontinuation.   Update 04/22/2020 - Patient is wanting something to help her with energy. She is concerned she doesn't have as much lately. She has taken Vitamin b-12  shots in the past and would like to consider resuming. She plans to ask Dr. Tobie Poet at her visit in 2 weeks.   Plan  Continue current medications  Vaccines   Reviewed and discussed patient's vaccination history. COVID vaccine - has had both shots.   Immunization History  Administered Date(s) Administered  . Influenza-Unspecified 08/09/2018, 06/23/2019  . Pneumococcal Conjugate-13 10/05/2014  . Pneumococcal Polysaccharide-23 06/11/2013  . Tdap 05/28/2017    Plan  Recommended patient receive annual flu vaccine in office.  Medication Management   Pt uses [facility] for all medications Uses pill box? Yes Pt endorses good  compliance  We discussed: Fills up medicine boxes every Wednesday.   Plan  Continue current medication management strategy    Follow up: 3 month phone visit

## 2020-04-22 ENCOUNTER — Ambulatory Visit: Payer: Medicare HMO

## 2020-04-22 DIAGNOSIS — E0842 Diabetes mellitus due to underlying condition with diabetic polyneuropathy: Secondary | ICD-10-CM

## 2020-04-22 DIAGNOSIS — E782 Mixed hyperlipidemia: Secondary | ICD-10-CM

## 2020-04-22 DIAGNOSIS — M1712 Unilateral primary osteoarthritis, left knee: Secondary | ICD-10-CM | POA: Diagnosis not present

## 2020-04-22 NOTE — Patient Instructions (Signed)
Visit Information  Goals Addressed            This Visit's Progress   . Pharmacy Care Plan       CARE PLAN ENTRY  Current Barriers:  . Chronic Disease Management support, education, and care coordination needs related to Hypertension and Diabetes   Hypertension . Pharmacist Clinical Goal(s): o Over the next 90 days, patient will work with PharmD and providers to maintain BP goal <140/90 . Current regimen:  o Amlodipine 5 mg daily, Losartan 100 mg daily . Interventions: o Recommend patient begin checking blood pressure at home each week and recording it daily between now and next visit with Dr. Tobie Poet.  o Patient has ordered a new meter to ensure home monitoring accuracy.  o Discussed caffeine affecting heart rate and blood pressure.  o Discussed appropriate technique for home bp monitoring.  o Discussed patient upcoming follow-up with Cardiologist.  o Recommended patient begin regular exercise for overall health and knee mobility.  . Patient self care activities - Over the next 90 days, patient will: o Check BP weekly, document, and provide at future appointments o Ensure daily salt intake < 2300 mg/day  Hyperlipidemia . Pharmacist Clinical Goal(s): o Over the next 90 days, patient will work with PharmD and providers to maintain LDL goal < 70 mg/dL o Ensure safety, efficacy, and affordability of medications . Current regimen:  o Pravastatin 20 mg - 4 times weekly o Coenzyme q10 200 mg daily o Omega 3 1000 mg daily . Interventions: o Recommend patient continue with healthy diet each day until next visit with Dr. Tobie Poet.  o Recommended incorporate regular exercise. Starting with 10 minutes 1 day each week and increasing as tolerated to an ultimate goal 30-45 minutes of 4-5 days weekly.  . Patient self care activities - Over the next 90 days, patient will: o Call provider with any concerns or issues, continue taking medication as prescribed each day and request prescription refills  from [facility] as needed.   Diabetes . Pharmacist Clinical Goal(s): o Over the next 90 days, patient will work with PharmD and providers to maintain A1c goal <7% . Current regimen:  o Levemir 5 units daily . Interventions: o Recommend patient continue to avoid using Levemir since low blood sugar. Patient to continue monitoring blood sugar each day and contact pharmacist if fasting blood sugar gets <70 mg/dL or >130 mg/dL.  o Discussed blood sugar readings with patient. Patient resumed Levemir on her own.  . Patient self care activities - Over the next 90 days, patient will: o Check blood sugar once daily, document, and provide at future appointments o Contact provider with any episodes of hypoglycemia  Medication management . Pharmacist Clinical Goal(s): o Over the next 90 days, patient will work with PharmD and providers to maintain optimal medication adherence . Current pharmacy: [facility] . Interventions o Comprehensive medication review performed. o Continue current medication management strategy . Patient self care activities - Over the next 90 days, patient will: o Focus on medication adherence by continuing to use pill box. o Take medications as prescribed o Report any questions or concerns to PharmD and/or provider(s)  Please see past updates related to this goal by clicking on the "Past Updates" button in the selected goal         The patient verbalized understanding of instructions provided today and declined a print copy of patient instruction materials.   Telephone follow up appointment with pharmacy team member scheduled  for: 07/2020  Sherre Poot, PharmD, [facility] Clinical Pharmacist [facility] [phone] (office) [phone] (mobile)   The Journal of Orthopaedic and Sports Physical Therapy, 44(10), 748. [email or web address]">  How to Increase Your Level of Physical Activity Getting regular physical  activity is important for your overall health and well-being. Most people do not get enough exercise. There are easy ways to increase your level of physical activity, even if you have not been very active in the past or if you are just starting out. How can increasing my physical activity affect me? Physical activity has many short-term and long-term benefits. Being active on a regular basis can improve your physical and mental health as well as provide other benefits. Physical health benefits  Helping you lose weight or maintain a healthy weight.  Strengthening your muscles and bones.  Reducing your risk of certain long-term (chronic) diseases, including heart disease, cancer, and diabetes.  Being able to move around more easily and for longer periods of time without getting tired (increased stamina).  Improving your ability to fight off illness (enhanced immunity).  Being able to sleep better.  Helping you stay healthy as you get older, including: ? Helping you stay mobile, or capable of walking and moving around. ? Preventing accidents, such as falls. ? Increasing life expectancy. Mental health benefits  Boosting your mood and improving your self-esteem.  Lowering your chance of having mental health problems, such as depression or anxiety.  Helping you feel good about your body. Other benefits  Finding new sources of fun and enjoyment.  Meeting new people who share a common interest. What steps can I take to be more physically active? Getting started  If you have a chronic illness or have not been active for a while, check with your health care provider about how to get started. Ask your health care provider what activities are safe for you.  Start out slowly. Walking or doing some simple chair exercises is a good place to start, especially if you have not been active before or for a long time.  Set goals that you can work toward. Ask your health care provider how much  exercise is best for you. In general, most adults should: ? Do moderate-intensity exercise for at least 150 minutes each week (30 minutes on most days of the week) or vigorous exercise for at least 75 minutes each week, or a combination of these.  Moderate-intensity exercise can include walking at a quick pace, biking, yoga, water aerobics, or gardening.  Vigorous exercise involves activities that take more effort, such as jogging or running, playing sports, swimming laps, or jumping rope. ? Do strength exercises on at least 2 days each week. This can include weight lifting, body weight exercises, and resistance-band exercises.  Consider using a fitness tracker, such as a mobile phone app or a device worn like a watch, that will count the number of steps you take each day. Many people strive to reach 10,000 steps a day. Choosing activities  Try to find activities that you enjoy. You are more likely to commit to an exercise routine if it does not feel like a chore.  If you have bone or joint problems, choose low-impact exercises, like walking or swimming.  Use these tips for being successful with an exercise plan: ? Find a workout partner for accountability. ? Join a group or class, such as an aerobics class, cycling class, or sports team. ? Make family time active. Go for a walk, bike,  or swim. ? Include a variety of exercises each week. Being active in your daily routines Besides your formal exercise plans, you can find ways to do physical activity during your daily routines, such as:  Walking or biking to work or to the store.  Taking the stairs instead of the elevator.  Parking farther away from the door at work or at the store.  Planning walking meetings.  Walking around while you are on the phone.  Where to find more information  Centers for Disease Control and Prevention: WorkDashboard.es  President's Council on Fitness, Sports & Nutrition:  www.fitness.gov  ChooseMyPlate: FormerBoss.no Contact a health care provider if:  You have headaches, muscle aches, or joint pain.  You feel dizzy or light-headed while exercising.  You faint.  You have chest pain while exercising. Summary  Exercise benefits your mind and body at any age, even if you are just starting out.  If you have a chronic illness or have not been active for a while, check with your health care provider before increasing your physical activity.  Choose activities that are safe and enjoyable for you. Ask your health care provider what activities are safe for you.  Start slowly. Tell your health care provider if you have problems as you start to increase your activity level. This information is not intended to replace advice given to you by your health care provider. Make sure you discuss any questions you have with your health care provider. Document Revised: 06/30/2019 Document Reviewed: 04/21/2019 Elsevier Patient Education  [place].

## 2020-04-28 ENCOUNTER — Other Ambulatory Visit: Payer: Self-pay | Admitting: Family Medicine

## 2020-04-28 ENCOUNTER — Telehealth: Payer: Self-pay

## 2020-04-28 ENCOUNTER — Other Ambulatory Visit: Payer: Self-pay | Admitting: Legal Medicine

## 2020-04-28 DIAGNOSIS — I5022 Chronic systolic (congestive) heart failure: Secondary | ICD-10-CM

## 2020-04-28 MED ORDER — MECLIZINE HCL 25 MG PO TABS: 25.0000 mg | ORAL_TABLET | Freq: Three times a day (TID) | ORAL | 0 refills | Status: DC | PRN

## 2020-04-28 NOTE — Telephone Encounter (Signed)
Patient left a voicemail requesting a rx be sent to her pharmacy for dizziness

## 2020-04-29 DIAGNOSIS — I739 Peripheral vascular disease, unspecified: Secondary | ICD-10-CM | POA: Diagnosis not present

## 2020-04-29 DIAGNOSIS — N189 Chronic kidney disease, unspecified: Secondary | ICD-10-CM | POA: Diagnosis not present

## 2020-04-29 DIAGNOSIS — Z95810 Presence of automatic (implantable) cardiac defibrillator: Secondary | ICD-10-CM | POA: Diagnosis not present

## 2020-04-29 DIAGNOSIS — I2510 Atherosclerotic heart disease of native coronary artery without angina pectoris: Secondary | ICD-10-CM | POA: Diagnosis not present

## 2020-04-29 DIAGNOSIS — E119 Type 2 diabetes mellitus without complications: Secondary | ICD-10-CM | POA: Diagnosis not present

## 2020-04-29 DIAGNOSIS — I361 Nonrheumatic tricuspid (valve) insufficiency: Secondary | ICD-10-CM | POA: Diagnosis not present

## 2020-04-29 DIAGNOSIS — I10 Essential (primary) hypertension: Secondary | ICD-10-CM | POA: Diagnosis not present

## 2020-04-29 DIAGNOSIS — I5023 Acute on chronic systolic (congestive) heart failure: Secondary | ICD-10-CM | POA: Diagnosis not present

## 2020-04-29 DIAGNOSIS — I071 Rheumatic tricuspid insufficiency: Secondary | ICD-10-CM | POA: Diagnosis not present

## 2020-04-29 DIAGNOSIS — R0602 Shortness of breath: Secondary | ICD-10-CM | POA: Diagnosis not present

## 2020-04-29 DIAGNOSIS — N281 Cyst of kidney, acquired: Secondary | ICD-10-CM | POA: Diagnosis not present

## 2020-04-29 DIAGNOSIS — I110 Hypertensive heart disease with heart failure: Secondary | ICD-10-CM | POA: Diagnosis not present

## 2020-04-29 DIAGNOSIS — J9601 Acute respiratory failure with hypoxia: Secondary | ICD-10-CM | POA: Diagnosis not present

## 2020-04-29 DIAGNOSIS — E785 Hyperlipidemia, unspecified: Secondary | ICD-10-CM | POA: Diagnosis not present

## 2020-04-29 DIAGNOSIS — J9811 Atelectasis: Secondary | ICD-10-CM | POA: Diagnosis not present

## 2020-04-29 DIAGNOSIS — I129 Hypertensive chronic kidney disease with stage 1 through stage 4 chronic kidney disease, or unspecified chronic kidney disease: Secondary | ICD-10-CM | POA: Diagnosis not present

## 2020-04-29 DIAGNOSIS — I700 Atherosclerosis of aorta: Secondary | ICD-10-CM | POA: Diagnosis not present

## 2020-04-29 DIAGNOSIS — I499 Cardiac arrhythmia, unspecified: Secondary | ICD-10-CM | POA: Diagnosis not present

## 2020-04-29 DIAGNOSIS — I340 Nonrheumatic mitral (valve) insufficiency: Secondary | ICD-10-CM | POA: Diagnosis not present

## 2020-04-29 DIAGNOSIS — I130 Hypertensive heart and chronic kidney disease with heart failure and stage 1 through stage 4 chronic kidney disease, or unspecified chronic kidney disease: Secondary | ICD-10-CM | POA: Diagnosis not present

## 2020-04-29 DIAGNOSIS — J90 Pleural effusion, not elsewhere classified: Secondary | ICD-10-CM | POA: Diagnosis not present

## 2020-04-29 DIAGNOSIS — I4891 Unspecified atrial fibrillation: Secondary | ICD-10-CM | POA: Diagnosis not present

## 2020-04-29 DIAGNOSIS — E039 Hypothyroidism, unspecified: Secondary | ICD-10-CM | POA: Diagnosis not present

## 2020-04-29 DIAGNOSIS — I272 Pulmonary hypertension, unspecified: Secondary | ICD-10-CM | POA: Diagnosis not present

## 2020-05-01 DIAGNOSIS — Z95810 Presence of automatic (implantable) cardiac defibrillator: Secondary | ICD-10-CM

## 2020-05-01 DIAGNOSIS — E785 Hyperlipidemia, unspecified: Secondary | ICD-10-CM

## 2020-05-01 DIAGNOSIS — I340 Nonrheumatic mitral (valve) insufficiency: Secondary | ICD-10-CM

## 2020-05-01 DIAGNOSIS — N189 Chronic kidney disease, unspecified: Secondary | ICD-10-CM

## 2020-05-01 DIAGNOSIS — I272 Pulmonary hypertension, unspecified: Secondary | ICD-10-CM

## 2020-05-01 DIAGNOSIS — E119 Type 2 diabetes mellitus without complications: Secondary | ICD-10-CM

## 2020-05-01 DIAGNOSIS — I10 Essential (primary) hypertension: Secondary | ICD-10-CM

## 2020-05-01 DIAGNOSIS — I071 Rheumatic tricuspid insufficiency: Secondary | ICD-10-CM

## 2020-05-01 DIAGNOSIS — I5023 Acute on chronic systolic (congestive) heart failure: Secondary | ICD-10-CM

## 2020-05-02 DIAGNOSIS — I5023 Acute on chronic systolic (congestive) heart failure: Secondary | ICD-10-CM | POA: Diagnosis not present

## 2020-05-02 DIAGNOSIS — Z95810 Presence of automatic (implantable) cardiac defibrillator: Secondary | ICD-10-CM | POA: Diagnosis not present

## 2020-05-02 DIAGNOSIS — I071 Rheumatic tricuspid insufficiency: Secondary | ICD-10-CM | POA: Diagnosis not present

## 2020-05-02 DIAGNOSIS — I340 Nonrheumatic mitral (valve) insufficiency: Secondary | ICD-10-CM | POA: Diagnosis not present

## 2020-05-03 ENCOUNTER — Telehealth: Payer: Self-pay | Admitting: Cardiology

## 2020-05-03 NOTE — Telephone Encounter (Signed)
[name] is calling stating she was released from the hospital yesterday and was advised to schedule a hospital f/u with Dr. Harriet Masson. Please advise.

## 2020-05-05 ENCOUNTER — Other Ambulatory Visit: Payer: Self-pay | Admitting: Family Medicine

## 2020-05-06 ENCOUNTER — Ambulatory Visit: Payer: Medicare HMO | Admitting: Family Medicine

## 2020-05-11 ENCOUNTER — Ambulatory Visit: Payer: Medicare HMO | Admitting: Cardiology

## 2020-05-11 ENCOUNTER — Other Ambulatory Visit: Payer: Self-pay

## 2020-05-11 ENCOUNTER — Encounter: Payer: Self-pay | Admitting: Cardiology

## 2020-05-11 VITALS — BP 126/60 | HR 86 | Ht 66.0 in | Wt 156.0 lb

## 2020-05-11 DIAGNOSIS — E785 Hyperlipidemia, unspecified: Secondary | ICD-10-CM | POA: Diagnosis not present

## 2020-05-11 DIAGNOSIS — E1169 Type 2 diabetes mellitus with other specified complication: Secondary | ICD-10-CM | POA: Diagnosis not present

## 2020-05-11 DIAGNOSIS — I442 Atrioventricular block, complete: Secondary | ICD-10-CM

## 2020-05-11 DIAGNOSIS — Z95810 Presence of automatic (implantable) cardiac defibrillator: Secondary | ICD-10-CM | POA: Diagnosis not present

## 2020-05-11 DIAGNOSIS — I428 Other cardiomyopathies: Secondary | ICD-10-CM | POA: Diagnosis not present

## 2020-05-11 DIAGNOSIS — E782 Mixed hyperlipidemia: Secondary | ICD-10-CM

## 2020-05-11 DIAGNOSIS — I4821 Permanent atrial fibrillation: Secondary | ICD-10-CM

## 2020-05-11 DIAGNOSIS — I5020 Unspecified systolic (congestive) heart failure: Secondary | ICD-10-CM | POA: Diagnosis not present

## 2020-05-11 NOTE — Addendum Note (Signed)
Addended by: Mendel Ryder on: 05/11/2020 12:08 PM   Modules accepted: Orders

## 2020-05-11 NOTE — Progress Notes (Signed)
Cardiology Office Note:    Date:  05/11/2020   ID:  [name], DOB Feb 07, 1934, MRN [id number]  PCP:  [name], MD  Cardiologist:  No primary care provider on file.  Electrophysiologist:  None   Referring MD: [name], MD   " I am doing fine"  History of Present Illness:    [name] is a 86 y.o. female with a hx of atrial fibrillation status post ablation, status post tri-BiV ICD placed in 2015 due to nonischemic cardiomyopathy, chronic systolic heart failure, hypertension, hyperlipidemia, history of CVA, peripheral vascular disease, renal artery stenosis, status post polymorphic V. tach which was related to sotalol therapy and amiodarone pulmonary toxicity.  The patient was recently hospitalized at [facility] and discharged on May 02, 2020 at that time and her hospitalization I did see the patient for congestive heart failure.  During her hospitalization an echocardiogram was done which showed her EF had decreased and was now 30 to 35%.  While in hospital she was diuresed and also her educations were adjusted to guideline medical therapy.  She is here today for follow-up visit.  Of note the patient does see Dr. Crissie Sickles in [place] but has been unable to see him recently.  Today she tells me that she is feeling a lot better since her hospitalization.  No other complaints at this time.  Past Medical History:  Diagnosis Date   Amiodarone pulmonary toxicity    Anemia    no GI bleeding; on iron   Arthritis    Atrial fibrillation (HCC)    Biventricular ICD (implantable cardiac defibrillator) in place    BiV ICD for nonischemic cardiomyopathy; BiV responder   Carotid artery occlusion    CHF (congestive heart failure) (HCC)    Chronic a-fib (HCC)    on xarelto; failed DCCV   Chronic kidney disease, stage 4 (severe) (HCC)    Diabetes (HCC)    Fibromyalgia    Full dentures    GAD (generalized anxiety disorder)     GERD (gastroesophageal reflux disease)    H/O cardiovascular stress test 08/04/2010   normal imaging; EF 61%   H/O echocardiogram 07/05/2010   EF 45-50%; aortic valve-mildly sclerotic; LA-mod dilaterd    Hyperlipemia    Hypertension    Hypertensive heart disease with heart failure (HCC)    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 renal dopp (12/18/08)- Right RA-<60%; L RA stent- open; nl size and shape in both kidneys;  b.  Rena Artery [name] (3/16):  SMA with > 70% stenosis, Bilateral prox RA with 1-59%   Renal insufficiency, mild    05/01/12 Creat 1.47   Restless leg syndrome    Type 2 diabetes mellitus with diabetic neuropathy (HCC)    VT (ventricular tachycardia) ([place]) 9/11   polymorphic VT probably related to sotalol therapy   Wears glasses     Past Surgical History:  Procedure Laterality Date   ABDOMINAL HYSTERECTOMY     APPENDECTOMY     AV NODE ABLATION  06/08/2006   performed by Dr. Beckie Salts; due to Afib with RVR and tachy brady syndrome   BIV ICD GENERATOR CHANGEOUT N/A 12/31/2017   Procedure: BIV ICD GENERATOR CHANGEOUT;  Surgeon: Evans Lance, MD;  Location: [place] [facility];  Service: Cardiovascular;  Laterality: N/A;   BIV ICD GENERTAOR CHANGE OUT  11/15/10   Medtronic Protecta [id number] serial #[id number]   BiV ICD Placement  06/17/2007; 04/20/14   Medtronic Concerta [id number]; RA lead-Med 5076/53cm, [id number]; RV lead- SJM 7121/65cm, [id number]; LV lead- Med 4194/88cm, [id number]; gen change 04/2014 by Dr Lovena Le   BIV PACEMAKER GENERATOR CHANGE OUT N/A 04/20/2014    Procedure: BIV PACEMAKER GENERATOR CHANGE OUT;  Surgeon: Evans Lance, MD;  Location: [facility] C[facility];  Service: Cardiovascular;  Laterality: N/A;   BRAIN MENINGIOMA EXCISION  2011   L frontal meningioma at [facility]     lumpectomy right   CARDIOVERSION  07/21/05   converted to sinus brady   CAROTID ENDARTERECTOMY Right 09/24/2018   with bovine pericardial patch angioplasty   CATARACT EXTRACTION W/ INTRAOCULAR LENS  IMPLANT, BILATERAL     CHOLECYSTECTOMY     Dr. Sherald Hess in [place] and Dr. Lyda Jester follows; surgical stricture post procedure with stent placement   COLONOSCOPY W/ BIOPSIES AND POLYPECTOMY     DILATION AND CURETTAGE OF UTERUS     ENDARTERECTOMY Right 09/24/2018   Procedure: ENDARTERECTOMY CAROTID RIGHT;  Surgeon: Angelia Mould, MD;  Location: [facility];  Service: Vascular;  Laterality: Right;   PV Angio  1994   L Renal artery stent     Current Medications: Current Meds  Medication Sig   ALPRAZolam (XANAX) 0.5 MG tablet TAKE ONE TABLET BY MOUTH EVERY NIGHT AT BEDTIME FOR INSOMNIA   Apple Cider Vinegar 188 MG CAPS Take 2 capsules by mouth daily.   aspirin 81 MG chewable tablet Chew 81 mg by mouth daily.   calcium carbonate (OSCAL) 1500 (600 Ca) MG TABS tablet Take by mouth.   digoxin (LANOXIN) 0.125 MG tablet TAKE 1/2 TABLET EVERY DAY (SUBSTITUTED FOR  DIGITEK)   DROPLET PEN NEEDLES 31G X 5 MM MISC USE AS DIRECTED WITH LEVEMIR   ezetimibe (ZETIA) 10 MG tablet Take 1 tablet (10 mg total) by mouth daily.   fenofibrate micronized (LOFIBRA) 134 MG capsule Take 1 capsule (134 mg total) by mouth daily.   fluticasone (FLONASE) 50 MCG/ACT nasal spray USE 2 SPRAYS IN EACH NOSTRIL EVERY DAY AS NEEDED   furosemide (LASIX) 40 MG tablet TAKE 1 TABLET TWICE DAILY   glucose blood (ACCU-CHEK SMARTVIEW) test strip CHECK BLOOD SUGAR TWICE DAILY   LEVEMIR FLEXTOUCH 100 UNIT/ML FlexPen INJECT  42 UNITS ONE TIME DAILY   levothyroxine (SYNTHROID)  50 MCG tablet TAKE 1 TABLET EVERY DAY   losartan (COZAAR) 100 MG tablet TAKE 1 TABLET EVERY DAY   magnesium oxide (MAG-OX) 400 MG tablet Take 400 mg by mouth 2 (two) times daily.   meclizine (ANTIVERT) 25 MG tablet Take 1 tablet (25 mg total) by mouth 3 (three) times daily as needed for dizziness.   Multiple Vitamin (MULTIVITAMIN WITH MINERALS) TABS tablet Take 1 tablet by mouth daily. Centrum Silver   olopatadine (PATADAY) 0.1 % ophthalmic solution Place 1 drop into both eyes daily.   omega-3 acid ethyl esters (LOVAZA) 1 g capsule Take 1 capsule (1 g total) by mouth 2 (two) times daily.   OVER THE COUNTER MEDICATION Take 2 tablets by mouth daily. Takes Beet Root tablets -  2 tablets daily   oxyCODONE-acetaminophen (PERCOCET) 7.5-325 MG tablet Take 1 tablet by mouth every 6 (six) hours as needed (back pain.).   polyethylene glycol (MIRALAX / GLYCOLAX) 17 g packet Take 17 g by mouth daily as needed.    pravastatin (PRAVACHOL) 10 MG tablet TAKE 1 TABLET FOUR TIMES WEEKLY   Rivaroxaban (XARELTO) 15 MG TABS tablet Take 1 tablet (15 mg total) by mouth daily with supper.   rOPINIRole (REQUIP) 1 MG tablet TAKE 1 TABLET AT BEDTIME   tiZANidine (ZANAFLEX) 4 MG tablet TAKE 1 TABLET (4 MG TOTAL) BY MOUTH AT BEDTIME.   [DISCONTINUED] Coenzyme Q10 200 MG capsule Take 200 mg by mouth daily.   [DISCONTINUED] glipiZIDE (GLUCOTROL) 5 MG tablet Take by mouth 2 (two) times daily before a meal.   Current Facility-Administered Medications for the 05/11/20 encounter (Office Visit) with Berniece Salines, DO  Medication   triamcinolone acetonide (KENALOG-40) injection 40 mg     Allergies:   Gabapentin, Pregabalin, Morphine, Penicillins, Tape, Ace inhibitors, Carvedilol, Insulin glargine, Metoprolol, Pantoprazole, Procaine, Statins, and Lisinopril   Social History   Socioeconomic History   Marital status: Married    Spouse name: Not on file   Number of children: 6   Years of education: Not on file     Highest education level: Not on file  Occupational History   Not on file  Tobacco Use   Smoking status: Never Smoker   Smokeless tobacco: Never Used  Vaping Use   Vaping Use: Never used  Substance and Sexual Activity   Alcohol use: No    Alcohol/week: 0.0 standard drinks   Drug use: No   Sexual activity: Not Currently  Other Topics Concern   Not on file  Social History Narrative   wears sunscreen, brushes and flosses daily, see's dentist bi-annually, has smoke/carbon monoxide detectors, wears a seatbelt and practices gun safety   Social Determinants of Health   Financial Resource Strain:    Difficulty of Paying Living Expenses:   Food Insecurity: No Food Insecurity   Worried About Charity fundraiser in the Last Year: Never true   Ran Out of Food in the Last Year: Never true  Transportation Needs: No Transportation Needs   Lack of Transportation (Medical): No   Lack of Transportation (Non-Medical): No  Physical Activity:    Days of Exercise per Week:    Minutes of Exercise per Session:   Stress:    Feeling of Stress :   Social Connections:    Frequency of Communication with Friends and Family:    Frequency of Social Gatherings with Friends and Family:    Attends Religious Services:    Active Member of Clubs or Organizations:    Attends Music therapist:    Marital Status:      Family History: The patient's family history includes AAA (abdominal aortic aneurysm) in her brother; Cancer in her father and sister; Dementia in her sister; Diabetes in an other family member; Heart attack in her brother; Heart failure in her mother; Hypertension in her mother; Stroke in her maternal grandmother.  ROS:   Review of Systems  Constitution: Negative for decreased appetite, fever and weight gain.  HENT: Negative for congestion, ear discharge, hoarse voice and sore throat.   Eyes: Negative for discharge, redness, vision loss in right eye and  visual halos.  Cardiovascular: Negative for chest pain, dyspnea on exertion, leg swelling, orthopnea and palpitations.  Respiratory: Negative for cough, hemoptysis, shortness of breath and  snoring.   Endocrine: Negative for heat intolerance and polyphagia.  Hematologic/Lymphatic: Negative for bleeding problem. Does not bruise/bleed easily.  Skin: Negative for flushing, nail changes, rash and suspicious lesions.  Musculoskeletal: Negative for arthritis, joint pain, muscle cramps, myalgias, neck pain and stiffness.  Gastrointestinal: Negative for abdominal pain, bowel incontinence, diarrhea and excessive appetite.  Genitourinary: Negative for decreased libido, genital sores and incomplete emptying.  Neurological: Negative for brief paralysis, focal weakness, headaches and loss of balance.  Psychiatric/Behavioral: Negative for altered mental status, depression and suicidal ideas.  Allergic/Immunologic: Negative for HIV exposure and persistent infections.    EKGs/Labs/Other Studies Reviewed:    The following studies were reviewed today:   EKG: Her EKG which was done at [facility] on April 30, 2019 show evidence of biventricular pacing.  Heart rate 70 bpm.  Recent Labs: 12/25/2019: ALT 18; BUN 39; Creatinine, Ser 1.48; Hemoglobin 11.6; Platelets 273; Potassium 4.2; Sodium 141; TSH 2.140  Recent Lipid Panel    Component Value Date/Time   CHOL 138 12/25/2019 1058   TRIG 55 12/25/2019 1058   HDL 58 12/25/2019 1058   CHOLHDL 2.4 12/25/2019 1058   LDLCALC 68 12/25/2019 1058    Physical Exam:    VS:  BP 126/60    Pulse 86    Ht 5\' 6"  (1.676 m)    Wt 156 lb (70.8 kg)    SpO2 96%    BMI 25.18 kg/m     Wt Readings from Last 3 Encounters:  05/11/20 156 lb (70.8 kg)  12/25/19 168 lb 12.8 oz (76.6 kg)  12/01/19 171 lb 6.4 oz (77.7 kg)     GEN: Well nourished, well developed in no acute distress HEENT: Normal NECK: No JVD; No carotid bruits LYMPHATICS: No lymphadenopathy CARDIAC:  S1S2 noted,RRR, no murmurs, rubs, gallops RESPIRATORY:  Clear to auscultation without rales, wheezing or rhonchi  ABDOMEN: Soft, non-tender, non-distended, +bowel sounds, no guarding. EXTREMITIES: No edema, No cyanosis, no clubbing MUSCULOSKELETAL:  No deformity  SKIN: Warm and dry NEUROLOGIC:  Alert and oriented x 3, non-focal PSYCHIATRIC:  Normal affect, good insight  ASSESSMENT:    1. HFrEF (heart failure with reduced ejection fraction) ([place])   2. Atrial fibrillation, permanent ([place])   3. Dyslipidemia associated with type 2 diabetes mellitus ([place])   4. Automatic implantable cardioverter-defibrillator in situ   5. Mixed hyperlipidemia   6. ATRIOVENTRICULAR BLOCK, 3RD DEGREE   7. NICM (nonischemic cardiomyopathy) ([place])    PLAN:    She has improved clinically from her recent acute exacerbation of heart failure.  She is now on medicines that were adjusted for guideline directed medical therapy.  She has had allergic reaction to beta-blockers.  I will continue the patient on losartan 100 mg daily, Aldactone 12.5 mg daily.  I do plan if her EF does not improve on this regimen to transition the patient to [facility].  Atrial fibrillation continue patient on her current dose of Xarelto.  Hypertension her blood pressure is acceptable in the office today no changes will be made to her current regimen.  Hyperlipidemia continue patient on her ezetimibe as well as Lovaza and pravastatin.  Blood work will be done to assess kidney function as well as electrolytes.  Her device were interrogated in June 2021 and the report was reviewed by me.  She plans to see Dr. Lovena Le soon.  The patient is in agreement with the above plan. The patient left the office in stable condition.  The patient will follow up in 3  months or sooner if needed.   Medication Adjustments/Labs and Tests Ordered: Current medicines are reviewed at length with the patient today.  Concerns regarding medicines are outlined above.    No orders of the defined types were placed in this encounter.  No orders of the defined types were placed in this encounter.   There are no Patient Instructions on file for this visit.   Adopting a Healthy Lifestyle.  Know what a healthy weight is for you (roughly BMI <25) and aim to maintain this   Aim for 7+ servings of fruits and vegetables daily   65-80+ fluid ounces of water or unsweet tea for healthy kidneys   Limit to max 1 drink of alcohol per day; avoid smoking/tobacco   Limit animal fats in diet for cholesterol and heart health - choose grass fed whenever available   Avoid highly processed foods, and foods high in saturated/trans fats   Aim for low stress - take time to unwind and care for your mental health   Aim for 150 min of moderate intensity exercise weekly for heart health, and weights twice weekly for bone health   Aim for 7-9 hours of sleep daily   When it comes to diets, agreement about the perfect plan isnt easy to find, even among the experts. Experts at the [employer] developed an idea known as the Healthy Eating Plate. Just imagine a plate divided into logical, healthy portions.   The emphasis is on diet quality:   Load up on vegetables and fruits - one-half of your plate: Aim for color and variety, and remember that potatoes dont count.   Go for whole grains - one-quarter of your plate: Whole wheat, barley, wheat berries, quinoa, oats, brown rice, and foods made with them. If you want pasta, go with whole wheat pasta.   Protein power - one-quarter of your plate: Fish, chicken, beans, and nuts are all healthy, versatile protein sources. Limit red meat.   The diet, however, does go beyond the plate, offering a few other suggestions.   Use healthy plant oils, such as olive, canola, soy, corn, sunflower and peanut. Check the labels, and avoid partially hydrogenated oil, which have unhealthy trans fats.   If youre thirsty, drink  water. Coffee and tea are good in moderation, but skip sugary drinks and limit milk and dairy products to one or two daily servings.   The type of carbohydrate in the diet is more important than the amount. Some sources of carbohydrates, such as vegetables, fruits, whole grains, and beans-are healthier than others.   Finally, stay active  Signed, Berniece Salines, DO  05/11/2020 11:52 AM    [place]

## 2020-05-11 NOTE — Patient Instructions (Signed)
Medication Instructions:  Your physician recommends that you continue on your current medications as directed. Please refer to the Current Medication list given to you today.  *If you need a refill on your cardiac medications before your next appointment, please call your pharmacy*   Lab Work: Bmp & Mag  If you have labs (blood work) drawn today and your tests are completely normal, you will receive your results only by: [facility] MyChart Message (if you have MyChart) OR . A paper copy in the mail If you have any lab test that is abnormal or we need to change your treatment, we will call you to review the results.   Testing/Procedures: None    Follow-Up: At [facility], you and your health needs are our priority.  As part of our continuing mission to provide you with exceptional heart care, we have created designated Provider Care Teams.  These Care Teams include your primary Cardiologist (physician) and Advanced Practice Providers (APPs -  Physician Assistants and Nurse Practitioners) who all work together to provide you with the care you need, when you need it.  We recommend signing up for the patient portal called "MyChart".  Sign up information is provided on this After Visit Summary.  MyChart is used to connect with patients for Virtual Visits (Telemedicine).  Patients are able to view lab/test results, encounter notes, upcoming appointments, etc.  Non-urgent messages can be sent to your provider as well.   To learn more about what you can do with MyChart, go to [email or web address].    Your next appointment:   3 month(s)  The format for your next appointment:   In Person  Provider:   Berniece Salines, DO   Other Instructions None

## 2020-05-12 ENCOUNTER — Telehealth: Payer: Self-pay

## 2020-05-12 ENCOUNTER — Ambulatory Visit: Payer: Medicare HMO | Admitting: Family Medicine

## 2020-05-12 LAB — BASIC METABOLIC PANEL
BUN/Creatinine Ratio: 28 (ref 12–28)
BUN: 54 mg/dL — ABNORMAL HIGH (ref 8–27)
CO2: 24 mmol/L (ref 20–29)
Calcium: 10.4 mg/dL — ABNORMAL HIGH (ref 8.7–10.3)
Chloride: 98 mmol/L (ref 96–106)
Creatinine, Ser: 1.95 mg/dL — ABNORMAL HIGH (ref 0.57–1.00)
GFR calc Af Amer: 27 mL/min/{1.73_m2} — ABNORMAL LOW (ref >59)
GFR calc non Af Amer: 23 mL/min/{1.73_m2} — ABNORMAL LOW (ref >59)
Glucose: 90 mg/dL (ref 65–99)
Potassium: 4.7 mmol/L (ref 3.5–5.2)
Sodium: 139 mmol/L (ref 134–144)

## 2020-05-12 LAB — MAGNESIUM: Magnesium: 2.6 mg/dL — ABNORMAL HIGH (ref 1.6–2.3)

## 2020-05-12 NOTE — Telephone Encounter (Signed)
-----   Message from Berniece Salines, DO sent at 05/12/2020  7:57 AM EDT ----- Creatinine is slightly elevated we will keep a watch on it.

## 2020-05-12 NOTE — Telephone Encounter (Signed)
Left message on patients voicemail to please return our call.   

## 2020-05-13 ENCOUNTER — Telehealth: Payer: Self-pay

## 2020-05-13 NOTE — Telephone Encounter (Signed)
-----   Message from Berniece Salines, DO sent at 05/12/2020  7:57 AM EDT ----- Creatinine is slightly elevated we will keep a watch on it.

## 2020-05-13 NOTE — Telephone Encounter (Signed)
Left message on patients voicemail to please return our call.   

## 2020-05-14 ENCOUNTER — Telehealth: Payer: Self-pay

## 2020-05-14 NOTE — Telephone Encounter (Signed)
-----   Message from Berniece Salines, DO sent at 05/12/2020  7:57 AM EDT ----- Creatinine is slightly elevated we will keep a watch on it.

## 2020-05-14 NOTE — Telephone Encounter (Signed)
Left message on patients voicemail to please return our call.   

## 2020-05-19 ENCOUNTER — Other Ambulatory Visit: Payer: Self-pay

## 2020-05-19 MED ORDER — OXYCODONE-ACETAMINOPHEN 7.5-325 MG PO TABS: 1.0000 | ORAL_TABLET | Freq: Four times a day (QID) | ORAL | 0 refills | Status: DC | PRN

## 2020-05-19 MED ORDER — ALPRAZOLAM 0.5 MG PO TABS: ORAL_TABLET | ORAL | 2 refills | Status: DC

## 2020-05-20 ENCOUNTER — Other Ambulatory Visit: Payer: Self-pay | Admitting: Family Medicine

## 2020-05-25 ENCOUNTER — Ambulatory Visit (INDEPENDENT_AMBULATORY_CARE_PROVIDER_SITE_OTHER): Payer: Medicare HMO | Admitting: Family Medicine

## 2020-05-25 ENCOUNTER — Other Ambulatory Visit: Payer: Self-pay

## 2020-05-25 VITALS — BP 124/72 | HR 70 | Temp 96.2°F | Ht 66.0 in | Wt 157.4 lb

## 2020-05-25 DIAGNOSIS — E1142 Type 2 diabetes mellitus with diabetic polyneuropathy: Secondary | ICD-10-CM | POA: Diagnosis not present

## 2020-05-25 DIAGNOSIS — E1169 Type 2 diabetes mellitus with other specified complication: Secondary | ICD-10-CM

## 2020-05-25 DIAGNOSIS — E782 Mixed hyperlipidemia: Secondary | ICD-10-CM

## 2020-05-25 DIAGNOSIS — I5023 Acute on chronic systolic (congestive) heart failure: Secondary | ICD-10-CM | POA: Diagnosis not present

## 2020-05-25 DIAGNOSIS — I270 Primary pulmonary hypertension: Secondary | ICD-10-CM | POA: Diagnosis not present

## 2020-05-25 DIAGNOSIS — E038 Other specified hypothyroidism: Secondary | ICD-10-CM

## 2020-05-25 DIAGNOSIS — E0842 Diabetes mellitus due to underlying condition with diabetic polyneuropathy: Secondary | ICD-10-CM

## 2020-05-25 DIAGNOSIS — J9601 Acute respiratory failure with hypoxia: Secondary | ICD-10-CM | POA: Diagnosis not present

## 2020-05-25 DIAGNOSIS — E785 Hyperlipidemia, unspecified: Secondary | ICD-10-CM | POA: Diagnosis not present

## 2020-05-25 DIAGNOSIS — I5022 Chronic systolic (congestive) heart failure: Secondary | ICD-10-CM

## 2020-05-25 NOTE — Patient Instructions (Signed)
Heart Failure, Self Care Heart failure is a serious condition. This sheet explains things you need to do to take care of yourself at home. To help you stay as healthy as possible, you may be asked to change your diet, take certain medicines, and make other changes in your life. Your doctor may also give you more specific instructions. If you have problems or questions, call your doctor. What are the risks? Having heart failure makes it more likely for you to have some problems. These problems can get worse if you do not take good care of yourself. Problems may include:  Blood clotting problems. This may cause a stroke.  Damage to the kidneys, liver, or lungs.  Abnormal heart rhythms. Supplies needed:  Scale for weighing yourself.  Blood pressure monitor.  Notebook.  Medicines. How to care for yourself when you have heart failure Medicines Take over-the-counter and prescription medicines only as told by your doctor. Take your medicines every day.  Do not stop taking your medicine unless your doctor tells you to do so.  Do not skip any medicines.  Get your prescriptions refilled before you run out of medicine. This is important. Eating and drinking   Eat heart-healthy foods. Talk with a diet specialist (dietitian) to create an eating plan.  Choose foods that: ? Have no trans fat. ? Are low in saturated fat and cholesterol.  Choose healthy foods, such as: ? Fresh or frozen fruits and vegetables. ? Fish. ? Low-fat (lean) meats. ? Legumes, such as beans, peas, and lentils. ? Fat-free or low-fat dairy products. ? Whole-grain foods. ? High-fiber foods.  Limit salt (sodium) if told by your doctor. Ask your diet specialist to tell you which seasonings are healthy for your heart.  Cook in healthy ways instead of frying. Healthy ways of cooking include roasting, grilling, broiling, baking, poaching, steaming, and stir-frying.  Limit how much fluid you drink, if told by your  doctor.  Check weight and blood pressure   Weigh yourself every day. This will help you to know if fluid is building up in your body. ? Weigh yourself every morning after you pee (urinate) and before you eat breakfast. ? Wear the same amount of clothing each time. ? Write down your daily weight. Give your record to your doctor.  Check and write down your blood pressure as told by your doctor.  Check your pulse as told by your doctor. Dealing with very hot and very cold weather  If it is very hot: ? Avoid activities that take a lot of energy. ? Use air conditioning or fans, or find a cooler place. ? Avoid caffeine and alcohol. ? Wear clothing that is loose-fitting, lightweight, and light-colored.  If it is very cold: ? Avoid activities that take a lot of energy. ? Layer your clothes. ? Wear mittens or gloves, a hat, and a scarf when you go outside. ? Avoid alcohol. Follow these instructions at home:  Stay up to date with shots (vaccines). Get pneumococcal and flu (influenza) shots.  Keep all follow-up visits as told by your doctor. This is important. Contact a doctor if:  You gain weight quickly.  You have increasing shortness of breath.  You cannot do your normal activities.  You get tired easily.  You cough a lot.  You don't feel like eating or feel like you may vomit (nauseous).  You become puffy (swell) in your hands, feet, ankles, or belly (abdomen).  You cannot sleep well because it is hard  to breathe.  You feel like your heart is beating fast (palpitations).  You get dizzy when you stand up. Get help right away if:  You have trouble breathing.  You or someone else notices a change in your behavior, such as having trouble staying awake.  You have chest pain or discomfort.  You pass out (faint). These symptoms may be an emergency. Do not wait to see if the symptoms will go away. Get medical help right away. Call your local emergency services (911 in  the [place] Do not drive yourself to the hospital. Summary  Heart failure is a serious condition. To care for yourself, you may have to change your diet, take medicines, and make other lifestyle changes.  Take your medicines every day. Do not stop taking them unless your doctor tells you to do so.  Eat heart-healthy foods, such as fresh or frozen fruits and vegetables, fish, lean meats, legumes, fat-free or low-fat dairy products, and whole-grain or high-fiber foods.  Ask your doctor if you can drink alcohol. You may have to stop alcohol use if you have very bad heart failure.  Contact your doctor if you gain weight quickly or feel that your heart is beating too fast. Get help right away if you pass out, or have chest pain or trouble breathing. This information is not intended to replace advice given to you by your health care provider. Make sure you discuss any questions you have with your health care provider. Document Revised: 01/07/2019 Document Reviewed: 01/08/2019 Elsevier Patient Education  [place].

## 2020-05-25 NOTE — Progress Notes (Signed)
Subjective:  Patient ID: [name], female    DOB: May 22, 1934  Age: 86 y.o. MRN: [id number]  Chief Complaint  Patient presents with  . Congestive Heart Failure    HPI Patient is an 86 year old white female with past medical history of congestive heart failure, atrial fibrillation status post AV node ablation, status post Medtronic BiV ICD placed in 2015 due to nonischemic cardiomyopathy, hypertension, hyperlipidemia, and stroke, history of pulmonary embolism who presented to the emergency department on April 29, 2020 complaining of shortness of breath for 1 week.  She reports that it is progressively gotten worse over the 2 days prior to admission.  Cardiology was consulted.  On admission her proBNP was 6520 and her chest x-ray showed pulmonary edema.  CT scan showed a pleural effusion.  Procalcitonin was negative.  EKG showed ventricular pacing.  Echocardiogram was done during her admission which showed severe global hypokinesis of the left ventricle and moderate to severe pulmonary hypertension.  The patient was diuresed intravenously quite successfully.  She was discharged on Lasix 40 mg 1 twice daily and spironolactone 12.5 mg once daily.  She is scheduled to follow-up with cardiology.  Final diagnosis: Acute respiratory failure with hypoxemia requiring O2 per nasal cannula which was weaned prior to her discharge.  Acute on chronic systolic congestive heart failure which was resolved to baseline with diuresis.  Cardiology to consider changing or adding Entresto.  All other medical issues were stable.  Current Outpatient Medications on File Prior to Visit  Medication Sig Dispense Refill  . spironolactone (ALDACTONE) 25 MG tablet Take 12.5 mg by mouth daily.    Marland Kitchen ALPRAZolam (XANAX) 0.5 MG tablet TAKE ONE TABLET BY MOUTH EVERY NIGHT AT BEDTIME FOR INSOMNIA 30 tablet 2  . Apple Cider Vinegar 188 MG CAPS Take 2 capsules by mouth daily.    Marland Kitchen aspirin 81 MG chewable tablet Chew 81  mg by mouth daily.    . calcium carbonate (OSCAL) 1500 (600 Ca) MG TABS tablet Take by mouth.    . digoxin (LANOXIN) 0.125 MG tablet TAKE 1/2 TABLET EVERY DAY (SUBSTITUTED FOR  DIGITEK) 45 tablet 1  . DROPLET PEN NEEDLES 31G X 5 MM MISC USE AS DIRECTED WITH LEVEMIR 300 each 3  . ezetimibe (ZETIA) 10 MG tablet Take 1 tablet (10 mg total) by mouth daily. 90 tablet 1  . fenofibrate micronized (LOFIBRA) 134 MG capsule TAKE 1 CAPSULE EVERY DAY 90 capsule 0  . fluticasone (FLONASE) 50 MCG/ACT nasal spray USE 2 SPRAYS IN EACH NOSTRIL EVERY DAY AS NEEDED 48 g 3  . furosemide (LASIX) 40 MG tablet TAKE 1 TABLET TWICE DAILY 180 tablet 0  . glucose blood (ACCU-CHEK SMARTVIEW) test strip CHECK BLOOD SUGAR TWICE DAILY 200 strip 3  . LEVEMIR FLEXTOUCH 100 UNIT/ML FlexPen INJECT  42 UNITS ONE TIME DAILY 45 mL 0  . levothyroxine (SYNTHROID) 50 MCG tablet TAKE 1 TABLET EVERY DAY 90 tablet 1  . losartan (COZAAR) 100 MG tablet TAKE 1 TABLET EVERY DAY 90 tablet 1  . magnesium oxide (MAG-OX) 400 MG tablet Take 400 mg by mouth 2 (two) times daily.    . meclizine (ANTIVERT) 25 MG tablet Take 1 tablet (25 mg total) by mouth 3 (three) times daily as needed for dizziness. 30 tablet 0  . Multiple Vitamin (MULTIVITAMIN WITH MINERALS) TABS tablet Take 1 tablet by mouth daily. Centrum Silver    . olopatadine (PATADAY) 0.1 % ophthalmic solution Place 1 drop into both eyes daily.    Marland Kitchen  omega-3 acid ethyl esters (LOVAZA) 1 g capsule Take 1 capsule (1 g total) by mouth 2 (two) times daily. 180 capsule 1  . OVER THE COUNTER MEDICATION Take 2 tablets by mouth daily. Takes Beet Root tablets - 2 tablets daily    . oxyCODONE-acetaminophen (PERCOCET) 7.5-325 MG tablet Take 1 tablet by mouth every 6 (six) hours as needed (back pain.). 120 tablet 0  . polyethylene glycol (MIRALAX / GLYCOLAX) 17 g packet Take 17 g by mouth daily as needed.     . Rivaroxaban (XARELTO) 15 MG TABS tablet Take 1 tablet (15 mg total) by mouth daily with supper.  90 tablet 3  . rOPINIRole (REQUIP) 1 MG tablet TAKE 1 TABLET AT BEDTIME 90 tablet 2  . tiZANidine (ZANAFLEX) 4 MG tablet TAKE 1 TABLET (4 MG TOTAL) BY MOUTH AT BEDTIME. 90 tablet 0   Current Facility-Administered Medications on File Prior to Visit  Medication Dose Route Frequency Provider Last Rate Last Admin  . triamcinolone acetonide (KENALOG-40) injection 40 mg  40 mg Intra-articular Once [name], MD       Past Medical History:  Diagnosis Date  . Amiodarone pulmonary toxicity   . Anemia    no GI bleeding; on iron  . Arthritis   . Atrial fibrillation ([place])   . Biventricular ICD (implantable cardiac defibrillator) in place    BiV ICD for nonischemic cardiomyopathy; BiV responder  . Carotid artery occlusion   . CHF (congestive heart failure) ([place])   . Chronic a-fib ([place])    on xarelto; failed DCCV  . Chronic kidney disease, stage 4 (severe) ([place])   . Diabetes ([place])   . Fibromyalgia   . Full dentures   . GAD (generalized anxiety disorder)   . GERD (gastroesophageal reflux disease)   . H/O cardiovascular stress test 08/04/2010   normal imaging; EF 61%  . H/O echocardiogram 07/05/2010   EF 45-50%; aortic valve-mildly sclerotic; LA-mod dilaterd   . Hyperlipemia   . Hypertension   . Hypertensive heart disease with heart failure ([place])   . Hypothalamic disease ([place])    on thyroid supplement  . Hypothyroidism   . Hypothyroidism   . LBBB (left bundle branch block)   . Mixed hyperlipidemia   . Occlusion and stenosis of right carotid artery   . Other persistent atrial fibrillation ([place])   . Pneumonia   . Presence of automatic (implantable) cardiac defibrillator   . PVD (peripheral vascular disease) ([place])    L RA stent 1994  . RAS (renal artery stenosis) ([place])    a. renal dopp (12/18/08)- Right RA-<60%; L RA stent- open; nl size and shape in both kidneys;  b.  Rena Artery [name] (3/16):  SMA with > 70% stenosis, Bilateral prox RA with 1-59%  . Renal insufficiency, mild     05/01/12 Creat 1.47  . Restless leg syndrome   . Type 2 diabetes mellitus with diabetic neuropathy ([place])   . VT (ventricular tachycardia) ([place]) 9/11   polymorphic VT probably related to sotalol therapy  . Wears glasses    Past Surgical History:  Procedure Laterality Date  . ABDOMINAL HYSTERECTOMY    . APPENDECTOMY    . AV NODE ABLATION  06/08/2006   performed by Dr. Beckie Salts; due to Afib with RVR and tachy brady syndrome  . BIV ICD GENERATOR CHANGEOUT N/A 12/31/2017   Procedure: BIV ICD GENERATOR CHANGEOUT;  Surgeon: Evans Lance, MD;  Location: [place] [facility];  Service: Cardiovascular;  Laterality: N/A;  .  BIV ICD GENERTAOR CHANGE OUT  11/15/10   Medtronic Protecta [id number] serial #[id number]  . BiV ICD Placement  06/17/2007; 04/20/14   Medtronic Concerta [id number]; RA lead-Med 5076/53cm, [id number]; RV lead- SJM 7121/65cm, [id number]; LV lead- Med 4194/88cm, [id number]; gen change 04/2014 by Dr Lovena Le  . BIV PACEMAKER GENERATOR CHANGE OUT N/A 04/20/2014   Procedure: BIV PACEMAKER GENERATOR CHANGE OUT;  Surgeon: Evans Lance, MD;  Location: [facility] CATH LAB;  Service: Cardiovascular;  Laterality: N/A;  . BRAIN MENINGIOMA EXCISION  2011   L frontal [facility]   . BREAST SURGERY     lumpectomy right  . CARDIOVERSION  07/21/05   converted to sinus brady  . CAROTID ENDARTERECTOMY Right 09/24/2018   with bovine pericardial patch angioplasty  . CATARACT EXTRACTION W/ INTRAOCULAR LENS  IMPLANT, BILATERAL    . CHOLECYSTECTOMY     Dr. Sherald Hess in [place] and Dr. Lyda Jester follows; surgical stricture post procedure with stent placement  . COLONOSCOPY W/ BIOPSIES AND POLYPECTOMY    . DILATION AND CURETTAGE OF UTERUS    . ENDARTERECTOMY Right 09/24/2018   Procedure: ENDARTERECTOMY CAROTID RIGHT;  Surgeon: Angelia Mould, MD;  Location: [place];  Service: Vascular;  Laterality: Right;  . PV Angio  1994   L Renal artery stent     Family History  Problem Relation Age of  Onset  . Heart failure Mother   . Hypertension Mother   . Cancer Father        lung  . Dementia Sister   . Cancer Sister        pancreatic, renal and thyroid  . Heart attack Brother   . AAA (abdominal aortic aneurysm) Brother   . Stroke Maternal Grandmother   . Diabetes Other    Social History   Socioeconomic History  . Marital status: Married    Spouse name: Not on file  . Number of children: 6  . Years of education: Not on file  . Highest education level: Not on file  Occupational History  . Not on file  Tobacco Use  . Smoking status: Never Smoker  . Smokeless tobacco: Never Used  Vaping Use  . Vaping Use: Never used  Substance and Sexual Activity  . Alcohol use: No    Alcohol/week: 0.0 standard drinks  . Drug use: No  . Sexual activity: Not Currently  Other Topics Concern  . Not on file  Social History Narrative   wears sunscreen, brushes and flosses daily, see's dentist bi-annually, has smoke/carbon monoxide detectors, wears a seatbelt and practices gun safety   Social Determinants of Health   Financial Resource Strain:   . Difficulty of Paying Living Expenses: Not on file  Food Insecurity: No Food Insecurity  . Worried About Charity fundraiser in the Last Year: Never true  . Ran Out of Food in the Last Year: Never true  Transportation Needs: No Transportation Needs  . Lack of Transportation (Medical): No  . Lack of Transportation (Non-Medical): No  Physical Activity:   . Days of Exercise per Week: Not on file  . Minutes of Exercise per Session: Not on file  Stress:   . Feeling of Stress : Not on file  Social Connections:   . Frequency of Communication with Friends and Family: Not on file  . Frequency of Social Gatherings with Friends and Family: Not on file  . Attends Religious Services: Not on file  . Active Member of Clubs or Organizations: Not on file  .  Attends Archivist Meetings: Not on file  . Marital Status: Not on file    Review  of Systems  Constitutional: Negative for chills, fatigue and fever.  HENT: Negative for congestion, rhinorrhea and sore throat.   Respiratory: Positive for shortness of breath. Negative for cough.   Cardiovascular: Negative for chest pain, palpitations and leg swelling.  Gastrointestinal: Negative for abdominal pain, constipation, diarrhea, nausea and vomiting.  Genitourinary: Negative for dysuria and urgency.  Musculoskeletal: Negative for back pain and myalgias.  Neurological: Positive for weakness. Negative for dizziness, light-headedness and headaches.  Psychiatric/Behavioral: Negative for dysphoric mood. The patient is not nervous/anxious.      Objective:  BP 124/72   Pulse 70   Temp (!) 96.2 F (35.7 C)   Ht 5\' 6"  (1.676 m)   Wt 157 lb 6.4 oz (71.4 kg)   BMI 25.41 kg/m   BP/Weight 05/28/2020 5/36/6440 12/11/7423  Systolic BP 956 387 564  Diastolic BP 57 72 60  Wt. (Lbs) 153.2 157.4 156  BMI 24.73 25.41 25.18    Physical Exam Vitals reviewed.  Constitutional:      Appearance: Normal appearance. She is obese.  Cardiovascular:     Rate and Rhythm: Normal rate. Rhythm irregular.  Pulmonary:     Effort: Pulmonary effort is normal. No respiratory distress.     Breath sounds: Normal breath sounds.  Abdominal:     General: Abdomen is flat. Bowel sounds are normal.     Palpations: Abdomen is soft.     Tenderness: There is no abdominal tenderness.  Musculoskeletal:     Right lower leg: Edema (1 +) present.     Left lower leg: Edema (1 +) present.  Neurological:     Mental Status: She is alert and oriented to person, place, and time.  Psychiatric:        Mood and Affect: Mood normal.        Behavior: Behavior normal.      Lab Results  Component Value Date   WBC 5.7 05/25/2020   HGB 11.6 05/25/2020   HCT 36.7 05/25/2020   PLT 324 05/25/2020   GLUCOSE 99 05/25/2020   CHOL 142 05/25/2020   TRIG 80 05/25/2020   HDL 48 05/25/2020   LDLCALC 78 05/25/2020   ALT 15  05/25/2020   AST 21 05/25/2020   NA 138 05/25/2020   K 5.4 (H) 05/25/2020   CL 101 05/25/2020   CREATININE 1.72 (H) 05/25/2020   BUN 54 (H) 05/25/2020   CO2 28 05/25/2020   TSH 2.440 05/25/2020   INR 0.96 09/24/2018   HGBA1C 6.2 (H) 05/25/2020   MICROALBUR 10 12/25/2019      Assessment & Plan:   1. Acute on chronic clinical systolic heart failure ([place])    Resolved. The current medical regimen is effective;  continue present plan and medications. Follow up with cardiology as scheduled..Considering entresto.  2. Acute respiratory failure with hypoxia ([place])  Resolved prior to discharge  3. Pulmonary hypertension, primary ([place])  Chronic issue. Defer tp cardiology for any treatment.  4. Mixed hyperlipidemia Fairly well controlled.  No changes to medicines.  Continue to work on eating a healthy diet and exercise.  Labs drawn today.  - Comprehensive metabolic panel - Lipid panel - Cardiovascular Risk Assessment  5. Diabetic polyneuropathy associated with type 2 diabetes mellitus (HCC) Control: great Recommend check sugars fasting daily. Recommend check feet daily. Recommend annual eye exams. Medicines: no changes Continue to work on eating a healthy  diet and exercise.  Labs drawn today.   - CBC with Differential/Platelet - Hemoglobin A1c  6. Secondary hypothyroidism - TSH    Orders Placed This Encounter  Procedures  . CBC with Differential/Platelet  . Comprehensive metabolic panel  . Hemoglobin A1c  . Lipid panel  . TSH  . Cardiovascular Risk Assessment     Follow-up: Return in about 3 months (around 08/25/2020) for fasting.Marland Kitchen  An After Visit Summary was printed and given to the patient.  [name] [facility] [phone]

## 2020-05-26 ENCOUNTER — Other Ambulatory Visit: Payer: Self-pay | Admitting: Family Medicine

## 2020-05-26 ENCOUNTER — Ambulatory Visit: Payer: Medicare HMO | Admitting: Family Medicine

## 2020-05-26 LAB — CBC WITH DIFFERENTIAL/PLATELET
Basophils Absolute: 0.1 10*3/uL (ref 0.0–0.2)
Basos: 1 %
EOS (ABSOLUTE): 0.1 10*3/uL (ref 0.0–0.4)
Eos: 3 %
Hematocrit: 36.7 % (ref 34.0–46.6)
Hemoglobin: 11.6 g/dL (ref 11.1–15.9)
Immature Grans (Abs): 0 10*3/uL (ref 0.0–0.1)
Immature Granulocytes: 0 %
Lymphocytes Absolute: 1.1 10*3/uL (ref 0.7–3.1)
Lymphs: 19 %
MCH: 26.8 pg (ref 26.6–33.0)
MCHC: 31.6 g/dL (ref 31.5–35.7)
MCV: 85 fL (ref 79–97)
Monocytes Absolute: 0.5 10*3/uL (ref 0.1–0.9)
Monocytes: 8 %
Neutrophils Absolute: 3.9 10*3/uL (ref 1.4–7.0)
Neutrophils: 69 %
Platelets: 324 10*3/uL (ref 150–450)
RBC: 4.33 x10E6/uL (ref 3.77–5.28)
RDW: 13.5 % (ref 11.7–15.4)
WBC: 5.7 10*3/uL (ref 3.4–10.8)

## 2020-05-26 LAB — COMPREHENSIVE METABOLIC PANEL
ALT: 15 IU/L (ref 0–32)
AST: 21 IU/L (ref 0–40)
Albumin/Globulin Ratio: 2.2 (ref 1.2–2.2)
Albumin: 4.2 g/dL (ref 3.6–4.6)
Alkaline Phosphatase: 33 IU/L — ABNORMAL LOW (ref 48–121)
BUN/Creatinine Ratio: 31 — ABNORMAL HIGH (ref 12–28)
BUN: 54 mg/dL — ABNORMAL HIGH (ref 8–27)
Bilirubin Total: 0.3 mg/dL (ref 0.0–1.2)
CO2: 28 mmol/L (ref 20–29)
Calcium: 9.7 mg/dL (ref 8.7–10.3)
Chloride: 101 mmol/L (ref 96–106)
Creatinine, Ser: 1.72 mg/dL — ABNORMAL HIGH (ref 0.57–1.00)
GFR calc Af Amer: 31 mL/min/{1.73_m2} — ABNORMAL LOW (ref >59)
GFR calc non Af Amer: 27 mL/min/{1.73_m2} — ABNORMAL LOW (ref >59)
Globulin, Total: 1.9 g/dL (ref 1.5–4.5)
Glucose: 99 mg/dL (ref 65–99)
Potassium: 5.4 mmol/L — ABNORMAL HIGH (ref 3.5–5.2)
Sodium: 138 mmol/L (ref 134–144)
Total Protein: 6.1 g/dL (ref 6.0–8.5)

## 2020-05-26 LAB — LIPID PANEL
Chol/HDL Ratio: 3 ratio (ref 0.0–4.4)
Cholesterol, Total: 142 mg/dL (ref 100–199)
HDL: 48 mg/dL (ref >39)
LDL Chol Calc (NIH): 78 mg/dL (ref 0–99)
Triglycerides: 80 mg/dL (ref 0–149)
VLDL Cholesterol Cal: 16 mg/dL (ref 5–40)

## 2020-05-26 LAB — HEMOGLOBIN A1C
Est. average glucose Bld gHb Est-mCnc: 131 mg/dL
Hgb A1c MFr Bld: 6.2 % — ABNORMAL HIGH (ref 4.8–5.6)

## 2020-05-26 LAB — TSH: TSH: 2.44 u[IU]/mL (ref 0.450–4.500)

## 2020-05-26 LAB — CARDIOVASCULAR RISK ASSESSMENT

## 2020-05-27 ENCOUNTER — Ambulatory Visit: Payer: Medicare HMO | Admitting: Family Medicine

## 2020-05-28 ENCOUNTER — Telehealth (INDEPENDENT_AMBULATORY_CARE_PROVIDER_SITE_OTHER): Payer: Medicare HMO | Admitting: Internal Medicine

## 2020-05-28 ENCOUNTER — Encounter: Payer: Self-pay | Admitting: Internal Medicine

## 2020-05-28 DIAGNOSIS — I5022 Chronic systolic (congestive) heart failure: Secondary | ICD-10-CM | POA: Diagnosis not present

## 2020-05-28 DIAGNOSIS — Z95810 Presence of automatic (implantable) cardiac defibrillator: Secondary | ICD-10-CM | POA: Diagnosis not present

## 2020-05-28 DIAGNOSIS — I482 Chronic atrial fibrillation, unspecified: Secondary | ICD-10-CM | POA: Diagnosis not present

## 2020-05-28 NOTE — Progress Notes (Signed)
Electrophysiology TeleHealth Note   Due to national recommendations of social distancing due to COVID 19, an audio/video telehealth visit is felt to be most appropriate for this patient at this time.  See MyChart message from today for the patient's consent to telehealth for [employer]   Date:  05/28/2020   ID:  [name], DOB 1934-04-20, MRN [id number]  Location: patient's home  Provider location: [address] [place]  Evaluation Performed: Follow-up visit  PCP:  [name], MD  Cardiologist:  No primary care provider on file. [place] Electrophysiologist:  Dr Lovena Le  Chief Complaint:  "I am doing better."  History of Present Illness:    [name] is a 86 y.o. female who presents via audio/video conferencing for a telehealth visit today.  Since last being seen in our clinic, the patient reports being admitted to [facility] with CHF. She was kept in the hospital for 3 days and her weight came down and she was breathing much better. She denies chest pain. Her weight has been between 150-155. She denies dietary indiscretion. She  Denies syncope or chest pain. She has class 2 dyspna.   Past Medical History:  Diagnosis Date  . Amiodarone pulmonary toxicity   . Anemia    no GI bleeding; on iron  . Arthritis   . Atrial fibrillation ([place])   . Biventricular ICD (implantable cardiac defibrillator) in place    BiV ICD for nonischemic cardiomyopathy; BiV responder  . Carotid artery occlusion   . CHF (congestive heart failure) ([place])   . Chronic a-fib ([place])    on xarelto; failed DCCV  . Chronic kidney disease, stage 4 (severe) ([place])   . Diabetes ([place])   . Fibromyalgia   . Full dentures   . GAD (generalized anxiety disorder)   . GERD (gastroesophageal reflux disease)   . H/O cardiovascular stress test 08/04/2010   normal imaging; EF 61%  . H/O echocardiogram 07/05/2010   EF 45-50%; aortic valve-mildly sclerotic; LA-mod  dilaterd   . Hyperlipemia   . Hypertension   . Hypertensive heart disease with heart failure ([place])   . Hypothalamic disease ([place])    on thyroid supplement  . Hypothyroidism   . Hypothyroidism   . LBBB (left bundle branch block)   . Mixed hyperlipidemia   . Occlusion and stenosis of right carotid artery   . Other persistent atrial fibrillation ([place])   . Pneumonia   . Presence of automatic (implantable) cardiac defibrillator   . PVD (peripheral vascular disease) ([place])    L RA stent 1994  . RAS (renal artery stenosis) ([place])    a. renal dopp (12/18/08)- Right RA-<60%; L RA stent- open; nl size and shape in both kidneys;  b.  Rena Artery [name] (3/16):  SMA with > 70% stenosis, Bilateral prox RA with 1-59%  . Renal insufficiency, mild    05/01/12 Creat 1.47  . Restless leg syndrome   . Type 2 diabetes mellitus with diabetic neuropathy ([place])   . VT (ventricular tachycardia) ([place]) 9/11   polymorphic VT probably related to sotalol therapy  . Wears glasses     Past Surgical History:  Procedure Laterality Date  . ABDOMINAL HYSTERECTOMY    . APPENDECTOMY    . AV NODE ABLATION  06/08/2006   performed by Dr. Beckie Salts; due to Afib with RVR and tachy brady syndrome  . BIV ICD GENERATOR CHANGEOUT N/A 12/31/2017   Procedure: BIV ICD GENERATOR CHANGEOUT;  Surgeon: Evans Lance, MD;  Location: [place] [facility];  Service: Cardiovascular;  Laterality: N/A;  . BIV ICD GENERTAOR CHANGE OUT  11/15/10   Medtronic Protecta [id number] serial #[id number]  . BiV ICD Placement  06/17/2007; 04/20/14   Medtronic Concerta [id number]; RA lead-Med 5076/53cm, [id number]; RV lead- SJM 7121/65cm, [id number]; LV lead- Med 4194/88cm, [id number]; gen change 04/2014 by Dr Lovena Le  . BIV PACEMAKER GENERATOR CHANGE OUT N/A 04/20/2014   Procedure: BIV PACEMAKER GENERATOR CHANGE OUT;  Surgeon: Evans Lance, MD;  Location: [facility] CATH LAB;  Service: Cardiovascular;  Laterality: N/A;  . BRAIN MENINGIOMA EXCISION  2011   L frontal  meningioma at [facility]   . BREAST SURGERY     lumpectomy right  . CARDIOVERSION  07/21/05   converted to sinus brady  . CAROTID ENDARTERECTOMY Right 09/24/2018   with bovine pericardial patch angioplasty  . CATARACT EXTRACTION W/ INTRAOCULAR LENS  IMPLANT, BILATERAL    . CHOLECYSTECTOMY     Dr. Sherald Hess in [place] and Dr. Lyda Jester follows; surgical stricture post procedure with stent placement  . COLONOSCOPY W/ BIOPSIES AND POLYPECTOMY    . DILATION AND CURETTAGE OF UTERUS    . ENDARTERECTOMY Right 09/24/2018   Procedure: ENDARTERECTOMY CAROTID RIGHT;  Surgeon: Angelia Mould, MD;  Location: [place];  Service: Vascular;  Laterality: Right;  . PV Angio  1994   L Renal artery stent     Current Outpatient Medications  Medication Sig Dispense Refill  . ALPRAZolam (XANAX) 0.5 MG tablet TAKE ONE TABLET BY MOUTH EVERY NIGHT AT BEDTIME FOR INSOMNIA 30 tablet 2  . Apple Cider Vinegar 188 MG CAPS Take 2 capsules by mouth daily.    Marland Kitchen aspirin 81 MG chewable tablet Chew 81 mg by mouth daily.    . calcium carbonate (OSCAL) 1500 (600 Ca) MG TABS tablet Take by mouth.    . digoxin (LANOXIN) 0.125 MG tablet TAKE 1/2 TABLET EVERY DAY (SUBSTITUTED FOR  DIGITEK) 45 tablet 1  . DROPLET PEN NEEDLES 31G X 5 MM MISC USE AS DIRECTED WITH LEVEMIR 300 each 3  . ezetimibe (ZETIA) 10 MG tablet Take 1 tablet (10 mg total) by mouth daily. 90 tablet 1  . fenofibrate micronized (LOFIBRA) 134 MG capsule TAKE 1 CAPSULE EVERY DAY 90 capsule 0  . fluticasone (FLONASE) 50 MCG/ACT nasal spray USE 2 SPRAYS IN EACH NOSTRIL EVERY DAY AS NEEDED 48 g 3  . furosemide (LASIX) 40 MG tablet TAKE 1 TABLET TWICE DAILY 180 tablet 0  . glucose blood (ACCU-CHEK SMARTVIEW) test strip CHECK BLOOD SUGAR TWICE DAILY 200 strip 3  . LEVEMIR FLEXTOUCH 100 UNIT/ML FlexPen INJECT  42 UNITS ONE TIME DAILY 45 mL 0  . levothyroxine (SYNTHROID) 50 MCG tablet TAKE 1 TABLET EVERY DAY 90 tablet 1  . losartan (COZAAR) 100 MG tablet TAKE 1  TABLET EVERY DAY 90 tablet 1  . magnesium oxide (MAG-OX) 400 MG tablet Take 400 mg by mouth 2 (two) times daily.    . meclizine (ANTIVERT) 25 MG tablet Take 1 tablet (25 mg total) by mouth 3 (three) times daily as needed for dizziness. 30 tablet 0  . Multiple Vitamin (MULTIVITAMIN WITH MINERALS) TABS tablet Take 1 tablet by mouth daily. Centrum Silver    . olopatadine (PATADAY) 0.1 % ophthalmic solution Place 1 drop into both eyes daily.    Marland Kitchen omega-3 acid ethyl esters (LOVAZA) 1 g capsule Take 1 capsule (1 g total) by mouth 2 (two) times daily. 180 capsule 1  .  OVER THE COUNTER MEDICATION Take 2 tablets by mouth daily. Takes Beet Root tablets - 2 tablets daily    . oxyCODONE-acetaminophen (PERCOCET) 7.5-325 MG tablet Take 1 tablet by mouth every 6 (six) hours as needed (back pain.). 120 tablet 0  . polyethylene glycol (MIRALAX / GLYCOLAX) 17 g packet Take 17 g by mouth daily as needed.     . pravastatin (PRAVACHOL) 10 MG tablet TAKE 1 TABLET FOUR TIMES WEEKLY 48 tablet 0  . Rivaroxaban (XARELTO) 15 MG TABS tablet Take 1 tablet (15 mg total) by mouth daily with supper. 90 tablet 3  . rOPINIRole (REQUIP) 1 MG tablet TAKE 1 TABLET AT BEDTIME 90 tablet 2  . spironolactone (ALDACTONE) 25 MG tablet Take 12.5 mg by mouth daily.    Marland Kitchen tiZANidine (ZANAFLEX) 4 MG tablet TAKE 1 TABLET (4 MG TOTAL) BY MOUTH AT BEDTIME. 90 tablet 0   Current Facility-Administered Medications  Medication Dose Route Frequency Provider Last Rate Last Admin  . triamcinolone acetonide (KENALOG-40) injection 40 mg  40 mg Intra-articular Once Cox, Kirsten, MD        Allergies:   Gabapentin, Pregabalin, Morphine, Penicillins, Tape, Ace inhibitors, Carvedilol, Insulin glargine, Metoprolol, Pantoprazole, Procaine, Statins, and Lisinopril   Social History:  The patient  reports that she has never smoked. She has never used smokeless tobacco. She reports that she does not drink alcohol and does not use drugs.   Family History:  The  patient's family history includes AAA (abdominal aortic aneurysm) in her brother; Cancer in her father and sister; Dementia in her sister; Diabetes in an other family member; Heart attack in her brother; Heart failure in her mother; Hypertension in her mother; Stroke in her maternal grandmother.   ROS:  Please see the history of present illness.   All other systems are personally reviewed and negative.    Exam:    Vital Signs:  BP (!) 121/57   Pulse 81   Ht 5\' 6"  (1.676 m)   Wt 153 lb 3.2 oz (69.5 kg)   BMI 24.73 kg/m    Labs/Other Tests and Data Reviewed:    Recent Labs: 05/11/2020: Magnesium 2.6 05/25/2020: ALT 15; BUN 54; Creatinine, Ser 1.72; Hemoglobin 11.6; Platelets 324; Potassium 5.4; Sodium 138; TSH 2.440   Wt Readings from Last 3 Encounters:  05/28/20 153 lb 3.2 oz (69.5 kg)  05/25/20 157 lb 6.4 oz (71.4 kg)  05/11/20 156 lb (70.8 kg)     Other studies personally reviewed:  Last device remote is reviewed from [place] PDF dated 04/06/20 which reveals normal device function, no arrhythmias except for chronic atrial fib   ASSESSMENT & PLAN:    1.  Chronic atrial fib - her rates are well controlled. 2. Chronic systolic heart failure - she is improved since she was in the hospital I encouraged her to continue her current meds and maintain a low sodium diet.  3. Biv PPM - her medtronic biv PPM is working normally.  4. COVID 19 screen The patient denies symptoms of COVID 19 at this time.  The importance of social distancing was discussed today.  Follow-up:  1 year Next remote: 10/21  Current medicines are reviewed at length with the patient today.   The patient does not have concerns regarding her medicines.  The following changes were made today:  none  Labs/ tests ordered today include:  No orders of the defined types were placed in this encounter.    Patient Risk:  after full review of  this patients clinical status, I feel that they are at moderate risk at this  time.  Today, I have spent 15 minutes with the patient with telehealth technology discussing all of the above .    Signed, Cristopher Peru, MD  05/28/2020 4:35 PM     [address] [place] [address] [phone] (office) [phone] (fax)

## 2020-06-06 ENCOUNTER — Encounter: Payer: Self-pay | Admitting: Family Medicine

## 2020-06-15 ENCOUNTER — Telehealth: Payer: Self-pay | Admitting: Cardiology

## 2020-06-15 NOTE — Telephone Encounter (Signed)
Patient is calling to see if Dr. Lovena Le can order her B-12 shots. Please call to confirm

## 2020-06-15 NOTE — Telephone Encounter (Signed)
Left detailed message on VM advising Pt should call PCP for B-12 shots.  Dr. Lovena Le will not order these shots.

## 2020-06-21 ENCOUNTER — Other Ambulatory Visit: Payer: Self-pay | Admitting: Family Medicine

## 2020-06-21 DIAGNOSIS — E538 Deficiency of other specified B group vitamins: Secondary | ICD-10-CM

## 2020-06-21 DIAGNOSIS — E559 Vitamin D deficiency, unspecified: Secondary | ICD-10-CM

## 2020-06-21 NOTE — Progress Notes (Signed)
b12

## 2020-06-22 ENCOUNTER — Other Ambulatory Visit: Payer: Self-pay

## 2020-06-22 MED ORDER — OXYCODONE-ACETAMINOPHEN 7.5-325 MG PO TABS
1.0000 | ORAL_TABLET | Freq: Four times a day (QID) | ORAL | 0 refills | Status: DC | PRN
Start: 2020-06-22 — End: 2020-07-21

## 2020-06-24 ENCOUNTER — Other Ambulatory Visit: Payer: Self-pay | Admitting: Family Medicine

## 2020-06-28 ENCOUNTER — Other Ambulatory Visit: Payer: Self-pay | Admitting: Family Medicine

## 2020-06-28 ENCOUNTER — Ambulatory Visit: Payer: Medicare HMO

## 2020-06-28 ENCOUNTER — Encounter: Payer: Self-pay | Admitting: Family Medicine

## 2020-06-28 MED ORDER — SPIRONOLACTONE 25 MG PO TABS
12.5000 mg | ORAL_TABLET | Freq: Every day | ORAL | 0 refills | Status: DC
Start: 2020-06-28 — End: 2020-07-14

## 2020-06-30 ENCOUNTER — Other Ambulatory Visit: Payer: Self-pay | Admitting: Physician Assistant

## 2020-07-06 ENCOUNTER — Ambulatory Visit (INDEPENDENT_AMBULATORY_CARE_PROVIDER_SITE_OTHER): Payer: Medicare HMO | Admitting: Emergency Medicine

## 2020-07-06 DIAGNOSIS — I4811 Longstanding persistent atrial fibrillation: Secondary | ICD-10-CM

## 2020-07-07 ENCOUNTER — Other Ambulatory Visit: Payer: Self-pay | Admitting: Family Medicine

## 2020-07-07 LAB — CUP PACEART REMOTE DEVICE CHECK
Battery Remaining Longevity: 93 mo
Battery Voltage: 2.99 V
Brady Statistic AP VP Percent: 0.09 %
Brady Statistic AP VS Percent: 99.38 %
Brady Statistic AS VP Percent: 0 %
Brady Statistic AS VS Percent: 0.52 %
Brady Statistic RA Percent Paced: 99.44 %
Brady Statistic RV Percent Paced: 0.09 %
Date Time Interrogation Session: 20210928005437
Implantable Lead Implant Date: 20080908
Implantable Lead Implant Date: 20080908
Implantable Lead Implant Date: 20190326
Implantable Lead Location: 753858
Implantable Lead Location: 753860
Implantable Lead Location: 753860
Implantable Lead Model: 3830
Implantable Lead Model: 4194
Implantable Lead Model: 7121
Implantable Pulse Generator Implant Date: 20190326
Lead Channel Impedance Value: 266 Ohm
Lead Channel Impedance Value: 266 Ohm
Lead Channel Impedance Value: 304 Ohm
Lead Channel Impedance Value: 399 Ohm
Lead Channel Impedance Value: 399 Ohm
Lead Channel Impedance Value: 399 Ohm
Lead Channel Impedance Value: 418 Ohm
Lead Channel Impedance Value: 532 Ohm
Lead Channel Impedance Value: 551 Ohm
Lead Channel Sensing Intrinsic Amplitude: 10.375 mV
Lead Channel Sensing Intrinsic Amplitude: 10.375 mV
Lead Channel Sensing Intrinsic Amplitude: 5.875 mV
Lead Channel Sensing Intrinsic Amplitude: 5.875 mV
Lead Channel Setting Pacing Amplitude: 2 V
Lead Channel Setting Pacing Amplitude: 2.5 V
Lead Channel Setting Pacing Pulse Width: 0.4 ms
Lead Channel Setting Sensing Sensitivity: 1.2 mV

## 2020-07-09 NOTE — Progress Notes (Signed)
Remote pacemaker transmission.   

## 2020-07-14 ENCOUNTER — Other Ambulatory Visit: Payer: Self-pay | Admitting: Family Medicine

## 2020-07-21 ENCOUNTER — Telehealth: Payer: Medicare HMO

## 2020-07-21 ENCOUNTER — Other Ambulatory Visit: Payer: Self-pay

## 2020-07-21 ENCOUNTER — Other Ambulatory Visit: Payer: Self-pay | Admitting: Family Medicine

## 2020-07-21 MED ORDER — OXYCODONE-ACETAMINOPHEN 7.5-325 MG PO TABS
1.0000 | ORAL_TABLET | Freq: Four times a day (QID) | ORAL | 0 refills | Status: DC | PRN
Start: 2020-07-21 — End: 2020-08-20

## 2020-07-21 NOTE — Chronic Care Management (AMB) (Deleted)
Chronic Care Management Pharmacy  Name: Jessamyn Watterson  MRN: [id number] DOB: 1934/09/24  Chief Complaint/ HPI  [name],  86 y.o. , female presents for their Follow-Up CCM visit with the clinical pharmacist via telephone due to COVID-19 Pandemic.  PCP : [name], MD  Their chronic conditions include: AV Block, Atrial fibrillation, Chronic systolic heart failure, NSVT, Carotid artery stenosis, Diabetic polyneuropathy, Dyslipidemia, Hypothyroidism, OA of the left knee  Office Visits: 05/25/2020 - PCP - follow-up on hospitalization. No med changes. Refer to pulmonology for symptom management.  12/25/2019 - Recommend voltaren gel apply 4 gm four times a day prn 12/01/2019 - arthrocentesis of left knee completed.  11/18/2019 - Medicare AWV with nursing staff.  Consult Visit: 05/28/2020 - Cardiology virtual visit - no changes. Encouraged low sodium diet.  05/11/2020 - Cardiology - follow-up on heart failure hospitalization.  04/29/2020 - ED to hospital admission - heart failure and arrhythmia.  04/06/2020 - remote pacemaker transmission.  11/24/2019 - cardiologist visit - no med changes.    Medications: Outpatient Encounter Medications as of 07/21/2020  Medication Sig  . Accu-Chek FastClix Lancets MISC TEST BLOOD SUGAR TWICE DAILY  . Alcohol Swabs (B-D SINGLE USE SWABS REGULAR) PADS USE TWICE DAILY  . ALPRAZolam (XANAX) 0.5 MG tablet TAKE ONE TABLET BY MOUTH EVERY NIGHT AT BEDTIME FOR INSOMNIA  . Apple Cider Vinegar 188 MG CAPS Take 2 capsules by mouth daily.  Marland Kitchen aspirin 81 MG chewable tablet Chew 81 mg by mouth daily.  . calcium carbonate (OSCAL) 1500 (600 Ca) MG TABS tablet Take by mouth.  . digoxin (LANOXIN) 0.125 MG tablet TAKE 1/2 TABLET EVERY DAY (SUBSTITUTED FOR  DIGITEK)  . DROPLET PEN NEEDLES 31G X 5 MM MISC USE AS DIRECTED WITH LEVEMIR  . ezetimibe (ZETIA) 10 MG tablet TAKE 1 TABLET EVERY DAY  . fenofibrate micronized (LOFIBRA) 134 MG  capsule TAKE 1 CAPSULE EVERY DAY  . fluticasone (FLONASE) 50 MCG/ACT nasal spray USE 2 SPRAYS IN EACH NOSTRIL EVERY DAY AS NEEDED  . furosemide (LASIX) 40 MG tablet TAKE 1 TABLET TWICE DAILY  . glucose blood (ACCU-CHEK SMARTVIEW) test strip CHECK BLOOD SUGAR TWICE DAILY  . LEVEMIR FLEXTOUCH 100 UNIT/ML FlexPen INJECT  42 UNITS ONE TIME DAILY  . levothyroxine (SYNTHROID) 50 MCG tablet TAKE 1 TABLET EVERY DAY  . losartan (COZAAR) 100 MG tablet TAKE 1 TABLET EVERY DAY  . magnesium oxide (MAG-OX) 400 MG tablet Take 400 mg by mouth 2 (two) times daily.  . meclizine (ANTIVERT) 25 MG tablet Take 1 tablet (25 mg total) by mouth 3 (three) times daily as needed for dizziness.  . Multiple Vitamin (MULTIVITAMIN WITH MINERALS) TABS tablet Take 1 tablet by mouth daily. Centrum Silver  . olopatadine (PATADAY) 0.1 % ophthalmic solution Place 1 drop into both eyes daily.  Marland Kitchen omega-3 acid ethyl esters (LOVAZA) 1 g capsule Take 1 capsule (1 g total) by mouth 2 (two) times daily.  Marland Kitchen OVER THE COUNTER MEDICATION Take 2 tablets by mouth daily. Takes Beet Root tablets - 2 tablets daily  . oxyCODONE-acetaminophen (PERCOCET) 7.5-325 MG tablet Take 1 tablet by mouth every 6 (six) hours as needed (back pain.).  Marland Kitchen polyethylene glycol (MIRALAX / GLYCOLAX) 17 g packet Take 17 g by mouth daily as needed.   . pravastatin (PRAVACHOL) 10 MG tablet TAKE 1 TABLET FOUR TIMES WEEKLY  . Rivaroxaban (XARELTO) 15 MG TABS tablet Take 1 tablet (15 mg total) by mouth daily with supper.  Marland Kitchen rOPINIRole (  REQUIP) 1 MG tablet TAKE 1 TABLET AT BEDTIME  . spironolactone (ALDACTONE) 25 MG tablet TAKE 1/2 TABLET BY MOUTH EVERY DAY  . tiZANidine (ZANAFLEX) 4 MG tablet TAKE 1 TABLET AT BEDTIME   Facility-Administered Encounter Medications as of 07/21/2020  Medication  . triamcinolone acetonide (KENALOG-40) injection 40 mg   Allergies  Allergen Reactions  . Gabapentin Swelling  . Pregabalin Swelling       . Morphine Nausea Only  . Penicillins  Rash and Other (See Comments)    Has patient had a PCN reaction causing immediate rash, facial/tongue/throat swelling, SOB or lightheadedness with hypotension: No Has patient had a PCN reaction causing severe rash involving mucus membranes or skin necrosis: No Has patient had a PCN reaction that required hospitalization: No - MD office Has patient had a PCN reaction occurring within the last 10 years: No If all of the above answers are "NO", then may proceed with Cephalosporin use.   . Tape Rash  . Ace Inhibitors Other (See Comments)    Angioedema (ALLERGY/intolerance)  . Carvedilol Other (See Comments)    Myalgias (intolerance)  . Insulin Glargine Itching  . Metoprolol Other (See Comments)    Angioedema (ALLERGY/intolerance)  . Pantoprazole Nausea Only  . Procaine Other (See Comments)    unknown  . Statins Other (See Comments)    Myalgias (intolerance)  . Lisinopril Rash          SDOH Screenings   Alcohol Screen:   . Last Alcohol Screening Score (AUDIT): Not on file  Depression (PHQ2-9): Low Risk   . PHQ-2 Score: 0  Financial Resource Strain:   . Difficulty of Paying Living Expenses: Not on file  Food Insecurity: No Food Insecurity  . Worried About Charity fundraiser in the Last Year: Never true  . Ran Out of Food in the Last Year: Never true  Housing: Low Risk   . Last Housing Risk Score: 0  Physical Activity:   . Days of Exercise per Week: Not on file  . Minutes of Exercise per Session: Not on file  Social Connections:   . Frequency of Communication with Friends and Family: Not on file  . Frequency of Social Gatherings with Friends and Family: Not on file  . Attends Religious Services: Not on file  . Active Member of Clubs or Organizations: Not on file  . Attends Archivist Meetings: Not on file  . Marital Status: Not on file  Stress:   . Feeling of Stress : Not on file  Tobacco Use: Low Risk   . Smoking Tobacco Use: Never Smoker  . Smokeless Tobacco  Use: Never Used  Transportation Needs: No Transportation Needs  . Lack of Transportation (Medical): No  . Lack of Transportation (Non-Medical): No    Current Diagnosis/Assessment:  Goals Addressed   None     Heart Failure   Type: Systolic  Last ejection fraction: 30-35%  Patient has failed these meds in past: beta blockers Patient is currently {CHL Controlled/Uncontrolled:[phone]} on the following medications:   Losartan 100 mg daily  Aldactone 12.5 mg daily   We discussed {CHL [facility] Pharm[facility] discussion:[phone]}  Plan  Continue {CHL [facility] Pharmacy Plans:[phone]}   AFIB   Patient is currently rhythm controlled.   Patient has failed these meds in past: carvedilol, metoprolol Patient is currently controlled on the following medications: Xarelto 15 mg, Digoxin   We discussed:  diet and exercise extensively. Patient tries to cook and eat healthy. Patient has a  pacemaker and afib. She is not a candidate for back surgery due to heart helath. Patient reports that pacemaker is controlling symptoms.   Update 04/22/2020  - Patient's bp is in the 70s and 80s at home. At time she feels fatigued and short of breath. She is following up with Dr. Lovena Le in September.   Plan  Continue current medications   Hyperlipidemia   Lipid Panel     Component Value Date/Time   CHOL 142 05/25/2020 1215   TRIG 80 05/25/2020 1215   HDL 48 05/25/2020 1215   CHOLHDL 3.0 05/25/2020 1215   LDLCALC 78 05/25/2020 1215   LABVLDL 16 05/25/2020 1215     The ASCVD Risk score (Goff DC Jr., et al., 2013) failed to calculate for the following reasons:   The 2013 ASCVD risk score is only valid for ages 56 to 1   Patient has failed these meds in past: pitavastatin 2 mg  Patient is currently controlled on the following medications: zetia 5 mg daily, fenofibrate 134 mg daily, pravastatin 20 mg 4 times per week, Coenzyme q10 200 mg daily, omega 3 1000 mg daily   We discussed:   diet and exercise extensively. Patient is caring for her husband and son so does not have time for exercise.  Plan  Continue current medications   ,  Diabetes   Recent Relevant Labs: Lab Results  Component Value Date/Time   HGBA1C 6.2 (H) 05/25/2020 12:15 PM   HGBA1C 6.1 (H) 12/25/2019 10:58 AM   MICROALBUR 10 12/25/2019 12:45 PM     Checking BG: 2x per Day  Recent FBG Readings: 80-120 mg/dL Recent HS BG readings: 140-150 mg/dL Patient has failed these meds in past:  bedtime, glipizide 5 mg bid, metformin 1000 mg bid Patient is currently controlled on the following medications: accu-check smartview test strips, Levemir flex pen 5 units  Last diabetic Foot exam: 12/2019 Last diabetic Eye exam: 05/2019  We discussed: diet and exercise extensively. Has peppermint discs and orange juice. Has lost some weight. Started taking apple cider vinegar capsules and thinks it could be helping her with weight loss. Tries to cook healthy and eats meats and vegetables. Has been busy taking care of son and husband.   Since patient's weight loss her blood sugar is much lower. She was previously on 42 units of Levemir. Dr. Tobie Poet reduced to 5 units at bedtime. Her blood sugar was 59 mg/dL one night with the 5 units. She stopped taking Levemir in the past few weeks. Her blood sugars since then were 80-120 mg/dL.  Patient is not working to lose weight but attributes it to apple cider vinegar she began taking recently. 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   Update 04/22/2020 - Patient denies any low blood sugars.  103, 123, 117, 105, 104, 115, 105, 122, 111, 158 (after eating), 120, 95, 96, 120. She has resumed Levemir 5 units at bedtime.   Plan  Continue current medications and control with diet and exercise,  Heart Failure   Type: Systolic  Last ejection fraction: 60-65% NYHA Class: II (slight limitation of activity)  Patient has failed these meds in  past: carvediolol, metoprolol, lisinopril Patient is currently controlled on the following medications: furosemide 40 mg bid, losartan 100 mg daily We discussed diet and exercise extensively. She tries to watch sodium when cooking. She indicates slight foot swelling. She will elevate feet in recliner if she ever gets a change to sit during  the day.   Update 04/22/2020 - Patient reports feeling winded and fatiguing easier. She references her fatigue similar to history of afib. Weighed 169 at the orthopedic doctor this am. Weight is stable denies significant leg swelling. Patient is scheduled for a cardiologist follow-up in September.   Plan  Continue current medications ,  Hypertension   BP today is:  <140/90  Office blood pressures are  BP Readings from Last 3 Encounters:  05/28/20 (!) 121/57  05/25/20 124/72  05/11/20 126/60   Kidney Function Lab Results  Component Value Date/Time   CREATININE 1.72 (H) 05/25/2020 12:15 PM   CREATININE 1.95 (H) 05/11/2020 12:08 PM   GFR 38.32 (L) 11/30/2014 12:13 PM   GFRNONAA 27 (L) 05/25/2020 12:15 PM   GFRAA 31 (L) 05/25/2020 12:15 PM   K 5.4 (H) 05/25/2020 12:15 PM   K 4.7 05/11/2020 12:08 PM    Patient has failed these meds in the past: lisinopril  Patient is currently uncontrolled on the following medications: amlodipine 5 mg daily, losartan 100 mg daily Patient checks BP at home infrequently  Patient home BP readings are ranging: 145/70  We discussed diet and exercise extensively Patient was on lisinopril previously but had an allergic reaction. Do not take any more ACEI. Not salting food. She eats some potato chips. Doesn't like real salty foods.   Update 04/22/2020 - Recent bp readings 118/60, 118/60, 126/57, 124/71, 143/62, 128/61, 135/58, 133/98, 138/62. Pulse: 70, 86, 86. Patient feels like she is getting out of breath some. She has been more fatigued lately. She notes some leg swelling on her bad knee after being up on it all day  otherwise no swelling and weight is stable. We discussed the benefits of exercise. Discussed proper monitoring technique of sitting for a few minutes before taking blood pressure, avoiding full bladder and both feet flat on the floor.   Plan  Continue current medications   Hypothyroidism   TSH  Date Value Ref Range Status  05/25/2020 2.440 0.450 - 4.500 uIU/mL Final     Patient has failed these meds in past: n/a Patient is currently controlled on the following medications: levothyroxine 50 mcg daily  We discussed:  diet and exercise extensively  Plan  Continue current medications   Pain   Patient has failed these meds in past: n/a Patient is currently controlled on the following medications: Oxycodone 7.5/325 mg q6h prn, tizanidine 4 mg qhs  We discussed:  Patient's back pain is managed by oxycodone/apap and tizanidine. 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 She is not a candidate for surgery due to heart.  Update 04/22/2020 - Had a visit with orthopedic today. They did a shot for her knee and gave her a heel pad for her shoes to help. She has been encouraged to exercise at home to help her knee. She has a machine at home but it doesn't have a permanent spot in her home and hurts her back to pull it out.   Plan  Continue current medications   Health Maintenance   Patient is currently controlled on the following medications:  OsCal 1500 mg with Vitamin D - bone health Alprazolam 0.5 mg hs - sleep Magnesium 400 mg bid -leg cramps helps with bowels Apple Cider Vinegar capsule - general health Beet Root Tablet - general health Fluticasone 50 mcg/actu - allergies Multivitamin with Minerals - general health Miralax prn - constipation Triamcinolone cream mixed with Nystatin- skin irritation on  forehead   We discussed: Patient has stopped ropinirole 1 mg daily due to potential for causing potential side effects. She indicates  that her symptoms have been ok with this discontinuation.   Update 04/22/2020 - Patient is wanting something to help her with energy. She is concerned she doesn't have as much lately. She has taken Vitamin b-12 shots in the past and would like to consider resuming. She plans to ask Dr. Tobie Poet at her visit in 2 weeks.   Plan  Continue current medications  Vaccines   Reviewed and discussed patient's vaccination history. COVID vaccine - has had both shots.   Immunization History  Administered Date(s) Administered  . Influenza-Unspecified 08/09/2018, 06/23/2019  . Pneumococcal Conjugate-13 10/05/2014  . Pneumococcal Polysaccharide-23 06/11/2013  . Tdap 05/28/2017    Plan  Recommended patient receive annual flu vaccine in office.  Medication Management   Pt uses [facility] for all medications Uses pill box? Yes Pt endorses good compliance  We discussed: Fills up medicine boxes every Wednesday.   Plan  Continue current medication management strategy    Follow up: 3 month phone visit

## 2020-07-28 ENCOUNTER — Other Ambulatory Visit: Payer: Self-pay | Admitting: Family Medicine

## 2020-07-28 NOTE — Telephone Encounter (Signed)
Please call pt nd see if she is taking pantoprazole 40 mg one twice a day. I have she has an adverse reaction of nausea, but I received a request for this refill.

## 2020-07-29 ENCOUNTER — Other Ambulatory Visit: Payer: Self-pay

## 2020-07-29 ENCOUNTER — Ambulatory Visit: Payer: Medicare HMO

## 2020-07-29 NOTE — Chronic Care Management (AMB) (Signed)
Chronic Care Management Pharmacy  Name: [name]  MRN: [id number] DOB: 1934/01/10  Chief Complaint/ HPI  [name],  86 y.o. , female presents for their Follow-Up CCM visit with the clinical pharmacist via telephone due to COVID-19 Pandemic.  PCP : [name], MD  Their chronic conditions include: AV Block, Atrial fibrillation, Chronic systolic heart failure, NSVT, Carotid artery stenosis, Diabetic polyneuropathy, Dyslipidemia, Hypothyroidism, OA of the left knee  Office Visits: 05/25/2020 - PCP - follow-up on hospitalization. No med changes. Refer to pulmonology for symptom management.   Consult Visit: 05/28/2020 - Cardiology virtual visit - no changes. Encouraged low sodium diet.  05/11/2020 - Cardiology - follow-up on heart failure hospitalization.  04/29/2020 - ED to hospital admission - heart failure and arrhythmia.     Medications: Outpatient Encounter Medications as of 07/29/2020  Medication Sig  . Accu-Chek FastClix Lancets MISC TEST BLOOD SUGAR TWICE DAILY  . Alcohol Swabs (B-D SINGLE USE SWABS REGULAR) PADS USE TWICE DAILY  . ALPRAZolam (XANAX) 0.5 MG tablet TAKE ONE TABLET BY MOUTH EVERY NIGHT AT BEDTIME FOR INSOMNIA  . Apple Cider Vinegar 188 MG CAPS Take 2 capsules by mouth daily.  Marland Kitchen aspirin 81 MG chewable tablet Chew 81 mg by mouth daily.  . calcium carbonate (OSCAL) 1500 (600 Ca) MG TABS tablet Take by mouth.  . digoxin (LANOXIN) 0.125 MG tablet TAKE 1/2 TABLET EVERY DAY (SUBSTITUTED FOR  DIGITEK)  . DROPLET PEN NEEDLES 31G X 5 MM MISC USE AS DIRECTED WITH LEVEMIR  . ezetimibe (ZETIA) 10 MG tablet TAKE 1 TABLET EVERY DAY  . fenofibrate micronized (LOFIBRA) 134 MG capsule TAKE 1 CAPSULE EVERY DAY  . fluticasone (FLONASE) 50 MCG/ACT nasal spray USE 2 SPRAYS IN EACH NOSTRIL EVERY DAY AS NEEDED  . furosemide (LASIX) 40 MG tablet TAKE 1 TABLET TWICE DAILY  . glucose blood (ACCU-CHEK SMARTVIEW) test strip CHECK BLOOD SUGAR TWICE  DAILY  . LEVEMIR FLEXTOUCH 100 UNIT/ML FlexPen INJECT 42 UNITS SUBCUTANEOUSLY ONE TIME DAILY (Patient taking differently: 5 Units. )  . levothyroxine (SYNTHROID) 50 MCG tablet TAKE 1 TABLET EVERY DAY  . losartan (COZAAR) 100 MG tablet TAKE 1 TABLET EVERY DAY  . magnesium oxide (MAG-OX) 400 MG tablet Take 400 mg by mouth 2 (two) times daily.  . meclizine (ANTIVERT) 25 MG tablet Take 1 tablet (25 mg total) by mouth 3 (three) times daily as needed for dizziness.  . Multiple Vitamin (MULTIVITAMIN WITH MINERALS) TABS tablet Take 1 tablet by mouth daily. Centrum Silver  . olopatadine (PATADAY) 0.1 % ophthalmic solution Place 1 drop into both eyes daily.  Marland Kitchen omega-3 acid ethyl esters (LOVAZA) 1 g capsule Take 1 capsule (1 g total) by mouth 2 (two) times daily.  Marland Kitchen oxyCODONE-acetaminophen (PERCOCET) 7.5-325 MG tablet Take 1 tablet by mouth every 6 (six) hours as needed (back pain.).  Marland Kitchen polyethylene glycol (MIRALAX / GLYCOLAX) 17 g packet Take 17 g by mouth daily as needed.   . pravastatin (PRAVACHOL) 10 MG tablet TAKE 1 TABLET FOUR TIMES WEEKLY  . Rivaroxaban (XARELTO) 15 MG TABS tablet Take 1 tablet (15 mg total) by mouth daily with supper.  Marland Kitchen rOPINIRole (REQUIP) 1 MG tablet TAKE 1 TABLET AT BEDTIME  . spironolactone (ALDACTONE) 25 MG tablet TAKE 1/2 TABLET BY MOUTH EVERY DAY  . tiZANidine (ZANAFLEX) 4 MG tablet TAKE 1 TABLET AT BEDTIME  . allopurinol (ZYLOPRIM) 100 MG tablet TAKE 1 TABLET EVERY DAY (Patient not taking: Reported on 07/29/2020)  . OVER  THE COUNTER MEDICATION Take 2 tablets by mouth daily. Takes Beet Root tablets - 2 tablets daily (Patient not taking: Reported on 07/29/2020)   Facility-Administered Encounter Medications as of 07/29/2020  Medication  . triamcinolone acetonide (KENALOG-40) injection 40 mg   Allergies  Allergen Reactions  . Gabapentin Swelling  . Pregabalin Swelling       . Morphine Nausea Only  . Penicillins Rash and Other (See Comments)    Has patient had a PCN  reaction causing immediate rash, facial/tongue/throat swelling, SOB or lightheadedness with hypotension: No Has patient had a PCN reaction causing severe rash involving mucus membranes or skin necrosis: No Has patient had a PCN reaction that required hospitalization: No - MD office Has patient had a PCN reaction occurring within the last 10 years: No If all of the above answers are "NO", then may proceed with Cephalosporin use.   . Tape Rash  . Ace Inhibitors Other (See Comments)    Angioedema (ALLERGY/intolerance)  . Carvedilol Other (See Comments)    Myalgias (intolerance)  . Insulin Glargine Itching  . Metoprolol Other (See Comments)    Angioedema (ALLERGY/intolerance)  . Pantoprazole Nausea Only  . Procaine Other (See Comments)    unknown  . Statins Other (See Comments)    Myalgias (intolerance)  . Lisinopril Rash          SDOH Screenings   Alcohol Screen:   . Last Alcohol Screening Score (AUDIT): Not on file  Depression (PHQ2-9): Low Risk   . PHQ-2 Score: 0  Financial Resource Strain:   . Difficulty of Paying Living Expenses: Not on file  Food Insecurity: No Food Insecurity  . Worried About Charity fundraiser in the Last Year: Never true  . Ran Out of Food in the Last Year: Never true  Housing: Low Risk   . Last Housing Risk Score: 0  Physical Activity:   . Days of Exercise per Week: Not on file  . Minutes of Exercise per Session: Not on file  Social Connections:   . Frequency of Communication with Friends and Family: Not on file  . Frequency of Social Gatherings with Friends and Family: Not on file  . Attends Religious Services: Not on file  . Active Member of Clubs or Organizations: Not on file  . Attends Archivist Meetings: Not on file  . Marital Status: Not on file  Stress:   . Feeling of Stress : Not on file  Tobacco Use: Low Risk   . Smoking Tobacco Use: Never Smoker  . Smokeless Tobacco Use: Never Used  Transportation Needs: No  Transportation Needs  . Lack of Transportation (Medical): No  . Lack of Transportation (Non-Medical): No    Current Diagnosis/Assessment:  Goals Addressed            This Visit's Progress   . Pharmacy Care Plan       CARE PLAN ENTRY  Current Barriers:  . Chronic Disease Management support, education, and care coordination needs related to Hypertension, Hyperlipidemia, and Diabetes   Hypertension . Pharmacist Clinical Goal(s): o Over the next 90 days, patient will work with PharmD and providers to maintain BP goal <140/90 . Current regimen:  o Losartan 100 mg daily . Interventions: o Recommend patient continue checking blood pressure daily and recording. o Discussed patient upcoming follow-up with Cardiologist.  o Recommended patient begin regular exercise for overall health and knee mobility.  . Patient self care activities - Over the next 90 days, patient will: o  Check BP weekly, document, and provide at future appointments o Ensure daily salt intake < 2300 mg/day  Hyperlipidemia . Pharmacist Clinical Goal(s): o Over the next 90 days, patient will work with PharmD and providers to maintain LDL goal < 70 mg/dL o Ensure safety, efficacy, and affordability of medications . Current regimen:  o Pravastatin 10 mg - 4 times weekly o Omega 3 1000 mg daily o Zetia 10 mg daily o Fenofibrate 134 mg daily . Interventions: o Recommend patient continue with healthy diet each day until next visit with Dr. Tobie Poet.  o Recommended  patient schedule  . Patient self care activities - Over the next 90 days, patient will: o Call provider with any concerns or issues, continue taking medication as prescribed each day and request prescription refills from [facility] as needed.   Diabetes . Pharmacist Clinical Goal(s): o Over the next 90 days, patient will work with PharmD and providers to maintain A1c goal <7% . Current regimen:  o Levemir 5 units  daily . Interventions: o Recommend patient continue to avoid using Levemir since low blood sugar. Patient to continue monitoring blood sugar each day and contact pharmacist if fasting blood sugar gets <70 mg/dL or >130 mg/dL.  o Discussed blood sugar readings with patient. Patient resumed Levemir on her own.  . Patient self care activities - Over the next 90 days, patient will: o Check blood sugar once daily, document, and provide at future appointments o Contact provider with any episodes of hypoglycemia  Medication management . Pharmacist Clinical Goal(s): o Over the next 90 days, patient will work with PharmD and providers to maintain optimal medication adherence . Current pharmacy: [facility] . Interventions o Comprehensive medication review performed. o Continue current medication management strategy . Patient self care activities - Over the next 90 days, patient will: o Focus on medication adherence by continuing to use pill box. o Take medications as prescribed o Report any questions or concerns to PharmD and/or provider(s)  Please see past updates related to this goal by clicking on the "Past Updates" button in the selected goal         Heart Failure   Type: Systolic  Last ejection fraction: 30-35%  Patient has failed these meds in past: carvediolol, metoprolol, lisinopril Patient is currently controlled on the following medications:   furosemide 40 mg bid  losartan 100 mg daily  Aldactone 12.5 mg daily  We discussed diet and exercise extensively. Patient having some swelling in her legs/feet. She is elevating them when she can. She is watching sodium in her diet. She is weighing stable 155 lbs. She has been advised to schedule cardiac rehab after hospitalization. She hasn't been able to schedule at this time but plans to soon. Caring for her husband and son limits her ability to schedule her own appointments.   Plan  Continue current medications   AFIB    Patient is currently rhythm controlled.  Patient has failed these meds in past: carvedilol, metoprolol Patient is currently controlled on the following medications:   Xarelto 15 mg daily  Digoxin 0.125 mg - 1/2 tablet daily  We discussed:  diet and exercise extensively. Patient reports good compliance with therapy. Denies cost concerns.   Plan  Continue current medications   Hyperlipidemia   Lipid Panel     Component Value Date/Time   CHOL 142 05/25/2020 1215   TRIG 80 05/25/2020 1215   HDL 48 05/25/2020 1215   CHOLHDL 3.0 05/25/2020 1215  [place] 78 05/25/2020 1215   LABVLDL 16 05/25/2020 1215     The ASCVD Risk score Mikey Bussing DC Jr., et al., 2013) failed to calculate for the following reasons:   The 2013 ASCVD risk score is only valid for ages 70 to 66   Patient has failed these meds in past: pitavastatin 2 mg  Patient is currently controlled on the following medications:   zetia 10 mg daily  fenofibrate 134 mg daily  pravastatin 10 mg 4 times per week  omega 3 1000 mg daily   We discussed:  diet and exercise extensively. Patient reports good compliance with therapy. Tolerating Pravastatin four times weekly.   Plan  Continue current medications   ,  Diabetes   Recent Relevant Labs: Lab Results  Component Value Date/Time   HGBA1C 6.2 (H) 05/25/2020 12:15 PM   HGBA1C 6.1 (H) 12/25/2019 10:58 AM   MICROALBUR 10 12/25/2019 12:45 PM     Checking BG: 2x per Day  Recent FBG Readings: 91 mg/dL  Patient has failed these meds in past:  bedtime, glipizide 5 mg bid, metformin 1000 mg bid Patient is currently controlled on the following medications:   accu-check smartview test strips  Levemir flex pen 5 units  Last diabetic Foot exam: 12/2019 Last diabetic Eye exam: 05/2019  We discussed: diet and exercise extensively. Patient reports good blood sugar control currently. Using 5 units of Levemir.   Plan  Continue current medications and control with  diet and exercise,   Hypertension   BP today is:  <140/90  Office blood pressures are  BP Readings from Last 3 Encounters:  05/28/20 (!) 121/57  05/25/20 124/72  05/11/20 126/60   Kidney Function Lab Results  Component Value Date/Time   CREATININE 1.72 (H) 05/25/2020 12:15 PM   CREATININE 1.95 (H) 05/11/2020 12:08 PM   GFR 38.32 (L) 11/30/2014 12:13 PM   GFRNONAA 27 (L) 05/25/2020 12:15 PM   GFRAA 31 (L) 05/25/2020 12:15 PM   K 5.4 (H) 05/25/2020 12:15 PM   K 4.7 05/11/2020 12:08 PM    Patient has failed these meds in the past: lisinopril, amlodipine  Patient is currently uncontrolled on the following medications:  losartan 100 mg daily Patient checks BP at home infrequently  Patient home BP readings are ranging: 129/44 mmHg 07/29/2020  We discussed diet and exercise extensively. Discussed low diastolic reading today. Encouraged patient to contact provider if remains that low. Patient states she follows-up with cardiology next week.  Patient plans to recheck blood pressure today and will contact pharmacist if remains low.   Plan  Continue current medications   Hypothyroidism   TSH  Date Value Ref Range Status  05/25/2020 2.440 0.450 - 4.500 uIU/mL Final     Patient has failed these meds in past: n/a Patient is currently controlled on the following medications:   levothyroxine 50 mcg daily  We discussed:  Patient takes medication first thing as prescribed.   Plan  Continue current medications   Pain   Patient has failed these meds in past: n/a Patient is currently controlled on the following medications:   Oxycodone 7.5/325 mg q6h prn  tizanidine 4 mg qhs  We discussed:  Patient reports continuing to take both for knee and back pain. Medication relieves pain.   Plan  Continue current medications   Health Maintenance   Patient is currently controlled on the following medications:   OsCal 1500 mg with Vitamin D - bone health  Alprazolam 0.5  mg hs -  sleep  Magnesium 400 mg bid -leg cramps helps with bowels  Apple Cider Vinegar capsule - general health  Beet Root Tablet - general health  Fluticasone 50 mcg/actu - allergies  Multivitamin with Minerals - general health  Miralax prn - constipation  Triamcinolone cream mixed with Nystatin- skin irritation on forehead  We discussed: Patient reports out of beet root tablet but will purchase and resume. Patient is caring for her husband who is not doing well and her son.   Plan  Continue current medications  Vaccines   Reviewed and discussed patient's vaccination history. COVID vaccine - has had both shots.   Immunization History  Administered Date(s) Administered  . Influenza-Unspecified 08/09/2018, 06/23/2019  . Pneumococcal Conjugate-13 10/05/2014  . Pneumococcal Polysaccharide-23 06/11/2013  . Tdap 05/28/2017    Plan  Recommended patient receive annual flu vaccine and third COVID vaccine in office.  Medication Management   Pt uses [facility] for all medications Uses pill box? Yes Pt endorses good compliance  We discussed: Fills up medicine boxes every Wednesday.   Plan  Continue current medication management strategy    Follow up: 3 month phone visit

## 2020-07-29 NOTE — Telephone Encounter (Signed)
Left a voicemail for patient to give office a call back for a refill request we received.

## 2020-07-31 ENCOUNTER — Other Ambulatory Visit: Payer: Self-pay | Admitting: Family Medicine

## 2020-07-31 ENCOUNTER — Other Ambulatory Visit: Payer: Self-pay | Admitting: Physician Assistant

## 2020-08-04 ENCOUNTER — Other Ambulatory Visit: Payer: Self-pay

## 2020-08-04 DIAGNOSIS — Z961 Presence of intraocular lens: Secondary | ICD-10-CM | POA: Diagnosis not present

## 2020-08-04 DIAGNOSIS — E119 Type 2 diabetes mellitus without complications: Secondary | ICD-10-CM | POA: Diagnosis not present

## 2020-08-04 DIAGNOSIS — H43393 Other vitreous opacities, bilateral: Secondary | ICD-10-CM | POA: Diagnosis not present

## 2020-08-04 DIAGNOSIS — H524 Presbyopia: Secondary | ICD-10-CM | POA: Diagnosis not present

## 2020-08-04 MED ORDER — ALPRAZOLAM 0.5 MG PO TABS
ORAL_TABLET | ORAL | 0 refills | Status: DC
Start: 2020-08-04 — End: 2020-09-22

## 2020-08-04 MED ORDER — SPIRONOLACTONE 25 MG PO TABS
12.5000 mg | ORAL_TABLET | Freq: Every day | ORAL | 1 refills | Status: DC
Start: 2020-08-04 — End: 2020-11-29

## 2020-08-04 MED ORDER — BD SWAB SINGLE USE REGULAR PADS: MEDICATED_PAD | 3 refills | Status: DC

## 2020-08-04 MED ORDER — MECLIZINE HCL 25 MG PO TABS: 25.0000 mg | ORAL_TABLET | Freq: Three times a day (TID) | ORAL | 0 refills | Status: DC | PRN

## 2020-08-04 MED ORDER — FENOFIBRATE MICRONIZED 134 MG PO CAPS
134.0000 mg | ORAL_CAPSULE | Freq: Every day | ORAL | 1 refills | Status: DC
Start: 2020-08-04 — End: 2020-10-19

## 2020-08-04 MED ORDER — ACCU-CHEK FASTCLIX LANCETS MISC: 3 refills | Status: DC

## 2020-08-06 ENCOUNTER — Other Ambulatory Visit: Payer: Self-pay

## 2020-08-12 ENCOUNTER — Other Ambulatory Visit: Payer: Self-pay

## 2020-08-12 ENCOUNTER — Encounter: Payer: Self-pay | Admitting: Cardiology

## 2020-08-12 ENCOUNTER — Ambulatory Visit: Payer: Medicare HMO | Admitting: Cardiology

## 2020-08-12 VITALS — BP 128/58 | HR 80 | Ht 68.0 in | Wt 182.8 lb

## 2020-08-12 DIAGNOSIS — Z95810 Presence of automatic (implantable) cardiac defibrillator: Secondary | ICD-10-CM

## 2020-08-12 DIAGNOSIS — E8881 Metabolic syndrome: Secondary | ICD-10-CM | POA: Diagnosis not present

## 2020-08-12 DIAGNOSIS — I5022 Chronic systolic (congestive) heart failure: Secondary | ICD-10-CM

## 2020-08-12 DIAGNOSIS — I4821 Permanent atrial fibrillation: Secondary | ICD-10-CM

## 2020-08-12 DIAGNOSIS — E782 Mixed hyperlipidemia: Secondary | ICD-10-CM

## 2020-08-12 DIAGNOSIS — R5383 Other fatigue: Secondary | ICD-10-CM | POA: Diagnosis not present

## 2020-08-12 DIAGNOSIS — E0842 Diabetes mellitus due to underlying condition with diabetic polyneuropathy: Secondary | ICD-10-CM

## 2020-08-12 MED ORDER — METOLAZONE 5 MG PO TABS: ORAL_TABLET | ORAL | 0 refills | Status: DC

## 2020-08-12 NOTE — Progress Notes (Signed)
Cardiology Office Note:    Date:  08/12/2020   ID:  [name] [name], DOB 30-Aug-1934, MRN [id number]  PCP:  Sandy Brome, MD  Cardiologist:  No primary care provider on file.  Electrophysiologist:  None   Referring MD: Sandy Brome, MD   I have been having some leg swelling recently.  History of Present Illness:    [name] is a 86 y.o. female with a hx of atrial fibrillation status post ablation, status post tri-BiV ICD placed in 2015 due to nonischemic cardiomyopathy, chronic systolic heart failure most recent EF at [facility] in July 2021 showed 30 to 35%, hypertension, hyperlipidemia, history of CVA, peripheral vascular disease, renal artery stenosis, status post polymorphic V. tach which was related to sotalol therapy and amiodarone pulmonary toxicity.  I saw the patient June 07, 2020 at that time she was status post hospitalization for heart failure event of hospital.  During her visit we reviewed her medication she was on guideline directed medical therapy I kept the patient on all of her medicines.  In the interim she did see Sandy. Crissie Hunt who is her electrophysiologist.  No changes were made. Today she tells me that she has gained some weight and has had significant bilateral leg edema.  She really has been doing a lot at home as her husband is severely ill almost bed reading and so is her son.  She has fatigue as well.  But she tells me she still has hope that she is happy that she has family members in her church friends who are a very helpful to her.  Past Medical History:  Diagnosis Date  . Amiodarone pulmonary toxicity   . Anemia    no GI bleeding; on iron  . Arthritis   . Atrial fibrillation ([place])   . Biventricular ICD (implantable cardiac defibrillator) in place    BiV ICD for nonischemic cardiomyopathy; BiV responder  . Carotid artery occlusion   . CHF (congestive heart failure) ([place])   . Chronic a-fib ([place])    on xarelto; failed DCCV    . Chronic kidney disease, stage 4 (severe) ([place])   . Diabetes ([place])   . Fibromyalgia   . Full dentures   . GAD (generalized anxiety disorder)   . GERD (gastroesophageal reflux disease)   . H/O cardiovascular stress test 08/04/2010   normal imaging; EF 61%  . H/O echocardiogram 07/05/2010   EF 45-50%; aortic valve-mildly sclerotic; LA-mod dilaterd   . Hyperlipemia   . Hypertension   . Hypertensive heart disease with heart failure ([place])   . Hypothalamic disease ([place])    on thyroid supplement  . Hypothyroidism   . Hypothyroidism   . LBBB (left bundle branch block)   . Mixed hyperlipidemia   . Occlusion and stenosis of right carotid artery   . Other persistent atrial fibrillation ([place])   . Pneumonia   . Presence of automatic (implantable) cardiac defibrillator   . PVD (peripheral vascular disease) ([place])    L RA stent 1994  . RAS (renal artery stenosis) ([place])    a. renal dopp (12/18/08)- Right RA-<60%; L RA stent- open; nl size and shape in both kidneys;  b.  Rena Artery [name] (3/16):  SMA with > 70% stenosis, Bilateral prox RA with 1-59%  . Renal insufficiency, mild    05/01/12 Creat 1.47  . Restless leg syndrome   . Type 2 diabetes mellitus with diabetic neuropathy ([place])   . VT (ventricular tachycardia) ([place]) 9/11  polymorphic VT probably related to sotalol therapy  . Wears glasses     Past Surgical History:  Procedure Laterality Date  . ABDOMINAL HYSTERECTOMY    . APPENDECTOMY    . AV NODE ABLATION  06/08/2006   performed by Sandy. Beckie Hunt; due to Afib with RVR and tachy brady syndrome  . BIV ICD GENERATOR CHANGEOUT N/A 12/31/2017   Procedure: BIV ICD GENERATOR CHANGEOUT;  Surgeon: Sandy Lance, MD;  Location: [facility];  Service: Cardiovascular;  Laterality: N/A;  . BIV ICD GENERTAOR CHANGE OUT  11/15/10   Medtronic Protecta [id number] serial #[id number]  . BiV ICD Placement  06/17/2007; 04/20/14   Medtronic Concerta [id number]; RA lead-Med 5076/53cm, [id number]; RV  lead- SJM 7121/65cm, [id number]; LV lead- Med 4194/88cm, [id number]; gen change 04/2014 by [name]  . BIV PACEMAKER GENERATOR CHANGE OUT N/A 04/20/2014   Procedure: BIV PACEMAKER GENERATOR CHANGE OUT;  Surgeon: Sandy Lance, MD;  Location: [facility];  Service: Cardiovascular;  Laterality: N/A;  . BRAIN MENINGIOMA EXCISION  2011   L frontal meningioma at [facility]   . BREAST SURGERY     lumpectomy right  . CARDIOVERSION  07/21/05   converted to sinus brady  . CAROTID ENDARTERECTOMY Right 09/24/2018   with bovine pericardial patch angioplasty  . CATARACT EXTRACTION W/ INTRAOCULAR LENS  IMPLANT, BILATERAL    . CHOLECYSTECTOMY     Sandy. Sherald Hunt in [place] and Sandy. Lyda Hunt follows; surgical stricture post procedure with stent placement  . COLONOSCOPY W/ BIOPSIES AND POLYPECTOMY    . DILATION AND CURETTAGE OF UTERUS    . ENDARTERECTOMY Right 09/24/2018   Procedure: ENDARTERECTOMY CAROTID RIGHT;  Surgeon: Angelia Mould, MD;  Location: [facility];  Service: Vascular;  Laterality: Right;  . PV Angio  1994   L Renal artery stent     Current Medications: Current Meds  Medication Sig  . Accu-Chek FastClix Lancets MISC TEST BLOOD SUGAR TWICE DAILY E11.69  . Alcohol Swabs (B-D SINGLE USE SWABS REGULAR) PADS USE TWICE DAILY  E11.69  . allopurinol (ZYLOPRIM) 100 MG tablet TAKE 1 TABLET EVERY DAY  . ALPRAZolam (XANAX) 0.5 MG tablet TAKE ONE TABLET BY MOUTH EVERY NIGHT AT BEDTIME FOR INSOMNIA  . amLODipine (NORVASC) 5 MG tablet Take 5 mg by mouth.  . Apple Cider Vinegar 188 MG CAPS Take 2 capsules by mouth daily.  Marland Kitchen aspirin 81 MG chewable tablet Chew 81 mg by mouth daily.  . calcium carbonate (OSCAL) 1500 (600 Ca) MG TABS tablet Take by mouth.  . digoxin (LANOXIN) 0.125 MG tablet TAKE 1/2 TABLET EVERY DAY (SUBSTITUTED FOR  DIGITEK)  . DROPLET PEN NEEDLES 31G X 5 MM MISC USE AS DIRECTED WITH LEVEMIR  . ezetimibe (ZETIA) 10 MG tablet TAKE 1 TABLET EVERY DAY  . fenofibrate micronized (LOFIBRA)  134 MG capsule Take 1 capsule (134 mg total) by mouth daily.  . fluticasone (FLONASE) 50 MCG/ACT nasal spray USE 2 SPRAYS IN EACH NOSTRIL EVERY DAY AS NEEDED  . furosemide (LASIX) 40 MG tablet TAKE 1 TABLET TWICE DAILY  . glucose blood (ACCU-CHEK SMARTVIEW) test strip CHECK BLOOD SUGAR TWICE DAILY  . LEVEMIR FLEXTOUCH 100 UNIT/ML FlexPen INJECT 42 UNITS SUBCUTANEOUSLY ONE TIME DAILY (Patient taking differently: 5 Units. )  . levothyroxine (SYNTHROID) 50 MCG tablet TAKE 1 TABLET EVERY DAY  . losartan (COZAAR) 100 MG tablet TAKE 1 TABLET EVERY DAY  . magnesium oxide (MAG-OX) 400 MG tablet Take 400 mg by mouth 2 (two) times daily.  Marland Kitchen  meclizine (ANTIVERT) 25 MG tablet Take 1 tablet (25 mg total) by mouth 3 (three) times daily as needed for dizziness.  . Multiple Vitamin (MULTIVITAMIN WITH MINERALS) TABS tablet Take 1 tablet by mouth daily. Centrum Silver  . nystatin cream (MYCOSTATIN)   . olopatadine (PATADAY) 0.1 % ophthalmic solution Place 1 drop into both eyes daily.  Marland Kitchen omega-3 acid ethyl esters (LOVAZA) 1 g capsule TAKE 1 CAPSULE TWICE DAILY  . OVER THE COUNTER MEDICATION Take 2 tablets by mouth daily. Takes Beet Root tablets - 2 tablets daily   . oxyCODONE-acetaminophen (PERCOCET) 7.5-325 MG tablet Take 1 tablet by mouth every 6 (six) hours as needed (back pain.).  Marland Kitchen pantoprazole (PROTONIX) 40 MG tablet Take 40 mg by mouth.  . polyethylene glycol (MIRALAX / GLYCOLAX) 17 g packet Take 17 g by mouth daily as needed.   . pravastatin (PRAVACHOL) 10 MG tablet TAKE 1 TABLET FOUR TIMES WEEKLY  . Rivaroxaban (XARELTO) 15 MG TABS tablet Take 1 tablet (15 mg total) by mouth daily with supper.  Marland Kitchen rOPINIRole (REQUIP) 1 MG tablet TAKE 1 TABLET AT BEDTIME  . spironolactone (ALDACTONE) 25 MG tablet Take 0.5 tablets (12.5 mg total) by mouth daily.  Marland Kitchen tiZANidine (ZANAFLEX) 4 MG tablet TAKE 1 TABLET AT BEDTIME   Current Facility-Administered Medications for the 08/12/20 encounter (Office Visit) with Berniece Salines, DO  Medication  . triamcinolone acetonide (KENALOG-40) injection 40 mg     Allergies:   Gabapentin, Pregabalin, Morphine, Penicillins, Tape, Ace inhibitors, Carvedilol, Insulin glargine, Metoprolol, Pantoprazole, Procaine, Statins, and Lisinopril   Social History   Socioeconomic History  . Marital status: Married    Spouse name: Not on file  . Number of children: 6  . Years of education: Not on file  . Highest education level: Not on file  Occupational History  . Not on file  Tobacco Use  . Smoking status: Never Smoker  . Smokeless tobacco: Never Used  Vaping Use  . Vaping Use: Never used  Substance and Sexual Activity  . Alcohol use: No    Alcohol/week: 0.0 standard drinks  . Drug use: No  . Sexual activity: Not Currently  Other Topics Concern  . Not on file  Social History Narrative   wears sunscreen, brushes and flosses daily, see's dentist bi-annually, has smoke/carbon monoxide detectors, wears a seatbelt and practices gun safety   Social Determinants of Health   Financial Resource Strain:   . Difficulty of Paying Living Expenses: Not on file  Food Insecurity: No Food Insecurity  . Worried About Charity fundraiser in the Last Year: Never true  . Ran Out of Food in the Last Year: Never true  Transportation Needs: No Transportation Needs  . Lack of Transportation (Medical): No  . Lack of Transportation (Non-Medical): No  Physical Activity:   . Days of Exercise per Week: Not on file  . Minutes of Exercise per Session: Not on file  Stress:   . Feeling of Stress : Not on file  Social Connections:   . Frequency of Communication with Friends and Family: Not on file  . Frequency of Social Gatherings with Friends and Family: Not on file  . Attends Religious Services: Not on file  . Active Member of Clubs or Organizations: Not on file  . Attends Archivist Meetings: Not on file  . Marital Status: Not on file     Family History: The patient's  family history includes AAA (abdominal aortic aneurysm) in her brother; Cancer  in her father and sister; Dementia in her sister; Diabetes in an other family member; Heart attack in her brother; Heart failure in her mother; Hypertension in her mother; Stroke in her maternal grandmother.  ROS:   Review of Systems  Constitution: Negative for decreased appetite, fever and weight gain.  HENT: Negative for congestion, ear discharge, hoarse voice and sore throat.   Eyes: Negative for discharge, redness, vision loss in right eye and visual halos.  Cardiovascular: Negative for chest pain, dyspnea on exertion, leg swelling, orthopnea and palpitations.  Respiratory: Negative for cough, hemoptysis, shortness of breath and snoring.   Endocrine: Negative for heat intolerance and polyphagia.  Hematologic/Lymphatic: Negative for bleeding problem. Does not bruise/bleed easily.  Skin: Negative for flushing, nail changes, rash and suspicious lesions.  Musculoskeletal: Negative for arthritis, joint pain, muscle cramps, myalgias, neck pain and stiffness.  Gastrointestinal: Negative for abdominal pain, bowel incontinence, diarrhea and excessive appetite.  Genitourinary: Negative for decreased libido, genital sores and incomplete emptying.  Neurological: Negative for brief paralysis, focal weakness, headaches and loss of balance.  Psychiatric/Behavioral: Negative for altered mental status, depression and suicidal ideas.  Allergic/Immunologic: Negative for HIV exposure and persistent infections.    EKGs/Labs/Other Studies Reviewed:    The following studies were reviewed today:   EKG: None today  Recent Labs: 05/11/2020: Magnesium 2.6 05/25/2020: ALT 15; BUN 54; Creatinine, Ser 1.72; Hemoglobin 11.6; Platelets 324; Potassium 5.4; Sodium 138; TSH 2.440  Recent Lipid Panel    Component Value Date/Time   CHOL 142 05/25/2020 1215   TRIG 80 05/25/2020 1215   HDL 48 05/25/2020 1215   CHOLHDL 3.0 05/25/2020 1215    LDLCALC 78 05/25/2020 1215    Physical Exam:    VS:  BP (!) 128/58   Pulse 80   Ht 5\' 8"  (1.727 m)   Wt 182 lb 12.8 oz (82.9 kg)   SpO2 93%   BMI 27.79 kg/m     Wt Readings from Last 3 Encounters:  08/12/20 182 lb 12.8 oz (82.9 kg)  05/28/20 153 lb 3.2 oz (69.5 kg)  05/25/20 157 lb 6.4 oz (71.4 kg)     GEN: Well nourished, well developed in no acute distress HEENT: Normal NECK: No JVD; No carotid bruits LYMPHATICS: No lymphadenopathy CARDIAC: S1S2 noted,RRR no murmurs, rubs, gallops RESPIRATORY:  Clear to auscultation without rales, wheezing or rhonchi  ABDOMEN: Soft, non-tender, non-distended, +bowel sounds, no guarding. EXTREMITIES: +2 bilateral leg edema, No cyanosis, no clubbing MUSCULOSKELETAL:  No deformity  SKIN: Warm and dry NEUROLOGIC:  Alert and oriented x 3, non-focal PSYCHIATRIC:  Normal affect, good insight  ASSESSMENT:    1. Fatigue, unspecified type   2. Chronic systolic heart failure (HCC)   3. Permanent atrial fibrillation ([place])   4. Diabetic polyneuropathy associated with diabetes mellitus due to underlying condition ([place])   5. Mixed hyperlipidemia   6. ICD (implantable cardioverter-defibrillator), biventricular, in situ    PLAN:     1.  Since her last visit she has gained close to 20 pounds.  And she does have significant leg edema.  The patient prefers not to go to the hospital so we have planned to add metolazone 5 mg 30 minutes before her first Lasix dose daily she will take this for 5 days I will see her back in 2 weeks.  Should she have no improvement on this regimen.  It will be best to have the patient go to the emergency department / hospital to get some IV Lasix.  We will get blood work today which will include BMP and magnesium.  We will continue to monitor her kidney function.. Continue her Xarelto for A. fib.  The patient is in agreement with the above plan. The patient left the office in stable condition.  The patient will follow up  in 2 weeks or sooner if needed.   Medication Adjustments/Labs and Tests Ordered: Current medicines are reviewed at length with the patient today.  Concerns regarding medicines are outlined above.  Orders Placed This Encounter  Procedures  . Vitamin D (25 hydroxy)  . Basic metabolic panel  . Magnesium  . B12   Meds ordered this encounter  Medications  . metolazone (ZAROXOLYN) 5 MG tablet    Sig: Take daily 30 min before lasix for 5 days.    Dispense:  5 tablet    Refill:  0    Patient Instructions  Medication Instructions:  Your physician has recommended you make the following change in your medication:   TAKE: Metolazone 5mg  daily 30 min before lasix for 5 days.  *If you need a refill on your cardiac medications before your next appointment, please call your pharmacy*   Lab Work: Your physician recommends that you return for lab work today: bmp, mg, vitamin d, b12 If you have labs (blood work) drawn today and your tests are completely normal, you will receive your results only by: Marland Kitchen MyChart Message (if you have MyChart) OR . A paper copy in the mail If you have any lab test that is abnormal or we need to change your treatment, we will call you to review the results.   Testing/Procedures: None.   Follow-Up: At [facility], you and your health needs are our priority.  As part of our continuing mission to provide you with exceptional heart care, we have created designated Provider Care Teams.  These Care Teams include your primary Cardiologist (physician) and Advanced Practice Providers (APPs -  Physician Assistants and Nurse Practitioners) who all work together to provide you with the care you need, when you need it.  We recommend signing up for the patient portal called "MyChart".  Sign up information is provided on this After Visit Summary.  MyChart is used to connect with patients for Virtual Visits (Telemedicine).  Patients are able to view lab/test results, encounter  notes, upcoming appointments, etc.  Non-urgent messages can be sent to your provider as well.   To learn more about what you can do with MyChart, go to [email or web address].    Your next appointment:   2 week(s)  The format for your next appointment:   In Person  Provider:   Berniece Salines, DO   Other Instructions  Metolazone Oral Tablets What is this medicine? METOLAZONE (me TOLE a zone) is a diuretic. It helps you make more urine and to lose salt and excess water from your body. It treats swelling from heart or kidney disease. It also treats high blood pressure. This medicine may be used for other purposes; ask your health care provider or pharmacist if you have questions. COMMON BRAND NAME(S): Mykrox, Zaroxolyn What should I tell my health care provider before I take this medicine? They need to know if you have any of these conditions:  diabetes  gout  immune system problems, like lupus  kidney disease  liver disease  pancreatitis  small amount of urine or difficulty passing urine  an unusual or allergic reaction to metolazone, sulfa drugs, other medicines, foods, dyes, or preservatives  pregnant  or trying to get pregnant  breast-feeding How should I use this medicine? Take this drug by mouth. Take it as directed on the prescription label at the same time every day. Keep taking it unless your health care provider tells you to stop. Talk to your health care provider about the use of this drug in children. Special care may be needed. Overdosage: If you think you have taken too much of this medicine contact a poison control center or emergency room at once. NOTE: This medicine is only for you. Do not share this medicine with others. What if I miss a dose? If you miss a dose, take it as soon as you can. If it is almost time for your next dose, take only that dose. Do not take double or extra doses. What may interact with this medicine?  alcohol  antiinflammatory  drugs for pain or swelling  barbiturates for sleep or seizure control  digoxin  dofetilide  lithium  medicines for blood sugar  medicines for high blood pressure  medicines that relax muscles for surgery  methenamine  other diuretics  some medicines for pain  steroid hormones like cortisone, hydrocortisone, and prednisone  warfarin This list may not describe all possible interactions. Give your health care provider a list of all the medicines, herbs, non-prescription drugs, or dietary supplements you use. Also tell them if you smoke, drink alcohol, or use illegal drugs. Some items may interact with your medicine. What should I watch for while using this medicine? Visit your doctor or health care professional for regular checks on your progress. Check your blood pressure as directed. Ask your doctor or health care professional what your blood pressure should be and when you should contact him or her. You may need to be on a special diet while taking this medicine. Ask your doctor. Check with your doctor or health care professional if you get an attack of severe diarrhea, nausea and vomiting, or if you sweat a lot. The loss of too much body fluid can make it dangerous for you to take this medicine. You may get drowsy or dizzy. Do not drive, use machinery, or do anything that needs mental alertness until you know how this medicine affects you. Do not stand or sit up quickly, especially if you are an older patient. This reduces the risk of dizzy or fainting spells. Alcohol may interfere with the effect of this medicine. Avoid alcoholic drinks. This medicine may affect your blood sugar level. If you have diabetes, check with your doctor or health care professional before changing the dose of your diabetic medicine. This medicine can make you more sensitive to the sun. Keep out of the sun. If you cannot avoid being in the sun, wear protective clothing and use sunscreen. Do not use sun lamps  or tanning beds/booths. What side effects may I notice from receiving this medicine? Side effects that you should report to your doctor or health care professional as soon as possible:  allergic reactions such as skin rash or itching, hives, swelling of the lips, mouth, tongue, or throat  fast or irregular heartbeat, chest pain  feeling faint  fever, chills  gout pain  hot red lump on leg  muscle pain, cramps  nausea, vomiting  numbness or tingling in hands, feet  pain or difficulty when passing urine  redness, blistering, peeling or loosening of the skin, including inside the mouth  unusual bleeding or bruising  unusually weak or tired  yellowing of the eyes,  skin Side effects that usually do not require medical attention (report to your doctor or health care professional if they continue or are bothersome):  abdominal pain  blurred vision  constipation or diarrhea  dry mouth  headache This list may not describe all possible side effects. Call your doctor for medical advice about side effects. You may report side effects to FDA at 1-800-FDA-1088. Where should I keep my medicine? Keep out of the reach of children and pets. Store at room temperature between 20 and 25 degrees C (68 and 77 degrees F). Protect from light. Throw away any unused drug after the expiration date. NOTE: This sheet is a summary. It may not cover all possible information. If you have questions about this medicine, talk to your doctor, pharmacist, or health care provider.  2020 Elsevier/Gold Standard (2019-05-09 17:51:12)      Adopting a Healthy Lifestyle.  Know what a healthy weight is for you (roughly BMI <25) and aim to maintain this   Aim for 7+ servings of fruits and vegetables daily   65-80+ fluid ounces of water or unsweet tea for healthy kidneys   Limit to max 1 drink of alcohol per day; avoid smoking/tobacco   Limit animal fats in diet for cholesterol and heart health -  choose grass fed whenever available   Avoid highly processed foods, and foods high in saturated/trans fats   Aim for low stress - take time to unwind and care for your mental health   Aim for 150 min of moderate intensity exercise weekly for heart health, and weights twice weekly for bone health   Aim for 7-9 hours of sleep daily   When it comes to diets, agreement about the perfect plan isnt easy to find, even among the experts. Experts at the Center developed an idea known as the Healthy Eating Plate. Just imagine a plate divided into logical, healthy portions.   The emphasis is on diet quality:   Load up on vegetables and fruits - one-half of your plate: Aim for color and variety, and remember that potatoes dont count.   Go for whole grains - one-quarter of your plate: Whole wheat, barley, wheat berries, quinoa, oats, brown rice, and foods made with them. If you want pasta, go with whole wheat pasta.   Protein power - one-quarter of your plate: Fish, chicken, beans, and nuts are all healthy, versatile protein sources. Limit red meat.   The diet, however, does go beyond the plate, offering a few other suggestions.   Use healthy plant oils, such as olive, canola, soy, corn, sunflower and peanut. Check the labels, and avoid partially hydrogenated oil, which have unhealthy trans fats.   If youre thirsty, drink water. Coffee and tea are good in moderation, but skip sugary drinks and limit milk and dairy products to one or two daily servings.   The type of carbohydrate in the diet is more important than the amount. Some sources of carbohydrates, such as vegetables, fruits, whole grains, and beans-are healthier than others.   Finally, stay active  Signed, Berniece Salines, DO  08/12/2020 12:04 PM    [place]

## 2020-08-12 NOTE — Patient Instructions (Signed)
Medication Instructions:  Your physician has recommended you make the following change in your medication:   TAKE: Metolazone 5mg  daily 30 min before lasix for 5 days.  *If you need a refill on your cardiac medications before your next appointment, please call your pharmacy*   Lab Work: Your physician recommends that you return for lab work today: bmp, mg, vitamin d, b12 If you have labs (blood work) drawn today and your tests are completely normal, you will receive your results only by: Marland Kitchen MyChart Message (if you have MyChart) OR . A paper copy in the mail If you have any lab test that is abnormal or we need to change your treatment, we will call you to review the results.   Testing/Procedures: None.   Follow-Up: At [facility], you and your health needs are our priority.  As part of our continuing mission to provide you with exceptional heart care, we have created designated Provider Care Teams.  These Care Teams include your primary Cardiologist (physician) and Advanced Practice Providers (APPs -  Physician Assistants and Nurse Practitioners) who all work together to provide you with the care you need, when you need it.  We recommend signing up for the patient portal called "MyChart".  Sign up information is provided on this After Visit Summary.  MyChart is used to connect with patients for Virtual Visits (Telemedicine).  Patients are able to view lab/test results, encounter notes, upcoming appointments, etc.  Non-urgent messages can be sent to your provider as well.   To learn more about what you can do with MyChart, go to [email or web address].    Your next appointment:   2 week(s)  The format for your next appointment:   In Person  Provider:   Berniece Salines, DO   Other Instructions  Metolazone Oral Tablets What is this medicine? METOLAZONE (me TOLE a zone) is a diuretic. It helps you make more urine and to lose salt and excess water from your body. It treats swelling  from heart or kidney disease. It also treats high blood pressure. This medicine may be used for other purposes; ask your health care provider or pharmacist if you have questions. COMMON BRAND NAME(S): Mykrox, Zaroxolyn What should I tell my health care provider before I take this medicine? They need to know if you have any of these conditions:  diabetes  gout  immune system problems, like lupus  kidney disease  liver disease  pancreatitis  small amount of urine or difficulty passing urine  an unusual or allergic reaction to metolazone, sulfa drugs, other medicines, foods, dyes, or preservatives  pregnant or trying to get pregnant  breast-feeding How should I use this medicine? Take this drug by mouth. Take it as directed on the prescription label at the same time every day. Keep taking it unless your health care provider tells you to stop. Talk to your health care provider about the use of this drug in children. Special care may be needed. Overdosage: If you think you have taken too much of this medicine contact a poison control center or emergency room at once. NOTE: This medicine is only for you. Do not share this medicine with others. What if I miss a dose? If you miss a dose, take it as soon as you can. If it is almost time for your next dose, take only that dose. Do not take double or extra doses. What may interact with this medicine?  alcohol  antiinflammatory drugs for pain or swelling  barbiturates  for sleep or seizure control  digoxin  dofetilide  lithium  medicines for blood sugar  medicines for high blood pressure  medicines that relax muscles for surgery  methenamine  other diuretics  some medicines for pain  steroid hormones like cortisone, hydrocortisone, and prednisone  warfarin This list may not describe all possible interactions. Give your health care provider a list of all the medicines, herbs, non-prescription drugs, or dietary  supplements you use. Also tell them if you smoke, drink alcohol, or use illegal drugs. Some items may interact with your medicine. What should I watch for while using this medicine? Visit your doctor or health care professional for regular checks on your progress. Check your blood pressure as directed. Ask your doctor or health care professional what your blood pressure should be and when you should contact him or her. You may need to be on a special diet while taking this medicine. Ask your doctor. Check with your doctor or health care professional if you get an attack of severe diarrhea, nausea and vomiting, or if you sweat a lot. The loss of too much body fluid can make it dangerous for you to take this medicine. You may get drowsy or dizzy. Do not drive, use machinery, or do anything that needs mental alertness until you know how this medicine affects you. Do not stand or sit up quickly, especially if you are an older patient. This reduces the risk of dizzy or fainting spells. Alcohol may interfere with the effect of this medicine. Avoid alcoholic drinks. This medicine may affect your blood sugar level. If you have diabetes, check with your doctor or health care professional before changing the dose of your diabetic medicine. This medicine can make you more sensitive to the sun. Keep out of the sun. If you cannot avoid being in the sun, wear protective clothing and use sunscreen. Do not use sun lamps or tanning beds/booths. What side effects may I notice from receiving this medicine? Side effects that you should report to your doctor or health care professional as soon as possible:  allergic reactions such as skin rash or itching, hives, swelling of the lips, mouth, tongue, or throat  fast or irregular heartbeat, chest pain  feeling faint  fever, chills  gout pain  hot red lump on leg  muscle pain, cramps  nausea, vomiting  numbness or tingling in hands, feet  pain or difficulty when  passing urine  redness, blistering, peeling or loosening of the skin, including inside the mouth  unusual bleeding or bruising  unusually weak or tired  yellowing of the eyes, skin Side effects that usually do not require medical attention (report to your doctor or health care professional if they continue or are bothersome):  abdominal pain  blurred vision  constipation or diarrhea  dry mouth  headache This list may not describe all possible side effects. Call your doctor for medical advice about side effects. You may report side effects to FDA at 1-800-FDA-1088. Where should I keep my medicine? Keep out of the reach of children and pets. Store at room temperature between 20 and 25 degrees C (68 and 77 degrees F). Protect from light. Throw away any unused drug after the expiration date. NOTE: This sheet is a summary. It may not cover all possible information. If you have questions about this medicine, talk to your doctor, pharmacist, or health care provider.  2020 [employer] Standard (2019-05-09 17:51:12)

## 2020-08-13 LAB — BASIC METABOLIC PANEL
BUN/Creatinine Ratio: 31 — ABNORMAL HIGH (ref 12–28)
BUN: 62 mg/dL — ABNORMAL HIGH (ref 8–27)
CO2: 24 mmol/L (ref 20–29)
Calcium: 9 mg/dL (ref 8.7–10.3)
Chloride: 97 mmol/L (ref 96–106)
Creatinine, Ser: 1.99 mg/dL — ABNORMAL HIGH (ref 0.57–1.00)
GFR calc Af Amer: 26 mL/min/{1.73_m2} — ABNORMAL LOW (ref >59)
GFR calc non Af Amer: 23 mL/min/{1.73_m2} — ABNORMAL LOW (ref >59)
Glucose: 160 mg/dL — ABNORMAL HIGH (ref 65–99)
Potassium: 5.6 mmol/L — ABNORMAL HIGH (ref 3.5–5.2)
Sodium: 132 mmol/L — ABNORMAL LOW (ref 134–144)

## 2020-08-13 LAB — VITAMIN D 25 HYDROXY (VIT D DEFICIENCY, FRACTURES): Vit D, 25-Hydroxy: 61.7 ng/mL (ref 30.0–100.0)

## 2020-08-13 LAB — VITAMIN B12: Vitamin B-12: 986 pg/mL (ref 232–1245)

## 2020-08-13 LAB — MAGNESIUM: Magnesium: 2.9 mg/dL — ABNORMAL HIGH (ref 1.6–2.3)

## 2020-08-16 ENCOUNTER — Telehealth: Payer: Self-pay | Admitting: Cardiology

## 2020-08-16 NOTE — Telephone Encounter (Signed)
Per pt is wanting to have home health ordered by Dr Harriet Masson Per pt Herbie Baltimore is coming out tomorrow to check on pt's husband and would like to participate as well Will forward to Dr Harriet Masson for review and recommendations./cy

## 2020-08-16 NOTE — Telephone Encounter (Signed)
[name] is calling stating she spoke with her husbands home health nurse today in regards to her also receiving assistance and they advised her to ask her Cardiologist to write an order to receive care at the same time. Please advise.

## 2020-08-17 ENCOUNTER — Telehealth: Payer: Self-pay

## 2020-08-17 NOTE — Telephone Encounter (Signed)
That's fine we can order the home health for her. Thank you.

## 2020-08-17 NOTE — Telephone Encounter (Signed)
-----   Message from Berniece Salines, DO sent at 08/13/2020  1:09 PM EDT ----- Your kidney function is at baseline.  Vitamin D and B12 above normal

## 2020-08-17 NOTE — Telephone Encounter (Signed)
Left message on patients voicemail to please return our call.   I will also send the patient a letter at this time.  

## 2020-08-17 NOTE — Telephone Encounter (Signed)
Patient returning [name]'s call, connected call to [employer].

## 2020-08-18 NOTE — Telephone Encounter (Signed)
Transferred call to [employer]

## 2020-08-18 NOTE — Telephone Encounter (Signed)
Lm to call back ./cy 

## 2020-08-18 NOTE — Telephone Encounter (Signed)
Pt aware to have [employer] Herbie [place]) to fax orders for Dr Harriet Masson to sign .Adonis Housekeeper

## 2020-08-20 ENCOUNTER — Other Ambulatory Visit: Payer: Self-pay

## 2020-08-20 MED ORDER — OXYCODONE-ACETAMINOPHEN 7.5-325 MG PO TABS: 1.0000 | ORAL_TABLET | Freq: Four times a day (QID) | ORAL | 0 refills | Status: DC | PRN

## 2020-08-26 ENCOUNTER — Ambulatory Visit (INDEPENDENT_AMBULATORY_CARE_PROVIDER_SITE_OTHER): Payer: Medicare HMO | Admitting: Family Medicine

## 2020-08-26 ENCOUNTER — Encounter: Payer: Self-pay | Admitting: Family Medicine

## 2020-08-26 ENCOUNTER — Other Ambulatory Visit: Payer: Self-pay

## 2020-08-26 VITALS — BP 122/68 | HR 87 | Temp 97.2°F | Resp 18 | Ht 68.0 in | Wt 185.4 lb

## 2020-08-26 DIAGNOSIS — R0600 Dyspnea, unspecified: Secondary | ICD-10-CM | POA: Diagnosis not present

## 2020-08-26 DIAGNOSIS — E1142 Type 2 diabetes mellitus with diabetic polyneuropathy: Secondary | ICD-10-CM | POA: Diagnosis not present

## 2020-08-26 DIAGNOSIS — I5022 Chronic systolic (congestive) heart failure: Secondary | ICD-10-CM

## 2020-08-26 DIAGNOSIS — R2689 Other abnormalities of gait and mobility: Secondary | ICD-10-CM

## 2020-08-26 DIAGNOSIS — Z95810 Presence of automatic (implantable) cardiac defibrillator: Secondary | ICD-10-CM

## 2020-08-26 DIAGNOSIS — R531 Weakness: Secondary | ICD-10-CM

## 2020-08-26 DIAGNOSIS — E782 Mixed hyperlipidemia: Secondary | ICD-10-CM

## 2020-08-26 DIAGNOSIS — E559 Vitamin D deficiency, unspecified: Secondary | ICD-10-CM

## 2020-08-26 DIAGNOSIS — I482 Chronic atrial fibrillation, unspecified: Secondary | ICD-10-CM | POA: Diagnosis not present

## 2020-08-26 DIAGNOSIS — N184 Chronic kidney disease, stage 4 (severe): Secondary | ICD-10-CM

## 2020-08-26 NOTE — Progress Notes (Addendum)
Subjective:  Patient ID: [name], female    DOB: Dec 17, 1933  Age: 86 y.o. MRN: [id number]  Chief Complaint  Patient presents with  . Diabetes    HPI  Patient is an 86 year old white female who presents for follow-up of chronic medical problems which are numerous.  Most recently however she is complaining of severe constipation. The severe constipation occurred last week.  She took 2 bottles of mag citrate and self disimpaction. Took 2 hours for her bowels to move. Was given 5 pills (metolazone) last week for leg swelling to take 30 mg prior to her a.m. dose of Lasix.  After taking all five, pt was constipated.  States she drinks water between 80-120 oz of water a day. Also drinks coffee and green tea. Finally BMs moved and has had 2 BM since. Takes miralax occasionally.  Patient is very afraid of becoming constipated again.  Patient was seen by Dr. Jorene Minors for systolic congestive heart failure.  Patient had gained 20 pounds over the last 6 weeks.  She had significant edema when she saw cardiology as well as dyspnea on exertion.  As stated above she gave her 5 daily doses of metolazone.  The patient did not feel this helped her swelling at all.  She not not lose any weight. Pt is swelling in legs still. Elevates legs at night. Returns during the day. Pt has fatigue.  Patient is also on spironolactone, digoxin, and losartan.  Diabetes complicated by neuropathy: Patient is currently taking Levemir 5 units a day.  Her sugars range from 99-125.  She checks her feet every day.  She gets an annual eye exam.  She has had no hypoglycemia.  She does have polydipsia.  No polyphagia or polyuria.  Patient carries a diagnosis of of hypertensive heart disease with systolic congestive heart failure and chronic kidney disease as stated above.  Patient's blood pressure today is 122/68.  Patient says at home she has had systolic blood pressures in the 90s.  She denies dizziness.  She does feel weak.   She is requesting physical therapy at home to help with her balance.  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  Other cardiac medications include amlodipine 5 mg once daily, aspirin 81 mg once daily.  Patient has history of atrial fibrillation, ventricular fibrillation and third-degree AV block.  She has a pacemaker and defibrillator.  She is currently on Xarelto 15 mg daily.  Patient has chronic back pain.  She is on Percocet 7/2/325 mg 4 times a day as needed.  She frequently has to take these.  She also has tizanidine 4 mg 1 at bedtime  Patient is suffering from significant anxiety.  She currently has Xanax which she takes 0.5 mg once daily at nighttime.  PHQ-9 of 9.  Hyperlipidemia: Patient is on pravastatin, Zetia, fenofibrate.   Current Outpatient Medications on File Prior to Visit  Medication Sig Dispense Refill  . Accu-Chek FastClix Lancets MISC TEST BLOOD SUGAR TWICE DAILY E11.69 204 each 3  . Alcohol Swabs (B-D SINGLE USE SWABS REGULAR) PADS USE TWICE DAILY  E11.69 200 each 3  . allopurinol (ZYLOPRIM) 100 MG tablet TAKE 1 TABLET EVERY DAY (Patient not taking: Reported on 08/26/2020) 90 tablet 3  . ALPRAZolam (XANAX) 0.5 MG tablet TAKE ONE TABLET BY MOUTH EVERY NIGHT AT BEDTIME FOR INSOMNIA 90 tablet 0  . amLODipine (NORVASC) 5  MG tablet Take 5 mg by mouth.    . Apple Cider Vinegar 188 MG CAPS Take 2 capsules by mouth daily.    Marland Kitchen aspirin 81 MG chewable tablet Chew 81 mg by mouth daily.    . calcium carbonate (OSCAL) 1500 (600 Ca) MG TABS tablet Take by mouth.    . digoxin (LANOXIN) 0.125 MG tablet TAKE 1/2 TABLET EVERY DAY (SUBSTITUTED FOR  DIGITEK) 45 tablet 1  . DROPLET PEN NEEDLES 31G X 5 MM MISC USE AS DIRECTED WITH LEVEMIR 300 each 3  . ezetimibe (ZETIA) 10 MG tablet TAKE 1 TABLET EVERY DAY 90 tablet 1  . fenofibrate micronized (LOFIBRA) 134 MG capsule Take 1 capsule (134 mg  total) by mouth daily. 90 capsule 1  . fluticasone (FLONASE) 50 MCG/ACT nasal spray USE 2 SPRAYS IN EACH NOSTRIL EVERY DAY AS NEEDED 48 g 3  . furosemide (LASIX) 40 MG tablet TAKE 1 TABLET TWICE DAILY 180 tablet 1  . glucose blood (ACCU-CHEK SMARTVIEW) test strip CHECK BLOOD SUGAR TWICE DAILY 200 strip 3  . LEVEMIR FLEXTOUCH 100 UNIT/ML FlexPen INJECT 42 UNITS SUBCUTANEOUSLY ONE TIME DAILY (Patient taking differently: 5 Units. ) 45 mL 1  . levothyroxine (SYNTHROID) 50 MCG tablet TAKE 1 TABLET EVERY DAY 90 tablet 1  . losartan (COZAAR) 100 MG tablet TAKE 1 TABLET EVERY DAY 90 tablet 1  . magnesium oxide (MAG-OX) 400 MG tablet Take 400 mg by mouth 2 (two) times daily.    . meclizine (ANTIVERT) 25 MG tablet Take 1 tablet (25 mg total) by mouth 3 (three) times daily as needed for dizziness. 30 tablet 0  . metolazone (ZAROXOLYN) 5 MG tablet Take daily 30 min before lasix for 5 days. 5 tablet 0  . Multiple Vitamin (MULTIVITAMIN WITH MINERALS) TABS tablet Take 1 tablet by mouth daily. Centrum Silver    . nystatin cream (MYCOSTATIN)     . olopatadine (PATADAY) 0.1 % ophthalmic solution Place 1 drop into both eyes daily.    Marland Kitchen omega-3 acid ethyl esters (LOVAZA) 1 g capsule TAKE 1 CAPSULE TWICE DAILY 180 capsule 1  . OVER THE COUNTER MEDICATION Take 2 tablets by mouth daily. Takes Beet Root tablets - 2 tablets daily     . oxyCODONE-acetaminophen (PERCOCET) 7.5-325 MG tablet Take 1 tablet by mouth every 6 (six) hours as needed (back pain.). 120 tablet 0  . pantoprazole (PROTONIX) 40 MG tablet Take 40 mg by mouth.    . polyethylene glycol (MIRALAX / GLYCOLAX) 17 g packet Take 17 g by mouth daily as needed.     . pravastatin (PRAVACHOL) 10 MG tablet TAKE 1 TABLET FOUR TIMES WEEKLY 48 tablet 1  . Rivaroxaban (XARELTO) 15 MG TABS tablet Take 1 tablet (15 mg total) by mouth daily with supper. 90 tablet 3  . rOPINIRole (REQUIP) 1 MG tablet TAKE 1 TABLET AT BEDTIME 90 tablet 2  . spironolactone (ALDACTONE) 25 MG  tablet Take 0.5 tablets (12.5 mg total) by mouth daily. 45 tablet 1  . tiZANidine (ZANAFLEX) 4 MG tablet TAKE 1 TABLET AT BEDTIME 90 tablet 0   Current Facility-Administered Medications on File Prior to Visit  Medication Dose Route Frequency Provider Last Rate Last Admin  . triamcinolone acetonide (KENALOG-40) injection 40 mg  40 mg Intra-articular Once [name], MD       Past Medical History:  Diagnosis Date  . Amiodarone pulmonary toxicity   . Anemia    no GI bleeding; on iron  . Arthritis   .  Atrial fibrillation ([place])   . Biventricular ICD (implantable cardiac defibrillator) in place    BiV ICD for nonischemic cardiomyopathy; BiV responder  . Carotid artery occlusion   . CHF (congestive heart failure) ([place])   . Chronic a-fib ([place])    on xarelto; failed DCCV  . Chronic kidney disease, stage 4 (severe) ([place])   . Diabetes ([place])   . Fibromyalgia   . Full dentures   . GAD (generalized anxiety disorder)   . GERD (gastroesophageal reflux disease)   . H/O cardiovascular stress test 08/04/2010   normal imaging; EF 61%  . H/O echocardiogram 07/05/2010   EF 45-50%; aortic valve-mildly sclerotic; LA-mod dilaterd   . Hyperlipemia   . Hypertension   . Hypertensive heart disease with heart failure ([place])   . Hypothalamic disease ([place])    on thyroid supplement  . Hypothyroidism   . Hypothyroidism   . LBBB (left bundle branch block)   . Mixed hyperlipidemia   . Occlusion and stenosis of right carotid artery   . Other persistent atrial fibrillation ([place])   . Pneumonia   . Presence of automatic (implantable) cardiac defibrillator   . PVD (peripheral vascular disease) ([place])    L RA stent 1994  . RAS (renal artery stenosis) ([place])    a. renal dopp (12/18/08)- Right RA-<60%; L RA stent- open; nl size and shape in both kidneys;  b.  Rena Artery [name] (3/16):  SMA with > 70% stenosis, Bilateral prox RA with 1-59%  . Renal insufficiency, mild    05/01/12 Creat 1.47  . Restless leg syndrome     . Type 2 diabetes mellitus with diabetic neuropathy ([place])   . VT (ventricular tachycardia) ([place]) 9/11   polymorphic VT probably related to sotalol therapy  . Wears glasses    Past Surgical History:  Procedure Laterality Date  . ABDOMINAL HYSTERECTOMY    . APPENDECTOMY    . AV NODE ABLATION  06/08/2006   performed by Dr. Beckie Salts; due to Afib with RVR and tachy brady syndrome  . BIV ICD GENERATOR CHANGEOUT N/A 12/31/2017   Procedure: BIV ICD GENERATOR CHANGEOUT;  Surgeon: Evans Lance, MD;  Location: [facility];  Service: Cardiovascular;  Laterality: N/A;  . BIV ICD GENERTAOR CHANGE OUT  11/15/10   Medtronic Protecta [id number] serial #[id number]  . BiV ICD Placement  06/17/2007; 04/20/14   Medtronic Concerta [id number]; RA lead-Med 5076/53cm, [id number]; RV lead- SJM 7121/65cm, [id number]; LV lead- Med 4194/88cm, [id number]; gen change 04/2014 by Dr Lovena Le  . BIV PACEMAKER GENERATOR CHANGE OUT N/A 04/20/2014   Procedure: BIV PACEMAKER GENERATOR CHANGE OUT;  Surgeon: Evans Lance, MD;  Location: [facility] CATH [facility];  Service: Cardiovascular;  Laterality: N/A;  . BRAIN MENINGIOMA EXCISION  2011   L frontal [facility]   . BREAST SURGERY     lumpectomy right  . CARDIOVERSION  07/21/05   converted to sinus brady  . CAROTID ENDARTERECTOMY Right 09/24/2018   with bovine pericardial patch angioplasty  . CATARACT EXTRACTION W/ INTRAOCULAR LENS  IMPLANT, BILATERAL    . CHOLECYSTECTOMY     Dr. Sherald Hess in [place] and Dr. Lyda Jester follows; surgical stricture post procedure with stent placement  . COLONOSCOPY W/ BIOPSIES AND POLYPECTOMY    . DILATION AND CURETTAGE OF UTERUS    . ENDARTERECTOMY Right 09/24/2018   Procedure: ENDARTERECTOMY CAROTID RIGHT;  Surgeon: Angelia Mould, MD;  Location: [facility];  Service: Vascular;  Laterality: Right;  . [place]  L Renal artery stent     Family History  Problem Relation Age of Onset  . Heart failure Mother   . Hypertension  Mother   . Cancer Father        lung  . Dementia Sister   . Cancer Sister        pancreatic, renal and thyroid  . Heart attack Brother   . AAA (abdominal aortic aneurysm) Brother   . Stroke Maternal Grandmother   . Diabetes Other    Social History   Socioeconomic History  . Marital status: Married    Spouse name: Not on file  . Number of children: 6  . Years of education: Not on file  . Highest education level: Not on file  Occupational History  . Not on file  Tobacco Use  . Smoking status: Never Smoker  . Smokeless tobacco: Never Used  Vaping Use  . Vaping Use: Never used  Substance and Sexual Activity  . Alcohol use: No    Alcohol/week: 0.0 standard drinks  . Drug use: No  . Sexual activity: Not Currently  Other Topics Concern  . Not on file  Social History Narrative   wears sunscreen, brushes and flosses daily, see's dentist bi-annually, has smoke/carbon monoxide detectors, wears a seatbelt and practices gun safety   Social Determinants of Health   Financial Resource Strain:   . Difficulty of Paying Living Expenses: Not on file  Food Insecurity: No Food Insecurity  . Worried About Charity fundraiser in the Last Year: Never true  . Ran Out of Food in the Last Year: Never true  Transportation Needs: No Transportation Needs  . Lack of Transportation (Medical): No  . Lack of Transportation (Non-Medical): No  Physical Activity:   . Days of Exercise per Week: Not on file  . Minutes of Exercise per Session: Not on file  Stress:   . Feeling of Stress : Not on file  Social Connections:   . Frequency of Communication with Friends and Family: Not on file  . Frequency of Social Gatherings with Friends and Family: Not on file  . Attends Religious Services: Not on file  . Active Member of Clubs or Organizations: Not on file  . Attends Archivist Meetings: Not on file  . Marital Status: Not on file    Review of Systems  Constitutional: Positive for fatigue  and unexpected weight change. Negative for fever.  HENT: Negative for congestion, sinus pressure and sore throat.   Respiratory: Negative for cough.   Cardiovascular: Positive for leg swelling.  Gastrointestinal: Positive for constipation. Negative for nausea and vomiting.  Genitourinary: Negative for dysuria and urgency.  Musculoskeletal: Positive for arthralgias, back pain and myalgias.     Objective:  BP 122/68   Pulse 87   Temp (!) 97.2 F (36.2 C)   Resp 18   Ht 5\' 8"  (1.727 m)   Wt 185 lb 6.4 oz (84.1 kg)   SpO2 99%   BMI 28.19 kg/m   BP/Weight 08/26/2020 08/12/2020 6/56/8127  Systolic BP 517 001 749  Diastolic BP 68 58 57  Wt. (Lbs) 185.4 182.8 153.2  BMI 28.19 27.79 24.73    Physical Exam Vitals reviewed.  Constitutional:      Appearance: Normal appearance. She is normal weight.  Neck:     Vascular: No carotid bruit.  Cardiovascular:     Rate and Rhythm: Normal rate. Rhythm irregular.     Pulses: Normal pulses.     Heart  sounds: Murmur heard.   Pulmonary:     Effort: Pulmonary effort is normal. No respiratory distress.     Breath sounds: Normal breath sounds.  Abdominal:     General: Abdomen is flat. Bowel sounds are normal.     Palpations: Abdomen is soft.     Tenderness: There is no abdominal tenderness.  Musculoskeletal:     Right lower leg: Edema (2 +) present.     Left lower leg: Edema (3 +) present.  Neurological:     Mental Status: She is alert and oriented to person, place, and time.  Psychiatric:        Mood and Affect: Mood normal.        Behavior: Behavior normal.     Diabetic Foot Exam - Simple   No data filed       Lab Results  Component Value Date   WBC 5.7 05/25/2020   HGB 11.6 05/25/2020   HCT 36.7 05/25/2020   PLT 324 05/25/2020   GLUCOSE 160 (H) 08/12/2020   CHOL 142 05/25/2020   TRIG 80 05/25/2020   HDL 48 05/25/2020   LDLCALC 78 05/25/2020   ALT 15 05/25/2020   AST 21 05/25/2020   NA 132 (L) 08/12/2020   K 5.6 (H)  08/12/2020   CL 97 08/12/2020   CREATININE 1.99 (H) 08/12/2020   BUN 62 (H) 08/12/2020   CO2 24 08/12/2020   TSH 2.440 05/25/2020   INR 0.96 09/24/2018   HGBA1C 6.2 (H) 05/25/2020   MICROALBUR 10 12/25/2019      Assessment & Plan:   1. Chronic systolic congestive heart failure ([place]) Concerned about acute decompensation of congestive heart failure. Restart metolazone 2.5 mg 30 minutes prior to her daily Lasix in a.m. Check proBNP If swelling persists and or patient becomes short of breath she will need to go to the emergency department.  I discussed the possibility of giving her IV Lasix at special procedures at the hospital however patient does not feel she can leave her husband and son during the day tomorrow.  She prefers if she worsens to go to the emergency department after hours. - Ambulatory referral to Home Health  2. Diabetic polyneuropathy associated with type 2 diabetes mellitus ([place]) Well-controlled. Continue eating healthy. Check feet daily and eye exam annually. No change to medications. Check sugars daily. - CBC with Differential/Platelet - Comprehensive metabolic panel - Hemoglobin A1c  3. Chronic atrial fibrillation (HCC) Chronically in atrial fibrillation.  Rate is controlled. Continue current medications. Management per cardiology  4. Mixed hyperlipidemia Continue current medications. Usually well controlled but will await labs to make any determinations. Low-fat diet. - Lipid panel  5. Mild vitamin D deficiency Well-controlled on recent blood work done elsewhere.  6. Automatic implantable cardioverter-defibrillator in situ Management per cardiology  7. Imbalance work on strengthening and balance exercises. - Ambulatory referral to Home Health  8. Weakness-recommend work on Midwife exercises. - Ambulatory referral to [place]. Dyspnea, unspecified type - Pro b natriuretic peptide  10. Chronic kidney disease, stage IV  (severe) (HCC) Stable.  Follows with nephrology.  11. Morbid obesity ([place]) Recommend continue to eat healthy particularly low-salt, low-fat, and low sugar.   Orders Placed This Encounter  Procedures  . CBC with Differential/Platelet  . Comprehensive metabolic panel  . Hemoglobin A1c  . Lipid panel  . Pro b natriuretic peptide  . Ambulatory referral to [place]     I spent 30 minutes dedicated to  the care of this patient on the date of this encounter to include face-to-face time with the patient, as well as: Long discussion concerning the stress related to caring for her family.  Discussed home health care needs for her as well as her husband and son. Discussed possibility of having to go to the emergency department over the weekend.  History, physical exam, and plan all documented in the electronic medical record.  Communication with Dr.Tobb via secure chat through epic.  Follow-up: Return in about 3 months (around 11/26/2020) for fasting.  An After Visit Summary was printed and given to the patient.  [name], MD [facility] [phone]  Addendum: Lab work was called stat in the middle of the night.  Hemoglobin was 6.6 critical.  Patient's proBNP was 8481.  Creatinine is worsened from 1.99-2.53.  Patient was called first thing this morning and recommended to go directly to the emergency department either by private vehicle as a passenger or by ambulance.  She is trying to get her daughter and will call us back to let us know that she was able to get to the emergency department.  Patient is obviously in acute on chronic congestive heart failure as well as being significant frequently anemic.  I did tell the patient to hold her blood thinners.  [name], MD

## 2020-08-26 NOTE — Patient Instructions (Signed)
Take miralax twice a day.  Avoid salt.

## 2020-08-27 DIAGNOSIS — J9811 Atelectasis: Secondary | ICD-10-CM | POA: Diagnosis not present

## 2020-08-27 DIAGNOSIS — I130 Hypertensive heart and chronic kidney disease with heart failure and stage 1 through stage 4 chronic kidney disease, or unspecified chronic kidney disease: Secondary | ICD-10-CM | POA: Diagnosis not present

## 2020-08-27 DIAGNOSIS — Z7901 Long term (current) use of anticoagulants: Secondary | ICD-10-CM | POA: Diagnosis not present

## 2020-08-27 DIAGNOSIS — N179 Acute kidney failure, unspecified: Secondary | ICD-10-CM | POA: Diagnosis not present

## 2020-08-27 DIAGNOSIS — D62 Acute posthemorrhagic anemia: Secondary | ICD-10-CM | POA: Diagnosis not present

## 2020-08-27 DIAGNOSIS — N184 Chronic kidney disease, stage 4 (severe): Secondary | ICD-10-CM | POA: Diagnosis not present

## 2020-08-27 DIAGNOSIS — K254 Chronic or unspecified gastric ulcer with hemorrhage: Secondary | ICD-10-CM | POA: Diagnosis not present

## 2020-08-27 DIAGNOSIS — K317 Polyp of stomach and duodenum: Secondary | ICD-10-CM | POA: Diagnosis not present

## 2020-08-27 DIAGNOSIS — D649 Anemia, unspecified: Secondary | ICD-10-CM | POA: Diagnosis not present

## 2020-08-27 DIAGNOSIS — Z950 Presence of cardiac pacemaker: Secondary | ICD-10-CM | POA: Diagnosis not present

## 2020-08-27 DIAGNOSIS — I517 Cardiomegaly: Secondary | ICD-10-CM | POA: Diagnosis not present

## 2020-08-27 DIAGNOSIS — K2950 Unspecified chronic gastritis without bleeding: Secondary | ICD-10-CM | POA: Diagnosis not present

## 2020-08-27 DIAGNOSIS — R531 Weakness: Secondary | ICD-10-CM | POA: Diagnosis not present

## 2020-08-27 DIAGNOSIS — I4821 Permanent atrial fibrillation: Secondary | ICD-10-CM | POA: Diagnosis not present

## 2020-08-27 DIAGNOSIS — I119 Hypertensive heart disease without heart failure: Secondary | ICD-10-CM | POA: Diagnosis not present

## 2020-08-27 DIAGNOSIS — I5023 Acute on chronic systolic (congestive) heart failure: Secondary | ICD-10-CM | POA: Diagnosis not present

## 2020-08-27 DIAGNOSIS — K259 Gastric ulcer, unspecified as acute or chronic, without hemorrhage or perforation: Secondary | ICD-10-CM | POA: Diagnosis not present

## 2020-08-27 DIAGNOSIS — J811 Chronic pulmonary edema: Secondary | ICD-10-CM | POA: Diagnosis not present

## 2020-08-27 DIAGNOSIS — K922 Gastrointestinal hemorrhage, unspecified: Secondary | ICD-10-CM | POA: Diagnosis not present

## 2020-08-27 DIAGNOSIS — E119 Type 2 diabetes mellitus without complications: Secondary | ICD-10-CM | POA: Diagnosis not present

## 2020-08-27 DIAGNOSIS — I5022 Chronic systolic (congestive) heart failure: Secondary | ICD-10-CM | POA: Diagnosis not present

## 2020-08-27 DIAGNOSIS — I10 Essential (primary) hypertension: Secondary | ICD-10-CM | POA: Diagnosis not present

## 2020-08-27 DIAGNOSIS — E860 Dehydration: Secondary | ICD-10-CM | POA: Diagnosis not present

## 2020-08-27 DIAGNOSIS — I428 Other cardiomyopathies: Secondary | ICD-10-CM | POA: Diagnosis not present

## 2020-08-27 LAB — CBC WITH DIFFERENTIAL/PLATELET
Basophils Absolute: 0 10*3/uL (ref 0.0–0.2)
Basos: 1 %
EOS (ABSOLUTE): 0 10*3/uL (ref 0.0–0.4)
Eos: 1 %
Hematocrit: 22.7 % — ABNORMAL LOW (ref 34.0–46.6)
Hemoglobin: 6.6 g/dL — CL (ref 11.1–15.9)
Immature Grans (Abs): 0 10*3/uL (ref 0.0–0.1)
Immature Granulocytes: 0 %
Lymphocytes Absolute: 0.5 10*3/uL — ABNORMAL LOW (ref 0.7–3.1)
Lymphs: 6 %
MCH: 25.3 pg — ABNORMAL LOW (ref 26.6–33.0)
MCHC: 29.1 g/dL — ABNORMAL LOW (ref 31.5–35.7)
MCV: 87 fL (ref 79–97)
Monocytes Absolute: 0.4 10*3/uL (ref 0.1–0.9)
Monocytes: 5 %
Neutrophils Absolute: 7.1 10*3/uL — ABNORMAL HIGH (ref 1.4–7.0)
Neutrophils: 87 %
Platelets: 477 10*3/uL — ABNORMAL HIGH (ref 150–450)
RBC: 2.61 x10E6/uL — CL (ref 3.77–5.28)
RDW: 13.7 % (ref 11.7–15.4)
WBC: 8.1 10*3/uL (ref 3.4–10.8)

## 2020-08-27 LAB — COMPREHENSIVE METABOLIC PANEL
ALT: 18 IU/L (ref 0–32)
AST: 23 IU/L (ref 0–40)
Albumin/Globulin Ratio: 1.3 (ref 1.2–2.2)
Albumin: 3.1 g/dL — ABNORMAL LOW (ref 3.6–4.6)
Alkaline Phosphatase: 35 IU/L — ABNORMAL LOW (ref 44–121)
BUN/Creatinine Ratio: 25 (ref 12–28)
BUN: 63 mg/dL — ABNORMAL HIGH (ref 8–27)
Bilirubin Total: 0.3 mg/dL (ref 0.0–1.2)
CO2: 24 mmol/L (ref 20–29)
Calcium: 8.8 mg/dL (ref 8.7–10.3)
Chloride: 96 mmol/L (ref 96–106)
Creatinine, Ser: 2.53 mg/dL — ABNORMAL HIGH (ref 0.57–1.00)
GFR calc Af Amer: 20 mL/min/{1.73_m2} — ABNORMAL LOW (ref >59)
GFR calc non Af Amer: 17 mL/min/{1.73_m2} — ABNORMAL LOW (ref >59)
Globulin, Total: 2.3 g/dL (ref 1.5–4.5)
Glucose: 98 mg/dL (ref 65–99)
Potassium: 5 mmol/L (ref 3.5–5.2)
Sodium: 133 mmol/L — ABNORMAL LOW (ref 134–144)
Total Protein: 5.4 g/dL — ABNORMAL LOW (ref 6.0–8.5)

## 2020-08-27 LAB — PRO B NATRIURETIC PEPTIDE: NT-Pro BNP: 8481 pg/mL — ABNORMAL HIGH (ref 0–738)

## 2020-08-27 LAB — HEMOGLOBIN A1C
Est. average glucose Bld gHb Est-mCnc: 117 mg/dL
Hgb A1c MFr Bld: 5.7 % — ABNORMAL HIGH (ref 4.8–5.6)

## 2020-08-27 LAB — CARDIOVASCULAR RISK ASSESSMENT

## 2020-08-27 LAB — LIPID PANEL
Chol/HDL Ratio: 3.2 ratio (ref 0.0–4.4)
Cholesterol, Total: 99 mg/dL — ABNORMAL LOW (ref 100–199)
HDL: 31 mg/dL — ABNORMAL LOW (ref >39)
LDL Chol Calc (NIH): 52 mg/dL (ref 0–99)
Triglycerides: 78 mg/dL (ref 0–149)
VLDL Cholesterol Cal: 16 mg/dL (ref 5–40)

## 2020-08-28 DIAGNOSIS — Z950 Presence of cardiac pacemaker: Secondary | ICD-10-CM | POA: Diagnosis not present

## 2020-08-28 DIAGNOSIS — D649 Anemia, unspecified: Secondary | ICD-10-CM | POA: Diagnosis not present

## 2020-08-28 DIAGNOSIS — I4821 Permanent atrial fibrillation: Secondary | ICD-10-CM | POA: Diagnosis not present

## 2020-08-28 DIAGNOSIS — I5022 Chronic systolic (congestive) heart failure: Secondary | ICD-10-CM | POA: Diagnosis not present

## 2020-08-29 DIAGNOSIS — I4821 Permanent atrial fibrillation: Secondary | ICD-10-CM | POA: Diagnosis not present

## 2020-08-29 DIAGNOSIS — Z950 Presence of cardiac pacemaker: Secondary | ICD-10-CM | POA: Diagnosis not present

## 2020-08-29 DIAGNOSIS — D649 Anemia, unspecified: Secondary | ICD-10-CM | POA: Diagnosis not present

## 2020-08-29 DIAGNOSIS — I5022 Chronic systolic (congestive) heart failure: Secondary | ICD-10-CM | POA: Diagnosis not present

## 2020-08-30 DIAGNOSIS — E785 Hyperlipidemia, unspecified: Secondary | ICD-10-CM | POA: Insufficient documentation

## 2020-08-30 DIAGNOSIS — I4819 Other persistent atrial fibrillation: Secondary | ICD-10-CM | POA: Insufficient documentation

## 2020-08-30 DIAGNOSIS — M797 Fibromyalgia: Secondary | ICD-10-CM | POA: Insufficient documentation

## 2020-08-30 DIAGNOSIS — N184 Chronic kidney disease, stage 4 (severe): Secondary | ICD-10-CM | POA: Insufficient documentation

## 2020-08-30 DIAGNOSIS — I482 Chronic atrial fibrillation, unspecified: Secondary | ICD-10-CM | POA: Insufficient documentation

## 2020-08-30 DIAGNOSIS — Z972 Presence of dental prosthetic device (complete) (partial): Secondary | ICD-10-CM | POA: Insufficient documentation

## 2020-08-30 DIAGNOSIS — K08109 Complete loss of teeth, unspecified cause, unspecified class: Secondary | ICD-10-CM | POA: Insufficient documentation

## 2020-08-30 DIAGNOSIS — E039 Hypothyroidism, unspecified: Secondary | ICD-10-CM | POA: Insufficient documentation

## 2020-08-30 DIAGNOSIS — J189 Pneumonia, unspecified organism: Secondary | ICD-10-CM | POA: Insufficient documentation

## 2020-08-30 DIAGNOSIS — E1140 Type 2 diabetes mellitus with diabetic neuropathy, unspecified: Secondary | ICD-10-CM | POA: Insufficient documentation

## 2020-08-30 DIAGNOSIS — Z973 Presence of spectacles and contact lenses: Secondary | ICD-10-CM | POA: Insufficient documentation

## 2020-08-30 DIAGNOSIS — K219 Gastro-esophageal reflux disease without esophagitis: Secondary | ICD-10-CM | POA: Insufficient documentation

## 2020-08-30 DIAGNOSIS — I509 Heart failure, unspecified: Secondary | ICD-10-CM | POA: Insufficient documentation

## 2020-08-30 DIAGNOSIS — G2581 Restless legs syndrome: Secondary | ICD-10-CM | POA: Insufficient documentation

## 2020-08-30 DIAGNOSIS — E237 Disorder of pituitary gland, unspecified: Secondary | ICD-10-CM | POA: Insufficient documentation

## 2020-08-30 DIAGNOSIS — I5022 Chronic systolic (congestive) heart failure: Secondary | ICD-10-CM | POA: Diagnosis not present

## 2020-08-30 DIAGNOSIS — E119 Type 2 diabetes mellitus without complications: Secondary | ICD-10-CM | POA: Insufficient documentation

## 2020-08-30 DIAGNOSIS — M1990 Unspecified osteoarthritis, unspecified site: Secondary | ICD-10-CM | POA: Insufficient documentation

## 2020-08-30 DIAGNOSIS — I447 Left bundle-branch block, unspecified: Secondary | ICD-10-CM | POA: Insufficient documentation

## 2020-08-30 DIAGNOSIS — J984 Other disorders of lung: Secondary | ICD-10-CM | POA: Insufficient documentation

## 2020-08-30 DIAGNOSIS — I4821 Permanent atrial fibrillation: Secondary | ICD-10-CM | POA: Diagnosis not present

## 2020-08-30 DIAGNOSIS — Z95810 Presence of automatic (implantable) cardiac defibrillator: Secondary | ICD-10-CM | POA: Insufficient documentation

## 2020-08-30 DIAGNOSIS — N289 Disorder of kidney and ureter, unspecified: Secondary | ICD-10-CM | POA: Insufficient documentation

## 2020-08-30 DIAGNOSIS — I10 Essential (primary) hypertension: Secondary | ICD-10-CM | POA: Insufficient documentation

## 2020-08-30 DIAGNOSIS — I6529 Occlusion and stenosis of unspecified carotid artery: Secondary | ICD-10-CM | POA: Insufficient documentation

## 2020-08-30 DIAGNOSIS — D649 Anemia, unspecified: Secondary | ICD-10-CM | POA: Insufficient documentation

## 2020-08-30 DIAGNOSIS — I739 Peripheral vascular disease, unspecified: Secondary | ICD-10-CM | POA: Insufficient documentation

## 2020-08-30 DIAGNOSIS — I6521 Occlusion and stenosis of right carotid artery: Secondary | ICD-10-CM | POA: Insufficient documentation

## 2020-08-30 DIAGNOSIS — Z950 Presence of cardiac pacemaker: Secondary | ICD-10-CM | POA: Diagnosis not present

## 2020-08-30 DIAGNOSIS — I110 Hypertensive heart disease with heart failure: Secondary | ICD-10-CM | POA: Insufficient documentation

## 2020-08-30 DIAGNOSIS — I701 Atherosclerosis of renal artery: Secondary | ICD-10-CM | POA: Insufficient documentation

## 2020-08-30 DIAGNOSIS — T462X5A Adverse effect of other antidysrhythmic drugs, initial encounter: Secondary | ICD-10-CM | POA: Insufficient documentation

## 2020-08-31 ENCOUNTER — Telehealth: Payer: Self-pay

## 2020-08-31 NOTE — Telephone Encounter (Signed)
°  Transition Care Management Follow-up Telephone Call  [name] [birth year]  Admit Date: 08/27/20 Discharge Date: 08/30/20 Diagnoses:  1- Acute on Chronic iron deficiency anemia 2- Nonbleeding gastric ulcers in the antrum (2) 3- Heme-positive stool 4- Acute on Chronic CHF with EF 30-35% 5- Acute kidney injury on CKD stage 3   2 day post discharge: 09/01/20 7 day post discharge: 09/06/20 14 day post discharge: 09/13/20  [name] was discharged from [facility] on 08/30/20 with the diagnoses listed above.  She was contacted today via telephone in regards to transition of care.    Patient complained of increased swelling in arms and legs with no improvement after increased diuretics.  Patient was having trouble laying flat due to [facility]. BUN 72 CREAT 2.40, Pro-BNP 10100.  CXR showed cardiomegaly with mild pulmonary vascular congestion and bibasilar atelectasis.  Discharge BUN 57, CREAT 1.90, Pro-BNP 18500  HGB of 7.2 - received TWO units of packed red blood cells on 08/27/20.  Discharge HGB 8.5.  EGD showed two nonbleeding gastric ulcers.  H pylori results are pending.   Discharge Instructions:  -Continue PPI therapy 8-12 weeks -May resume Xeralto after one week if no bleeding noted -Follow-up with PCP 1 week (Scheduled with Dr Holly Bodily 11/29) [place] -Follow-up with Dr Harriet Masson in 1 week (Scheduled 11/24) -See Dr Bobby Rumpf in 2 weeks -[place] order for Nursing and PT made at discharge  Items Reviewed:  Medications reviewed: yes  Allergies reviewed: yes  Dietary changes reviewed: yes  Referrals reviewed: yes   Functional Questionnaire:   Activities of Daily Living (ADLs):   She states that her family is helping her due to being so weak   Any transportation issues/concerns?: no, her son will bring her to appointments   Any patient concerns? No.  She spoke with the home health person that came out to visit her husband and they will  schedule something with her at the same time.  I advised her to let me know if she has any problems with this.   Confirmed importance and date/time of follow-up visits scheduled yes  Provider Appointment booked with Dr Holly Bodily 09/06/20.  Confirmed with patient if condition begins to worsen call PCP or go to the ER.  Patient was given the office number and encouraged to call back with question or concerns.  : yes - Confirmed patient has office after hours numbers as well.   Shelle Iron, LPN  29/56/21 30:86 PM

## 2020-09-01 ENCOUNTER — Encounter: Payer: Self-pay | Admitting: Cardiology

## 2020-09-01 ENCOUNTER — Ambulatory Visit: Payer: Medicare HMO | Admitting: Cardiology

## 2020-09-01 ENCOUNTER — Other Ambulatory Visit: Payer: Self-pay

## 2020-09-01 VITALS — BP 122/60 | HR 79 | Wt 187.4 lb

## 2020-09-01 DIAGNOSIS — E785 Hyperlipidemia, unspecified: Secondary | ICD-10-CM

## 2020-09-01 DIAGNOSIS — I442 Atrioventricular block, complete: Secondary | ICD-10-CM

## 2020-09-01 DIAGNOSIS — I420 Dilated cardiomyopathy: Secondary | ICD-10-CM

## 2020-09-01 DIAGNOSIS — I472 Ventricular tachycardia: Secondary | ICD-10-CM

## 2020-09-01 DIAGNOSIS — I110 Hypertensive heart disease with heart failure: Secondary | ICD-10-CM

## 2020-09-01 DIAGNOSIS — E1169 Type 2 diabetes mellitus with other specified complication: Secondary | ICD-10-CM | POA: Diagnosis not present

## 2020-09-01 DIAGNOSIS — I5022 Chronic systolic (congestive) heart failure: Secondary | ICD-10-CM

## 2020-09-01 DIAGNOSIS — I4821 Permanent atrial fibrillation: Secondary | ICD-10-CM | POA: Diagnosis not present

## 2020-09-01 DIAGNOSIS — I4729 Other ventricular tachycardia: Secondary | ICD-10-CM

## 2020-09-01 MED ORDER — BUMETANIDE 1 MG PO TABS: 1.0000 mg | ORAL_TABLET | Freq: Two times a day (BID) | ORAL | 3 refills | Status: DC

## 2020-09-01 NOTE — Patient Instructions (Signed)
Medication Instructions:   STOP FUROSEMIDE  START BUMEX 1 MG TWICE DAILY  *If you need a refill on your cardiac medications before your next appointment, please call your pharmacy*   Lab Work:  Your physician recommends that you HAVE LAB WORK TODAY  If you have labs (blood work) drawn today and your tests are completely normal, you will receive your results only by: Marland Kitchen MyChart Message (if you have MyChart) OR . A paper copy in the mail If you have any lab test that is abnormal or we need to change your treatment, we will call you to review the results.  Follow-Up: At [facility], you and your health needs are our priority.  As part of our continuing mission to provide you with exceptional heart care, we have created designated Provider Care Teams.  These Care Teams include your primary Cardiologist (physician) and Advanced Practice Providers (APPs -  Physician Assistants and Nurse Practitioners) who all work together to provide you with the care you need, when you need it.  We recommend signing up for the patient portal called "MyChart".  Sign up information is provided on this After Visit Summary.  MyChart is used to connect with patients for Virtual Visits (Telemedicine).  Patients are able to view lab/test results, encounter notes, upcoming appointments, etc.  Non-urgent messages can be sent to your provider as well.   To learn more about what you can do with MyChart, go to [email or web address].    Your next appointment:   2 week(s)  The format for your next appointment:   In Person  Provider:   Berniece Salines, DO

## 2020-09-01 NOTE — Progress Notes (Signed)
Cardiology Office Note:    Date:  09/01/2020   ID:  [name], DOB 1934-02-19, MRN [id number]  PCP:  [name], MD  Cardiologist:  No primary care provider on file.  Electrophysiologist:  None   Referring MD: [name], MD   " I have some swelling"  History of Present Illness:    Sandy Tineo Lambethis a 86 y.o.femalewith a hx of atrial fibrillation status post ablation, status post tri-BiV ICD placed in 2015 due to nonischemic cardiomyopathy, chronic systolic heart failure most recent EF at [facility] in July 2021 showed 30 to 35%, hypertension, hyperlipidemia, history of CVA,peripheral vascular disease, renal artery stenosis, status post polymorphic V. tach which was related to sotalol therapy and amiodarone pulmonary toxicity.  I saw the patient June 07, 2020 at that time she was status post hospitalization for heart failure event of hospital.  During her visit we reviewed her medication she was on guideline directed medical therapy I kept the patient on all of her medicines.  In the interim she did see Dr. Crissie Sickles who is her electrophysiologist.  No changes were made.  At her last visit I started patient I gave the patient some metolazone for 5 days to help optimize her diuresis to avoid hospital visit.  In the interim she tells me she became significant constipated from the metolazone and had a hospitalization for acute exacerbation of heart failure in addition she was noted to have significant anemia.  During that visit her Xarelto was stopped because of her hemoglobin lowered to 7.2.  She is status post 2 units packed red blood cells transfusion while she was in the hospital.  She was discharged on 22 November.  She is here today for follow-up visit.  She tells me that her legs are still swollen and she feels a little better. She has not seen gastroenterologist yet for any consideration for endoscopy and also has not seen hematology.  She is in the  process of making this appointment.   Past Medical History:  Diagnosis Date  . Amiodarone pulmonary toxicity   . Anemia    no GI bleeding; on iron  . Arthritis   . Atrial fibrillation ([place])   . Biventricular ICD (implantable cardiac defibrillator) in place    BiV ICD for nonischemic cardiomyopathy; BiV responder  . Carotid artery occlusion   . CHF (congestive heart failure) ([place])   . Chronic a-fib ([place])    on xarelto; failed DCCV  . Chronic kidney disease, stage 4 (severe) ([place])   . Diabetes ([place])   . Fibromyalgia   . Full dentures   . GAD (generalized anxiety disorder)   . GERD (gastroesophageal reflux disease)   . H/O cardiovascular stress test 08/04/2010   normal imaging; EF 61%  . H/O echocardiogram 07/05/2010   EF 45-50%; aortic valve-mildly sclerotic; LA-mod dilaterd   . Hyperlipemia   . Hypertension   . Hypertensive heart disease with heart failure ([place])   . Hypothalamic disease ([place])    on thyroid supplement  . Hypothyroidism   . Hypothyroidism   . LBBB (left bundle branch block)   . Mixed hyperlipidemia   . Occlusion and stenosis of right carotid artery   . Other persistent atrial fibrillation ([place])   . Pneumonia   . Presence of automatic (implantable) cardiac defibrillator   . PVD (peripheral vascular disease) ([place])    L RA stent 1994  . RAS (renal artery stenosis) ([place])    a. renal dopp (12/18/08)-  Right RA-<60%; L RA stent- open; nl size and shape in both kidneys;  b.  Rena Artery [name] (3/16):  SMA with > 70% stenosis, Bilateral prox RA with 1-59%  . Renal insufficiency, mild    05/01/12 Creat 1.47  . Restless leg syndrome   . Type 2 diabetes mellitus with diabetic neuropathy ([place])   . VT (ventricular tachycardia) ([place]) 9/11   polymorphic VT probably related to sotalol therapy  . Wears glasses     Past Surgical History:  Procedure Laterality Date  . ABDOMINAL HYSTERECTOMY    . APPENDECTOMY    . AV NODE ABLATION  06/08/2006   performed by Dr. Beckie Salts; due to Afib with RVR and tachy brady syndrome  . BIV ICD GENERATOR CHANGEOUT N/A 12/31/2017   Procedure: BIV ICD GENERATOR CHANGEOUT;  Surgeon: Evans Lance, MD;  Location: [facility];  Service: Cardiovascular;  Laterality: N/A;  . BIV ICD GENERTAOR CHANGE OUT  11/15/10   Medtronic Protecta [id number] serial #[id number]  . BiV ICD Placement  06/17/2007; 04/20/14   Medtronic Concerta [id number]; RA lead-Med 5076/53cm, [id number]; RV lead- SJM 7121/65cm, [id number]; LV lead- Med 4194/88cm, [id number]; gen change 04/2014 by Dr Lovena Le  . BIV PACEMAKER GENERATOR CHANGE OUT N/A 04/20/2014   Procedure: BIV PACEMAKER GENERATOR CHANGE OUT;  Surgeon: Evans Lance, MD;  Location: [facility] CATH LAB;  Service: Cardiovascular;  Laterality: N/A;  . BRAIN MENINGIOMA EXCISION  2011   L frontal meningi[facility] at [facility]   . BREAST SURGERY     lumpectomy right  . CARDIOVERSION  07/21/05   converted to sinus brady  . CAROTID ENDARTERECTOMY Right 09/24/2018   with bovine pericardial patch angioplasty  . CATARACT EXTRACTION W/ INTRAOCULAR LENS  IMPLANT, BILATERAL    . CHOLECYSTECTOMY     Dr. Sherald Hess in [place] and Dr. Lyda Jester follows; surgical stricture post procedure with stent placement  . COLONOSCOPY W/ BIOPSIES AND POLYPECTOMY    . DILATION AND CURETTAGE OF UTERUS    . ENDARTERECTOMY Right 09/24/2018   Procedure: ENDARTERECTOMY CAROTID RIGHT;  Surgeon: Angelia Mould, MD;  Location: [facility];  Service: Vascular;  Laterality: Right;  . PV Angio  1994   L Renal artery stent     Current Medications: No outpatient medications have been marked as taking for the 09/01/20 encounter (Office Visit) with Berniece Salines, DO.   Current Facility-Administered Medications for the 09/01/20 encounter (Office Visit) with Berniece Salines, DO  Medication  . triamcinolone acetonide (KENALOG-40) injection 40 mg     Allergies:   Gabapentin, Pregabalin, Morphine, Penicillins, Tape, Ace inhibitors, Carvedilol,  Insulin glargine, Metoprolol, Pantoprazole, Procaine, Statins, and Lisinopril   Social History   Socioeconomic History  . Marital status: Married    Spouse name: Not on file  . Number of children: 6  . Years of education: Not on file  . Highest education level: Not on file  Occupational History  . Not on file  Tobacco Use  . Smoking status: Never Smoker  . Smokeless tobacco: Never Used  Vaping Use  . Vaping Use: Never used  Substance and Sexual Activity  . Alcohol use: No    Alcohol/week: 0.0 standard drinks  . Drug use: No  . Sexual activity: Not Currently  Other Topics Concern  . Not on file  Social History Narrative   wears sunscreen, brushes and flosses daily, see's dentist bi-annually, has smoke/carbon monoxide detectors, wears a seatbelt and practices gun safety   Social Determinants of Health  Financial Resource Strain:   . Difficulty of Paying Living Expenses: Not on file  Food Insecurity: No Food Insecurity  . Worried About Charity fundraiser in the Last Year: Never true  . Ran Out of Food in the Last Year: Never true  Transportation Needs: No Transportation Needs  . Lack of Transportation (Medical): No  . Lack of Transportation (Non-Medical): No  Physical Activity:   . Days of Exercise per Week: Not on file  . Minutes of Exercise per Session: Not on file  Stress:   . Feeling of Stress : Not on file  Social Connections:   . Frequency of Communication with Friends and Family: Not on file  . Frequency of Social Gatherings with Friends and Family: Not on file  . Attends Religious Services: Not on file  . Active Member of Clubs or Organizations: Not on file  . Attends Archivist Meetings: Not on file  . Marital Status: Not on file     Family History: The patient's family history includes AAA (abdominal aortic aneurysm) in her brother; Cancer in her father and sister; Dementia in her sister; Diabetes in an other family member; Heart attack in her  brother; Heart failure in her mother; Hypertension in her mother; Stroke in her maternal grandmother.  ROS:   Review of Systems  Constitution: Negative for decreased appetite, fever and weight gain.  HENT: Negative for congestion, ear discharge, hoarse voice and sore throat.   Eyes: Negative for discharge, redness, vision loss in right eye and visual halos.  Cardiovascular: Negative for chest pain, dyspnea on exertion, leg swelling, orthopnea and palpitations.  Respiratory: Negative for cough, hemoptysis, shortness of breath and snoring.   Endocrine: Negative for heat intolerance and polyphagia.  Hematologic/Lymphatic: Negative for bleeding problem. Does not bruise/bleed easily.  Skin: Negative for flushing, nail changes, rash and suspicious lesions.  Musculoskeletal: Negative for arthritis, joint pain, muscle cramps, myalgias, neck pain and stiffness.  Gastrointestinal: Negative for abdominal pain, bowel incontinence, diarrhea and excessive appetite.  Genitourinary: Negative for decreased libido, genital sores and incomplete emptying.  Neurological: Negative for brief paralysis, focal weakness, headaches and loss of balance.  Psychiatric/Behavioral: Negative for altered mental status, depression and suicidal ideas.  Allergic/Immunologic: Negative for HIV exposure and persistent infections.    EKGs/Labs/Other Studies Reviewed:    The following studies were reviewed today:   EKG: None today  Recent Labs: 05/25/2020: TSH 2.440 08/12/2020: Magnesium 2.9 08/26/2020: ALT 18; BUN 63; Creatinine, Ser 2.53; Hemoglobin 6.6; NT-Pro BNP 8,481; Platelets 477; Potassium 5.0; Sodium 133  Recent Lipid Panel    Component Value Date/Time   CHOL 99 (L) 08/26/2020 1328   TRIG 78 08/26/2020 1328   HDL 31 (L) 08/26/2020 1328   CHOLHDL 3.2 08/26/2020 1328   LDLCALC 52 08/26/2020 1328    Physical Exam:    VS:  BP 122/60   Pulse 79   Wt 187 lb 6.4 oz (85 kg)   SpO2 99%   BMI 28.49 kg/m     Wt  Readings from Last 3 Encounters:  09/01/20 187 lb 6.4 oz (85 kg)  08/26/20 185 lb 6.4 oz (84.1 kg)  08/12/20 182 lb 12.8 oz (82.9 kg)     GEN: Well nourished, well developed in no acute distress HEENT: Normal NECK: No JVD; No carotid bruits LYMPHATICS: No lymphadenopathy CARDIAC: S1S2 noted,RRR, no murmurs, rubs, gallops RESPIRATORY:  Clear to auscultation without rales, wheezing or rhonchi  ABDOMEN: Soft, non-tender, non-distended, +bowel sounds, no guarding. EXTREMITIES: +  2 bilateral edema, No cyanosis, no clubbing MUSCULOSKELETAL:  No deformity  SKIN: Warm and dry NEUROLOGIC:  Alert and oriented x 3, non-focal PSYCHIATRIC:  Normal affect, good insight  ASSESSMENT:    1. Dilated cardiomyopathy ([place])   2. Permanent atrial fibrillation ([place])   3. Chronic systolic heart failure ([place])   4. Complete atrioventricular block (HCC)   5. NSVT (nonsustained ventricular tachycardia) (HCC)   6. Hypertensive heart disease with heart failure ([place])   7. Dyslipidemia associated with type 2 diabetes mellitus ([place])    PLAN:    She still off the Xarelto the initial plan in the hospital was to keep the patient off the Xarelto for about a week and repeat to her hemoglobin.  I will get the hemoglobin done today.  I have encouraged the patient that she needs to see hematology as well as GI.  She does need her anticoagulation for her paroxysmal atrial fibrillation and she also has had prior pulmonary embolism.  In terms of her dilated cardiomyopathy and systolic heart failure she is volume loaded today.  We will stop her Lasix and start the patient on Bumex and see if this is going to work better.  She will be on Bumex 1 mg twice daily.  We will get blood work today for BMP and CBC. She is going to be getting home health aide which will include physical therapy this has been approved prior.  The patient is in agreement with the above plan. The patient left the office in stable condition.  The patient  will follow up in 2 weeks.   Medication Adjustments/Labs and Tests Ordered: Current medicines are reviewed at length with the patient today.  Concerns regarding medicines are outlined above.  Orders Placed This Encounter  Procedures  . Basic metabolic panel  . Magnesium  . CBC   Meds ordered this encounter  Medications  . bumetanide (BUMEX) 1 MG tablet    Sig: Take 1 tablet (1 mg total) by mouth 2 (two) times daily.    Dispense:  180 tablet    Refill:  3    Patient Instructions  Medication Instructions:   STOP FUROSEMIDE  START BUMEX 1 MG TWICE DAILY  *If you need a refill on your cardiac medications before your next appointment, please call your pharmacy*   Lab Work:  Your physician recommends that you HAVE LAB WORK TODAY  If you have labs (blood work) drawn today and your tests are completely normal, you will receive your results only by: Marland Kitchen MyChart Message (if you have MyChart) OR . A paper copy in the mail If you have any lab test that is abnormal or we need to change your treatment, we will call you to review the results.  Follow-Up: At [facility], you and your health needs are our priority.  As part of our continuing mission to provide you with exceptional heart care, we have created designated Provider Care Teams.  These Care Teams include your primary Cardiologist (physician) and Advanced Practice Providers (APPs -  Physician Assistants and Nurse Practitioners) who all work together to provide you with the care you need, when you need it.  We recommend signing up for the patient portal called "MyChart".  Sign up information is provided on this After Visit Summary.  MyChart is used to connect with patients for Virtual Visits (Telemedicine).  Patients are able to view lab/test results, encounter notes, upcoming appointments, etc.  Non-urgent messages can be sent to your provider as well.  To learn more about what you can do with MyChart, go to  [email or web address].    Your next appointment:   2 week(s)  The format for your next appointment:   In Person  Provider:   Berniece Salines, DO       Adopting a Healthy Lifestyle.  Know what a healthy weight is for you (roughly BMI <25) and aim to maintain this   Aim for 7+ servings of fruits and vegetables daily   65-80+ fluid ounces of water or unsweet tea for healthy kidneys   Limit to max 1 drink of alcohol per day; avoid smoking/tobacco   Limit animal fats in diet for cholesterol and heart health - choose grass fed whenever available   Avoid highly processed foods, and foods high in saturated/trans fats   Aim for low stress - take time to unwind and care for your mental health   Aim for 150 min of moderate intensity exercise weekly for heart health, and weights twice weekly for bone health   Aim for 7-9 hours of sleep daily   When it comes to diets, agreement about the perfect plan isnt easy to find, even among the experts. Experts at the [employer] developed an idea known as the Healthy Eating Plate. Just imagine a plate divided into logical, healthy portions.   The emphasis is on diet quality:   Load up on vegetables and fruits - one-half of your plate: Aim for color and variety, and remember that potatoes dont count.   Go for whole grains - one-quarter of your plate: Whole wheat, barley, wheat berries, quinoa, oats, brown rice, and foods made with them. If you want pasta, go with whole wheat pasta.   Protein power - one-quarter of your plate: Fish, chicken, beans, and nuts are all healthy, versatile protein sources. Limit red meat.   The diet, however, does go beyond the plate, offering a few other suggestions.   Use healthy plant oils, such as olive, canola, soy, corn, sunflower and peanut. Check the labels, and avoid partially hydrogenated oil, which have unhealthy trans fats.   If youre thirsty, drink water. Coffee and tea are good  in moderation, but skip sugary drinks and limit milk and dairy products to one or two daily servings.   The type of carbohydrate in the diet is more important than the amount. Some sources of carbohydrates, such as vegetables, fruits, whole grains, and beans-are healthier than others.   Finally, stay active  Signed, Berniece Salines, DO  09/01/2020 12:19 PM    [facility]

## 2020-09-02 LAB — BASIC METABOLIC PANEL
BUN/Creatinine Ratio: 26 (ref 12–28)
BUN: 45 mg/dL — ABNORMAL HIGH (ref 8–27)
CO2: 23 mmol/L (ref 20–29)
Calcium: 9.4 mg/dL (ref 8.7–10.3)
Chloride: 101 mmol/L (ref 96–106)
Creatinine, Ser: 1.75 mg/dL — ABNORMAL HIGH (ref 0.57–1.00)
GFR calc Af Amer: 31 mL/min/{1.73_m2} — ABNORMAL LOW (ref >59)
GFR calc non Af Amer: 27 mL/min/{1.73_m2} — ABNORMAL LOW (ref >59)
Glucose: 107 mg/dL — ABNORMAL HIGH (ref 65–99)
Potassium: 4.7 mmol/L (ref 3.5–5.2)
Sodium: 138 mmol/L (ref 134–144)

## 2020-09-02 LAB — CBC
Hematocrit: 28.4 % — ABNORMAL LOW (ref 34.0–46.6)
Hemoglobin: 9 g/dL — ABNORMAL LOW (ref 11.1–15.9)
MCH: 27.4 pg (ref 26.6–33.0)
MCHC: 31.7 g/dL (ref 31.5–35.7)
MCV: 86 fL (ref 79–97)
Platelets: 434 10*3/uL (ref 150–450)
RBC: 3.29 x10E6/uL — ABNORMAL LOW (ref 3.77–5.28)
RDW: 14.7 % (ref 11.7–15.4)
WBC: 6.5 10*3/uL (ref 3.4–10.8)

## 2020-09-02 LAB — MAGNESIUM: Magnesium: 2 mg/dL (ref 1.6–2.3)

## 2020-09-06 ENCOUNTER — Other Ambulatory Visit: Payer: Self-pay

## 2020-09-06 ENCOUNTER — Ambulatory Visit (INDEPENDENT_AMBULATORY_CARE_PROVIDER_SITE_OTHER): Payer: Medicare HMO | Admitting: Family Medicine

## 2020-09-06 ENCOUNTER — Telehealth: Payer: Self-pay

## 2020-09-06 ENCOUNTER — Encounter: Payer: Self-pay | Admitting: Family Medicine

## 2020-09-06 VITALS — BP 132/60 | HR 77 | Resp 18 | Ht 68.0 in | Wt 177.8 lb

## 2020-09-06 DIAGNOSIS — K2901 Acute gastritis with bleeding: Secondary | ICD-10-CM | POA: Insufficient documentation

## 2020-09-06 DIAGNOSIS — E1140 Type 2 diabetes mellitus with diabetic neuropathy, unspecified: Secondary | ICD-10-CM

## 2020-09-06 DIAGNOSIS — I509 Heart failure, unspecified: Secondary | ICD-10-CM | POA: Diagnosis not present

## 2020-09-06 DIAGNOSIS — E782 Mixed hyperlipidemia: Secondary | ICD-10-CM

## 2020-09-06 DIAGNOSIS — N184 Chronic kidney disease, stage 4 (severe): Secondary | ICD-10-CM

## 2020-09-06 DIAGNOSIS — I482 Chronic atrial fibrillation, unspecified: Secondary | ICD-10-CM

## 2020-09-06 DIAGNOSIS — D509 Iron deficiency anemia, unspecified: Secondary | ICD-10-CM

## 2020-09-06 HISTORY — DX: Iron deficiency anemia, unspecified: D50.9

## 2020-09-06 HISTORY — DX: Acute gastritis with bleeding: K29.01

## 2020-09-06 NOTE — Telephone Encounter (Signed)
-----   Message from Berniece Salines, DO sent at 09/06/2020  8:30 AM EST ----- Creatinine at baseline, thankfully her hemoglobin has improved some.  Please check with patient to see if she has scheduled GI appointment for follow-up.

## 2020-09-06 NOTE — Progress Notes (Signed)
Established Patient Office Visit  Subjective:  Patient ID: [name], female    DOB: 12/08/1933  Age: 86 y.o. MRN: [id number]  CC:  Chief Complaint  Patient Hunt with  . Hospitalization Follow-up  COVID -Pfizer vaccine x 2 -unsure on booster  HPI [name] for transition of care w/i 1 week-7 day-09/06/20 Pt states she was hospitalized :08/27/20-08/30/20 Diagnosis-acute on chronic iron deficiency anemia Non bleeding gastric ulcer in the antrum Heme-positive stool Acute on chronic CHF with EF 30-35% Acute kidney injury on CKD stage 3  Surgical Pathology Report: gastric antral mucosa with mild chronic inflammation. Negative for H Pylori No intestinal metaplasma, dysplasia or carcinoma  Cardiology evaluation 09/01/20-labwork WBC 6.5 H/H 9.0/28.4 Cr 1.75, GFR 27-dilated cardiomyopathy-stopped Lasix, started Bumex-f/u in 2 weeks. Restarted Xarelto  Pt able to bath herself-walking with a walker Family and neighbors helping family with food since hospitalized  Pt  [place] ordered by the hospital-pt states she received a denial letter that she did not qualify for home health and was worried about recovery of strength. Pt lives with husband who is blind and a son in a wheelchair-she normally cares for both.  Pt with iron deficiency in the past-infusion with Dr. Bobby Rumpf. Pt states she has not had an infusion in over a year. Pt has a difficulty time tolerating iron more than one /day Past Medical History:  Diagnosis Date  . Amiodarone pulmonary toxicity   . Anemia    no GI bleeding; on iron  . Arthritis   . Atrial fibrillation ([place])   . Biventricular ICD (implantable cardiac defibrillator) in place    BiV ICD for nonischemic cardiomyopathy; BiV responder  . Carotid artery occlusion   . CHF (congestive heart failure) ([place])   . Chronic a-fib ([place])    on xarelto; failed DCCV  . Chronic kidney disease, stage 4 (severe) ([place])   .  Diabetes ([place])   . Fibromyalgia   . Full dentures   . GAD (generalized anxiety disorder)   . GERD (gastroesophageal reflux disease)   . H/O cardiovascular stress test 08/04/2010   normal imaging; EF 61%  . H/O echocardiogram 07/05/2010   EF 45-50%; aortic valve-mildly sclerotic; LA-mod dilaterd   . Hyperlipemia   . Hypertension   . Hypertensive heart disease with heart failure ([place])   . Hypothalamic disease ([place])    on thyroid supplement  . Hypothyroidism   . Hypothyroidism   . LBBB (left bundle branch block)   . Mixed hyperlipidemia   . Occlusion and stenosis of right carotid artery   . Other persistent atrial fibrillation ([place])   . Pneumonia   . Presence of automatic (implantable) cardiac defibrillator   . PVD (peripheral vascular disease) ([place])    L RA stent 1994  . RAS (renal artery stenosis) ([place])    a. renal dopp (12/18/08)- Right RA-<60%; L RA stent- open; nl size and shape in both kidneys;  b.  Rena Artery [name] (3/16):  SMA with > 70% stenosis, Bilateral prox RA with 1-59%  . Renal insufficiency, mild    05/01/12 Creat 1.47  . Restless leg syndrome   . Type 2 diabetes mellitus with diabetic neuropathy ([place])   . VT (ventricular tachycardia) ([place]) 9/11   polymorphic VT probably related to sotalol therapy  . Wears glasses     Past Surgical History:  Procedure Laterality Date  . ABDOMINAL HYSTERECTOMY    . APPENDECTOMY    . AV NODE ABLATION  06/08/2006   performed by Dr. Beckie Salts; due to Afib with RVR and tachy brady syndrome  . BIV ICD GENERATOR CHANGEOUT N/A 12/31/2017   Procedure: BIV ICD GENERATOR CHANGEOUT;  Surgeon: Evans Lance, MD;  Location: [facility];  Service: Cardiovascular;  Laterality: N/A;  . BIV ICD GENERTAOR CHANGE OUT  11/15/10   Medtronic Protecta [id number] serial #[id number]  . BiV ICD Placement  06/17/2007; 04/20/14   Medtronic Concerta [id number]; RA lead-Med 5076/53cm, [id number]; RV lead- SJM 7121/65cm, [id number]; LV lead- Med 4194/88cm,  [id number]; gen change 04/2014 by Dr Lovena Le  . BIV PACEMAKER GENERATOR CHANGE OUT N/A 04/20/2014   Procedure: BIV PACEMAKER GENERATOR CHANGE OUT;  Surgeon: Evans Lance, MD;  Location: [facility];  Service: Cardiovascular;  Laterality: N/A;  . BRAIN MENINGIOMA EXCISION  2011   L frontal meningioma at [facility]   . BREAST SURGERY     lumpectomy right  . CARDIOVERSION  07/21/05   converted to sinus brady  . CAROTID ENDARTERECTOMY Right 09/24/2018   with bovine pericardial patch angioplasty  . CATARACT EXTRACTION W/ INTRAOCULAR LENS  IMPLANT, BILATERAL    . CHOLECYSTECTOMY     Dr. Sherald Hess in [place] and Dr. Lyda Jester follows; surgical stricture post procedure with stent placement  . COLONOSCOPY W/ BIOPSIES AND POLYPECTOMY    . DILATION AND CURETTAGE OF UTERUS    . ENDARTERECTOMY Right 09/24/2018   Procedure: ENDARTERECTOMY CAROTID RIGHT;  Surgeon: Angelia Mould, MD;  Location: [facility] [place];  Service: Vascular;  Laterality: Right;  . PV Angio  1994   L Renal artery stent     Family History  Problem Relation Age of Onset  . Heart failure Mother   . Hypertension Mother   . Cancer Father        lung  . Dementia Sister   . Cancer Sister        pancreatic, renal and thyroid  . Heart attack Brother   . AAA (abdominal aortic aneurysm) Brother   . Stroke Maternal Grandmother   . Diabetes Other     Social History   Socioeconomic History  . Marital status: Married    Spouse name: Not on file  . Number of children: 6  . Years of education: Not on file  . Highest education level: Not on file  Occupational History  . Not on file  Tobacco Use  . Smoking status: Never Smoker  . Smokeless tobacco: Never Used  Vaping Use  . Vaping Use: Never used  Substance and Sexual Activity  . Alcohol use: No    Alcohol/week: 0.0 standard drinks  . Drug use: No  . Sexual activity: Not Currently  Other Topics Concern  . Not on file  Social History Narrative   wears sunscreen, brushes  and flosses daily, see's dentist bi-annually, has smoke/carbon monoxide detectors, wears a seatbelt and practices gun safety   Social Determinants of Health   Financial Resource Strain:   . Difficulty of Paying Living Expenses: Not on file  Food Insecurity: No Food Insecurity  . Worried About Charity fundraiser in the Last Year: Never true  . Ran Out of Food in the Last Year: Never true  Transportation Needs: No Transportation Needs  . Lack of Transportation (Medical): No  . Lack of Transportation (Non-Medical): No  Physical Activity:   . Days of Exercise per Week: Not on file  . Minutes of Exercise per Session: Not on file  Stress:   . Feeling of Stress :  Not on file  Social Connections:   . Frequency of Communication with Friends and Family: Not on file  . Frequency of Social Gatherings with Friends and Family: Not on file  . Attends Religious Services: Not on file  . Active Member of Clubs or Organizations: Not on file  . Attends Archivist Meetings: Not on file  . Marital Status: Not on file  Intimate Partner Violence:   . Fear of Current or Ex-Partner: Not on file  . Emotionally Abused: Not on file  . Physically Abused: Not on file  . Sexually Abused: Not on file    Outpatient Medications Prior to Visit  Medication Sig Dispense Refill  . Accu-Chek FastClix Lancets MISC TEST BLOOD SUGAR TWICE DAILY E11.69 204 each 3  . Alcohol Swabs (B-D SINGLE USE SWABS REGULAR) PADS USE TWICE DAILY  E11.69 200 each 3  . ALPRAZolam (XANAX) 0.5 MG tablet TAKE ONE TABLET BY MOUTH EVERY NIGHT AT BEDTIME FOR INSOMNIA 90 tablet 0  . amLODipine (NORVASC) 5 MG tablet Take 5 mg by mouth.    . Apple Cider Vinegar 188 MG CAPS Take 2 capsules by mouth daily.    . bumetanide (BUMEX) 1 MG tablet Take 1 tablet (1 mg total) by mouth 2 (two) times daily. 180 tablet 3  . calcium carbonate (OSCAL) 1500 (600 Ca) MG TABS tablet Take by mouth.    . digoxin (LANOXIN) 0.125 MG tablet TAKE 1/2 TABLET  EVERY DAY (SUBSTITUTED FOR  DIGITEK) 45 tablet 1  . DROPLET PEN NEEDLES 31G X 5 MM MISC USE AS DIRECTED WITH LEVEMIR 300 each 3  . fenofibrate micronized (LOFIBRA) 134 MG capsule Take 1 capsule (134 mg total) by mouth daily. 90 capsule 1  . fluticasone (FLONASE) 50 MCG/ACT nasal spray USE 2 SPRAYS IN EACH NOSTRIL EVERY DAY AS NEEDED 48 g 3  . glucose blood (ACCU-CHEK SMARTVIEW) test strip CHECK BLOOD SUGAR TWICE DAILY 200 strip 3  . LEVEMIR FLEXTOUCH 100 UNIT/ML FlexPen INJECT 42 UNITS SUBCUTANEOUSLY ONE TIME DAILY (Patient taking differently: 5 Units. ) 45 mL 1  . levothyroxine (SYNTHROID) 50 MCG tablet TAKE 1 TABLET EVERY DAY 90 tablet 1  . losartan (COZAAR) 100 MG tablet TAKE 1 TABLET EVERY DAY 90 tablet 1  . magnesium oxide (MAG-OX) 400 MG tablet Take 400 mg by mouth 2 (two) times daily.    . Multiple Vitamin (MULTIVITAMIN WITH MINERALS) TABS tablet Take 1 tablet by mouth daily. Centrum Silver    . nystatin cream (MYCOSTATIN)     . olopatadine (PATADAY) 0.1 % ophthalmic solution Place 1 drop into both eyes daily.    Marland Kitchen omega-3 acid ethyl esters (LOVAZA) 1 g capsule TAKE 1 CAPSULE TWICE DAILY 180 capsule 1  . OVER THE COUNTER MEDICATION Take 2 tablets by mouth daily. Takes Beet Root tablets - 2 tablets daily     . oxyCODONE-acetaminophen (PERCOCET) 7.5-325 MG tablet Take 1 tablet by mouth every 6 (six) hours as needed (back pain.). 120 tablet 0  . polyethylene glycol (MIRALAX / GLYCOLAX) 17 g packet Take 17 g by mouth daily as needed.     . pravastatin (PRAVACHOL) 10 MG tablet TAKE 1 TABLET FOUR TIMES WEEKLY 48 tablet 1  . rOPINIRole (REQUIP) 1 MG tablet TAKE 1 TABLET AT BEDTIME 90 tablet 2  . spironolactone (ALDACTONE) 25 MG tablet Take 0.5 tablets (12.5 mg total) by mouth daily. 45 tablet 1  . tiZANidine (ZANAFLEX) 4 MG tablet TAKE 1 TABLET AT BEDTIME 90 tablet 0  Facility-Administered Medications Prior to Visit  Medication Dose Route Frequency Provider Last Rate Last Admin  .  triamcinolone acetonide (KENALOG-40) injection 40 mg  40 mg Intra-articular Once [name], MD        Allergies  Allergen Reactions  . Gabapentin Swelling  . Pregabalin Swelling       . Morphine Nausea Only  . Penicillins Rash and Other (See Comments)    Has patient had a PCN reaction causing immediate rash, facial/tongue/throat swelling, SOB or lightheadedness with hypotension: No Has patient had a PCN reaction causing severe rash involving mucus membranes or skin necrosis: No Has patient had a PCN reaction that required hospitalization: No - MD office Has patient had a PCN reaction occurring within the last 10 years: No If all of the above answers are "NO", then may proceed with Cephalosporin use.   . Tape Rash  . Ace Inhibitors Other (See Comments)    Angioedema (ALLERGY/intolerance)  . Carvedilol Other (See Comments)    Myalgias (intolerance)  . Insulin Glargine Itching  . Metoprolol Other (See Comments)    Angioedema (ALLERGY/intolerance)  . Pantoprazole Nausea Only  . Procaine Other (See Comments)    unknown  . Statins Other (See Comments)    Myalgias (intolerance)  . Lisinopril Rash         ROS Review of Systems  Constitutional: Positive for fatigue. Negative for fever.  Respiratory: Negative.   Gastrointestinal:       Stool now looks normal-no longer black No reflux with eating No n/v/d  Musculoskeletal: Positive for arthralgias.  Skin: Negative for rash.  Hematological: Does not bruise/bleed easily.      Objective:    Physical Exam Constitutional:      General: She is not in acute distress.    Appearance: She is not ill-appearing.  Cardiovascular:     Rate and Rhythm: Normal rate and regular rhythm.     Pulses: Normal pulses.     Heart sounds: Normal heart sounds.  Musculoskeletal:     Cervical back: Normal range of motion and neck supple.     Right lower leg: Edema present.     Left lower leg: Edema present.     Comments: LE bilat    Psychiatric:        Mood and Affect: Mood normal.        Behavior: Behavior normal.    Weight 182 at the hospital, 185 11/18 at [facility] BP 132/60   Pulse 77   Resp 18   Ht 5\' 8"  (1.727 m)   Wt 177 lb 12.8 oz (80.6 kg)   SpO2 97%   BMI 27.03 kg/m  Wt Readings from Last 3 Encounters:  09/06/20 177 lb 12.8 oz (80.6 kg)  09/01/20 187 lb 6.4 oz (85 kg)  08/26/20 185 lb 6.4 oz (84.1 kg)   Health Maintenance Due  Topic Date Due  . FOOT EXAM  Never done  . OPHTHALMOLOGY EXAM  Never done  . COVID-19 Vaccine (1) Never done  . INFLUENZA VACCINE  05/09/2020    Lab Results  Component Value Date   TSH 2.440 05/25/2020   Lab Results  Component Value Date   WBC 6.5 09/01/2020   HGB 9.0 (L) 09/01/2020   HCT 28.4 (L) 09/01/2020   MCV 86 09/01/2020   PLT 434 09/01/2020   Lab Results  Component Value Date   NA 138 09/01/2020   K 4.7 09/01/2020   CO2 23 09/01/2020   GLUCOSE 107 (H)  09/01/2020   BUN 45 (H) 09/01/2020   CREATININE 1.75 (H) 09/01/2020   BILITOT 0.3 08/26/2020   ALKPHOS 35 (L) 08/26/2020   AST 23 08/26/2020   ALT 18 08/26/2020   PROT 5.4 (L) 08/26/2020   ALBUMIN 3.1 (L) 08/26/2020   CALCIUM 9.4 09/01/2020   ANIONGAP 11 09/25/2018   GFR 38.32 (L) 11/30/2014   Lab Results  Component Value Date   CHOL 99 (L) 08/26/2020   Lab Results  Component Value Date   HDL 31 (L) 08/26/2020   Lab Results  Component Value Date   LDLCALC 52 08/26/2020   Lab Results  Component Value Date   TRIG 78 08/26/2020   Lab Results  Component Value Date   CHOLHDL 3.2 08/26/2020   Lab Results  Component Value Date   HGBA1C 5.7 (H) 08/26/2020      Assessment & Plan:  1. Acute gastritis with hemorrhage, unspecified gastritis type Hemoglobin has improved with medication and transfusion-pt needs appointment with Dr. Bobby Rumpf and states she would rather have infusions than iron oral. Pt with no gastro in the past-evaluated while hospitalized - Ambulatory referral  to Gastroenterology  2. Mixed hyperlipidemia pravastatin  3. Chronic kidney disease, stage IV (severe) (HCC) Currently on bumex-will recheck renal function next week  4. Congestive heart failure, unspecified HF chronicity, unspecified heart failure type ([place]) New medication started by cardiology-bumex-2+ pitting edema ankles-cardio f/u in 2 weeks, [facility] in 1 week to recheck. Daily weights suggested  5. Chronic atrial fibrillation (HCC) Cardio placed pt back on Xarelto  6. Type 2 diabetes mellitus with diabetic neuropathy, unspecified whether long term insulin use (HCC)-pt to take glucose readings with ongoing difficulty taking oral intake due to fatigue  Follow-up: 1 week -recheck [facility], keep f/u with cardio in 2 weeks  Dr. Lewis-hematology-stat referral as pt had not appt time GI referral for ongoing evaluation of ulcer disease and gastritis   Lyberti Thrush Hannah Beat, MD

## 2020-09-06 NOTE — Telephone Encounter (Signed)
Left message on patients voicemail to please return our call.   

## 2020-09-06 NOTE — Patient Instructions (Signed)
Dr Bobby Rumpf referral GI referral

## 2020-09-07 ENCOUNTER — Telehealth: Payer: Self-pay

## 2020-09-07 NOTE — Telephone Encounter (Signed)
[name] called and she is scheduled to be seen tomorrow with Home Health.

## 2020-09-07 NOTE — Telephone Encounter (Signed)
Spoke with patient regarding results and recommendation.  Patient verbalizes understanding and is agreeable to plan of care. Advised patient to call back with any issues or concerns.   Patient states that she was told by Dr. Tobie Poet yesterday that she would get the appointment with GI made for her and will give her a call with this appointment. She states that if they do not call her soon that she will call them back.

## 2020-09-07 NOTE — Telephone Encounter (Signed)
-----   Message from Berniece Salines, DO sent at 09/06/2020  8:30 AM EST ----- Creatinine at baseline, thankfully her hemoglobin has improved some.  Please check with patient to see if she has scheduled GI appointment for follow-up.

## 2020-09-08 DIAGNOSIS — I428 Other cardiomyopathies: Secondary | ICD-10-CM | POA: Diagnosis not present

## 2020-09-08 DIAGNOSIS — K256 Chronic or unspecified gastric ulcer with both hemorrhage and perforation: Secondary | ICD-10-CM | POA: Diagnosis not present

## 2020-09-08 DIAGNOSIS — N183 Chronic kidney disease, stage 3 unspecified: Secondary | ICD-10-CM | POA: Diagnosis not present

## 2020-09-08 DIAGNOSIS — E1122 Type 2 diabetes mellitus with diabetic chronic kidney disease: Secondary | ICD-10-CM | POA: Diagnosis not present

## 2020-09-08 DIAGNOSIS — I129 Hypertensive chronic kidney disease with stage 1 through stage 4 chronic kidney disease, or unspecified chronic kidney disease: Secondary | ICD-10-CM | POA: Diagnosis not present

## 2020-09-08 DIAGNOSIS — I480 Paroxysmal atrial fibrillation: Secondary | ICD-10-CM | POA: Diagnosis not present

## 2020-09-08 DIAGNOSIS — D509 Iron deficiency anemia, unspecified: Secondary | ICD-10-CM | POA: Diagnosis not present

## 2020-09-08 DIAGNOSIS — I5022 Chronic systolic (congestive) heart failure: Secondary | ICD-10-CM | POA: Diagnosis not present

## 2020-09-08 DIAGNOSIS — E7800 Pure hypercholesterolemia, unspecified: Secondary | ICD-10-CM | POA: Diagnosis not present

## 2020-09-10 DIAGNOSIS — D509 Iron deficiency anemia, unspecified: Secondary | ICD-10-CM | POA: Diagnosis not present

## 2020-09-10 DIAGNOSIS — N183 Chronic kidney disease, stage 3 unspecified: Secondary | ICD-10-CM | POA: Diagnosis not present

## 2020-09-10 DIAGNOSIS — E1122 Type 2 diabetes mellitus with diabetic chronic kidney disease: Secondary | ICD-10-CM | POA: Diagnosis not present

## 2020-09-10 DIAGNOSIS — I428 Other cardiomyopathies: Secondary | ICD-10-CM | POA: Diagnosis not present

## 2020-09-10 DIAGNOSIS — E7800 Pure hypercholesterolemia, unspecified: Secondary | ICD-10-CM | POA: Diagnosis not present

## 2020-09-10 DIAGNOSIS — I129 Hypertensive chronic kidney disease with stage 1 through stage 4 chronic kidney disease, or unspecified chronic kidney disease: Secondary | ICD-10-CM | POA: Diagnosis not present

## 2020-09-10 DIAGNOSIS — K256 Chronic or unspecified gastric ulcer with both hemorrhage and perforation: Secondary | ICD-10-CM | POA: Diagnosis not present

## 2020-09-10 DIAGNOSIS — I5022 Chronic systolic (congestive) heart failure: Secondary | ICD-10-CM | POA: Diagnosis not present

## 2020-09-10 DIAGNOSIS — I480 Paroxysmal atrial fibrillation: Secondary | ICD-10-CM | POA: Diagnosis not present

## 2020-09-13 ENCOUNTER — Encounter: Payer: Self-pay | Admitting: Family Medicine

## 2020-09-13 ENCOUNTER — Ambulatory Visit (INDEPENDENT_AMBULATORY_CARE_PROVIDER_SITE_OTHER): Payer: Medicare HMO | Admitting: Family Medicine

## 2020-09-13 ENCOUNTER — Other Ambulatory Visit: Payer: Self-pay

## 2020-09-13 VITALS — BP 138/58 | HR 88 | Resp 20 | Ht 68.0 in | Wt 177.0 lb

## 2020-09-13 DIAGNOSIS — K2901 Acute gastritis with bleeding: Secondary | ICD-10-CM | POA: Diagnosis not present

## 2020-09-13 DIAGNOSIS — D509 Iron deficiency anemia, unspecified: Secondary | ICD-10-CM

## 2020-09-13 DIAGNOSIS — N184 Chronic kidney disease, stage 4 (severe): Secondary | ICD-10-CM | POA: Diagnosis not present

## 2020-09-13 DIAGNOSIS — E1140 Type 2 diabetes mellitus with diabetic neuropathy, unspecified: Secondary | ICD-10-CM | POA: Diagnosis not present

## 2020-09-13 NOTE — Patient Instructions (Addendum)
Decrease insulin to 2units at night and stop if glucose less than 120.

## 2020-09-13 NOTE — Progress Notes (Signed)
Established Patient Office Visit  Subjective:  Patient ID: [name], female    DOB: [birth year]  Age: 86 y.o. MRN: [id number]  CC:  Chief Complaint  Patient presents with  . Follow-up  son going into Hospice today-CVA. Pt with recent discharge fromhospital HPI [name] presents for discharge from hospital. Pt has not seen Sandy. Bobby Hunt for follow up-she has not gotten an iron infusion Pt has not seen gastroenterology for additional evaluation-post gastritis with ulcer disease Pt has seen Sandy [name] for evaluation Cardio plan: She still off the Xarelto the initial plan in the hospital was to keep the patient off the Xarelto for about a week and repeat to her hemoglobin-9.0.   I have encouraged the patient that she needs to see hematology as well as GI.  She does need her anticoagulation for her paroxysmal atrial fibrillation and she also has had prior pulmonary embolism. Xarelto restarted  In terms of her dilated cardiomyopathy and systolic heart failure she is volume loaded today.  We will stop her Lasix and start the patient on Bumex and see if this is going to work better.  She will be on Bumex 1 mg twice daily.  HHC ordered previously for evaluation -PT/nursing [name] work/aide  Past Medical History:  Diagnosis Date  . Amiodarone pulmonary toxicity   . Anemia    no GI bleeding; on iron  . Arthritis   . Atrial fibrillation ([place])   . Biventricular ICD (implantable cardiac defibrillator) in place    BiV ICD for nonischemic cardiomyopathy; BiV responder  . Carotid artery occlusion   . CHF (congestive heart failure) ([place])   . Chronic a-fib ([place])    on xarelto; failed DCCV  . Chronic kidney disease, stage 4 (severe) ([place])   . Diabetes ([place])   . Fibromyalgia   . Full dentures   . GAD (generalized anxiety disorder)   . GERD (gastroesophageal reflux disease)   . H/O cardiovascular stress test 08/04/2010   normal imaging; EF 61%  . H/O  echocardiogram 07/05/2010   EF 45-50%; aortic valve-mildly sclerotic; LA-mod dilaterd   . Hyperlipemia   . Hypertension   . Hypertensive heart disease with heart failure ([place])   . Hypothalamic disease ([place])    on thyroid supplement  . Hypothyroidism   . Hypothyroidism   . LBBB (left bundle branch block)   . Mixed hyperlipidemia   . Occlusion and stenosis of right carotid artery   . Other persistent atrial fibrillation ([place])   . Pneumonia   . Presence of automatic (implantable) cardiac defibrillator   . PVD (peripheral vascular disease) ([place])    L RA stent 1994  . RAS (renal artery stenosis) ([place])    a. renal dopp (12/18/08)- Right RA-<60%; L RA stent- open; nl size and shape in both kidneys;  b.  Rena Artery [name] (3/16):  SMA with > 70% stenosis, Bilateral prox RA with 1-59%  . Renal insufficiency, mild    05/01/12 Creat 1.47  . Restless leg syndrome   . Type 2 diabetes mellitus with diabetic neuropathy ([place])   . VT (ventricular tachycardia) ([place]) 9/11   polymorphic VT probably related to sotalol therapy  . Wears glasses     Past Surgical History:  Procedure Laterality Date  . ABDOMINAL HYSTERECTOMY    . APPENDECTOMY    . AV NODE ABLATION  06/08/2006   performed by Sandy. Beckie Hunt; due to Afib with RVR and tachy brady syndrome  . BIV ICD  GENERATOR CHANGEOUT N/A 12/31/2017   Procedure: BIV ICD GENERATOR CHANGEOUT;  Surgeon: Sandy Lance, MD;  Location: [facility];  Service: Cardiovascular;  Laterality: N/A;  . BIV ICD GENERTAOR CHANGE OUT  11/15/10   Medtronic Protecta [id number] serial #[id number]  . BiV ICD Placement  06/17/2007; 04/20/14   Medtronic Concerta [id number]; RA lead-Med 5076/53cm, [id number]; RV lead- SJM 7121/65cm, [id number]; LV lead- Med 4194/88cm, [id number]; gen change 04/2014 by [name]  . BIV PACEMAKER GENERATOR CHANGE OUT N/A 04/20/2014   Procedure: BIV PACEMAKER GENERATOR CHANGE OUT;  Surgeon: Sandy Lance, MD;  Location: [facility] C[facility];  Service:  Cardiovascular;  Laterality: N/A;  . BRAIN MENINGIOMA EXCISION  2011   L frontal [facility] at [facility]   . BREAST SURGERY     lumpectomy right  . CARDIOVERSION  07/21/05   converted to sinus brady  . CAROTID ENDARTERECTOMY Right 09/24/2018   with bovine pericardial patch angioplasty  . CATARACT EXTRACTION W/ INTRAOCULAR LENS  IMPLANT, BILATERAL    . CHOLECYSTECTOMY     [name] in [place] and [name] follows; surgical stricture post procedure with stent placement  . COLONOSCOPY W/ BIOPSIES AND POLYPECTOMY    . DILATION AND CURETTAGE OF UTERUS    . ENDARTERECTOMY Right 09/24/2018   Procedure: ENDARTERECTOMY CAROTID RIGHT;  Surgeon: Sandy Mould, MD;  Location: [facility];  Service: Vascular;  Laterality: Right;  . PV Angio  1994   L Renal artery stent     Family History  Problem Relation Age of Onset  . Heart failure Mother   . Hypertension Mother   . Cancer Father        lung  . Dementia Sister   . Cancer Sister        pancreatic, renal and thyroid  . Heart attack Brother   . AAA (abdominal aortic aneurysm) Brother   . Stroke Maternal Grandmother   . Diabetes Other     Social History   Socioeconomic History  . Marital status: Married    Spouse name: Not on file  . Number of children: 6  . Years of education: Not on file  . Highest education level: Not on file  Occupational History  . Not on file  Tobacco Use  . Smoking status: Never Smoker  . Smokeless tobacco: Never Used  Vaping Use  . Vaping Use: Never used  Substance and Sexual Activity  . Alcohol use: No    Alcohol/week: 0.0 standard drinks  . Drug use: No  . Sexual activity: Not Currently  Other Topics Concern  . Not on file  Social History Narrative   wears sunscreen, brushes and flosses daily, see's dentist bi-annually, has smoke/carbon monoxide detectors, wears a seatbelt and practices gun safety   Social Determinants of Health   Financial Resource Strain:   . Difficulty of  Paying Living Expenses: Not on file  Food Insecurity: No Food Insecurity  . Worried About Charity fundraiser in the Last Year: Never true  . Ran Out of Food in the Last Year: Never true  Transportation Needs: No Transportation Needs  . Lack of Transportation (Medical): No  . Lack of Transportation (Non-Medical): No  Physical Activity:   . Days of Exercise per Week: Not on file  . Minutes of Exercise per Session: Not on file  Stress:   . Feeling of Stress : Not on file  Social Connections:   . Frequency of Communication with Friends and Family: Not on file  .  Frequency of Social Gatherings with Friends and Family: Not on file  . Attends Religious Services: Not on file  . Active Member of Clubs or Organizations: Not on file  . Attends Archivist Meetings: Not on file  . Marital Status: Not on file  Intimate Partner Violence:   . Fear of Current or Ex-Partner: Not on file  . Emotionally Abused: Not on file  . Physically Abused: Not on file  . Sexually Abused: Not on file    Outpatient Medications Prior to Visit  Medication Sig Dispense Refill  . Accu-Chek FastClix Lancets MISC TEST BLOOD SUGAR TWICE DAILY E11.69 204 each 3  . Alcohol Swabs (B-D SINGLE USE SWABS REGULAR) PADS USE TWICE DAILY  E11.69 200 each 3  . ALPRAZolam (XANAX) 0.5 MG tablet TAKE ONE TABLET BY MOUTH EVERY NIGHT AT BEDTIME FOR INSOMNIA 90 tablet 0  . amLODipine (NORVASC) 5 MG tablet Take 5 mg by mouth.    . Apple Cider Vinegar 188 MG CAPS Take 2 capsules by mouth daily.    . bumetanide (BUMEX) 1 MG tablet Take 1 tablet (1 mg total) by mouth 2 (two) times daily. 180 tablet 3  . calcium carbonate (OSCAL) 1500 (600 Ca) MG TABS tablet Take by mouth.    . digoxin (LANOXIN) 0.125 MG tablet TAKE 1/2 TABLET EVERY DAY (SUBSTITUTED FOR  DIGITEK) 45 tablet 1  . DROPLET PEN NEEDLES 31G X 5 MM MISC USE AS DIRECTED WITH LEVEMIR 300 each 3  . fenofibrate micronized (LOFIBRA) 134 MG capsule Take 1 capsule (134 mg  total) by mouth daily. 90 capsule 1  . fluticasone (FLONASE) 50 MCG/ACT nasal spray USE 2 SPRAYS IN EACH NOSTRIL EVERY DAY AS NEEDED 48 g 3  . glucose blood (ACCU-CHEK SMARTVIEW) test strip CHECK BLOOD SUGAR TWICE DAILY 200 strip 3  . LEVEMIR FLEXTOUCH 100 UNIT/ML FlexPen INJECT 42 UNITS SUBCUTANEOUSLY ONE TIME DAILY (Patient taking differently: 5 Units. ) 45 mL 1  . levothyroxine (SYNTHROID) 50 MCG tablet TAKE 1 TABLET EVERY DAY 90 tablet 1  . losartan (COZAAR) 100 MG tablet TAKE 1 TABLET EVERY DAY 90 tablet 1  . magnesium oxide (MAG-OX) 400 MG tablet Take 400 mg by mouth 2 (two) times daily.    . Multiple Vitamin (MULTIVITAMIN WITH MINERALS) TABS tablet Take 1 tablet by mouth daily. Centrum Silver    . nystatin cream (MYCOSTATIN)     . olopatadine (PATADAY) 0.1 % ophthalmic solution Place 1 drop into both eyes daily.    Marland Kitchen omega-3 acid ethyl esters (LOVAZA) 1 g capsule TAKE 1 CAPSULE TWICE DAILY 180 capsule 1  . OVER THE COUNTER MEDICATION Take 2 tablets by mouth daily. Takes Beet Root tablets - 2 tablets daily     . oxyCODONE-acetaminophen (PERCOCET) 7.5-325 MG tablet Take 1 tablet by mouth every 6 (six) hours as needed (back pain.). 120 tablet 0  . polyethylene glycol (MIRALAX / GLYCOLAX) 17 g packet Take 17 g by mouth daily as needed.     . pravastatin (PRAVACHOL) 10 MG tablet TAKE 1 TABLET FOUR TIMES WEEKLY 48 tablet 1  . rOPINIRole (REQUIP) 1 MG tablet TAKE 1 TABLET AT BEDTIME 90 tablet 2  . spironolactone (ALDACTONE) 25 MG tablet Take 0.5 tablets (12.5 mg total) by mouth daily. 45 tablet 1  . tiZANidine (ZANAFLEX) 4 MG tablet TAKE 1 TABLET AT BEDTIME 90 tablet 0   Facility-Administered Medications Prior to Visit  Medication Dose Route Frequency Provider Last Rate Last Admin  . triamcinolone acetonide (  KENALOG-40) injection 40 mg  40 mg Intra-articular Once [name], MD        Allergies  Allergen Reactions  . Gabapentin Swelling  . Pregabalin Swelling       . Morphine Nausea  Only  . Penicillins Rash and Other (See Comments)    Has patient had a PCN reaction causing immediate rash, facial/tongue/throat swelling, SOB or lightheadedness with hypotension: No Has patient had a PCN reaction causing severe rash involving mucus membranes or skin necrosis: No Has patient had a PCN reaction that required hospitalization: No - MD office Has patient had a PCN reaction occurring within the last 10 years: No If all of the above answers are "NO", then may proceed with Cephalosporin use.   . Tape Rash  . Ace Inhibitors Other (See Comments)    Angioedema (ALLERGY/intolerance)  . Carvedilol Other (See Comments)    Myalgias (intolerance)  . Insulin Glargine Itching  . Metoprolol Other (See Comments)    Angioedema (ALLERGY/intolerance)  . Pantoprazole Nausea Only  . Procaine Other (See Comments)    unknown  . Statins Other (See Comments)    Myalgias (intolerance)  . Lisinopril Rash         ROS Review of Systems  Constitutional: Positive for fatigue. Negative for fever.  Respiratory: Negative for shortness of breath.   Cardiovascular: Negative for chest pain and palpitations.       Improved Hunt edema  Gastrointestinal: Negative for abdominal pain, diarrhea, nausea and vomiting.  Genitourinary: Negative for dysuria.  Skin: Negative for rash.  Neurological: Positive for dizziness and light-headedness. Negative for headaches.  Hematological: Bruises/bleeds easily.      Objective:    Physical Exam Constitutional:      Appearance: Normal appearance.  HENT:     Head: Normocephalic and atraumatic.  Eyes:     Conjunctiva/sclera: Conjunctivae normal.  Cardiovascular:     Rate and Rhythm: Normal rate and regular rhythm.     Pulses: Normal pulses.     Heart sounds: Normal heart sounds.  Pulmonary:     Breath sounds: Normal breath sounds.  Musculoskeletal:     Right lower leg: Edema present.     Left lower leg: Edema present.  Skin:    Findings: Bruising  present.  Neurological:     Mental Status: She is alert and oriented to person, place, and time.     BP (!) 138/58   Pulse 88   Resp 20   Ht 5\' 8"  (1.727 m)   Wt 177 lb (80.3 kg)   SpO2 99%   BMI 26.91 kg/m  Wt Readings from Last 3 Encounters:  09/13/20 177 lb (80.3 kg)  09/06/20 177 lb 12.8 oz (80.6 kg)  09/01/20 187 lb 6.4 oz (85 kg)     Health Maintenance Due  Topic Date Due  . FOOT EXAM  Never done  . OPHTHALMOLOGY EXAM  Never done    Lab Results  Component Value Date   TSH 2.440 05/25/2020   Lab Results  Component Value Date   WBC 6.5 09/01/2020   HGB 9.0 (L) 09/01/2020   HCT 28.4 (L) 09/01/2020   MCV 86 09/01/2020   PLT 434 09/01/2020   Lab Results  Component Value Date   NA 138 09/01/2020   K 4.7 09/01/2020   CO2 23 09/01/2020   GLUCOSE 107 (H) 09/01/2020   BUN 45 (H) 09/01/2020   CREATININE 1.75 (H) 09/01/2020   BILITOT 0.3 08/26/2020   ALKPHOS 35 (L) 08/26/2020  AST 23 08/26/2020   ALT 18 08/26/2020   PROT 5.4 (L) 08/26/2020   ALBUMIN 3.1 (L) 08/26/2020   CALCIUM 9.4 09/01/2020   ANIONGAP 11 09/25/2018   GFR 38.32 (L) 11/30/2014   Lab Results  Component Value Date   CHOL 99 (L) 08/26/2020   Lab Results  Component Value Date   HDL 31 (L) 08/26/2020   Lab Results  Component Value Date   LDLCALC 52 08/26/2020   Lab Results  Component Value Date   TRIG 78 08/26/2020   Lab Results  Component Value Date   CHOLHDL 3.2 08/26/2020   Lab Results  Component Value Date   HGBA1C 5.7 (H) 08/26/2020      Assessment & Plan:  1. Acute gastritis with hemorrhage, unspecified gastritis type GI appt, Hematology appt-will check with scheduling for follow up  2. Chronic kidney disease, stage IV (severe) (HCC) Less swelling on Bumex--Hunt  3. Type 2 diabetes mellitus with diabetic neuropathy, unspecified whether long term insulin use (HCC) Stable-glucose 110  4. Iron deficiency anemia, unspecified iron deficiency anemia type Sandy. Bobby Hunt  previous physician - Ambulatory referral to Hematology  Follow-up: GI/Hematology-HHC  [place]andria Drew Hannah Beat, MD

## 2020-09-14 DIAGNOSIS — E1122 Type 2 diabetes mellitus with diabetic chronic kidney disease: Secondary | ICD-10-CM | POA: Diagnosis not present

## 2020-09-14 DIAGNOSIS — I480 Paroxysmal atrial fibrillation: Secondary | ICD-10-CM | POA: Diagnosis not present

## 2020-09-14 DIAGNOSIS — K256 Chronic or unspecified gastric ulcer with both hemorrhage and perforation: Secondary | ICD-10-CM | POA: Diagnosis not present

## 2020-09-14 DIAGNOSIS — D509 Iron deficiency anemia, unspecified: Secondary | ICD-10-CM | POA: Diagnosis not present

## 2020-09-14 DIAGNOSIS — E7800 Pure hypercholesterolemia, unspecified: Secondary | ICD-10-CM | POA: Diagnosis not present

## 2020-09-14 DIAGNOSIS — I5022 Chronic systolic (congestive) heart failure: Secondary | ICD-10-CM | POA: Diagnosis not present

## 2020-09-14 DIAGNOSIS — N183 Chronic kidney disease, stage 3 unspecified: Secondary | ICD-10-CM | POA: Diagnosis not present

## 2020-09-14 DIAGNOSIS — I428 Other cardiomyopathies: Secondary | ICD-10-CM | POA: Diagnosis not present

## 2020-09-14 DIAGNOSIS — I129 Hypertensive chronic kidney disease with stage 1 through stage 4 chronic kidney disease, or unspecified chronic kidney disease: Secondary | ICD-10-CM | POA: Diagnosis not present

## 2020-09-15 DIAGNOSIS — E1122 Type 2 diabetes mellitus with diabetic chronic kidney disease: Secondary | ICD-10-CM | POA: Diagnosis not present

## 2020-09-15 DIAGNOSIS — I5022 Chronic systolic (congestive) heart failure: Secondary | ICD-10-CM | POA: Diagnosis not present

## 2020-09-15 DIAGNOSIS — I129 Hypertensive chronic kidney disease with stage 1 through stage 4 chronic kidney disease, or unspecified chronic kidney disease: Secondary | ICD-10-CM | POA: Diagnosis not present

## 2020-09-15 DIAGNOSIS — N183 Chronic kidney disease, stage 3 unspecified: Secondary | ICD-10-CM | POA: Diagnosis not present

## 2020-09-15 DIAGNOSIS — I480 Paroxysmal atrial fibrillation: Secondary | ICD-10-CM | POA: Diagnosis not present

## 2020-09-15 DIAGNOSIS — F411 Generalized anxiety disorder: Secondary | ICD-10-CM | POA: Insufficient documentation

## 2020-09-15 DIAGNOSIS — D509 Iron deficiency anemia, unspecified: Secondary | ICD-10-CM | POA: Diagnosis not present

## 2020-09-15 DIAGNOSIS — N184 Chronic kidney disease, stage 4 (severe): Secondary | ICD-10-CM | POA: Insufficient documentation

## 2020-09-15 DIAGNOSIS — K256 Chronic or unspecified gastric ulcer with both hemorrhage and perforation: Secondary | ICD-10-CM | POA: Diagnosis not present

## 2020-09-15 DIAGNOSIS — I428 Other cardiomyopathies: Secondary | ICD-10-CM | POA: Diagnosis not present

## 2020-09-15 DIAGNOSIS — E7800 Pure hypercholesterolemia, unspecified: Secondary | ICD-10-CM | POA: Diagnosis not present

## 2020-09-16 DIAGNOSIS — E1122 Type 2 diabetes mellitus with diabetic chronic kidney disease: Secondary | ICD-10-CM | POA: Diagnosis not present

## 2020-09-16 DIAGNOSIS — K256 Chronic or unspecified gastric ulcer with both hemorrhage and perforation: Secondary | ICD-10-CM | POA: Diagnosis not present

## 2020-09-16 DIAGNOSIS — I5022 Chronic systolic (congestive) heart failure: Secondary | ICD-10-CM | POA: Diagnosis not present

## 2020-09-16 DIAGNOSIS — I129 Hypertensive chronic kidney disease with stage 1 through stage 4 chronic kidney disease, or unspecified chronic kidney disease: Secondary | ICD-10-CM | POA: Diagnosis not present

## 2020-09-16 DIAGNOSIS — N183 Chronic kidney disease, stage 3 unspecified: Secondary | ICD-10-CM | POA: Diagnosis not present

## 2020-09-16 DIAGNOSIS — D509 Iron deficiency anemia, unspecified: Secondary | ICD-10-CM | POA: Diagnosis not present

## 2020-09-16 DIAGNOSIS — I480 Paroxysmal atrial fibrillation: Secondary | ICD-10-CM | POA: Diagnosis not present

## 2020-09-16 DIAGNOSIS — E7800 Pure hypercholesterolemia, unspecified: Secondary | ICD-10-CM | POA: Diagnosis not present

## 2020-09-16 DIAGNOSIS — I428 Other cardiomyopathies: Secondary | ICD-10-CM | POA: Diagnosis not present

## 2020-09-17 ENCOUNTER — Ambulatory Visit: Payer: Medicare HMO | Admitting: Cardiology

## 2020-09-19 ENCOUNTER — Other Ambulatory Visit: Payer: Self-pay | Admitting: Family Medicine

## 2020-09-19 DIAGNOSIS — I5022 Chronic systolic (congestive) heart failure: Secondary | ICD-10-CM

## 2020-09-20 ENCOUNTER — Other Ambulatory Visit: Payer: Self-pay

## 2020-09-20 MED ORDER — OXYCODONE-ACETAMINOPHEN 7.5-325 MG PO TABS: 1.0000 | ORAL_TABLET | Freq: Four times a day (QID) | ORAL | 0 refills | Status: DC | PRN

## 2020-09-21 ENCOUNTER — Telehealth: Payer: Self-pay

## 2020-09-21 NOTE — Telephone Encounter (Signed)
[name] called to report that Mrs. Rote deferred her physical therapy this week because of her son's death.  They will resume the normal routine next week.  Dr. Tobie Poet informed.

## 2020-09-22 ENCOUNTER — Other Ambulatory Visit: Payer: Self-pay

## 2020-09-22 MED ORDER — ALPRAZOLAM 0.5 MG PO TABS
ORAL_TABLET | ORAL | 0 refills | Status: DC
Start: 2020-09-22 — End: 2021-02-11

## 2020-09-24 DIAGNOSIS — I129 Hypertensive chronic kidney disease with stage 1 through stage 4 chronic kidney disease, or unspecified chronic kidney disease: Secondary | ICD-10-CM | POA: Diagnosis not present

## 2020-09-24 DIAGNOSIS — E7800 Pure hypercholesterolemia, unspecified: Secondary | ICD-10-CM | POA: Diagnosis not present

## 2020-09-24 DIAGNOSIS — I480 Paroxysmal atrial fibrillation: Secondary | ICD-10-CM | POA: Diagnosis not present

## 2020-09-24 DIAGNOSIS — K256 Chronic or unspecified gastric ulcer with both hemorrhage and perforation: Secondary | ICD-10-CM | POA: Diagnosis not present

## 2020-09-24 DIAGNOSIS — E1122 Type 2 diabetes mellitus with diabetic chronic kidney disease: Secondary | ICD-10-CM | POA: Diagnosis not present

## 2020-09-24 DIAGNOSIS — D509 Iron deficiency anemia, unspecified: Secondary | ICD-10-CM | POA: Diagnosis not present

## 2020-09-24 DIAGNOSIS — N183 Chronic kidney disease, stage 3 unspecified: Secondary | ICD-10-CM | POA: Diagnosis not present

## 2020-09-24 DIAGNOSIS — I428 Other cardiomyopathies: Secondary | ICD-10-CM | POA: Diagnosis not present

## 2020-09-24 DIAGNOSIS — I5022 Chronic systolic (congestive) heart failure: Secondary | ICD-10-CM | POA: Diagnosis not present

## 2020-09-28 ENCOUNTER — Telehealth: Payer: Self-pay | Admitting: Oncology

## 2020-09-28 ENCOUNTER — Other Ambulatory Visit: Payer: Self-pay | Admitting: Oncology

## 2020-09-28 DIAGNOSIS — D631 Anemia in chronic kidney disease: Secondary | ICD-10-CM

## 2020-09-28 DIAGNOSIS — K256 Chronic or unspecified gastric ulcer with both hemorrhage and perforation: Secondary | ICD-10-CM | POA: Diagnosis not present

## 2020-09-28 DIAGNOSIS — I428 Other cardiomyopathies: Secondary | ICD-10-CM | POA: Diagnosis not present

## 2020-09-28 DIAGNOSIS — E1122 Type 2 diabetes mellitus with diabetic chronic kidney disease: Secondary | ICD-10-CM | POA: Diagnosis not present

## 2020-09-28 DIAGNOSIS — N189 Chronic kidney disease, unspecified: Secondary | ICD-10-CM

## 2020-09-28 DIAGNOSIS — I480 Paroxysmal atrial fibrillation: Secondary | ICD-10-CM | POA: Diagnosis not present

## 2020-09-28 DIAGNOSIS — E7800 Pure hypercholesterolemia, unspecified: Secondary | ICD-10-CM | POA: Diagnosis not present

## 2020-09-28 DIAGNOSIS — N183 Chronic kidney disease, stage 3 unspecified: Secondary | ICD-10-CM | POA: Diagnosis not present

## 2020-09-28 DIAGNOSIS — D509 Iron deficiency anemia, unspecified: Secondary | ICD-10-CM | POA: Diagnosis not present

## 2020-09-28 DIAGNOSIS — I5022 Chronic systolic (congestive) heart failure: Secondary | ICD-10-CM | POA: Diagnosis not present

## 2020-09-28 DIAGNOSIS — I129 Hypertensive chronic kidney disease with stage 1 through stage 4 chronic kidney disease, or unspecified chronic kidney disease: Secondary | ICD-10-CM | POA: Diagnosis not present

## 2020-09-28 NOTE — Progress Notes (Signed)
[facility]  [address] [place],  [place]  [address] [phone]  Clinic Day:  09/29/2020  Referring physician: [name], MD   HISTORY OF PRESENT ILLNESS:  The patient is a 86 y.o. female with anemia secondary to both iron deficiency and renal insufficiency.  In the past, IV Feraheme was effective in replenishing her iron stores and improving her hemoglobin.  She comes in today to reassess her iron and hemoglobin levels.  Since her last visit, she was hospitalized in November for fatigue.  She reported having black stools at the time.  She was given 2 units of blood while hospitalized.  An EGD done at that time showed non-bleeding gastric ulcers.  Since her hospitalization, the patient claims to be doing okay.  She has some baseline fatigue, but denies having any overt forms of blood loss.  PHYSICAL EXAM:  Blood pressure 134/60, pulse 76, temperature 97.9 F (36.6 C), resp. rate 16, height 5\' 8"  (1.727 m), weight 182 lb 1.6 oz (82.6 kg), SpO2 98 %. Wt Readings from Last 3 Encounters:  09/29/20 182 lb 1.6 oz (82.6 kg)  09/13/20 177 lb (80.3 kg)  09/06/20 177 lb 12.8 oz (80.6 kg)   Body mass index is 27.69 kg/m. Performance status (ECOG): 2 - Symptomatic, <50% confined to bed Physical Exam Constitutional:      Appearance: Normal appearance. She is not ill-appearing.  HENT:     Mouth/Throat:     Mouth: Mucous membranes are moist.     Pharynx: Oropharynx is clear. No oropharyngeal exudate or posterior oropharyngeal erythema.  Cardiovascular:     Rate and Rhythm: Normal rate and regular rhythm.     Heart sounds: No murmur heard. No friction rub. No gallop.   Pulmonary:     Effort: Pulmonary effort is normal. No respiratory distress.     Breath sounds: Normal breath sounds. No wheezing, rhonchi or rales.  Chest:  Breasts:     Right: No axillary adenopathy or supraclavicular adenopathy.     Left: No axillary adenopathy or supraclavicular  adenopathy.    Abdominal:     General: Bowel sounds are normal. There is no distension.     Palpations: Abdomen is soft. There is no mass.     Tenderness: There is no abdominal tenderness.  Musculoskeletal:        General: No swelling.     Right lower leg: No edema.     Left lower leg: No edema.  Lymphadenopathy:     Cervical: No cervical adenopathy.     Upper Body:     Right upper body: No supraclavicular or axillary adenopathy.     Left upper body: No supraclavicular or axillary adenopathy.     Lower Body: No right inguinal adenopathy. No left inguinal adenopathy.  Skin:    General: Skin is warm.     Coloration: Skin is not jaundiced.     Findings: No lesion or rash.  Neurological:     General: No focal deficit present.     Mental Status: She is alert and oriented to person, place, and time. Mental status is at baseline.     Cranial Nerves: Cranial nerves are intact.  Psychiatric:        Mood and Affect: Mood normal.        Behavior: Behavior normal.        Thought Content: Thought content normal.    LABS:         Ref. Range 09/29/2020  00:00  Sodium Latest Ref Range: 137 - 147  135 (A)  Potassium Latest Ref Range: 3.4 - 5.3  4.5  Chloride Latest Ref Range: 99 - 108  100  CO2 Latest Ref Range: 13 - 22  28 (A)  Glucose Unknown 109  BUN Latest Ref Range: 4 - 21  48 (A)  Creatinine Latest Ref Range: 0.5 - 1.1  1.6 (A)  Calcium Latest Ref Range: 8.7 - 10.7  9.7  Alkaline Phosphatase Latest Ref Range: 25 - 125  44  Albumin Latest Ref Range: 3.5 - 5.0  3.5  AST Latest Ref Range: 13 - 35  35  ALT Latest Ref Range: 7 - 35  18  Bilirubin, Total Unknown 0.6    ASSESSMENT & PLAN:  Assessment/Plan:  A 86 y.o. female with anemia secondary to iron deficiency and kidney disease.  As her labs are consistent with a recurrent iron deficiency anemia, I will arrange for her to receive IV Feraheme 1020 mg over the next few weeks to replenish her iron stores and improve her  hemoglobin.  I will see her back in 3 months to reassess her iron and hemoglobin levels to see how well she responded to her upcoming IV iron.  The patient understands all the plans discussed today and is in agreement with them.    Murvin Gift Macarthur Critchley, MD

## 2020-09-28 NOTE — Telephone Encounter (Signed)
Patient returned call to schedule Labs, Follow up for Abnormal Labs per Dr. [name].  Patient scheduled for 09/29/20 Labs 10:45 am - Follow Up 11:15 am.  Patient is currently established with Dr Bobby Rumpf for Anemia.  Patient last seen 04/06/20

## 2020-09-29 ENCOUNTER — Other Ambulatory Visit: Payer: Self-pay | Admitting: Hematology and Oncology

## 2020-09-29 ENCOUNTER — Other Ambulatory Visit: Payer: Self-pay | Admitting: Oncology

## 2020-09-29 ENCOUNTER — Inpatient Hospital Stay: Payer: Medicare HMO | Attending: Oncology

## 2020-09-29 ENCOUNTER — Telehealth: Payer: Self-pay

## 2020-09-29 ENCOUNTER — Telehealth: Payer: Self-pay | Admitting: Oncology

## 2020-09-29 ENCOUNTER — Inpatient Hospital Stay (INDEPENDENT_AMBULATORY_CARE_PROVIDER_SITE_OTHER): Payer: Medicare HMO | Admitting: Oncology

## 2020-09-29 ENCOUNTER — Other Ambulatory Visit: Payer: Self-pay

## 2020-09-29 VITALS — BP 134/60 | HR 76 | Temp 97.9°F | Resp 16 | Ht 68.0 in | Wt 182.1 lb

## 2020-09-29 DIAGNOSIS — D509 Iron deficiency anemia, unspecified: Secondary | ICD-10-CM

## 2020-09-29 DIAGNOSIS — Z0001 Encounter for general adult medical examination with abnormal findings: Secondary | ICD-10-CM | POA: Diagnosis not present

## 2020-09-29 DIAGNOSIS — D631 Anemia in chronic kidney disease: Secondary | ICD-10-CM

## 2020-09-29 DIAGNOSIS — D649 Anemia, unspecified: Secondary | ICD-10-CM | POA: Diagnosis not present

## 2020-09-29 LAB — BASIC METABOLIC PANEL
BUN: 48 — AB (ref 4–21)
CO2: 28 — AB (ref 13–22)
Chloride: 100 (ref 99–108)
Creatinine: 1.6 — AB (ref 0.5–1.1)
Glucose: 109
Potassium: 4.5 (ref 3.4–5.3)
Sodium: 135 — AB (ref 137–147)

## 2020-09-29 LAB — IRON,TIBC AND FERRITIN PANEL: Iron: 25

## 2020-09-29 LAB — CBC AND DIFFERENTIAL
HCT: 25 — AB (ref 36–46)
Hemoglobin: 8 — AB (ref 12.0–16.0)
Neutrophils Absolute: 4.1
Platelets: 381 (ref 150–399)
WBC: 5.4

## 2020-09-29 LAB — CBC
MCV: 82 (ref 76–111)
RBC: 3.07 — AB (ref 3.87–5.11)

## 2020-09-29 LAB — COMPREHENSIVE METABOLIC PANEL
Albumin: 3.5 (ref 3.5–5.0)
Calcium: 9.7 (ref 8.7–10.7)

## 2020-09-29 LAB — HEPATIC FUNCTION PANEL
ALT: 18 (ref 7–35)
AST: 35 (ref 13–35)
Alkaline Phosphatase: 44 (ref 25–125)
Bilirubin, Total: 0.6

## 2020-09-29 NOTE — Telephone Encounter (Signed)
Per 12/22 LOS, patient scheduled for March 2022 Appt's  Gave patient Appt Summary

## 2020-09-29 NOTE — Telephone Encounter (Addendum)
Pt appt's have been scheduled for Jan 2022.   Pt notified of below, & verbalized understanding. She has had IV iron in past. I notified her that we would send scheduling message to schedulers. Feraheme requires insurance approval  & then scheduling would call her to set up appt, most likely after Christmas. Will send this to Estill Dooms, our pharmacist for a heads up as well, and an in basket to schedulers.    Pt called to get her lab results from this morning, per Dr Bobby Rumpf' instruction. Dr Bobby Rumpf reviewed labs, and states, "everything is consistent with iron deficiency anemia, even though ferritin pending. She needs to be set up for IV iron".

## 2020-09-30 DIAGNOSIS — I480 Paroxysmal atrial fibrillation: Secondary | ICD-10-CM | POA: Diagnosis not present

## 2020-09-30 DIAGNOSIS — E7800 Pure hypercholesterolemia, unspecified: Secondary | ICD-10-CM | POA: Diagnosis not present

## 2020-09-30 DIAGNOSIS — D509 Iron deficiency anemia, unspecified: Secondary | ICD-10-CM | POA: Diagnosis not present

## 2020-09-30 DIAGNOSIS — I428 Other cardiomyopathies: Secondary | ICD-10-CM | POA: Diagnosis not present

## 2020-09-30 DIAGNOSIS — I129 Hypertensive chronic kidney disease with stage 1 through stage 4 chronic kidney disease, or unspecified chronic kidney disease: Secondary | ICD-10-CM | POA: Diagnosis not present

## 2020-09-30 DIAGNOSIS — K256 Chronic or unspecified gastric ulcer with both hemorrhage and perforation: Secondary | ICD-10-CM | POA: Diagnosis not present

## 2020-09-30 DIAGNOSIS — E1122 Type 2 diabetes mellitus with diabetic chronic kidney disease: Secondary | ICD-10-CM | POA: Diagnosis not present

## 2020-09-30 DIAGNOSIS — N183 Chronic kidney disease, stage 3 unspecified: Secondary | ICD-10-CM | POA: Diagnosis not present

## 2020-09-30 DIAGNOSIS — I5022 Chronic systolic (congestive) heart failure: Secondary | ICD-10-CM | POA: Diagnosis not present

## 2020-10-05 ENCOUNTER — Ambulatory Visit (INDEPENDENT_AMBULATORY_CARE_PROVIDER_SITE_OTHER): Payer: Medicare HMO

## 2020-10-05 ENCOUNTER — Telehealth: Payer: Self-pay | Admitting: Oncology

## 2020-10-05 DIAGNOSIS — I480 Paroxysmal atrial fibrillation: Secondary | ICD-10-CM | POA: Diagnosis not present

## 2020-10-05 DIAGNOSIS — E7800 Pure hypercholesterolemia, unspecified: Secondary | ICD-10-CM | POA: Diagnosis not present

## 2020-10-05 DIAGNOSIS — I420 Dilated cardiomyopathy: Secondary | ICD-10-CM

## 2020-10-05 DIAGNOSIS — I4821 Permanent atrial fibrillation: Secondary | ICD-10-CM

## 2020-10-05 DIAGNOSIS — D509 Iron deficiency anemia, unspecified: Secondary | ICD-10-CM | POA: Diagnosis not present

## 2020-10-05 DIAGNOSIS — I5022 Chronic systolic (congestive) heart failure: Secondary | ICD-10-CM | POA: Diagnosis not present

## 2020-10-05 DIAGNOSIS — E1122 Type 2 diabetes mellitus with diabetic chronic kidney disease: Secondary | ICD-10-CM | POA: Diagnosis not present

## 2020-10-05 DIAGNOSIS — N183 Chronic kidney disease, stage 3 unspecified: Secondary | ICD-10-CM | POA: Diagnosis not present

## 2020-10-05 DIAGNOSIS — I129 Hypertensive chronic kidney disease with stage 1 through stage 4 chronic kidney disease, or unspecified chronic kidney disease: Secondary | ICD-10-CM | POA: Diagnosis not present

## 2020-10-05 DIAGNOSIS — K256 Chronic or unspecified gastric ulcer with both hemorrhage and perforation: Secondary | ICD-10-CM | POA: Diagnosis not present

## 2020-10-05 DIAGNOSIS — I428 Other cardiomyopathies: Secondary | ICD-10-CM | POA: Diagnosis not present

## 2020-10-05 LAB — CUP PACEART REMOTE DEVICE CHECK
Battery Remaining Longevity: 91 mo
Battery Voltage: 2.98 V
Brady Statistic AP VP Percent: 0.1 %
Brady Statistic AP VS Percent: 99 %
Brady Statistic AS VP Percent: 0.02 %
Brady Statistic AS VS Percent: 0.88 %
Brady Statistic RA Percent Paced: 99.07 %
Brady Statistic RV Percent Paced: 0.12 %
Date Time Interrogation Session: 20211227234640
Implantable Lead Implant Date: 20080908
Implantable Lead Implant Date: 20080908
Implantable Lead Implant Date: 20190326
Implantable Lead Location: 753858
Implantable Lead Location: 753860
Implantable Lead Location: 753860
Implantable Lead Model: 3830
Implantable Lead Model: 4194
Implantable Lead Model: 7121
Implantable Pulse Generator Implant Date: 20190326
Lead Channel Impedance Value: 209 Ohm
Lead Channel Impedance Value: 228 Ohm
Lead Channel Impedance Value: 304 Ohm
Lead Channel Impedance Value: 304 Ohm
Lead Channel Impedance Value: 342 Ohm
Lead Channel Impedance Value: 361 Ohm
Lead Channel Impedance Value: 437 Ohm
Lead Channel Impedance Value: 456 Ohm
Lead Channel Impedance Value: 456 Ohm
Lead Channel Sensing Intrinsic Amplitude: 4.5 mV
Lead Channel Sensing Intrinsic Amplitude: 4.5 mV
Lead Channel Sensing Intrinsic Amplitude: 9.5 mV
Lead Channel Sensing Intrinsic Amplitude: 9.5 mV
Lead Channel Setting Pacing Amplitude: 2 V
Lead Channel Setting Pacing Amplitude: 2.5 V
Lead Channel Setting Pacing Pulse Width: 0.4 ms
Lead Channel Setting Sensing Sensitivity: 1.2 mV

## 2020-10-05 NOTE — Telephone Encounter (Signed)
Patient called to confirm 10/11/20, 10/18/20 Infusion Appt's

## 2020-10-05 NOTE — Telephone Encounter (Signed)
12/28 left vm for feraheme appts

## 2020-10-05 NOTE — Addendum Note (Signed)
Addended by: Juanetta Beets on: 10/05/2020 12:03 PM   Modules accepted: Orders

## 2020-10-06 DIAGNOSIS — I5022 Chronic systolic (congestive) heart failure: Secondary | ICD-10-CM | POA: Diagnosis not present

## 2020-10-06 DIAGNOSIS — D509 Iron deficiency anemia, unspecified: Secondary | ICD-10-CM | POA: Diagnosis not present

## 2020-10-06 DIAGNOSIS — E7800 Pure hypercholesterolemia, unspecified: Secondary | ICD-10-CM | POA: Diagnosis not present

## 2020-10-06 DIAGNOSIS — I129 Hypertensive chronic kidney disease with stage 1 through stage 4 chronic kidney disease, or unspecified chronic kidney disease: Secondary | ICD-10-CM | POA: Diagnosis not present

## 2020-10-06 DIAGNOSIS — I428 Other cardiomyopathies: Secondary | ICD-10-CM | POA: Diagnosis not present

## 2020-10-06 DIAGNOSIS — N183 Chronic kidney disease, stage 3 unspecified: Secondary | ICD-10-CM | POA: Diagnosis not present

## 2020-10-06 DIAGNOSIS — K256 Chronic or unspecified gastric ulcer with both hemorrhage and perforation: Secondary | ICD-10-CM | POA: Diagnosis not present

## 2020-10-06 DIAGNOSIS — E1122 Type 2 diabetes mellitus with diabetic chronic kidney disease: Secondary | ICD-10-CM | POA: Diagnosis not present

## 2020-10-06 DIAGNOSIS — I480 Paroxysmal atrial fibrillation: Secondary | ICD-10-CM | POA: Diagnosis not present

## 2020-10-07 DIAGNOSIS — I129 Hypertensive chronic kidney disease with stage 1 through stage 4 chronic kidney disease, or unspecified chronic kidney disease: Secondary | ICD-10-CM | POA: Diagnosis not present

## 2020-10-07 DIAGNOSIS — N183 Chronic kidney disease, stage 3 unspecified: Secondary | ICD-10-CM | POA: Diagnosis not present

## 2020-10-07 DIAGNOSIS — I428 Other cardiomyopathies: Secondary | ICD-10-CM | POA: Diagnosis not present

## 2020-10-07 DIAGNOSIS — E1122 Type 2 diabetes mellitus with diabetic chronic kidney disease: Secondary | ICD-10-CM | POA: Diagnosis not present

## 2020-10-07 DIAGNOSIS — K256 Chronic or unspecified gastric ulcer with both hemorrhage and perforation: Secondary | ICD-10-CM | POA: Diagnosis not present

## 2020-10-07 DIAGNOSIS — E7800 Pure hypercholesterolemia, unspecified: Secondary | ICD-10-CM | POA: Diagnosis not present

## 2020-10-07 DIAGNOSIS — I480 Paroxysmal atrial fibrillation: Secondary | ICD-10-CM | POA: Diagnosis not present

## 2020-10-07 DIAGNOSIS — I5022 Chronic systolic (congestive) heart failure: Secondary | ICD-10-CM | POA: Diagnosis not present

## 2020-10-07 DIAGNOSIS — D509 Iron deficiency anemia, unspecified: Secondary | ICD-10-CM | POA: Diagnosis not present

## 2020-10-08 DIAGNOSIS — N183 Chronic kidney disease, stage 3 unspecified: Secondary | ICD-10-CM | POA: Diagnosis not present

## 2020-10-08 DIAGNOSIS — E1122 Type 2 diabetes mellitus with diabetic chronic kidney disease: Secondary | ICD-10-CM | POA: Diagnosis not present

## 2020-10-08 DIAGNOSIS — D509 Iron deficiency anemia, unspecified: Secondary | ICD-10-CM | POA: Diagnosis not present

## 2020-10-08 DIAGNOSIS — K256 Chronic or unspecified gastric ulcer with both hemorrhage and perforation: Secondary | ICD-10-CM | POA: Diagnosis not present

## 2020-10-08 DIAGNOSIS — E7800 Pure hypercholesterolemia, unspecified: Secondary | ICD-10-CM | POA: Diagnosis not present

## 2020-10-08 DIAGNOSIS — I428 Other cardiomyopathies: Secondary | ICD-10-CM | POA: Diagnosis not present

## 2020-10-08 DIAGNOSIS — I480 Paroxysmal atrial fibrillation: Secondary | ICD-10-CM | POA: Diagnosis not present

## 2020-10-08 DIAGNOSIS — I5022 Chronic systolic (congestive) heart failure: Secondary | ICD-10-CM | POA: Diagnosis not present

## 2020-10-08 DIAGNOSIS — I129 Hypertensive chronic kidney disease with stage 1 through stage 4 chronic kidney disease, or unspecified chronic kidney disease: Secondary | ICD-10-CM | POA: Diagnosis not present

## 2020-10-11 ENCOUNTER — Other Ambulatory Visit: Payer: Self-pay

## 2020-10-11 ENCOUNTER — Inpatient Hospital Stay: Payer: Medicare HMO | Attending: Oncology

## 2020-10-11 VITALS — BP 132/44 | HR 85 | Temp 98.1°F | Resp 18 | Ht 68.0 in | Wt 188.0 lb

## 2020-10-11 DIAGNOSIS — D649 Anemia, unspecified: Secondary | ICD-10-CM

## 2020-10-11 DIAGNOSIS — D509 Iron deficiency anemia, unspecified: Secondary | ICD-10-CM | POA: Insufficient documentation

## 2020-10-11 MED ORDER — SODIUM CHLORIDE 0.9 % IV SOLN
510.0000 mg | Freq: Once | INTRAVENOUS | Status: AC
  Administered 2020-10-11: 510 mg via INTRAVENOUS
  Filled 2020-10-11: qty 17

## 2020-10-11 MED ORDER — SODIUM CHLORIDE 0.9 % IV SOLN
Freq: Once | INTRAVENOUS | Status: AC
  Filled 2020-10-11: qty 250

## 2020-10-11 NOTE — Patient Instructions (Signed)

## 2020-10-12 DIAGNOSIS — E1122 Type 2 diabetes mellitus with diabetic chronic kidney disease: Secondary | ICD-10-CM | POA: Diagnosis not present

## 2020-10-12 DIAGNOSIS — I129 Hypertensive chronic kidney disease with stage 1 through stage 4 chronic kidney disease, or unspecified chronic kidney disease: Secondary | ICD-10-CM | POA: Diagnosis not present

## 2020-10-12 DIAGNOSIS — I480 Paroxysmal atrial fibrillation: Secondary | ICD-10-CM | POA: Diagnosis not present

## 2020-10-12 DIAGNOSIS — E7800 Pure hypercholesterolemia, unspecified: Secondary | ICD-10-CM | POA: Diagnosis not present

## 2020-10-12 DIAGNOSIS — N183 Chronic kidney disease, stage 3 unspecified: Secondary | ICD-10-CM | POA: Diagnosis not present

## 2020-10-12 DIAGNOSIS — K256 Chronic or unspecified gastric ulcer with both hemorrhage and perforation: Secondary | ICD-10-CM | POA: Diagnosis not present

## 2020-10-12 DIAGNOSIS — I428 Other cardiomyopathies: Secondary | ICD-10-CM | POA: Diagnosis not present

## 2020-10-12 DIAGNOSIS — I5022 Chronic systolic (congestive) heart failure: Secondary | ICD-10-CM | POA: Diagnosis not present

## 2020-10-12 DIAGNOSIS — D509 Iron deficiency anemia, unspecified: Secondary | ICD-10-CM | POA: Diagnosis not present

## 2020-10-18 ENCOUNTER — Other Ambulatory Visit: Payer: Self-pay

## 2020-10-18 ENCOUNTER — Inpatient Hospital Stay: Payer: Medicare HMO

## 2020-10-18 ENCOUNTER — Other Ambulatory Visit: Payer: Self-pay | Admitting: Family Medicine

## 2020-10-18 VITALS — BP 142/43 | HR 75 | Temp 97.8°F | Resp 18 | Ht 68.0 in | Wt 193.8 lb

## 2020-10-18 DIAGNOSIS — D649 Anemia, unspecified: Secondary | ICD-10-CM

## 2020-10-18 DIAGNOSIS — I482 Chronic atrial fibrillation, unspecified: Secondary | ICD-10-CM

## 2020-10-18 DIAGNOSIS — D509 Iron deficiency anemia, unspecified: Secondary | ICD-10-CM | POA: Diagnosis not present

## 2020-10-18 MED ORDER — SODIUM CHLORIDE 0.9 % IV SOLN
510.0000 mg | Freq: Once | INTRAVENOUS | Status: AC
  Administered 2020-10-18: 510 mg via INTRAVENOUS
  Filled 2020-10-18: qty 17

## 2020-10-18 MED ORDER — SODIUM CHLORIDE 0.9 % IV SOLN
Freq: Once | INTRAVENOUS | Status: AC
  Filled 2020-10-18: qty 250

## 2020-10-18 NOTE — Patient Instructions (Signed)
Ferumoxytol injection What is this medicine? FERUMOXYTOL is an iron complex. Iron is used to make healthy red blood cells, which carry oxygen and nutrients throughout the body. This medicine is used to treat iron deficiency anemia. This medicine may be used for other purposes; ask your health care provider or pharmacist if you have questions. COMMON BRAND NAME(S): Feraheme What should I tell my health care provider before I take this medicine? They need to know if you have any of these conditions:  anemia not caused by low iron levels  high levels of iron in the blood  magnetic resonance imaging (MRI) test scheduled  an unusual or allergic reaction to iron, other medicines, foods, dyes, or preservatives  pregnant or trying to get pregnant  breast-feeding How should I use this medicine? This medicine is for injection into a vein. It is given by a health care professional in a hospital or clinic setting. Talk to your pediatrician regarding the use of this medicine in children. Special care may be needed. Overdosage: If you think you have taken too much of this medicine contact a poison control center or emergency room at once. NOTE: This medicine is only for you. Do not share this medicine with others. What if I miss a dose? It is important not to miss your dose. Call your doctor or health care professional if you are unable to keep an appointment. What may interact with this medicine? This medicine may interact with the following medications:  other iron products This list may not describe all possible interactions. Give your health care provider a list of all the medicines, herbs, non-prescription drugs, or dietary supplements you use. Also tell them if you smoke, drink alcohol, or use illegal drugs. Some items may interact with your medicine. What should I watch for while using this medicine? Visit your doctor or healthcare professional regularly. Tell your doctor or healthcare  professional if your symptoms do not start to get better or if they get worse. You may need blood work done while you are taking this medicine. You may need to follow a special diet. Talk to your doctor. Foods that contain iron include: whole grains/cereals, dried fruits, beans, or peas, leafy green vegetables, and organ meats (liver, kidney). What side effects may I notice from receiving this medicine? Side effects that you should report to your doctor or health care professional as soon as possible:  allergic reactions like skin rash, itching or hives, swelling of the face, lips, or tongue  breathing problems  changes in blood pressure  feeling faint or lightheaded, falls  fever or chills  flushing, sweating, or hot feelings  swelling of the ankles or feet Side effects that usually do not require medical attention (report to your doctor or health care professional if they continue or are bothersome):  diarrhea  headache  nausea, vomiting  stomach pain This list may not describe all possible side effects. Call your doctor for medical advice about side effects. You may report side effects to FDA at 1-800-FDA-1088. Where should I keep my medicine? This drug is given in a hospital or clinic and will not be stored at home. NOTE: This sheet is a summary. It may not cover all possible information. If you have questions about this medicine, talk to your doctor, pharmacist, or health care provider.  2021 Elsevier/Gold Standard (2016-11-13 20:21:10)  

## 2020-10-19 ENCOUNTER — Telehealth: Payer: Self-pay

## 2020-10-19 DIAGNOSIS — I428 Other cardiomyopathies: Secondary | ICD-10-CM | POA: Diagnosis not present

## 2020-10-19 DIAGNOSIS — N183 Chronic kidney disease, stage 3 unspecified: Secondary | ICD-10-CM | POA: Diagnosis not present

## 2020-10-19 DIAGNOSIS — K256 Chronic or unspecified gastric ulcer with both hemorrhage and perforation: Secondary | ICD-10-CM | POA: Diagnosis not present

## 2020-10-19 DIAGNOSIS — I5022 Chronic systolic (congestive) heart failure: Secondary | ICD-10-CM | POA: Diagnosis not present

## 2020-10-19 DIAGNOSIS — E1122 Type 2 diabetes mellitus with diabetic chronic kidney disease: Secondary | ICD-10-CM | POA: Diagnosis not present

## 2020-10-19 DIAGNOSIS — I480 Paroxysmal atrial fibrillation: Secondary | ICD-10-CM | POA: Diagnosis not present

## 2020-10-19 DIAGNOSIS — I129 Hypertensive chronic kidney disease with stage 1 through stage 4 chronic kidney disease, or unspecified chronic kidney disease: Secondary | ICD-10-CM | POA: Diagnosis not present

## 2020-10-19 DIAGNOSIS — E7800 Pure hypercholesterolemia, unspecified: Secondary | ICD-10-CM | POA: Diagnosis not present

## 2020-10-19 DIAGNOSIS — D509 Iron deficiency anemia, unspecified: Secondary | ICD-10-CM | POA: Diagnosis not present

## 2020-10-19 NOTE — Telephone Encounter (Signed)
[name] called to report that [name] has had a 7 lb weight gain over the last week.  She was seen at the [facility] yesterday and instructed to take an additional bumex.  Makenzie was called and she is going to continue to monitor her weight and call us with any additional increase.  She also wanted to let the physician know that she stopped her fenofibrate because of adverse side effects.

## 2020-10-19 NOTE — Progress Notes (Signed)
Remote pacemaker transmission.   

## 2020-10-20 ENCOUNTER — Other Ambulatory Visit: Payer: Self-pay

## 2020-10-20 DIAGNOSIS — K256 Chronic or unspecified gastric ulcer with both hemorrhage and perforation: Secondary | ICD-10-CM | POA: Diagnosis not present

## 2020-10-20 DIAGNOSIS — E1122 Type 2 diabetes mellitus with diabetic chronic kidney disease: Secondary | ICD-10-CM | POA: Diagnosis not present

## 2020-10-20 DIAGNOSIS — D509 Iron deficiency anemia, unspecified: Secondary | ICD-10-CM | POA: Diagnosis not present

## 2020-10-20 DIAGNOSIS — I480 Paroxysmal atrial fibrillation: Secondary | ICD-10-CM | POA: Diagnosis not present

## 2020-10-20 DIAGNOSIS — E7800 Pure hypercholesterolemia, unspecified: Secondary | ICD-10-CM | POA: Diagnosis not present

## 2020-10-20 DIAGNOSIS — I129 Hypertensive chronic kidney disease with stage 1 through stage 4 chronic kidney disease, or unspecified chronic kidney disease: Secondary | ICD-10-CM | POA: Diagnosis not present

## 2020-10-20 DIAGNOSIS — N183 Chronic kidney disease, stage 3 unspecified: Secondary | ICD-10-CM | POA: Diagnosis not present

## 2020-10-20 DIAGNOSIS — I428 Other cardiomyopathies: Secondary | ICD-10-CM | POA: Diagnosis not present

## 2020-10-20 DIAGNOSIS — I5022 Chronic systolic (congestive) heart failure: Secondary | ICD-10-CM | POA: Diagnosis not present

## 2020-10-20 MED ORDER — OXYCODONE-ACETAMINOPHEN 7.5-325 MG PO TABS: 1.0000 | ORAL_TABLET | Freq: Four times a day (QID) | ORAL | 0 refills | Status: DC | PRN

## 2020-10-26 ENCOUNTER — Telehealth: Payer: Self-pay

## 2020-10-26 NOTE — Progress Notes (Addendum)
Chronic Care Management Pharmacy Assistant   Name: [name]  MRN: [id number] DOB: 10/23/1933  Reason for Encounter: Disease State call for diabetes  Patient Questions:  1.  Have you seen any other providers since your last visit? Yes, 09/29/21 Saw Oncology and an iron infusion was ordered.   2.  Any changes in your medicines or health? Yes, 10/19/20, patient reported stopping Fenofibrate 134mg  due to muscle pain and shortness of breath.      PCP : [name], MD  Allergies:   Allergies  Allergen Reactions   Gabapentin Swelling   Pregabalin Swelling        Morphine Nausea Only   Penicillins Rash and Other (See Comments)    Has patient had a PCN reaction causing immediate rash, facial/tongue/throat swelling, SOB or lightheadedness with hypotension: No Has patient had a PCN reaction causing severe rash involving mucus membranes or skin necrosis: No Has patient had a PCN reaction that required hospitalization: No - MD office Has patient had a PCN reaction occurring within the last 10 years: No If all of the above answers are "NO", then may proceed with Cephalosporin use.    Tape Rash   Ace Inhibitors Other (See Comments)    Angioedema (ALLERGY/intolerance)   Carvedilol Other (See Comments)    Myalgias (intolerance)   Insulin Glargine Itching   Metoprolol Other (See Comments)    Angioedema (ALLERGY/intolerance)   Pantoprazole Nausea Only   Procaine Other (See Comments)    unknown   Statins Other (See Comments)    Myalgias (intolerance)   Lisinopril Rash         Medications: Outpatient Encounter Medications as of 10/26/2020  Medication Sig   Accu-Chek FastClix Lancets MISC TEST BLOOD SUGAR TWICE DAILY E11.69   Alcohol Swabs (B-D SINGLE USE SWABS REGULAR) PADS USE TWICE DAILY  E11.69   ALPRAZolam (XANAX) 0.5 MG tablet TAKE ONE TABLET BY MOUTH EVERY NIGHT AT BEDTIME FOR INSOMNIA   amLODipine (NORVASC) 5 MG tablet TAKE 1 TABLET EVERY DAY   Apple  Cider Vinegar 188 MG CAPS Take 2 capsules by mouth daily.   bumetanide (BUMEX) 1 MG tablet Take 1 tablet (1 mg total) by mouth 2 (two) times daily.   calcium carbonate (OSCAL) 1500 (600 Ca) MG TABS tablet Take by mouth.   digoxin (LANOXIN) 0.125 MG tablet TAKE 1/2 TABLET EVERY DAY (SUBSTITUTED FOR  DIGITEK)   DROPLET PEN NEEDLES 31G X 5 MM MISC USE AS DIRECTED WITH LEVEMIR   fluticasone (FLONASE) 50 MCG/ACT nasal spray USE 2 SPRAYS IN EACH NOSTRIL EVERY DAY AS NEEDED   glucose blood (ACCU-CHEK SMARTVIEW) test strip CHECK BLOOD SUGAR TWICE DAILY   LEVEMIR FLEXTOUCH 100 UNIT/ML FlexPen INJECT 42 UNITS SUBCUTANEOUSLY ONE TIME DAILY (Patient taking differently: 5 Units. )   levothyroxine (SYNTHROID) 50 MCG tablet TAKE 1 TABLET EVERY DAY   losartan (COZAAR) 100 MG tablet TAKE 1 TABLET EVERY DAY   magnesium oxide (MAG-OX) 400 MG tablet Take 400 mg by mouth 2 (two) times daily.   Multiple Vitamin (MULTIVITAMIN WITH MINERALS) TABS tablet Take 1 tablet by mouth daily. Centrum Silver   nystatin cream (MYCOSTATIN)    olopatadine (PATADAY) 0.1 % ophthalmic solution Place 1 drop into both eyes daily.   omega-3 acid ethyl esters (LOVAZA) 1 g capsule TAKE 1 CAPSULE TWICE DAILY   OVER THE COUNTER MEDICATION Take 2 tablets by mouth daily. Takes Beet Root tablets - 2 tablets daily    oxyCODONE-acetaminophen (PERCOCET) 7.5-325 MG  tablet Take 1 tablet by mouth every 6 (six) hours as needed (back pain.).   polyethylene glycol (MIRALAX / GLYCOLAX) 17 g packet Take 17 g by mouth daily as needed.    pravastatin (PRAVACHOL) 10 MG tablet TAKE 1 TABLET FOUR TIMES WEEKLY   rOPINIRole (REQUIP) 1 MG tablet TAKE 1 TABLET AT BEDTIME   spironolactone (ALDACTONE) 25 MG tablet Take 0.5 tablets (12.5 mg total) by mouth daily.   tiZANidine (ZANAFLEX) 4 MG tablet TAKE 1 TABLET AT BEDTIME   XARELTO 15 MG TABS tablet TAKE 1 TABLET DAILY WITH SUPPER.   Facility-Administered Encounter Medications as of 10/26/2020  Medication    triamcinolone acetonide (KENALOG-40) injection 40 mg    Current Diagnosis: Patient Active Problem List   Diagnosis Date Noted   Chronic kidney disease, stage 4 (severe) (HCC)    GAD (generalized anxiety disorder)    Acute gastritis with hemorrhage 09/06/2020   Iron deficiency anemia 09/06/2020   Amiodarone pulmonary toxicity    Anemia    Arthritis    Carotid artery occlusion    CHF (congestive heart failure) (HCC)    Chronic a-fib (HCC)    Chronic kidney disease, stage IV (severe) (HCC)    Diabetes ([place])    Fibromyalgia    Full dentures    GERD (gastroesophageal reflux disease)    Hyperlipemia    Hypertension    Hypertensive heart disease with heart failure (HCC)    Hypothalamic disease (HCC)    Hypothyroidism    LBBB (left bundle branch block)    Occlusion and stenosis of right carotid artery    Other persistent atrial fibrillation ([place])    Pneumonia    Presence of automatic (implantable) cardiac defibrillator    PVD (peripheral vascular disease) ([place])    RAS (renal artery stenosis) ([place])    Renal insufficiency, mild    Restless leg syndrome    Type 2 diabetes mellitus with diabetic neuropathy ([place])    Wears glasses    Mixed hyperlipidemia 12/25/2019   Dyslipidemia associated with type 2 diabetes mellitus ([place]) 12/25/2019   Secondary hypothyroidism 12/25/2019   Diabetic polyneuropathy associated with diabetes mellitus due to underlying condition ([place]) 12/01/2019   Primary osteoarthritis of left knee 12/01/2019   Carotid artery stenosis 09/24/2018   NSVT (nonsustained ventricular tachycardia) ([place]) 09/18/2018   Hallux limitus of right foot 06/27/2016   ATRIAL FIBRILLATION 12/26/2010   VENTRICULAR FIBRILLATION 09/38/1829   Chronic systolic heart failure ([place]) 12/26/2010   Automatic implantable cardioverter-defibrillator in situ 12/26/2010   Cardiomyopathy ([place]) 12/23/2010   ATRIOVENTRICULAR BLOCK, 3RD DEGREE 12/23/2010   AV BLOCK 12/23/2010   ICD (implantable  cardioverter-defibrillator), biventricular, in situ 12/23/2010   Complete atrioventricular block ([place]) 12/23/2010   H/O cardiovascular stress test 08/04/2010   H/O echocardiogram 07/05/2010   VT (ventricular tachycardia) ([place]) 06/2010   Recent Relevant Labs: Lab Results  Component Value Date/Time   HGBA1C 5.7 (H) 08/26/2020 01:28 PM   HGBA1C 6.2 (H) 05/25/2020 12:15 PM   MICROALBUR 10 12/25/2019 12:45 PM    Kidney Function Lab Results  Component Value Date/Time   CREATININE 1.6 (A) 09/29/2020 12:00 AM   CREATININE 1.75 (H) 09/01/2020 11:14 AM   CREATININE 2.53 (H) 08/26/2020 01:28 PM   GFR 38.32 (L) 11/30/2014 12:13 PM   GFRNONAA 27 (L) 09/01/2020 11:14 AM   GFRAA 31 (L) 09/01/2020 11:14 AM    Current antihyperglycemic regimen: Levermir  What recent interventions/DTPs have been made to improve glycemic control: She is watching her diet, she is  getting off pork, watching her salt intake.  Have there been any recent hospitalizations or ED visits since last visit with CPP? No   Patient reports hypoglycemic symptoms, including Pale, Sweaty, Shaky and Nervous/irritable, patient states this is rare.  Patient reports hyperglycemic symptoms, including blurry vision, excessive thirst and fatigue, patient states this is rare.   How often are you checking your blood sugar? once daily   What are your blood sugars ranging?  Fasting: 120-127 Before meals: None After meals: None Bedtime: None  During the week, how often does your blood glucose drop below 70? Patient stated the last time this happened was in the hospital.    Are you checking your feet daily/regularly? Patient states she keeps feet elevated, her weight gain from fluid has gotten better.  She checks her feet daily.  Patient had a recent death in her family of her son.  His birthday was this week and she is having some difficulty with the loss.  She was very proud of him and was talking about his life and funeral.     Adherence Review: Is the patient currently on a STATIN medication? Yes Is the patient currently on ACE/ARB medication? Yes Does the patient have >5 day gap between last estimated fill dates? No   Follow-Up:  Pharmacist Review  Donette Larry, CPP notified  Clarita Leber, [place] Pharmacist Assistant [phone]

## 2020-10-27 DIAGNOSIS — I428 Other cardiomyopathies: Secondary | ICD-10-CM | POA: Diagnosis not present

## 2020-10-27 DIAGNOSIS — E1122 Type 2 diabetes mellitus with diabetic chronic kidney disease: Secondary | ICD-10-CM | POA: Diagnosis not present

## 2020-10-27 DIAGNOSIS — I129 Hypertensive chronic kidney disease with stage 1 through stage 4 chronic kidney disease, or unspecified chronic kidney disease: Secondary | ICD-10-CM | POA: Diagnosis not present

## 2020-10-27 DIAGNOSIS — I5022 Chronic systolic (congestive) heart failure: Secondary | ICD-10-CM | POA: Diagnosis not present

## 2020-10-27 DIAGNOSIS — I480 Paroxysmal atrial fibrillation: Secondary | ICD-10-CM | POA: Diagnosis not present

## 2020-10-27 DIAGNOSIS — D509 Iron deficiency anemia, unspecified: Secondary | ICD-10-CM | POA: Diagnosis not present

## 2020-10-27 DIAGNOSIS — K256 Chronic or unspecified gastric ulcer with both hemorrhage and perforation: Secondary | ICD-10-CM | POA: Diagnosis not present

## 2020-10-27 DIAGNOSIS — E7800 Pure hypercholesterolemia, unspecified: Secondary | ICD-10-CM | POA: Diagnosis not present

## 2020-10-27 DIAGNOSIS — N183 Chronic kidney disease, stage 3 unspecified: Secondary | ICD-10-CM | POA: Diagnosis not present

## 2020-10-28 DIAGNOSIS — N183 Chronic kidney disease, stage 3 unspecified: Secondary | ICD-10-CM | POA: Diagnosis not present

## 2020-10-28 DIAGNOSIS — I480 Paroxysmal atrial fibrillation: Secondary | ICD-10-CM | POA: Diagnosis not present

## 2020-10-28 DIAGNOSIS — E7800 Pure hypercholesterolemia, unspecified: Secondary | ICD-10-CM | POA: Diagnosis not present

## 2020-10-28 DIAGNOSIS — E1122 Type 2 diabetes mellitus with diabetic chronic kidney disease: Secondary | ICD-10-CM | POA: Diagnosis not present

## 2020-10-28 DIAGNOSIS — I129 Hypertensive chronic kidney disease with stage 1 through stage 4 chronic kidney disease, or unspecified chronic kidney disease: Secondary | ICD-10-CM | POA: Diagnosis not present

## 2020-10-28 DIAGNOSIS — I428 Other cardiomyopathies: Secondary | ICD-10-CM | POA: Diagnosis not present

## 2020-10-28 DIAGNOSIS — I5022 Chronic systolic (congestive) heart failure: Secondary | ICD-10-CM | POA: Diagnosis not present

## 2020-10-28 DIAGNOSIS — K256 Chronic or unspecified gastric ulcer with both hemorrhage and perforation: Secondary | ICD-10-CM | POA: Diagnosis not present

## 2020-10-28 DIAGNOSIS — D509 Iron deficiency anemia, unspecified: Secondary | ICD-10-CM | POA: Diagnosis not present

## 2020-11-05 ENCOUNTER — Telehealth: Payer: Self-pay | Admitting: Oncology

## 2020-11-05 DIAGNOSIS — K256 Chronic or unspecified gastric ulcer with both hemorrhage and perforation: Secondary | ICD-10-CM | POA: Diagnosis not present

## 2020-11-05 DIAGNOSIS — I428 Other cardiomyopathies: Secondary | ICD-10-CM | POA: Diagnosis not present

## 2020-11-05 DIAGNOSIS — N183 Chronic kidney disease, stage 3 unspecified: Secondary | ICD-10-CM | POA: Diagnosis not present

## 2020-11-05 DIAGNOSIS — I5022 Chronic systolic (congestive) heart failure: Secondary | ICD-10-CM | POA: Diagnosis not present

## 2020-11-05 DIAGNOSIS — I129 Hypertensive chronic kidney disease with stage 1 through stage 4 chronic kidney disease, or unspecified chronic kidney disease: Secondary | ICD-10-CM | POA: Diagnosis not present

## 2020-11-05 DIAGNOSIS — D509 Iron deficiency anemia, unspecified: Secondary | ICD-10-CM | POA: Diagnosis not present

## 2020-11-05 DIAGNOSIS — E7800 Pure hypercholesterolemia, unspecified: Secondary | ICD-10-CM | POA: Diagnosis not present

## 2020-11-05 DIAGNOSIS — I480 Paroxysmal atrial fibrillation: Secondary | ICD-10-CM | POA: Diagnosis not present

## 2020-11-05 DIAGNOSIS — E1122 Type 2 diabetes mellitus with diabetic chronic kidney disease: Secondary | ICD-10-CM | POA: Diagnosis not present

## 2020-11-05 NOTE — Telephone Encounter (Signed)
Patient called to check on next Appt - March

## 2020-11-07 DIAGNOSIS — D631 Anemia in chronic kidney disease: Secondary | ICD-10-CM | POA: Diagnosis not present

## 2020-11-07 DIAGNOSIS — K256 Chronic or unspecified gastric ulcer with both hemorrhage and perforation: Secondary | ICD-10-CM | POA: Diagnosis not present

## 2020-11-07 DIAGNOSIS — D509 Iron deficiency anemia, unspecified: Secondary | ICD-10-CM | POA: Diagnosis not present

## 2020-11-07 DIAGNOSIS — N184 Chronic kidney disease, stage 4 (severe): Secondary | ICD-10-CM | POA: Diagnosis not present

## 2020-11-07 DIAGNOSIS — E1122 Type 2 diabetes mellitus with diabetic chronic kidney disease: Secondary | ICD-10-CM | POA: Diagnosis not present

## 2020-11-07 DIAGNOSIS — K922 Gastrointestinal hemorrhage, unspecified: Secondary | ICD-10-CM | POA: Diagnosis not present

## 2020-11-07 DIAGNOSIS — I5023 Acute on chronic systolic (congestive) heart failure: Secondary | ICD-10-CM | POA: Diagnosis not present

## 2020-11-07 DIAGNOSIS — I130 Hypertensive heart and chronic kidney disease with heart failure and stage 1 through stage 4 chronic kidney disease, or unspecified chronic kidney disease: Secondary | ICD-10-CM | POA: Diagnosis not present

## 2020-11-07 DIAGNOSIS — E875 Hyperkalemia: Secondary | ICD-10-CM | POA: Diagnosis not present

## 2020-11-11 ENCOUNTER — Encounter: Payer: Self-pay | Admitting: Family Medicine

## 2020-11-11 DIAGNOSIS — E1122 Type 2 diabetes mellitus with diabetic chronic kidney disease: Secondary | ICD-10-CM | POA: Diagnosis not present

## 2020-11-11 DIAGNOSIS — D631 Anemia in chronic kidney disease: Secondary | ICD-10-CM | POA: Diagnosis not present

## 2020-11-11 DIAGNOSIS — K922 Gastrointestinal hemorrhage, unspecified: Secondary | ICD-10-CM | POA: Diagnosis not present

## 2020-11-11 DIAGNOSIS — D509 Iron deficiency anemia, unspecified: Secondary | ICD-10-CM | POA: Diagnosis not present

## 2020-11-11 DIAGNOSIS — N184 Chronic kidney disease, stage 4 (severe): Secondary | ICD-10-CM | POA: Diagnosis not present

## 2020-11-11 DIAGNOSIS — I130 Hypertensive heart and chronic kidney disease with heart failure and stage 1 through stage 4 chronic kidney disease, or unspecified chronic kidney disease: Secondary | ICD-10-CM | POA: Diagnosis not present

## 2020-11-11 DIAGNOSIS — K256 Chronic or unspecified gastric ulcer with both hemorrhage and perforation: Secondary | ICD-10-CM | POA: Diagnosis not present

## 2020-11-11 DIAGNOSIS — E875 Hyperkalemia: Secondary | ICD-10-CM | POA: Diagnosis not present

## 2020-11-11 DIAGNOSIS — I5023 Acute on chronic systolic (congestive) heart failure: Secondary | ICD-10-CM | POA: Diagnosis not present

## 2020-11-15 ENCOUNTER — Telehealth: Payer: Self-pay

## 2020-11-15 NOTE — Progress Notes (Signed)
Chronic Care Management Pharmacy Assistant   Name: [name]  MRN: [id number] DOB: [birth year]  Reason for Encounter: Disease State call for hypertension  Patient Questions:  1.  Have you seen any other providers since your last visit? No  2.  Any changes in your medicines or health? 10 days ago she started with neck and back pain, she stated she has swelling on left side, she has increased depression and decreased appetite.     PCP : [name], MD  Allergies:   Allergies  Allergen Reactions  . Gabapentin Swelling  . Pregabalin Swelling       . Morphine Nausea Only  . Penicillins Rash and Other (See Comments)    Has patient had a PCN reaction causing immediate rash, facial/tongue/throat swelling, SOB or lightheadedness with hypotension: No Has patient had a PCN reaction causing severe rash involving mucus membranes or skin necrosis: No Has patient had a PCN reaction that required hospitalization: No - MD office Has patient had a PCN reaction occurring within the last 10 years: No If all of the above answers are "NO", then may proceed with Cephalosporin use.   . Tape Rash  . Ace Inhibitors Other (See Comments)    Angioedema (ALLERGY/intolerance)  . Carvedilol Other (See Comments)    Myalgias (intolerance)  . Insulin Glargine Itching  . Metoprolol Other (See Comments)    Angioedema (ALLERGY/intolerance)  . Pantoprazole Nausea Only  . Procaine Other (See Comments)    unknown  . Statins Other (See Comments)    Myalgias (intolerance)  . Lisinopril Rash         Medications: Outpatient Encounter Medications as of 11/15/2020  Medication Sig  . Accu-Chek FastClix Lancets MISC TEST BLOOD SUGAR TWICE DAILY E11.69  . Alcohol Swabs (B-D SINGLE USE SWABS REGULAR) PADS USE TWICE DAILY  E11.69  . ALPRAZolam (XANAX) 0.5 MG tablet TAKE ONE TABLET BY MOUTH EVERY NIGHT AT BEDTIME FOR INSOMNIA  . amLODipine (NORVASC) 5 MG tablet TAKE 1 TABLET EVERY DAY  . Apple  Cider Vinegar 188 MG CAPS Take 2 capsules by mouth daily.  . bumetanide (BUMEX) 1 MG tablet Take 1 tablet (1 mg total) by mouth 2 (two) times daily.  . calcium carbonate (OSCAL) 1500 (600 Ca) MG TABS tablet Take by mouth.  . digoxin (LANOXIN) 0.125 MG tablet TAKE 1/2 TABLET EVERY DAY (SUBSTITUTED FOR  DIGITEK)  . DROPLET PEN NEEDLES 31G X 5 MM MISC USE AS DIRECTED WITH LEVEMIR  . fluticasone (FLONASE) 50 MCG/ACT nasal spray USE 2 SPRAYS IN EACH NOSTRIL EVERY DAY AS NEEDED  . glucose blood (ACCU-CHEK SMARTVIEW) test strip CHECK BLOOD SUGAR TWICE DAILY  . LEVEMIR FLEXTOUCH 100 UNIT/ML FlexPen INJECT 42 UNITS SUBCUTANEOUSLY ONE TIME DAILY (Patient taking differently: 5 Units. )  . levothyroxine (SYNTHROID) 50 MCG tablet TAKE 1 TABLET EVERY DAY  . losartan (COZAAR) 100 MG tablet TAKE 1 TABLET EVERY DAY  . magnesium oxide (MAG-OX) 400 MG tablet Take 400 mg by mouth 2 (two) times daily.  . Multiple Vitamin (MULTIVITAMIN WITH MINERALS) TABS tablet Take 1 tablet by mouth daily. Centrum Silver  . nystatin cream (MYCOSTATIN)   . olopatadine (PATADAY) 0.1 % ophthalmic solution Place 1 drop into both eyes daily.  Marland Kitchen omega-3 acid ethyl esters (LOVAZA) 1 g capsule TAKE 1 CAPSULE TWICE DAILY  . OVER THE COUNTER MEDICATION Take 2 tablets by mouth daily. Takes Beet Root tablets - 2 tablets daily   . oxyCODONE-acetaminophen (PERCOCET) 7.5-325 MG tablet  Take 1 tablet by mouth every 6 (six) hours as needed (back pain.).  Marland Kitchen polyethylene glycol (MIRALAX / GLYCOLAX) 17 g packet Take 17 g by mouth daily as needed.   . pravastatin (PRAVACHOL) 10 MG tablet TAKE 1 TABLET FOUR TIMES WEEKLY  . rOPINIRole (REQUIP) 1 MG tablet TAKE 1 TABLET AT BEDTIME  . spironolactone (ALDACTONE) 25 MG tablet Take 0.5 tablets (12.5 mg total) by mouth daily.  Marland Kitchen tiZANidine (ZANAFLEX) 4 MG tablet TAKE 1 TABLET AT BEDTIME  . XARELTO 15 MG TABS tablet TAKE 1 TABLET DAILY WITH SUPPER.   Facility-Administered Encounter Medications as of 11/15/2020   Medication  . triamcinolone acetonide (KENALOG-40) injection 40 mg    Current Diagnosis: Patient Active Problem List   Diagnosis Date Noted  . Chronic kidney disease, stage 4 (severe) ([place])   . GAD (generalized anxiety disorder)   . Acute gastritis with hemorrhage 09/06/2020  . Iron deficiency anemia 09/06/2020  . Amiodarone pulmonary toxicity   . Anemia   . Arthritis   . Carotid artery occlusion   . CHF (congestive heart failure) ([place])   . Chronic a-fib ([place])   . Chronic kidney disease, stage IV (severe) ([place])   . Diabetes ([place])   . Fibromyalgia   . Full dentures   . GERD (gastroesophageal reflux disease)   . Hyperlipemia   . Hypertension   . Hypertensive heart disease with heart failure ([place])   . Hypothalamic disease ([place])   . Hypothyroidism   . LBBB (left bundle branch block)   . Occlusion and stenosis of right carotid artery   . Other persistent atrial fibrillation ([place])   . Pneumonia   . Presence of automatic (implantable) cardiac defibrillator   . PVD (peripheral vascular disease) ([place])   . RAS (renal artery stenosis) ([place])   . Renal insufficiency, mild   . Restless leg syndrome   . Type 2 diabetes mellitus with diabetic neuropathy ([place])   . Wears glasses   . Mixed hyperlipidemia 12/25/2019  . Dyslipidemia associated with type 2 diabetes mellitus ([place]) 12/25/2019  . Secondary hypothyroidism 12/25/2019  . Diabetic polyneuropathy associated with diabetes mellitus due to underlying condition ([place]) 12/01/2019  . Primary osteoarthritis of left knee 12/01/2019  . Carotid artery stenosis 09/24/2018  . NSVT (nonsustained ventricular tachycardia) ([place]) 09/18/2018  . Hallux limitus of right foot 06/27/2016  . ATRIAL FIBRILLATION 12/26/2010  . VENTRICULAR FIBRILLATION 12/26/2010  . Chronic systolic heart failure ([place]) 12/26/2010  . Automatic implantable cardioverter-defibrillator in situ 12/26/2010  . Cardiomyopathy ([place]) 12/23/2010  . ATRIOVENTRICULAR BLOCK, 3RD  DEGREE 12/23/2010  . AV BLOCK 12/23/2010  . ICD (implantable cardioverter-defibrillator), biventricular, in situ 12/23/2010  . Complete atrioventricular block ([place]) 12/23/2010  . H/O cardiovascular stress test 08/04/2010  . H/O echocardiogram 07/05/2010  . VT (ventricular tachycardia) ([place]) 06/2010   Reviewed chart prior to disease state call. Spoke with patient regarding BP  Recent Office Vitals: BP Readings from Last 3 Encounters:  10/18/20 (!) 142/43  10/11/20 (!) 132/44  09/29/20 134/60   Pulse Readings from Last 3 Encounters:  10/18/20 75  10/11/20 85  09/29/20 76    Wt Readings from Last 3 Encounters:  10/18/20 193 lb 12 oz (87.9 kg)  10/11/20 188 lb (85.3 kg)  09/29/20 182 lb 1.6 oz (82.6 kg)     Kidney Function Lab Results  Component Value Date/Time   CREATININE 1.6 (A) 09/29/2020 12:00 AM   CREATININE 1.75 (H) 09/01/2020 11:14 AM   CREATININE 2.53 (H) 08/26/2020 01:28 PM  GFR 38.32 (L) 11/30/2014 12:13 PM   GFRNONAA 27 (L) 09/01/2020 11:14 AM   GFRAA 31 (L) 09/01/2020 11:14 AM    BMP Latest Ref Rng & Units 09/29/2020 09/01/2020 08/26/2020  Glucose 65 - 99 mg/dL - 107(H) 98  BUN 4 - 21 48(A) 45(H) 63(H)  Creatinine 0.5 - 1.1 1.6(A) 1.75(H) 2.53(H)  BUN/Creat Ratio 12 - 28 - 26 25  Sodium 137 - 147 135(A) 138 133(L)  Potassium 3.4 - 5.3 4.5 4.7 5.0  Chloride 99 - 108 100 101 96  CO2 13 - 22 28(A) 23 24  Calcium 8.7 - 10.7 9.7 9.4 8.8    . Current antihypertensive regimen:   Losartan 100 mg daily spironolactone (ALDACTONE) 25 MG   . How often are you checking your Blood Pressure? daily   . Current home BP readings: 122/46 pulse 71  . What recent interventions/DTPs have been made by any provider to improve Blood Pressure control since last CPP Visit: Per patient daughter Suanne Marker, there are days she skips medication, she watches her diet.  Patient is wanting to go to hospital to be checked out.  . Any recent hospitalizations or ED visits since last  visit with CPP? No   . What diet changes have been made to improve Blood Pressure Control?  Patient watches what she eats.  . What exercise is being done to improve your Blood Pressure Control?  Patient currently not very active due to back and neck pain.  Adherence Review: Is the patient currently on ACE/ARB medication? Yes Does the patient have >5 day gap between last estimated fill dates? No   Follow-Up:  Pharmacist Review  Donette Larry, CPP notified  Clarita Leber, [place] Pharmacist Assistant [phone]

## 2020-11-16 ENCOUNTER — Telehealth: Payer: Self-pay

## 2020-11-16 DIAGNOSIS — E1122 Type 2 diabetes mellitus with diabetic chronic kidney disease: Secondary | ICD-10-CM | POA: Diagnosis not present

## 2020-11-16 DIAGNOSIS — I130 Hypertensive heart and chronic kidney disease with heart failure and stage 1 through stage 4 chronic kidney disease, or unspecified chronic kidney disease: Secondary | ICD-10-CM | POA: Diagnosis not present

## 2020-11-16 DIAGNOSIS — N184 Chronic kidney disease, stage 4 (severe): Secondary | ICD-10-CM | POA: Diagnosis not present

## 2020-11-16 DIAGNOSIS — K922 Gastrointestinal hemorrhage, unspecified: Secondary | ICD-10-CM | POA: Diagnosis not present

## 2020-11-16 DIAGNOSIS — D509 Iron deficiency anemia, unspecified: Secondary | ICD-10-CM | POA: Diagnosis not present

## 2020-11-16 DIAGNOSIS — K256 Chronic or unspecified gastric ulcer with both hemorrhage and perforation: Secondary | ICD-10-CM | POA: Diagnosis not present

## 2020-11-16 DIAGNOSIS — I5023 Acute on chronic systolic (congestive) heart failure: Secondary | ICD-10-CM | POA: Diagnosis not present

## 2020-11-16 DIAGNOSIS — D631 Anemia in chronic kidney disease: Secondary | ICD-10-CM | POA: Diagnosis not present

## 2020-11-16 DIAGNOSIS — E875 Hyperkalemia: Secondary | ICD-10-CM | POA: Diagnosis not present

## 2020-11-16 NOTE — Telephone Encounter (Signed)
[name] called with concerns after her visit with [name].  [name] is experiencing swelling of her lower legs, abdomen and arms. Her lung sounds are diminished and she is experiencing pain in her back.  The nurse is also concerned that she has increasing depression since the death of her son.  I called [name] back after discussion with Dr. Tobie Poet and she advised follow-up in the office.  [name] is going to talk with her son and call us back for an appointment later this week.

## 2020-11-18 ENCOUNTER — Ambulatory Visit: Payer: Medicare HMO | Admitting: Family Medicine

## 2020-11-19 DIAGNOSIS — D509 Iron deficiency anemia, unspecified: Secondary | ICD-10-CM | POA: Diagnosis not present

## 2020-11-19 DIAGNOSIS — K256 Chronic or unspecified gastric ulcer with both hemorrhage and perforation: Secondary | ICD-10-CM | POA: Diagnosis not present

## 2020-11-19 DIAGNOSIS — E1122 Type 2 diabetes mellitus with diabetic chronic kidney disease: Secondary | ICD-10-CM | POA: Diagnosis not present

## 2020-11-22 ENCOUNTER — Telehealth: Payer: Self-pay

## 2020-11-22 ENCOUNTER — Other Ambulatory Visit: Payer: Self-pay

## 2020-11-22 MED ORDER — OXYCODONE-ACETAMINOPHEN 7.5-325 MG PO TABS: 1.0000 | ORAL_TABLET | Freq: Four times a day (QID) | ORAL | 0 refills | Status: DC | PRN

## 2020-11-22 NOTE — Telephone Encounter (Signed)
Joe called to report that he is very concerned about his Mother.  She was scheduled to be seen last week but she refused to come.  He thinks she is depressed since the passing of his brother.  She has been rescheduled for tomorrow.

## 2020-11-23 ENCOUNTER — Ambulatory Visit (INDEPENDENT_AMBULATORY_CARE_PROVIDER_SITE_OTHER): Payer: Medicare HMO | Admitting: Family Medicine

## 2020-11-23 ENCOUNTER — Encounter: Payer: Self-pay | Admitting: Family Medicine

## 2020-11-23 ENCOUNTER — Other Ambulatory Visit: Payer: Self-pay

## 2020-11-23 VITALS — BP 132/68 | HR 73 | Temp 97.1°F

## 2020-11-23 DIAGNOSIS — M549 Dorsalgia, unspecified: Secondary | ICD-10-CM | POA: Diagnosis not present

## 2020-11-23 DIAGNOSIS — D500 Iron deficiency anemia secondary to blood loss (chronic): Secondary | ICD-10-CM | POA: Diagnosis not present

## 2020-11-23 DIAGNOSIS — R609 Edema, unspecified: Secondary | ICD-10-CM | POA: Diagnosis not present

## 2020-11-23 DIAGNOSIS — D62 Acute posthemorrhagic anemia: Secondary | ICD-10-CM | POA: Diagnosis not present

## 2020-11-23 DIAGNOSIS — K922 Gastrointestinal hemorrhage, unspecified: Secondary | ICD-10-CM | POA: Diagnosis not present

## 2020-11-23 DIAGNOSIS — I517 Cardiomegaly: Secondary | ICD-10-CM | POA: Diagnosis not present

## 2020-11-23 DIAGNOSIS — I482 Chronic atrial fibrillation, unspecified: Secondary | ICD-10-CM | POA: Diagnosis not present

## 2020-11-23 DIAGNOSIS — R5383 Other fatigue: Secondary | ICD-10-CM | POA: Diagnosis not present

## 2020-11-23 DIAGNOSIS — I130 Hypertensive heart and chronic kidney disease with heart failure and stage 1 through stage 4 chronic kidney disease, or unspecified chronic kidney disease: Secondary | ICD-10-CM | POA: Diagnosis not present

## 2020-11-23 DIAGNOSIS — Z8709 Personal history of other diseases of the respiratory system: Secondary | ICD-10-CM | POA: Diagnosis not present

## 2020-11-23 DIAGNOSIS — K449 Diaphragmatic hernia without obstruction or gangrene: Secondary | ICD-10-CM | POA: Diagnosis not present

## 2020-11-23 DIAGNOSIS — Z539 Procedure and treatment not carried out, unspecified reason: Secondary | ICD-10-CM | POA: Diagnosis not present

## 2020-11-23 DIAGNOSIS — R531 Weakness: Secondary | ICD-10-CM

## 2020-11-23 DIAGNOSIS — K259 Gastric ulcer, unspecified as acute or chronic, without hemorrhage or perforation: Secondary | ICD-10-CM | POA: Diagnosis not present

## 2020-11-23 DIAGNOSIS — I5022 Chronic systolic (congestive) heart failure: Secondary | ICD-10-CM | POA: Diagnosis not present

## 2020-11-23 DIAGNOSIS — F322 Major depressive disorder, single episode, severe without psychotic features: Secondary | ICD-10-CM | POA: Diagnosis not present

## 2020-11-23 DIAGNOSIS — N184 Chronic kidney disease, stage 4 (severe): Secondary | ICD-10-CM | POA: Diagnosis not present

## 2020-11-23 DIAGNOSIS — N179 Acute kidney failure, unspecified: Secondary | ICD-10-CM | POA: Diagnosis not present

## 2020-11-23 DIAGNOSIS — J9811 Atelectasis: Secondary | ICD-10-CM | POA: Diagnosis not present

## 2020-11-23 DIAGNOSIS — I4821 Permanent atrial fibrillation: Secondary | ICD-10-CM | POA: Diagnosis not present

## 2020-11-23 DIAGNOSIS — Z8719 Personal history of other diseases of the digestive system: Secondary | ICD-10-CM | POA: Diagnosis not present

## 2020-11-23 DIAGNOSIS — D649 Anemia, unspecified: Secondary | ICD-10-CM | POA: Diagnosis not present

## 2020-11-23 DIAGNOSIS — E875 Hyperkalemia: Secondary | ICD-10-CM | POA: Diagnosis not present

## 2020-11-23 DIAGNOSIS — J90 Pleural effusion, not elsewhere classified: Secondary | ICD-10-CM | POA: Diagnosis not present

## 2020-11-23 DIAGNOSIS — K2950 Unspecified chronic gastritis without bleeding: Secondary | ICD-10-CM | POA: Diagnosis not present

## 2020-11-23 DIAGNOSIS — F32A Depression, unspecified: Secondary | ICD-10-CM | POA: Diagnosis not present

## 2020-11-23 DIAGNOSIS — R195 Other fecal abnormalities: Secondary | ICD-10-CM | POA: Diagnosis not present

## 2020-11-23 DIAGNOSIS — E1140 Type 2 diabetes mellitus with diabetic neuropathy, unspecified: Secondary | ICD-10-CM | POA: Diagnosis not present

## 2020-11-23 DIAGNOSIS — K254 Chronic or unspecified gastric ulcer with hemorrhage: Secondary | ICD-10-CM | POA: Diagnosis not present

## 2020-11-23 DIAGNOSIS — Z86711 Personal history of pulmonary embolism: Secondary | ICD-10-CM | POA: Diagnosis not present

## 2020-11-23 DIAGNOSIS — N170 Acute kidney failure with tubular necrosis: Secondary | ICD-10-CM | POA: Diagnosis not present

## 2020-11-23 DIAGNOSIS — R600 Localized edema: Secondary | ICD-10-CM | POA: Diagnosis not present

## 2020-11-23 DIAGNOSIS — K269 Duodenal ulcer, unspecified as acute or chronic, without hemorrhage or perforation: Secondary | ICD-10-CM | POA: Diagnosis not present

## 2020-11-23 NOTE — Progress Notes (Signed)
Subjective:  Patient ID: [name], female    DOB: [birth year]  Age: 87 y.o. MRN: [id number]  Chief Complaint  Patient presents with  . Weakness    HPI Patient presents complaining of severe fatigue and weakness.  Patient has an extensive cardiac history - see PMHx. The weakness has been going on for anywhere from 2 to 4 weeks.  She had a GI bleed in November 2021.  She was admitted to [facility] and had a GI work-up.  Patient also complains of increased swelling particularly on her left side more than her right. Denies chest pain or orthopnea.   Diabetes: Sugars running 140s. Checks sugars twice a day.  Patient is adjusting her levemir insulin as she wishes based on her sugars. There is no set SSI that I ever gave her. She is taking anywhere from levemir 20- 30 U once daily. . Low sugars: Has had a couple in the last 2 weeks less than 60.   She has had a significant number of losses in the last 2 months.  Son passed away in 2023/10/22 states she feels depressed. Good friend passed away. And sister in law just passed away. Patient is sleeping > 8 hours per night. Having back/neck pain. Taking percocet 3-4 times per day. Has some relief. She has poor appetite, anhedonia, depressed, feels either restless or sluggish, but is able to concentrate. No suicidal thoughts.   Although pt has definitely has depression, she is too weak to even get out of her wheelchair. It required 1 people to get her in a wheelchair. 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 She denies weakness on one side.  Current Outpatient Medications on File Prior to Visit  Medication Sig Dispense Refill  . Accu-Chek FastClix Lancets MISC TEST BLOOD SUGAR TWICE DAILY E11.69 204 each 3  . Alcohol Swabs (B-D SINGLE USE SWABS REGULAR) PADS USE TWICE DAILY  E11.69 200 each 3  . ALPRAZolam (XANAX) 0.5 MG tablet TAKE ONE TABLET BY MOUTH EVERY NIGHT AT BEDTIME  FOR INSOMNIA 90 tablet 0  . amLODipine (NORVASC) 5 MG tablet TAKE 1 TABLET EVERY DAY 90 tablet 1  . Apple Cider Vinegar 188 MG CAPS Take 2 capsules by mouth daily.    . bumetanide (BUMEX) 1 MG tablet Take 1 tablet (1 mg total) by mouth 2 (two) times daily. 180 tablet 3  . calcium carbonate (OSCAL) 1500 (600 Ca) MG TABS tablet Take by mouth.    . digoxin (LANOXIN) 0.125 MG tablet TAKE 1/2 TABLET EVERY DAY (SUBSTITUTED FOR  DIGITEK) 45 tablet 1  . DROPLET PEN NEEDLES 31G X 5 MM MISC USE AS DIRECTED WITH LEVEMIR 300 each 3  . fluticasone (FLONASE) 50 MCG/ACT nasal spray USE 2 SPRAYS IN EACH NOSTRIL EVERY DAY AS NEEDED 48 g 3  . glucose blood (ACCU-CHEK SMARTVIEW) test strip CHECK BLOOD SUGAR TWICE DAILY 200 strip 3  . LEVEMIR FLEXTOUCH 100 UNIT/ML FlexPen INJECT 42 UNITS SUBCUTANEOUSLY ONE TIME DAILY (Patient taking differently: 5 Units. ) 45 mL 1  . levothyroxine (SYNTHROID) 50 MCG tablet TAKE 1 TABLET EVERY DAY 90 tablet 1  . losartan (COZAAR) 100 MG tablet TAKE 1 TABLET EVERY DAY 90 tablet 1  . magnesium oxide (MAG-OX) 400 MG tablet Take 400 mg by mouth 2 (two) times daily.    . Multiple Vitamin (MULTIVITAMIN WITH MINERALS) TABS tablet Take 1 tablet by mouth daily. Centrum Silver    .  nystatin cream (MYCOSTATIN)     . olopatadine (PATADAY) 0.1 % ophthalmic solution Place 1 drop into both eyes daily.    Marland Kitchen omega-3 acid ethyl esters (LOVAZA) 1 g capsule TAKE 1 CAPSULE TWICE DAILY 180 capsule 1  . OVER THE COUNTER MEDICATION Take 2 tablets by mouth daily. Takes Beet Root tablets - 2 tablets daily     . oxyCODONE-acetaminophen (PERCOCET) 7.5-325 MG tablet Take 1 tablet by mouth every 6 (six) hours as needed (back pain.). 120 tablet 0  . polyethylene glycol (MIRALAX / GLYCOLAX) 17 g packet Take 17 g by mouth daily as needed.     . pravastatin (PRAVACHOL) 10 MG tablet TAKE 1 TABLET FOUR TIMES WEEKLY 48 tablet 1  . rOPINIRole (REQUIP) 1 MG tablet TAKE 1 TABLET AT BEDTIME 90 tablet 2  . spironolactone  (ALDACTONE) 25 MG tablet Take 0.5 tablets (12.5 mg total) by mouth daily. 45 tablet 1  . tiZANidine (ZANAFLEX) 4 MG tablet TAKE 1 TABLET AT BEDTIME 90 tablet 0  . XARELTO 15 MG TABS tablet TAKE 1 TABLET DAILY WITH SUPPER. 90 tablet 3   Current Facility-Administered Medications on File Prior to Visit  Medication Dose Route Frequency Provider Last Rate Last Admin  . triamcinolone acetonide (KENALOG-40) injection 40 mg  40 mg Intra-articular Once [name], MD       Past Medical History:  Diagnosis Date  . Amiodarone pulmonary toxicity   . Anemia    no GI bleeding; on iron  . Arthritis   . Atrial fibrillation ([place])   . Biventricular ICD (implantable cardiac defibrillator) in place    BiV ICD for nonischemic cardiomyopathy; BiV responder  . Carotid artery occlusion   . CHF (congestive heart failure) ([place])   . Chronic a-fib ([place])    on xarelto; failed DCCV  . Chronic kidney disease, stage 4 (severe) ([place])   . Diabetes ([place])   . Fibromyalgia   . Full dentures   . GAD (generalized anxiety disorder)   . GERD (gastroesophageal reflux disease)   . H/O cardiovascular stress test 08/04/2010   normal imaging; EF 61%  . H/O echocardiogram 07/05/2010   EF 45-50%; aortic valve-mildly sclerotic; LA-mod dilaterd   . Hyperlipemia   . Hypertension   . Hypertensive heart disease with heart failure ([place])   . Hypothalamic disease ([place])    on thyroid supplement  . Hypothyroidism   . Hypothyroidism   . LBBB (left bundle branch block)   . Mixed hyperlipidemia   . Occlusion and stenosis of right carotid artery   . Other persistent atrial fibrillation ([place])   . Pneumonia   . Presence of automatic (implantable) cardiac defibrillator   . PVD (peripheral vascular disease) ([place])    L RA stent 1994  . RAS (renal artery stenosis) ([place])    a. renal dopp (12/18/08)- Right RA-<60%; L RA stent- open; nl size and shape in both kidneys;  b.  Rena Artery [name] (3/16):  SMA with > 70% stenosis, Bilateral prox  RA with 1-59%  . Renal insufficiency, mild    05/01/12 Creat 1.47  . Restless leg syndrome   . Type 2 diabetes mellitus with diabetic neuropathy ([place])   . VT (ventricular tachycardia) ([place]) 9/11   polymorphic VT probably related to sotalol therapy  . Wears glasses    Past Surgical History:  Procedure Laterality Date  . ABDOMINAL HYSTERECTOMY    . APPENDECTOMY    . AV NODE ABLATION  06/08/2006   performed by Dr. Beckie Salts;  due to Afib with RVR and tachy brady syndrome  . BIV ICD GENERATOR CHANGEOUT N/A 12/31/2017   Procedure: BIV ICD GENERATOR CHANGEOUT;  Surgeon: Evans Lance, MD;  Location: [facility];  Service: Cardiovascular;  Laterality: N/A;  . BIV ICD GENERTAOR CHANGE OUT  11/15/10   Medtronic Protecta [id number] serial #[id number]  . BiV ICD Placement  06/17/2007; 04/20/14   Medtronic Concerta [id number]; RA lead-Med 5076/53cm, [id number]; RV lead- SJM 7121/65cm, [id number]; LV lead- Med 4194/88cm, [id number]; gen change 04/2014 by Dr Lovena Le  . BIV PACEMAKER GENERATOR CHANGE OUT N/A 04/20/2014   Procedure: BIV PACEMAKER GENERATOR CHANGE OUT;  Surgeon: Evans Lance, MD;  Location: [facility];  Service: Cardiovascular;  Laterality: N/A;  . BRAIN MENINGIOMA EXCISION  2011   L frontal meningioma at [facility]   . BREAST SURGERY     lumpectomy right  . CARDIOVERSION  07/21/05   converted to sinus brady  . CAROTID ENDARTERECTOMY Right 09/24/2018   with bovine pericardial patch angioplasty  . CATARACT EXTRACTION W/ INTRAOCULAR LENS  IMPLANT, BILATERAL    . CHOLECYSTECTOMY     Dr. Sherald Hess in [place] and Dr. Lyda Jester follows; surgical stricture post procedure with stent placement  . COLONOSCOPY W/ BIOPSIES AND POLYPECTOMY    . DILATION AND CURETTAGE OF UTERUS    . ENDARTERECTOMY Right 09/24/2018   Procedure: ENDARTERECTOMY CAROTID RIGHT;  Surgeon: Angelia Mould, MD;  Location: [facility];  Service: Vascular;  Laterality: Right;  . PV Angio  1994   L Renal artery stent      Family History  Problem Relation Age of Onset  . Heart failure Mother   . Hypertension Mother   . Cancer Father        lung  . Dementia Sister   . Cancer Sister        pancreatic, renal and thyroid  . Heart attack Brother   . AAA (abdominal aortic aneurysm) Brother   . Stroke Maternal Grandmother   . Diabetes Other   . Stroke Son    Social History   Socioeconomic History  . Marital status: Married    Spouse name: Not on file  . Number of children: 6  . Years of education: Not on file  . Highest education level: Not on file  Occupational History  . Not on file  Tobacco Use  . Smoking status: Never Smoker  . Smokeless tobacco: Never Used  Vaping Use  . Vaping Use: Never used  Substance and Sexual Activity  . Alcohol use: No    Alcohol/week: 0.0 standard drinks  . Drug use: No  . Sexual activity: Not Currently  Other Topics Concern  . Not on file  Social History Narrative   wears sunscreen, brushes and flosses daily, see's dentist bi-annually, has smoke/carbon monoxide detectors, wears a seatbelt and practices gun safety   Social Determinants of Health   Financial Resource Strain: Not on file  Food Insecurity: No Food Insecurity  . Worried About Charity fundraiser in the Last Year: Never true  . Ran Out of Food in the Last Year: Never true  Transportation Needs: No Transportation Needs  . Lack of Transportation (Medical): No  . Lack of Transportation (Non-Medical): No  Physical Activity: Not on file  Stress: Not on file  Social Connections: Not on file    Review of Systems  Constitutional: Positive for fatigue. Negative for chills and fever.  HENT: Negative for congestion, ear pain and sore throat.  Respiratory: Negative for cough and shortness of breath.   Cardiovascular: Negative for chest pain.  Gastrointestinal: Negative for abdominal pain, constipation, diarrhea, nausea and vomiting.  Musculoskeletal: Positive for back pain and neck pain (Posterior x  1 week).  Neurological: Positive for weakness. Negative for headaches.    Objective:  BP 132/68   Pulse 73   Temp (!) 97.1 F (36.2 C)   SpO2 100%   BP/Weight 11/23/2020 06/07/9406 03/16/880  Systolic BP 103 159 458  Diastolic BP 68 43 44  Wt. (Lbs) - 193.75 188  BMI - 29.46 28.59    Physical Exam Vitals reviewed.  Constitutional:      General: She is not in acute distress.    Appearance: She is obese.     Comments: Pale and perhaps even a little jaundiced.  Cardiovascular:     Rate and Rhythm: Rhythm irregular.     Heart sounds: Murmur (4/6) heard.    Pulmonary:     Effort: Pulmonary effort is normal.     Breath sounds: Normal breath sounds.  Abdominal:     Palpations: Abdomen is soft.     Tenderness: There is no abdominal tenderness.  Musculoskeletal:     Right lower leg: Edema (1 +) present.     Left lower leg: Edema (3+) present.  Neurological:     Mental Status: She is alert and oriented to person, place, and time.  Psychiatric:        Mood and Affect: Mood normal.        Behavior: Behavior normal.     Diabetic Foot Exam - Simple   No data filed      Lab Results  Component Value Date   WBC 5.4 09/29/2020   HGB 8.0 (A) 09/29/2020   HCT 25 (A) 09/29/2020   PLT 381 09/29/2020   GLUCOSE 107 (H) 09/01/2020   CHOL 99 (L) 08/26/2020   TRIG 78 08/26/2020   HDL 31 (L) 08/26/2020   LDLCALC 52 08/26/2020   ALT 18 09/29/2020   AST 35 09/29/2020   NA 135 (A) 09/29/2020   K 4.5 09/29/2020   CL 100 09/29/2020   CREATININE 1.6 (A) 09/29/2020   BUN 48 (A) 09/29/2020   CO2 28 (A) 09/29/2020   TSH 2.440 05/25/2020   INR 0.96 09/24/2018   HGBA1C 5.7 (H) 08/26/2020   MICROALBUR 10 12/25/2019      Assessment & Plan:   1. Other fatigue 2. Weakness 3. Chronic kidney disease, stage IV (severe) ([place]) 4. Type 2 diabetes mellitus with diabetic neuropathy, unspecified whether long term insulin use ([place]) 5. Chronic atrial fibrillation (H[facility]) 6. Depression,  major, single episode, severe ([place])   Sending by EMS to [facility] for evaluation.  Pt is severely weak.   Glucose 107.   Follow-up: No follow-ups on file.  An After Visit Summary was printed and given to the patient.  [name], MD [facility] [phone]

## 2020-11-24 DIAGNOSIS — K922 Gastrointestinal hemorrhage, unspecified: Secondary | ICD-10-CM | POA: Diagnosis not present

## 2020-11-24 DIAGNOSIS — Z539 Procedure and treatment not carried out, unspecified reason: Secondary | ICD-10-CM | POA: Diagnosis not present

## 2020-11-24 DIAGNOSIS — R195 Other fecal abnormalities: Secondary | ICD-10-CM | POA: Diagnosis not present

## 2020-11-24 DIAGNOSIS — D62 Acute posthemorrhagic anemia: Secondary | ICD-10-CM | POA: Diagnosis not present

## 2020-11-24 DIAGNOSIS — D649 Anemia, unspecified: Secondary | ICD-10-CM | POA: Diagnosis not present

## 2020-11-24 DIAGNOSIS — K269 Duodenal ulcer, unspecified as acute or chronic, without hemorrhage or perforation: Secondary | ICD-10-CM | POA: Diagnosis not present

## 2020-11-24 DIAGNOSIS — K254 Chronic or unspecified gastric ulcer with hemorrhage: Secondary | ICD-10-CM | POA: Diagnosis not present

## 2020-11-24 DIAGNOSIS — K259 Gastric ulcer, unspecified as acute or chronic, without hemorrhage or perforation: Secondary | ICD-10-CM | POA: Diagnosis not present

## 2020-11-24 DIAGNOSIS — N184 Chronic kidney disease, stage 4 (severe): Secondary | ICD-10-CM | POA: Diagnosis not present

## 2020-11-25 DIAGNOSIS — Z8719 Personal history of other diseases of the digestive system: Secondary | ICD-10-CM | POA: Diagnosis not present

## 2020-11-25 DIAGNOSIS — Z8709 Personal history of other diseases of the respiratory system: Secondary | ICD-10-CM | POA: Diagnosis not present

## 2020-11-25 DIAGNOSIS — Z86711 Personal history of pulmonary embolism: Secondary | ICD-10-CM | POA: Diagnosis not present

## 2020-11-25 DIAGNOSIS — R600 Localized edema: Secondary | ICD-10-CM | POA: Diagnosis not present

## 2020-11-26 ENCOUNTER — Other Ambulatory Visit: Payer: Self-pay | Admitting: Family Medicine

## 2020-11-26 DIAGNOSIS — K254 Chronic or unspecified gastric ulcer with hemorrhage: Secondary | ICD-10-CM | POA: Diagnosis not present

## 2020-11-26 DIAGNOSIS — D62 Acute posthemorrhagic anemia: Secondary | ICD-10-CM | POA: Diagnosis not present

## 2020-11-26 DIAGNOSIS — N184 Chronic kidney disease, stage 4 (severe): Secondary | ICD-10-CM | POA: Diagnosis not present

## 2020-11-28 ENCOUNTER — Other Ambulatory Visit: Payer: Self-pay | Admitting: Family Medicine

## 2020-11-29 ENCOUNTER — Telehealth: Payer: Self-pay

## 2020-11-29 ENCOUNTER — Other Ambulatory Visit: Payer: Self-pay | Admitting: Family Medicine

## 2020-11-29 DIAGNOSIS — N184 Chronic kidney disease, stage 4 (severe): Secondary | ICD-10-CM | POA: Diagnosis not present

## 2020-11-29 DIAGNOSIS — E875 Hyperkalemia: Secondary | ICD-10-CM | POA: Diagnosis not present

## 2020-11-29 DIAGNOSIS — E1122 Type 2 diabetes mellitus with diabetic chronic kidney disease: Secondary | ICD-10-CM | POA: Diagnosis not present

## 2020-11-29 DIAGNOSIS — D509 Iron deficiency anemia, unspecified: Secondary | ICD-10-CM | POA: Diagnosis not present

## 2020-11-29 DIAGNOSIS — I5023 Acute on chronic systolic (congestive) heart failure: Secondary | ICD-10-CM | POA: Diagnosis not present

## 2020-11-29 DIAGNOSIS — K256 Chronic or unspecified gastric ulcer with both hemorrhage and perforation: Secondary | ICD-10-CM | POA: Diagnosis not present

## 2020-11-29 DIAGNOSIS — D631 Anemia in chronic kidney disease: Secondary | ICD-10-CM | POA: Diagnosis not present

## 2020-11-29 DIAGNOSIS — I130 Hypertensive heart and chronic kidney disease with heart failure and stage 1 through stage 4 chronic kidney disease, or unspecified chronic kidney disease: Secondary | ICD-10-CM | POA: Diagnosis not present

## 2020-11-29 DIAGNOSIS — K922 Gastrointestinal hemorrhage, unspecified: Secondary | ICD-10-CM | POA: Diagnosis not present

## 2020-11-29 MED ORDER — DULOXETINE HCL 20 MG PO CPEP: 20.0000 mg | ORAL_CAPSULE | Freq: Every day | ORAL | 0 refills | Status: DC

## 2020-11-29 NOTE — Telephone Encounter (Signed)
      Patient returned call.  She states that she had a good night rest last night and is feeling much stronger.  [name] stated that she has lost most of the fluid and is not near as swollen as she was when she went to the hospital.  She talked to the home health nurse and said she may want to get something for her nerves.  She is going to resume her Xarelto tonight as recommended at discharge.  Her aspirin was discontinued at the hospital.  She has an appointment this Friday, 2/25 with Dr Tobie Poet and is aware.  She agreed to call us if she has any concerns before her appointment.   Shelle Iron, LPN 98/42/10 3:12 PM

## 2020-11-29 NOTE — Telephone Encounter (Signed)
  Transition Care Management Follow-up Telephone Call  Arayah Krouse May 08, 1934  Admit Date: 11/23/2020 Discharge Date: 11/26/2020 Diagnoses: Anemia, Hyperkalemia    2 day post discharge: 11/28/20 7 day post discharge: 12/03/20 14 day post discharge: 12/09/20  Caleb Popp Mariska Daffin was discharged from [facility] on 11/26/20 with the diagnoses listed above.  She was contacted today via telephone in regards to transition of care.  I was unable to reach her my first attempt.  She needs a one week hospital follow-up appointment.  She was admitted to the hospital for weakness and worsening edema (2+ in bilateral upper extremities, worse on the left).  She has a history of upper GI bleed and is on Xarelto for AFIB.  Kenia was also noted to have acute on chronic renal failure with associated hyperkalemia.  During her admission her Aspirin was discontinued and her Xarelto was held.  She is scheduled to restart it today, 11/29/20.  On Feb 16 she had an EGD showing nonbleeding ulcers.  She was scheduled for a colonoscopy at that time as well however she had poor prep.  Normal brown stool was visualized and procedure was terminated.  BM on 2/17 was brown with no evidence of bleeding.    HGB was 4.5 at time of admission.  She was transfused with 3 units of type A Positive blood and discharge HGB was 7.7.    Doppler was done to rule out DVT for significant left upper extremity swelling was NEGATIVE.  Edema had improved at discharge.  Home health will resume weekly visits.   Discharge Instructions: follow-up with PCP in one week.  STOP ASA, Restart Xarelto 2/21  Items Reviewed:  Medications reviewed: yes  Allergies reviewed: yes  Dietary changes reviewed: yes  Referrals reviewed: yes   Confirmed importance and date/time of follow-up visits scheduled no - unable to reach patient  Provider Appointment booked with   Shelle Iron, LPN 56/97/94 8:01 AM

## 2020-11-29 NOTE — Telephone Encounter (Signed)
[name] is feeling anxious and not resting well.  She is scheduled to be seen later in the week with Dr.Cox.  Dr. Tobie Poet sent medication to the pharmacy.

## 2020-11-29 NOTE — Telephone Encounter (Signed)
Home Health called after doing reconciliation visit w/ pt. Stating their schedule would be  1 week one 1 week one 2 weeks two  Also stated the pt is very depressed.   Harrell Lark 11/29/20 4:41 PM

## 2020-11-29 NOTE — Telephone Encounter (Signed)
Baird Lyons with [facility] left VM stating pt denied visit this weekend and needs ok to go today.   Returned call. Gave ok. Nurse states she will resume schedule. Which is once a week.   Royce Macadamia, [place] 11/29/20 9:52 AM

## 2020-12-01 DIAGNOSIS — E1122 Type 2 diabetes mellitus with diabetic chronic kidney disease: Secondary | ICD-10-CM | POA: Diagnosis not present

## 2020-12-01 DIAGNOSIS — D631 Anemia in chronic kidney disease: Secondary | ICD-10-CM | POA: Diagnosis not present

## 2020-12-01 DIAGNOSIS — N184 Chronic kidney disease, stage 4 (severe): Secondary | ICD-10-CM | POA: Diagnosis not present

## 2020-12-01 DIAGNOSIS — I130 Hypertensive heart and chronic kidney disease with heart failure and stage 1 through stage 4 chronic kidney disease, or unspecified chronic kidney disease: Secondary | ICD-10-CM | POA: Diagnosis not present

## 2020-12-01 DIAGNOSIS — I5023 Acute on chronic systolic (congestive) heart failure: Secondary | ICD-10-CM | POA: Diagnosis not present

## 2020-12-01 DIAGNOSIS — K256 Chronic or unspecified gastric ulcer with both hemorrhage and perforation: Secondary | ICD-10-CM | POA: Diagnosis not present

## 2020-12-01 DIAGNOSIS — D509 Iron deficiency anemia, unspecified: Secondary | ICD-10-CM | POA: Diagnosis not present

## 2020-12-01 DIAGNOSIS — E875 Hyperkalemia: Secondary | ICD-10-CM | POA: Diagnosis not present

## 2020-12-01 DIAGNOSIS — K922 Gastrointestinal hemorrhage, unspecified: Secondary | ICD-10-CM | POA: Diagnosis not present

## 2020-12-02 DIAGNOSIS — E1122 Type 2 diabetes mellitus with diabetic chronic kidney disease: Secondary | ICD-10-CM | POA: Diagnosis not present

## 2020-12-02 DIAGNOSIS — I5023 Acute on chronic systolic (congestive) heart failure: Secondary | ICD-10-CM | POA: Diagnosis not present

## 2020-12-02 DIAGNOSIS — I130 Hypertensive heart and chronic kidney disease with heart failure and stage 1 through stage 4 chronic kidney disease, or unspecified chronic kidney disease: Secondary | ICD-10-CM | POA: Diagnosis not present

## 2020-12-02 DIAGNOSIS — K922 Gastrointestinal hemorrhage, unspecified: Secondary | ICD-10-CM | POA: Diagnosis not present

## 2020-12-02 DIAGNOSIS — E875 Hyperkalemia: Secondary | ICD-10-CM | POA: Diagnosis not present

## 2020-12-02 DIAGNOSIS — D509 Iron deficiency anemia, unspecified: Secondary | ICD-10-CM | POA: Diagnosis not present

## 2020-12-02 DIAGNOSIS — K256 Chronic or unspecified gastric ulcer with both hemorrhage and perforation: Secondary | ICD-10-CM | POA: Diagnosis not present

## 2020-12-02 DIAGNOSIS — D631 Anemia in chronic kidney disease: Secondary | ICD-10-CM | POA: Diagnosis not present

## 2020-12-02 DIAGNOSIS — N184 Chronic kidney disease, stage 4 (severe): Secondary | ICD-10-CM | POA: Diagnosis not present

## 2020-12-02 NOTE — Progress Notes (Signed)
Subjective:  Patient ID: [name], female    DOB: 1934-06-20  Age: 87 y.o. MRN: [id number]  Chief Complaint  Patient presents with  . Hospitalization Follow-up    HPI  Patient presents for follow up of UGI bleed, iron deficiency anemia, hyperkalemia, and congestive heart failure. Pt was admitted 2/15/to 11/26/2020. She presented with weakness, worsening edema, and dyspnea. Analiah was also noted to have acute on chronic renal failure with associated hyperkalemia. Aspirin and xarelto were held.  EGD showing nonbleeding ulcers.  She was scheduled for a colonoscopy at that time as well however she had poor prep.  Blood cleared from his stool. Hb was 4.5 on admission and was 7.7 on discharge after 3 U PRBCs.  Doppler: negative for dvt. Edema had improved by discharge.  Home health will resume weekly visits. Discharge instructions: STOP ASA, Restart Xarelto 2/21. Pt is feeling better. Swelling improved, but not resolved.  Current Outpatient Medications on File Prior to Visit  Medication Sig Dispense Refill  . Accu-Chek FastClix Lancets MISC TEST BLOOD SUGAR TWICE DAILY E11.69 204 each 3  . Alcohol Swabs (B-D SINGLE USE SWABS REGULAR) PADS USE TWICE DAILY  E11.69 200 each 3  . ALPRAZolam (XANAX) 0.5 MG tablet TAKE ONE TABLET BY MOUTH EVERY NIGHT AT BEDTIME FOR INSOMNIA 90 tablet 0  . amLODipine (NORVASC) 5 MG tablet TAKE 1 TABLET EVERY DAY 90 tablet 1  . Apple Cider Vinegar 188 MG CAPS Take 2 capsules by mouth daily.    . bumetanide (BUMEX) 1 MG tablet Take 1 tablet (1 mg total) by mouth 2 (two) times daily. 180 tablet 3  . calcium carbonate (OSCAL) 1500 (600 Ca) MG TABS tablet Take by mouth.    . digoxin (LANOXIN) 0.125 MG tablet TAKE 1/2 TABLET EVERY DAY (SUBSTITUTED FOR  DIGITEK) 45 tablet 1  . DROPLET PEN NEEDLES 31G X 5 MM MISC USE AS DIRECTED WITH LEVEMIR 300 each 3  . ezetimibe (ZETIA) 10 MG tablet TAKE 1 TABLET EVERY DAY 90 tablet 1  . fluticasone (FLONASE) 50  MCG/ACT nasal spray USE 2 SPRAYS IN EACH NOSTRIL EVERY DAY AS NEEDED 48 g 3  . glucose blood (ACCU-CHEK SMARTVIEW) test strip CHECK BLOOD SUGAR TWICE DAILY 200 strip 3  . LEVEMIR FLEXTOUCH 100 UNIT/ML FlexPen INJECT 42 UNITS SUBCUTANEOUSLY ONE TIME DAILY (Patient taking differently: 5 Units.) 45 mL 1  . levothyroxine (SYNTHROID) 50 MCG tablet TAKE 1 TABLET EVERY DAY 90 tablet 1  . losartan (COZAAR) 100 MG tablet TAKE 1 TABLET EVERY DAY 90 tablet 1  . magnesium oxide (MAG-OX) 400 MG tablet Take 400 mg by mouth 2 (two) times daily.    . Multiple Vitamin (MULTIVITAMIN WITH MINERALS) TABS tablet Take 1 tablet by mouth daily. Centrum Silver    . nystatin cream (MYCOSTATIN)     . olopatadine (PATANOL) 0.1 % ophthalmic solution Place 1 drop into both eyes daily.    Marland Kitchen omega-3 acid ethyl esters (LOVAZA) 1 g capsule TAKE 1 CAPSULE TWICE DAILY 180 capsule 1  . OVER THE COUNTER MEDICATION Take 2 tablets by mouth daily. Takes Beet Root tablets - 2 tablets daily     . oxyCODONE-acetaminophen (PERCOCET) 7.5-325 MG tablet Take 1 tablet by mouth every 6 (six) hours as needed (back pain.). 120 tablet 0  . polyethylene glycol (MIRALAX / GLYCOLAX) 17 g packet Take 17 g by mouth daily as needed.     . pravastatin (PRAVACHOL) 10 MG tablet TAKE 1 TABLET FOUR TIMES WEEKLY  48 tablet 1  . rOPINIRole (REQUIP) 1 MG tablet TAKE 1 TABLET AT BEDTIME 90 tablet 2  . spironolactone (ALDACTONE) 25 MG tablet TAKE 1/2 TABLET BY MOUTH EVERY DAY 45 tablet 1  . tiZANidine (ZANAFLEX) 4 MG tablet TAKE 1 TABLET AT BEDTIME 90 tablet 0  . XARELTO 15 MG TABS tablet TAKE 1 TABLET DAILY WITH SUPPER. 90 tablet 3   No current facility-administered medications on file prior to visit.   Past Medical History:  Diagnosis Date  . Amiodarone pulmonary toxicity   . Anemia    no GI bleeding; on iron  . Arthritis   . Atrial fibrillation ([place])   . Biventricular ICD (implantable cardiac defibrillator) in place    BiV ICD for nonischemic  cardiomyopathy; BiV responder  . Carotid artery occlusion   . CHF (congestive heart failure) ([place])   . Chronic a-fib ([place])    on xarelto; failed DCCV  . Chronic kidney disease, stage 4 (severe) ([place])   . Diabetes ([place])   . Fibromyalgia   . Full dentures   . GAD (generalized anxiety disorder)   . GERD (gastroesophageal reflux disease)   . H/O cardiovascular stress test 08/04/2010   normal imaging; EF 61%  . H/O echocardiogram 07/05/2010   EF 45-50%; aortic valve-mildly sclerotic; LA-mod dilaterd   . Hyperlipemia   . Hypertension   . Hypertensive heart disease with heart failure ([place])   . Hypothalamic disease ([place])    on thyroid supplement  . Hypothyroidism   . Hypothyroidism   . LBBB (left bundle branch block)   . Mixed hyperlipidemia   . Occlusion and stenosis of right carotid artery   . Other persistent atrial fibrillation ([place])   . Pneumonia   . Presence of automatic (implantable) cardiac defibrillator   . PVD (peripheral vascular disease) ([place])    L RA stent 1994  . RAS (renal artery stenosis) ([place])    a. renal dopp (12/18/08)- Right RA-<60%; L RA stent- open; nl size and shape in both kidneys;  b.  Rena Artery [name] (3/16):  SMA with > 70% stenosis, Bilateral prox RA with 1-59%  . Renal insufficiency, mild    05/01/12 Creat 1.47  . Restless leg syndrome   . Type 2 diabetes mellitus with diabetic neuropathy ([place])   . VT (ventricular tachycardia) ([place]) 9/11   polymorphic VT probably related to sotalol therapy  . Wears glasses    Past Surgical History:  Procedure Laterality Date  . ABDOMINAL HYSTERECTOMY    . APPENDECTOMY    . AV NODE ABLATION  06/08/2006   performed by Dr. Beckie Salts; due to Afib with RVR and tachy brady syndrome  . BIV ICD GENERATOR CHANGEOUT N/A 12/31/2017   Procedure: BIV ICD GENERATOR CHANGEOUT;  Surgeon: Evans Lance, MD;  Location: [facility];  Service: Cardiovascular;  Laterality: N/A;  . BIV ICD GENERTAOR CHANGE OUT  11/15/10    Medtronic Protecta [id number] serial #[id number]  . BiV ICD Placement  06/17/2007; 04/20/14   Medtronic Concerta [id number]; RA lead-Med 5076/53cm, [id number]; RV lead- SJM 7121/65cm, [id number]; LV lead- Med 4194/88cm, [id number]; gen change 04/2014 by Dr Lovena Le  . BIV PACEMAKER GENERATOR CHANGE OUT N/A 04/20/2014   Procedure: BIV PACEMAKER GENERATOR CHANGE OUT;  Surgeon: Evans Lance, MD;  Location: [facility] CATH [facility];  Service: Cardiovascular;  Laterality: N/A;  . BRAIN MENINGIOMA EXCISION  2011   L frontal meningi[facility]   . BREAST SURGERY     lumpectomy right  .  CARDIOVERSION  07/21/05   converted to sinus brady  . CAROTID ENDARTERECTOMY Right 09/24/2018   with bovine pericardial patch angioplasty  . CATARACT EXTRACTION W/ INTRAOCULAR LENS  IMPLANT, BILATERAL    . CHOLECYSTECTOMY     Dr. Sherald Hess in [place] and Dr. Lyda Jester follows; surgical stricture post procedure with stent placement  . COLONOSCOPY W/ BIOPSIES AND POLYPECTOMY    . DILATION AND CURETTAGE OF UTERUS    . ENDARTERECTOMY Right 09/24/2018   Procedure: ENDARTERECTOMY CAROTID RIGHT;  Surgeon: Angelia Mould, MD;  Location: [facility];  Service: Vascular;  Laterality: Right;  . PV Angio  1994   L Renal artery stent     Family History  Problem Relation Age of Onset  . Heart failure Mother   . Hypertension Mother   . Cancer Father        lung  . Dementia Sister   . Cancer Sister        pancreatic, renal and thyroid  . Heart attack Brother   . AAA (abdominal aortic aneurysm) Brother   . Stroke Maternal Grandmother   . Diabetes Other   . Stroke Son    Social History   Socioeconomic History  . Marital status: Married    Spouse name: Not on file  . Number of children: 6  . Years of education: Not on file  . Highest education level: Not on file  Occupational History  . Not on file  Tobacco Use  . Smoking status: Never Smoker  . Smokeless tobacco: Never Used  Vaping Use  . Vaping Use: Never used  Substance  and Sexual Activity  . Alcohol use: No    Alcohol/week: 0.0 standard drinks  . Drug use: No  . Sexual activity: Not Currently  Other Topics Concern  . Not on file  Social History Narrative   wears sunscreen, brushes and flosses daily, see's dentist bi-annually, has smoke/carbon monoxide detectors, wears a seatbelt and practices gun safety   Social Determinants of Health   Financial Resource Strain: Not on file  Food Insecurity: No Food Insecurity  . Worried About Charity fundraiser in the Last Year: Never true  . Ran Out of Food in the Last Year: Never true  Transportation Needs: No Transportation Needs  . Lack of Transportation (Medical): No  . Lack of Transportation (Non-Medical): No  Physical Activity: Not on file  Stress: Not on file  Social Connections: Not on file    Review of Systems  Constitutional: Positive for appetite change (Does not feel like eating.) and fatigue. Negative for chills and fever.  HENT: Negative for congestion, ear pain, rhinorrhea and sore throat.   Respiratory: Negative for cough and shortness of breath.   Cardiovascular: Negative for chest pain.  Gastrointestinal: Negative for abdominal pain, constipation, diarrhea, nausea and vomiting.  Genitourinary: Negative for dysuria and urgency.  Musculoskeletal: Negative for back pain and myalgias.  Neurological: Positive for weakness. Negative for dizziness, light-headedness and headaches.  Psychiatric/Behavioral: Negative for dysphoric mood. The patient is not nervous/anxious.      Objective:  BP (!) 142/72 (BP Location: Left Arm, Patient Position: Sitting)   Pulse 78   Temp (!) 97.5 F (36.4 C) (Temporal)   Ht 5\' 8"  (1.727 m)   SpO2 93%   BMI 29.46 kg/m   BP/Weight 12/09/2020 12/03/2020 1/32/4401  Systolic BP 027 253 664  Diastolic BP 62 72 68  Wt. (Lbs) 165 - -  BMI 25.09 29.46 -    Physical Exam  Vitals reviewed.  Constitutional:      Appearance: Normal appearance. She is normal weight.   Neck:     Vascular: No carotid bruit.  Cardiovascular:     Rate and Rhythm: Normal rate and regular rhythm.     Pulses: Normal pulses.     Heart sounds: Normal heart sounds.  Pulmonary:     Effort: Pulmonary effort is normal. No respiratory distress.     Breath sounds: Normal breath sounds.  Abdominal:     General: Abdomen is flat. Bowel sounds are normal.     Palpations: Abdomen is soft.     Tenderness: There is no abdominal tenderness.  Musculoskeletal:     Right lower leg: Edema present.     Left lower leg: Edema present.  Neurological:     Mental Status: She is alert and oriented to person, place, and time.  Psychiatric:        Mood and Affect: Mood normal.        Behavior: Behavior normal.    Diabetic Foot Exam - Simple   No data filed      Lab Results  Component Value Date   WBC CANCELED 12/03/2020   HGB 8.0 (A) 09/29/2020   HCT 25 (A) 09/29/2020   PLT 381 09/29/2020   GLUCOSE 110 (H) 12/03/2020   CHOL 99 (L) 08/26/2020   TRIG 78 08/26/2020   HDL 31 (L) 08/26/2020   LDLCALC 52 08/26/2020   ALT 14 12/03/2020   AST 19 12/03/2020   NA 144 12/03/2020   K 3.9 12/03/2020   CL 105 12/03/2020   CREATININE 1.41 (H) 12/03/2020   BUN 35 (H) 12/03/2020   CO2 24 12/03/2020   TSH 2.440 05/25/2020   INR 0.96 09/24/2018   HGBA1C 5.0 12/03/2020   MICROALBUR 10 12/25/2019      Assessment & Plan:   1. Type 2 diabetes mellitus with diabetic neuropathy, unspecified whether long term insulin use (HCC) Excellent control.  Stop insulin. Check sugars daily in am. - CBC with Differential/Platelet - Comprehensive metabolic panel - Hemoglobin A1c  2. Essential hypertension, benign The current medical regimen is fairly effective;  continue present plan and medications.  3. Chronic systolic congestive heart failure (HCC) Improved.  - Pro b natriuretic peptide (BNP)  4. Acute gastric ulcer with hemorrhage Held xarelto  5. Other iron deficiency anemia - check cbc.    6. Longstanding persistent atrial fibrillation (HCC) The current medical regimen is effective;  continue present plan and medications. Held xarelto after discharge.    Orders Placed This Encounter  Procedures  . CBC with Differential/Platelet  . Comprehensive metabolic panel  . Pro b natriuretic peptide (BNP)  . Hemoglobin A1c    Follow-up: No follow-ups on file.  An After Visit Summary was printed and given to the patient.  [name], MD [facility] [phone]

## 2020-12-03 ENCOUNTER — Ambulatory Visit (INDEPENDENT_AMBULATORY_CARE_PROVIDER_SITE_OTHER): Payer: Medicare HMO | Admitting: Family Medicine

## 2020-12-03 ENCOUNTER — Other Ambulatory Visit: Payer: Self-pay

## 2020-12-03 ENCOUNTER — Other Ambulatory Visit: Payer: Self-pay | Admitting: Family Medicine

## 2020-12-03 ENCOUNTER — Encounter: Payer: Self-pay | Admitting: Family Medicine

## 2020-12-03 VITALS — BP 142/72 | HR 78 | Temp 97.5°F | Ht 68.0 in

## 2020-12-03 DIAGNOSIS — D508 Other iron deficiency anemias: Secondary | ICD-10-CM | POA: Diagnosis not present

## 2020-12-03 DIAGNOSIS — I4811 Longstanding persistent atrial fibrillation: Secondary | ICD-10-CM

## 2020-12-03 DIAGNOSIS — I5022 Chronic systolic (congestive) heart failure: Secondary | ICD-10-CM

## 2020-12-03 DIAGNOSIS — I10 Essential (primary) hypertension: Secondary | ICD-10-CM | POA: Diagnosis not present

## 2020-12-03 DIAGNOSIS — K250 Acute gastric ulcer with hemorrhage: Secondary | ICD-10-CM

## 2020-12-03 DIAGNOSIS — E1140 Type 2 diabetes mellitus with diabetic neuropathy, unspecified: Secondary | ICD-10-CM | POA: Diagnosis not present

## 2020-12-03 LAB — HEMOGLOBIN A1C
Est. average glucose Bld gHb Est-mCnc: 97 mg/dL
Hgb A1c MFr Bld: 5 % (ref 4.8–5.6)

## 2020-12-04 ENCOUNTER — Other Ambulatory Visit: Payer: Self-pay | Admitting: Family Medicine

## 2020-12-06 ENCOUNTER — Telehealth: Payer: Self-pay

## 2020-12-06 NOTE — Progress Notes (Signed)
Chronic Care Management Pharmacy Assistant   Name: [name]  MRN: [id number] DOB: 01-Apr-1934  Reason for Encounter: Adherence     PCP : [name], MD  Allergies:   Allergies  Allergen Reactions  . Gabapentin Swelling  . Pregabalin Swelling       . Morphine Nausea Only  . Penicillins Rash and Other (See Comments)    Has patient had a PCN reaction causing immediate rash, facial/tongue/throat swelling, SOB or lightheadedness with hypotension: No Has patient had a PCN reaction causing severe rash involving mucus membranes or skin necrosis: No Has patient had a PCN reaction that required hospitalization: No - MD office Has patient had a PCN reaction occurring within the last 10 years: No If all of the above answers are "NO", then may proceed with Cephalosporin use.   . Tape Rash  . Ace Inhibitors Other (See Comments)    Angioedema (ALLERGY/intolerance)  . Carvedilol Other (See Comments)    Myalgias (intolerance)  . Insulin Glargine Itching  . Metoprolol Other (See Comments)    Angioedema (ALLERGY/intolerance)  . Pantoprazole Nausea Only  . Procaine Other (See Comments)    unknown  . Statins Other (See Comments)    Myalgias (intolerance)  . Lisinopril Rash         Medications: Outpatient Encounter Medications as of 12/06/2020  Medication Sig  . Accu-Chek FastClix Lancets MISC TEST BLOOD SUGAR TWICE DAILY E11.69  . Alcohol Swabs (B-D SINGLE USE SWABS REGULAR) PADS USE TWICE DAILY  E11.69  . ALPRAZolam (XANAX) 0.5 MG tablet TAKE ONE TABLET BY MOUTH EVERY NIGHT AT BEDTIME FOR INSOMNIA  . amLODipine (NORVASC) 5 MG tablet TAKE 1 TABLET EVERY DAY  . Apple Cider Vinegar 188 MG CAPS Take 2 capsules by mouth daily.  . bumetanide (BUMEX) 1 MG tablet Take 1 tablet (1 mg total) by mouth 2 (two) times daily.  . calcium carbonate (OSCAL) 1500 (600 Ca) MG TABS tablet Take by mouth.  . digoxin (LANOXIN) 0.125 MG tablet TAKE 1/2 TABLET EVERY DAY (SUBSTITUTED  FOR  DIGITEK)  . DROPLET PEN NEEDLES 31G X 5 MM MISC USE AS DIRECTED WITH LEVEMIR  . DULoxetine (CYMBALTA) 20 MG capsule Take 1 capsule (20 mg total) by mouth at bedtime.  Marland Kitchen ezetimibe (ZETIA) 10 MG tablet TAKE 1 TABLET EVERY DAY  . fluticasone (FLONASE) 50 MCG/ACT nasal spray USE 2 SPRAYS IN EACH NOSTRIL EVERY DAY AS NEEDED  . glucose blood (ACCU-CHEK SMARTVIEW) test strip CHECK BLOOD SUGAR TWICE DAILY  . LEVEMIR FLEXTOUCH 100 UNIT/ML FlexPen INJECT 42 UNITS SUBCUTANEOUSLY ONE TIME DAILY (Patient taking differently: 5 Units.)  . levothyroxine (SYNTHROID) 50 MCG tablet TAKE 1 TABLET EVERY DAY  . losartan (COZAAR) 100 MG tablet TAKE 1 TABLET EVERY DAY  . magnesium oxide (MAG-OX) 400 MG tablet Take 400 mg by mouth 2 (two) times daily.  . Multiple Vitamin (MULTIVITAMIN WITH MINERALS) TABS tablet Take 1 tablet by mouth daily. Centrum Silver  . nystatin cream (MYCOSTATIN)   . olopatadine (PATANOL) 0.1 % ophthalmic solution Place 1 drop into both eyes daily.  Marland Kitchen omega-3 acid ethyl esters (LOVAZA) 1 g capsule TAKE 1 CAPSULE TWICE DAILY  . OVER THE COUNTER MEDICATION Take 2 tablets by mouth daily. Takes Beet Root tablets - 2 tablets daily   . oxyCODONE-acetaminophen (PERCOCET) 7.5-325 MG tablet Take 1 tablet by mouth every 6 (six) hours as needed (back pain.).  Marland Kitchen polyethylene glycol (MIRALAX / GLYCOLAX) 17 g packet Take 17 g by mouth  daily as needed.   . pravastatin (PRAVACHOL) 10 MG tablet TAKE 1 TABLET FOUR TIMES WEEKLY  . rOPINIRole (REQUIP) 1 MG tablet TAKE 1 TABLET AT BEDTIME  . spironolactone (ALDACTONE) 25 MG tablet TAKE 1/2 TABLET BY MOUTH EVERY DAY  . tiZANidine (ZANAFLEX) 4 MG tablet TAKE 1 TABLET AT BEDTIME  . XARELTO 15 MG TABS tablet TAKE 1 TABLET DAILY WITH SUPPER.   No facility-administered encounter medications on file as of 12/06/2020.    Current Diagnosis: Patient Active Problem List   Diagnosis Date Noted  . Chronic kidney disease, stage 4 (severe) ([place])   . GAD (generalized  anxiety disorder)   . Acute gastritis with hemorrhage 09/06/2020  . Iron deficiency anemia 09/06/2020  . Amiodarone pulmonary toxicity   . Anemia   . Arthritis   . Carotid artery occlusion   . CHF (congestive heart failure) ([place])   . Chronic a-fib ([place])   . Chronic kidney disease, stage IV (severe) ([place])   . Diabetes ([place])   . Fibromyalgia   . Full dentures   . GERD (gastroesophageal reflux disease)   . Hyperlipemia   . Hypertension   . Hypertensive heart disease with heart failure ([place])   . Hypothalamic disease ([place])   . Hypothyroidism   . LBBB (left bundle branch block)   . Occlusion and stenosis of right carotid artery   . Other persistent atrial fibrillation ([place])   . Pneumonia   . Presence of automatic (implantable) cardiac defibrillator   . PVD (peripheral vascular disease) ([place])   . RAS (renal artery stenosis) ([place])   . Renal insufficiency, mild   . Restless leg syndrome   . Type 2 diabetes mellitus with diabetic neuropathy ([place])   . Wears glasses   . Mixed hyperlipidemia 12/25/2019  . Dyslipidemia associated with type 2 diabetes mellitus ([place]) 12/25/2019  . Secondary hypothyroidism 12/25/2019  . Diabetic polyneuropathy associated with diabetes mellitus due to underlying condition ([place]) 12/01/2019  . Primary osteoarthritis of left knee 12/01/2019  . Carotid artery stenosis 09/24/2018  . NSVT (nonsustained ventricular tachycardia) ([place]) 09/18/2018  . Hallux limitus of right foot 06/27/2016  . ATRIAL FIBRILLATION 12/26/2010  . VENTRICULAR FIBRILLATION 12/26/2010  . Chronic systolic heart failure ([place]) 12/26/2010  . Automatic implantable cardioverter-defibrillator in situ 12/26/2010  . Cardiomyopathy ([place]) 12/23/2010  . ATRIOVENTRICULAR BLOCK, 3RD DEGREE 12/23/2010  . AV BLOCK 12/23/2010  . ICD (implantable cardioverter-defibrillator), biventricular, in situ 12/23/2010  . Complete atrioventricular block ([place]) 12/23/2010  . H/O cardiovascular stress test 08/04/2010   . H/O echocardiogram 07/05/2010  . VT (ventricular tachycardia) ([place]) 06/2010   Chart review noted the patient saw Dr. Tobie Poet on 12/03/20, the visit noted the following: She was admitted to the hospital for weakness and worsening edema, During her admission her Aspirin was discontinued and her Xarelto was held. She is scheduled to restart it 11/29/20.  The patient has an appointment on 12/16/20 with Donette Larry, CPP  Adherence Gap not available  Follow-Up:  Pharmacist Review  Donette Larry, [place], [place] Pharmacist Assistant [phone]

## 2020-12-06 NOTE — Progress Notes (Signed)
Entered in Error

## 2020-12-07 DIAGNOSIS — K256 Chronic or unspecified gastric ulcer with both hemorrhage and perforation: Secondary | ICD-10-CM | POA: Diagnosis not present

## 2020-12-07 DIAGNOSIS — E875 Hyperkalemia: Secondary | ICD-10-CM | POA: Diagnosis not present

## 2020-12-07 DIAGNOSIS — D509 Iron deficiency anemia, unspecified: Secondary | ICD-10-CM | POA: Diagnosis not present

## 2020-12-07 DIAGNOSIS — D631 Anemia in chronic kidney disease: Secondary | ICD-10-CM | POA: Diagnosis not present

## 2020-12-07 DIAGNOSIS — E1122 Type 2 diabetes mellitus with diabetic chronic kidney disease: Secondary | ICD-10-CM | POA: Diagnosis not present

## 2020-12-07 DIAGNOSIS — I130 Hypertensive heart and chronic kidney disease with heart failure and stage 1 through stage 4 chronic kidney disease, or unspecified chronic kidney disease: Secondary | ICD-10-CM | POA: Diagnosis not present

## 2020-12-07 DIAGNOSIS — I5023 Acute on chronic systolic (congestive) heart failure: Secondary | ICD-10-CM | POA: Diagnosis not present

## 2020-12-07 DIAGNOSIS — K922 Gastrointestinal hemorrhage, unspecified: Secondary | ICD-10-CM | POA: Diagnosis not present

## 2020-12-07 DIAGNOSIS — N184 Chronic kidney disease, stage 4 (severe): Secondary | ICD-10-CM | POA: Diagnosis not present

## 2020-12-08 ENCOUNTER — Other Ambulatory Visit (INDEPENDENT_AMBULATORY_CARE_PROVIDER_SITE_OTHER): Payer: Medicare HMO | Admitting: Family Medicine

## 2020-12-08 ENCOUNTER — Encounter: Payer: Self-pay | Admitting: Cardiology

## 2020-12-08 DIAGNOSIS — E1122 Type 2 diabetes mellitus with diabetic chronic kidney disease: Secondary | ICD-10-CM | POA: Diagnosis not present

## 2020-12-08 DIAGNOSIS — E875 Hyperkalemia: Secondary | ICD-10-CM | POA: Diagnosis not present

## 2020-12-08 DIAGNOSIS — K922 Gastrointestinal hemorrhage, unspecified: Secondary | ICD-10-CM | POA: Diagnosis not present

## 2020-12-08 DIAGNOSIS — N184 Chronic kidney disease, stage 4 (severe): Secondary | ICD-10-CM | POA: Diagnosis not present

## 2020-12-08 DIAGNOSIS — D509 Iron deficiency anemia, unspecified: Secondary | ICD-10-CM | POA: Diagnosis not present

## 2020-12-08 DIAGNOSIS — I130 Hypertensive heart and chronic kidney disease with heart failure and stage 1 through stage 4 chronic kidney disease, or unspecified chronic kidney disease: Secondary | ICD-10-CM | POA: Diagnosis not present

## 2020-12-08 DIAGNOSIS — I5023 Acute on chronic systolic (congestive) heart failure: Secondary | ICD-10-CM | POA: Diagnosis not present

## 2020-12-08 DIAGNOSIS — K256 Chronic or unspecified gastric ulcer with both hemorrhage and perforation: Secondary | ICD-10-CM | POA: Diagnosis not present

## 2020-12-08 DIAGNOSIS — D631 Anemia in chronic kidney disease: Secondary | ICD-10-CM | POA: Diagnosis not present

## 2020-12-08 LAB — COMPREHENSIVE METABOLIC PANEL
ALT: 14 IU/L (ref 0–32)
AST: 19 IU/L (ref 0–40)
Albumin/Globulin Ratio: 1.8 (ref 1.2–2.2)
Albumin: 3.6 g/dL (ref 3.6–4.6)
Alkaline Phosphatase: 87 IU/L (ref 44–121)
BUN/Creatinine Ratio: 25 (ref 12–28)
BUN: 35 mg/dL — ABNORMAL HIGH (ref 8–27)
Bilirubin Total: 0.6 mg/dL (ref 0.0–1.2)
CO2: 24 mmol/L (ref 20–29)
Calcium: 9.6 mg/dL (ref 8.7–10.3)
Chloride: 105 mmol/L (ref 96–106)
Creatinine, Ser: 1.41 mg/dL — ABNORMAL HIGH (ref 0.57–1.00)
GFR calc Af Amer: 39 mL/min/{1.73_m2} — ABNORMAL LOW (ref >59)
GFR calc non Af Amer: 34 mL/min/{1.73_m2} — ABNORMAL LOW (ref >59)
Globulin, Total: 2 g/dL (ref 1.5–4.5)
Glucose: 110 mg/dL — ABNORMAL HIGH (ref 65–99)
Potassium: 3.9 mmol/L (ref 3.5–5.2)
Sodium: 144 mmol/L (ref 134–144)
Total Protein: 5.6 g/dL — ABNORMAL LOW (ref 6.0–8.5)

## 2020-12-08 LAB — CBC WITH DIFFERENTIAL/PLATELET

## 2020-12-08 LAB — PRO B NATRIURETIC PEPTIDE: NT-Pro BNP: 9526 pg/mL — ABNORMAL HIGH (ref 0–738)

## 2020-12-08 NOTE — Telephone Encounter (Signed)
error 

## 2020-12-08 NOTE — Progress Notes (Signed)
Patient is having increasing sob  Increase bumex. Instructed to take an extra now and increase to two pills twice a day. (So I want her to take 2 tonight.) Come in the morning at 10:45 am.  I would like pt to go to outpatient at hospital in am and get cbc, cmp, probnp, and cxr stat before her appointment.

## 2020-12-08 NOTE — Progress Notes (Signed)
Patient is having increasing sob  Increase bumex. Instructed to take an extra now and increase to two pills twice a day. (So I want her to take 2 tonight.) Come in the morning at 10:45 am.  I would like pt to go to outpatient at hospital in am and get cbc, cmp, probnp, and cxr stat before her appointment.     Patient was informed and understood verbally and has been scheduled for appointment as well as understanding verbally that she will have to have labs and chest xray done prior to coming in for the appointment. [place]

## 2020-12-09 ENCOUNTER — Ambulatory Visit (INDEPENDENT_AMBULATORY_CARE_PROVIDER_SITE_OTHER): Payer: Medicare HMO | Admitting: Family Medicine

## 2020-12-09 ENCOUNTER — Encounter: Payer: Self-pay | Admitting: Family Medicine

## 2020-12-09 ENCOUNTER — Other Ambulatory Visit: Payer: Self-pay

## 2020-12-09 VITALS — BP 122/62 | HR 75 | Temp 97.0°F | Ht 68.0 in | Wt 165.0 lb

## 2020-12-09 DIAGNOSIS — R5383 Other fatigue: Secondary | ICD-10-CM

## 2020-12-09 DIAGNOSIS — I5023 Acute on chronic systolic (congestive) heart failure: Secondary | ICD-10-CM

## 2020-12-09 DIAGNOSIS — I4811 Longstanding persistent atrial fibrillation: Secondary | ICD-10-CM

## 2020-12-09 DIAGNOSIS — R0602 Shortness of breath: Secondary | ICD-10-CM | POA: Diagnosis not present

## 2020-12-09 DIAGNOSIS — R531 Weakness: Secondary | ICD-10-CM | POA: Diagnosis not present

## 2020-12-09 DIAGNOSIS — I517 Cardiomegaly: Secondary | ICD-10-CM | POA: Diagnosis not present

## 2020-12-09 DIAGNOSIS — D500 Iron deficiency anemia secondary to blood loss (chronic): Secondary | ICD-10-CM | POA: Diagnosis not present

## 2020-12-09 DIAGNOSIS — J90 Pleural effusion, not elsewhere classified: Secondary | ICD-10-CM | POA: Diagnosis not present

## 2020-12-09 NOTE — Progress Notes (Signed)
Subjective:  Patient ID: [name], female    DOB: 04/21/1934  Age: 87 y.o. MRN: [id number]  Chief Complaint  Patient presents with  . Shortness of Breath    HPI  Patient presents for follow up of CHF and anemia. Patient went to [facility] this morning and had a cbc, cmp, probnp, and cxr. Pro BNP was elevated at 9,526 three days ago more so than when she was in the hospital. It improved today dropping into the 7000s. She had increased dyspnea and increased swelling of BL legs yesterday. I increased bumex 1 mg 2 twice a day. Her breathing has improved. Denies chest pain. No orthopnea. She is feeling very fatigued and weak still. Her hemoglobin was 9 today. CXR showed small pleural effusions. No pneumonia.   Current Outpatient Medications on File Prior to Visit  Medication Sig Dispense Refill  . Accu-Chek FastClix Lancets MISC TEST BLOOD SUGAR TWICE DAILY E11.69 204 each 3  . Alcohol Swabs (B-D SINGLE USE SWABS REGULAR) PADS USE TWICE DAILY  E11.69 200 each 3  . ALPRAZolam (XANAX) 0.5 MG tablet TAKE ONE TABLET BY MOUTH EVERY NIGHT AT BEDTIME FOR INSOMNIA 90 tablet 0  . amLODipine (NORVASC) 5 MG tablet TAKE 1 TABLET EVERY DAY 90 tablet 1  . Apple Cider Vinegar 188 MG CAPS Take 2 capsules by mouth daily.    . bumetanide (BUMEX) 1 MG tablet Take 1 tablet (1 mg total) by mouth 2 (two) times daily. 180 tablet 3  . calcium carbonate (OSCAL) 1500 (600 Ca) MG TABS tablet Take by mouth.    . digoxin (LANOXIN) 0.125 MG tablet TAKE 1/2 TABLET EVERY DAY (SUBSTITUTED FOR  DIGITEK) 45 tablet 1  . DROPLET PEN NEEDLES 31G X 5 MM MISC USE AS DIRECTED WITH LEVEMIR 300 each 3  . DULoxetine (CYMBALTA) 20 MG capsule TAKE 1 CAPSULE BY MOUTH AT BEDTIME. 30 capsule 0  . ezetimibe (ZETIA) 10 MG tablet TAKE 1 TABLET EVERY DAY 90 tablet 1  . fluticasone (FLONASE) 50 MCG/ACT nasal spray USE 2 SPRAYS IN EACH NOSTRIL EVERY DAY AS NEEDED 48 g 3  . glucose blood (ACCU-CHEK SMARTVIEW) test strip  CHECK BLOOD SUGAR TWICE DAILY 200 strip 3  . LEVEMIR FLEXTOUCH 100 UNIT/ML FlexPen INJECT 42 UNITS SUBCUTANEOUSLY ONE TIME DAILY (Patient taking differently: 5 Units.) 45 mL 1  . levothyroxine (SYNTHROID) 50 MCG tablet TAKE 1 TABLET EVERY DAY 90 tablet 1  . losartan (COZAAR) 100 MG tablet TAKE 1 TABLET EVERY DAY 90 tablet 1  . magnesium oxide (MAG-OX) 400 MG tablet Take 400 mg by mouth 2 (two) times daily.    . Multiple Vitamin (MULTIVITAMIN WITH MINERALS) TABS tablet Take 1 tablet by mouth daily. Centrum Silver    . nystatin cream (MYCOSTATIN)     . olopatadine (PATANOL) 0.1 % ophthalmic solution Place 1 drop into both eyes daily.    Marland Kitchen omega-3 acid ethyl esters (LOVAZA) 1 g capsule TAKE 1 CAPSULE TWICE DAILY 180 capsule 1  . OVER THE COUNTER MEDICATION Take 2 tablets by mouth daily. Takes Beet Root tablets - 2 tablets daily     . oxyCODONE-acetaminophen (PERCOCET) 7.5-325 MG tablet Take 1 tablet by mouth every 6 (six) hours as needed (back pain.). 120 tablet 0  . polyethylene glycol (MIRALAX / GLYCOLAX) 17 g packet Take 17 g by mouth daily as needed.     . pravastatin (PRAVACHOL) 10 MG tablet TAKE 1 TABLET FOUR TIMES WEEKLY 48 tablet 1  . rOPINIRole (  REQUIP) 1 MG tablet TAKE 1 TABLET AT BEDTIME 90 tablet 2  . spironolactone (ALDACTONE) 25 MG tablet TAKE 1/2 TABLET BY MOUTH EVERY DAY 45 tablet 1  . tiZANidine (ZANAFLEX) 4 MG tablet TAKE 1 TABLET AT BEDTIME 90 tablet 0  . XARELTO 15 MG TABS tablet TAKE 1 TABLET DAILY WITH SUPPER. 90 tablet 3   No current facility-administered medications on file prior to visit.   Past Medical History:  Diagnosis Date  . Amiodarone pulmonary toxicity   . Anemia    no GI bleeding; on iron  . Arthritis   . Atrial fibrillation ([place])   . Biventricular ICD (implantable cardiac defibrillator) in place    BiV ICD for nonischemic cardiomyopathy; BiV responder  . Carotid artery occlusion   . CHF (congestive heart failure) ([place])   . Chronic a-fib ([place])    on  xarelto; failed DCCV  . Chronic kidney disease, stage 4 (severe) ([place])   . Diabetes ([place])   . Fibromyalgia   . Full dentures   . GAD (generalized anxiety disorder)   . GERD (gastroesophageal reflux disease)   . H/O cardiovascular stress test 08/04/2010   normal imaging; EF 61%  . H/O echocardiogram 07/05/2010   EF 45-50%; aortic valve-mildly sclerotic; LA-mod dilaterd   . Hyperlipemia   . Hypertension   . Hypertensive heart disease with heart failure ([place])   . Hypothalamic disease ([place])    on thyroid supplement  . Hypothyroidism   . Hypothyroidism   . LBBB (left bundle branch block)   . Mixed hyperlipidemia   . Occlusion and stenosis of right carotid artery   . Other persistent atrial fibrillation ([place])   . Pneumonia   . Presence of automatic (implantable) cardiac defibrillator   . PVD (peripheral vascular disease) ([place])    L RA stent 1994  . RAS (renal artery stenosis) ([place])    a. renal dopp (12/18/08)- Right RA-<60%; L RA stent- open; nl size and shape in both kidneys;  b.  Rena Artery [name] (3/16):  SMA with > 70% stenosis, Bilateral prox RA with 1-59%  . Renal insufficiency, mild    05/01/12 Creat 1.47  . Restless leg syndrome   . Type 2 diabetes mellitus with diabetic neuropathy ([place])   . VT (ventricular tachycardia) ([place]) 9/11   polymorphic VT probably related to sotalol therapy  . Wears glasses    Past Surgical History:  Procedure Laterality Date  . ABDOMINAL HYSTERECTOMY    . APPENDECTOMY    . AV NODE ABLATION  06/08/2006   performed by Dr. Beckie Salts; due to Afib with RVR and tachy brady syndrome  . BIV ICD GENERATOR CHANGEOUT N/A 12/31/2017   Procedure: BIV ICD GENERATOR CHANGEOUT;  Surgeon: Evans Lance, MD;  Location: [place] [facility];  Service: Cardiovascular;  Laterality: N/A;  . BIV ICD GENERTAOR CHANGE OUT  11/15/10   Medtronic Protecta [id number] serial #[id number]  . BiV ICD Placement  06/17/2007; 04/20/14   Medtronic Concerta [id number]; RA lead-Med  5076/53cm, [id number]; RV lead- SJM 7121/65cm, [id number]; LV lead- Med 4194/88cm, [id number]; gen change 04/2014 by Dr Lovena Le  . BIV PACEMAKER GENERATOR CHANGE OUT N/A 04/20/2014   Procedure: BIV PACEMAKER GENERATOR CHANGE OUT;  Surgeon: Evans Lance, MD;  Location: [facility] C[facility] LAB;  Service: Cardiovascular;  Laterality: N/A;  . BRAIN MENINGIOMA EXCISION  2011   L frontal [facility]ioma at [facility]   . BREAST SURGERY     lumpectomy right  . CARDIOVERSION  07/21/05   converted  to sinus brady  . CAROTID ENDARTERECTOMY Right 09/24/2018   with bovine pericardial patch angioplasty  . CATARACT EXTRACTION W/ INTRAOCULAR LENS  IMPLANT, BILATERAL    . CHOLECYSTECTOMY     Dr. Sherald Hess in [place] and Dr. Lyda Jester follows; surgical stricture post procedure with stent placement  . COLONOSCOPY W/ BIOPSIES AND POLYPECTOMY    . DILATION AND CURETTAGE OF UTERUS    . ENDARTERECTOMY Right 09/24/2018   Procedure: ENDARTERECTOMY CAROTID RIGHT;  Surgeon: Angelia Mould, MD;  Location: [facility];  Service: Vascular;  Laterality: Right;  . PV Angio  1994   L Renal artery stent     Family History  Problem Relation Age of Onset  . Heart failure Mother   . Hypertension Mother   . Cancer Father        lung  . Dementia Sister   . Cancer Sister        pancreatic, renal and thyroid  . Heart attack Brother   . AAA (abdominal aortic aneurysm) Brother   . Stroke Maternal Grandmother   . Diabetes Other   . Stroke Son    Social History   Socioeconomic History  . Marital status: Married    Spouse name: Not on file  . Number of children: 6  . Years of education: Not on file  . Highest education level: Not on file  Occupational History  . Not on file  Tobacco Use  . Smoking status: Never Smoker  . Smokeless tobacco: Never Used  Vaping Use  . Vaping Use: Never used  Substance and Sexual Activity  . Alcohol use: No    Alcohol/week: 0.0 standard drinks  . Drug use: No  . Sexual activity: Not Currently   Other Topics Concern  . Not on file  Social History Narrative   wears sunscreen, brushes and flosses daily, see's dentist bi-annually, has smoke/carbon monoxide detectors, wears a seatbelt and practices gun safety   Social Determinants of Health   Financial Resource Strain: Not on file  Food Insecurity: No Food Insecurity  . Worried About Charity fundraiser in the Last Year: Never true  . Ran Out of Food in the Last Year: Never true  Transportation Needs: No Transportation Needs  . Lack of Transportation (Medical): No  . Lack of Transportation (Non-Medical): No  Physical Activity: Not on file  Stress: Not on file  Social Connections: Not on file    Review of Systems  Constitutional: Negative for chills, fatigue and fever.  HENT: Negative for congestion, ear pain, postnasal drip, rhinorrhea, sinus pressure, sinus pain and sore throat.   Respiratory: Positive for shortness of breath. Negative for cough.   Cardiovascular: Negative for chest pain.  Neurological: Positive for weakness. Negative for dizziness and headaches.     Objective:  BP 122/62   Pulse 75   Temp (!) 97 F (36.1 C)   Ht 5\' 8"  (1.727 m)   Wt 165 lb (74.8 kg)   SpO2 95%   BMI 25.09 kg/m   BP/Weight 12/09/2020 12/03/2020 3/71/0626  Systolic BP 948 546 270  Diastolic BP 62 72 68  Wt. (Lbs) 165 - -  BMI 25.09 29.46 -    Physical Exam Vitals reviewed.  Constitutional:      Appearance: She is well-developed. She is obese.     Comments: Patient is in wheelchair.   Cardiovascular:     Rate and Rhythm: Normal rate. Rhythm irregular.  Pulmonary:     Effort: Pulmonary effort is normal.  Breath sounds: Normal breath sounds.  Musculoskeletal:     Right lower leg: Edema (1 +) present.     Left lower leg: Edema (1 +) present.  Neurological:     Mental Status: She is alert.      Lab Results  Component Value Date   WBC CANCELED 12/03/2020   HGB 8.0 (A) 09/29/2020   HCT 25 (A) 09/29/2020   PLT 381  09/29/2020   GLUCOSE 110 (H) 12/03/2020   CHOL 99 (L) 08/26/2020   TRIG 78 08/26/2020   HDL 31 (L) 08/26/2020   LDLCALC 52 08/26/2020   ALT 14 12/03/2020   AST 19 12/03/2020   NA 144 12/03/2020   K 3.9 12/03/2020   CL 105 12/03/2020   CREATININE 1.41 (H) 12/03/2020   BUN 35 (H) 12/03/2020   CO2 24 12/03/2020   TSH 2.440 05/25/2020   INR 0.96 09/24/2018   HGBA1C 5.0 12/03/2020   MICROALBUR 10 12/25/2019      Assessment & Plan:   1. Acute on chronic systolic heart failure (HCC) Continue on bumex 1 mg 2 twice daily.  Patient to follow up with Dr. Harriet Masson next week.   2. Iron deficiency anemia due to chronic blood loss Improved. Hb 9.   3. Longstanding persistent atrial fibrillation (HCC) Continue current medicines.  Continue to hold blood thinner.  4. Other fatigue I hope this will improve as her hemoglobin improves and her CHF improves.  If unable to see cardiology next week, will have patient follow up here.   5. Weakness  See above.   Follow-up: Return in about 5 days (around 12/14/2020) for for knee injection, recheck labs, and assess CHF. Marland Kitchen  An After Visit Summary was printed and given to the patient.  [name], MD [facility] [phone]

## 2020-12-10 ENCOUNTER — Telehealth: Payer: Self-pay

## 2020-12-10 DIAGNOSIS — M25562 Pain in left knee: Secondary | ICD-10-CM | POA: Diagnosis not present

## 2020-12-10 DIAGNOSIS — R531 Weakness: Secondary | ICD-10-CM | POA: Diagnosis not present

## 2020-12-10 DIAGNOSIS — I119 Hypertensive heart disease without heart failure: Secondary | ICD-10-CM | POA: Diagnosis not present

## 2020-12-10 DIAGNOSIS — I509 Heart failure, unspecified: Secondary | ICD-10-CM | POA: Diagnosis not present

## 2020-12-10 DIAGNOSIS — I361 Nonrheumatic tricuspid (valve) insufficiency: Secondary | ICD-10-CM | POA: Diagnosis not present

## 2020-12-10 DIAGNOSIS — R0602 Shortness of breath: Secondary | ICD-10-CM | POA: Diagnosis not present

## 2020-12-10 DIAGNOSIS — Z743 Need for continuous supervision: Secondary | ICD-10-CM | POA: Diagnosis not present

## 2020-12-10 DIAGNOSIS — Z86711 Personal history of pulmonary embolism: Secondary | ICD-10-CM | POA: Diagnosis not present

## 2020-12-10 DIAGNOSIS — R279 Unspecified lack of coordination: Secondary | ICD-10-CM | POA: Diagnosis not present

## 2020-12-10 DIAGNOSIS — D649 Anemia, unspecified: Secondary | ICD-10-CM | POA: Diagnosis not present

## 2020-12-10 DIAGNOSIS — K921 Melena: Secondary | ICD-10-CM | POA: Diagnosis not present

## 2020-12-10 DIAGNOSIS — J811 Chronic pulmonary edema: Secondary | ICD-10-CM | POA: Diagnosis not present

## 2020-12-10 DIAGNOSIS — J189 Pneumonia, unspecified organism: Secondary | ICD-10-CM | POA: Diagnosis not present

## 2020-12-10 DIAGNOSIS — I472 Ventricular tachycardia: Secondary | ICD-10-CM | POA: Diagnosis not present

## 2020-12-10 DIAGNOSIS — I5023 Acute on chronic systolic (congestive) heart failure: Secondary | ICD-10-CM | POA: Diagnosis not present

## 2020-12-10 DIAGNOSIS — F418 Other specified anxiety disorders: Secondary | ICD-10-CM | POA: Diagnosis not present

## 2020-12-10 DIAGNOSIS — N184 Chronic kidney disease, stage 4 (severe): Secondary | ICD-10-CM | POA: Diagnosis not present

## 2020-12-10 DIAGNOSIS — J9601 Acute respiratory failure with hypoxia: Secondary | ICD-10-CM | POA: Diagnosis not present

## 2020-12-10 DIAGNOSIS — J90 Pleural effusion, not elsewhere classified: Secondary | ICD-10-CM | POA: Diagnosis not present

## 2020-12-10 DIAGNOSIS — I340 Nonrheumatic mitral (valve) insufficiency: Secondary | ICD-10-CM | POA: Diagnosis not present

## 2020-12-10 DIAGNOSIS — I517 Cardiomegaly: Secondary | ICD-10-CM | POA: Diagnosis not present

## 2020-12-10 DIAGNOSIS — R0902 Hypoxemia: Secondary | ICD-10-CM | POA: Diagnosis not present

## 2020-12-10 DIAGNOSIS — I130 Hypertensive heart and chronic kidney disease with heart failure and stage 1 through stage 4 chronic kidney disease, or unspecified chronic kidney disease: Secondary | ICD-10-CM | POA: Diagnosis not present

## 2020-12-10 DIAGNOSIS — J9811 Atelectasis: Secondary | ICD-10-CM | POA: Diagnosis not present

## 2020-12-10 NOTE — Telephone Encounter (Signed)
Received VM from [place], [place], stating pt's son stated pt had a rough night. Oxygen was low ranging 79-81. States nurse did teach son on how to use "oxygen boost" but pt does not have home oxygen.   CMA called emergency contact for pt, [facility]. Daughter states pt's oxygen was low. When granddaughter was at pt's house her oxygen was 43 but did come up to 4.   CMA then called house phone multiple times trying to reach granddaughter. Pt answered stating no one was there with her. Everyone had left. Pt took oxygen saturation, at time of phone call it was 94%. Pt states when oxygen was low was when she was laying down and first got up to use the bathroom. Pt made aware that she needed to go to hospital. Speaking with Dr. Tobie Poet, dr advised she go to hospital immediately as CHF could be worsening. Pt agreed, accepted for CMA to call EMS. Pt requested CMA hold off on calling EMS until someone was called and in home to take care of pt's husband. Pt wanted daughter called. Daughter called and made aware. Daughter called son, son went to house. EMS called at 1040. Daughter aware EMS called.   Royce Macadamia, [place] 12/10/20 11:17 AM

## 2020-12-12 DIAGNOSIS — M25562 Pain in left knee: Secondary | ICD-10-CM | POA: Diagnosis not present

## 2020-12-14 ENCOUNTER — Ambulatory Visit (INDEPENDENT_AMBULATORY_CARE_PROVIDER_SITE_OTHER): Payer: Medicare HMO | Admitting: Family Medicine

## 2020-12-14 DIAGNOSIS — M1712 Unilateral primary osteoarthritis, left knee: Secondary | ICD-10-CM

## 2020-12-14 DIAGNOSIS — R5383 Other fatigue: Secondary | ICD-10-CM

## 2020-12-14 DIAGNOSIS — I5023 Acute on chronic systolic (congestive) heart failure: Secondary | ICD-10-CM

## 2020-12-14 NOTE — Progress Notes (Signed)
Cancelled.  

## 2020-12-16 ENCOUNTER — Telehealth: Payer: Medicare HMO

## 2020-12-16 DIAGNOSIS — I361 Nonrheumatic tricuspid (valve) insufficiency: Secondary | ICD-10-CM

## 2020-12-16 DIAGNOSIS — I340 Nonrheumatic mitral (valve) insufficiency: Secondary | ICD-10-CM

## 2020-12-21 DIAGNOSIS — Z743 Need for continuous supervision: Secondary | ICD-10-CM | POA: Diagnosis not present

## 2020-12-21 DIAGNOSIS — N184 Chronic kidney disease, stage 4 (severe): Secondary | ICD-10-CM | POA: Diagnosis not present

## 2020-12-21 DIAGNOSIS — R279 Unspecified lack of coordination: Secondary | ICD-10-CM | POA: Diagnosis not present

## 2020-12-21 DIAGNOSIS — J9621 Acute and chronic respiratory failure with hypoxia: Secondary | ICD-10-CM | POA: Diagnosis not present

## 2020-12-21 DIAGNOSIS — J9601 Acute respiratory failure with hypoxia: Secondary | ICD-10-CM | POA: Diagnosis not present

## 2020-12-21 DIAGNOSIS — I5023 Acute on chronic systolic (congestive) heart failure: Secondary | ICD-10-CM | POA: Diagnosis not present

## 2020-12-21 DIAGNOSIS — I130 Hypertensive heart and chronic kidney disease with heart failure and stage 1 through stage 4 chronic kidney disease, or unspecified chronic kidney disease: Secondary | ICD-10-CM | POA: Diagnosis not present

## 2020-12-21 DIAGNOSIS — R262 Difficulty in walking, not elsewhere classified: Secondary | ICD-10-CM | POA: Diagnosis not present

## 2020-12-21 DIAGNOSIS — L89152 Pressure ulcer of sacral region, stage 2: Secondary | ICD-10-CM | POA: Diagnosis not present

## 2020-12-21 DIAGNOSIS — R0602 Shortness of breath: Secondary | ICD-10-CM | POA: Diagnosis not present

## 2020-12-21 DIAGNOSIS — I10 Essential (primary) hypertension: Secondary | ICD-10-CM | POA: Diagnosis not present

## 2020-12-21 DIAGNOSIS — D649 Anemia, unspecified: Secondary | ICD-10-CM | POA: Diagnosis not present

## 2020-12-22 DIAGNOSIS — R262 Difficulty in walking, not elsewhere classified: Secondary | ICD-10-CM | POA: Diagnosis not present

## 2020-12-22 DIAGNOSIS — I5023 Acute on chronic systolic (congestive) heart failure: Secondary | ICD-10-CM | POA: Diagnosis not present

## 2020-12-22 DIAGNOSIS — N184 Chronic kidney disease, stage 4 (severe): Secondary | ICD-10-CM | POA: Diagnosis not present

## 2020-12-22 DIAGNOSIS — J9621 Acute and chronic respiratory failure with hypoxia: Secondary | ICD-10-CM | POA: Diagnosis not present

## 2020-12-24 ENCOUNTER — Other Ambulatory Visit: Payer: Self-pay | Admitting: Family Medicine

## 2020-12-27 ENCOUNTER — Telehealth: Payer: Medicare HMO

## 2020-12-27 NOTE — Progress Notes (Deleted)
[facility]  [address] [place],  [place]  [address] [phone]  Clinic Day:  09/29/2020  Referring physician: [name], MD   HISTORY OF PRESENT ILLNESS:  The patient is a 87 y.o. female with anemia secondary to both iron deficiency and renal insufficiency.  The patient comes in today to reassess her iron and hemoglobin levels after receiving IV Feraheme.  Since receiving her IV iron, the patient has been doing  PHYSICAL EXAM:  There were no vitals taken for this visit. Wt Readings from Last 3 Encounters:  12/09/20 165 lb (74.8 kg)  10/18/20 193 lb 12 oz (87.9 kg)  10/11/20 188 lb (85.3 kg)   There is no height or weight on file to calculate BMI. Performance status (ECOG): 2 - Symptomatic, <50% confined to bed Physical Exam Constitutional:      Appearance: Normal appearance. She is not ill-appearing.  HENT:     Mouth/Throat:     Mouth: Mucous membranes are moist.     Pharynx: Oropharynx is clear. No oropharyngeal exudate or posterior oropharyngeal erythema.  Cardiovascular:     Rate and Rhythm: Normal rate and regular rhythm.     Heart sounds: No murmur heard. No friction rub. No gallop.   Pulmonary:     Effort: Pulmonary effort is normal. No respiratory distress.     Breath sounds: Normal breath sounds. No wheezing, rhonchi or rales.  Chest:  Breasts:     Right: No axillary adenopathy or supraclavicular adenopathy.     Left: No axillary adenopathy or supraclavicular adenopathy.    Abdominal:     General: Bowel sounds are normal. There is no distension.     Palpations: Abdomen is soft. There is no mass.     Tenderness: There is no abdominal tenderness.  Musculoskeletal:        General: No swelling.     Right lower leg: No edema.     Left lower leg: No edema.  Lymphadenopathy:     Cervical: No cervical adenopathy.     Upper Body:     Right upper body: No supraclavicular or axillary adenopathy.     Left upper body: No  supraclavicular or axillary adenopathy.     Lower Body: No right inguinal adenopathy. No left inguinal adenopathy.  Skin:    General: Skin is warm.     Coloration: Skin is not jaundiced.     Findings: No lesion or rash.  Neurological:     General: No focal deficit present.     Mental Status: She is alert and oriented to person, place, and time. Mental status is at baseline.     Cranial Nerves: Cranial nerves are intact.  Psychiatric:        Mood and Affect: Mood normal.        Behavior: Behavior normal.        Thought Content: Thought content normal.    LABS:         Ref. Range 09/29/2020 00:00  Sodium Latest Ref Range: 137 - 147  135 (A)  Potassium Latest Ref Range: 3.4 - 5.3  4.5  Chloride Latest Ref Range: 99 - 108  100  CO2 Latest Ref Range: 13 - 22  28 (A)  Glucose Unknown 109  BUN Latest Ref Range: 4 - 21  48 (A)  Creatinine Latest Ref Range: 0.5 - 1.1  1.6 (A)  Calcium Latest Ref Range: 8.7 - 10.7  9.7  Alkaline Phosphatase Latest Ref Range: 25 -  125  44  Albumin Latest Ref Range: 3.5 - 5.0  3.5  AST Latest Ref Range: 13 - 35  35  ALT Latest Ref Range: 7 - 35  18  Bilirubin, Total Unknown 0.6    ASSESSMENT & PLAN:  Assessment/Plan:  A 87 y.o. female with anemia secondary to iron deficiency and kidney disease.  Based upon her labs today, she has had an excellent response to her  IV Feraheme.  Clinically, she is doing well.  I will see her back in  months to reassess her iron and hemoglobin levels.  The patient understands all the plans discussed today and is in agreement with them.    Dequincy Macarthur Critchley, MD

## 2020-12-28 ENCOUNTER — Inpatient Hospital Stay: Payer: Medicare HMO | Admitting: Oncology

## 2020-12-28 ENCOUNTER — Inpatient Hospital Stay: Payer: Medicare HMO | Attending: Oncology

## 2020-12-28 DIAGNOSIS — L89152 Pressure ulcer of sacral region, stage 2: Secondary | ICD-10-CM | POA: Diagnosis not present

## 2020-12-31 ENCOUNTER — Ambulatory Visit: Payer: Medicare HMO | Admitting: Family Medicine

## 2021-01-02 DIAGNOSIS — K922 Gastrointestinal hemorrhage, unspecified: Secondary | ICD-10-CM | POA: Diagnosis not present

## 2021-01-02 DIAGNOSIS — I5023 Acute on chronic systolic (congestive) heart failure: Secondary | ICD-10-CM | POA: Diagnosis not present

## 2021-01-02 DIAGNOSIS — D631 Anemia in chronic kidney disease: Secondary | ICD-10-CM | POA: Diagnosis not present

## 2021-01-02 DIAGNOSIS — E875 Hyperkalemia: Secondary | ICD-10-CM | POA: Diagnosis not present

## 2021-01-02 DIAGNOSIS — D509 Iron deficiency anemia, unspecified: Secondary | ICD-10-CM | POA: Diagnosis not present

## 2021-01-02 DIAGNOSIS — N184 Chronic kidney disease, stage 4 (severe): Secondary | ICD-10-CM | POA: Diagnosis not present

## 2021-01-02 DIAGNOSIS — I130 Hypertensive heart and chronic kidney disease with heart failure and stage 1 through stage 4 chronic kidney disease, or unspecified chronic kidney disease: Secondary | ICD-10-CM | POA: Diagnosis not present

## 2021-01-02 DIAGNOSIS — E1122 Type 2 diabetes mellitus with diabetic chronic kidney disease: Secondary | ICD-10-CM | POA: Diagnosis not present

## 2021-01-02 DIAGNOSIS — K256 Chronic or unspecified gastric ulcer with both hemorrhage and perforation: Secondary | ICD-10-CM | POA: Diagnosis not present

## 2021-01-03 ENCOUNTER — Telehealth: Payer: Self-pay

## 2021-01-03 DIAGNOSIS — K922 Gastrointestinal hemorrhage, unspecified: Secondary | ICD-10-CM | POA: Diagnosis not present

## 2021-01-03 DIAGNOSIS — D509 Iron deficiency anemia, unspecified: Secondary | ICD-10-CM | POA: Diagnosis not present

## 2021-01-03 DIAGNOSIS — K256 Chronic or unspecified gastric ulcer with both hemorrhage and perforation: Secondary | ICD-10-CM | POA: Diagnosis not present

## 2021-01-03 DIAGNOSIS — I5023 Acute on chronic systolic (congestive) heart failure: Secondary | ICD-10-CM | POA: Diagnosis not present

## 2021-01-03 DIAGNOSIS — N184 Chronic kidney disease, stage 4 (severe): Secondary | ICD-10-CM | POA: Diagnosis not present

## 2021-01-03 DIAGNOSIS — E875 Hyperkalemia: Secondary | ICD-10-CM | POA: Diagnosis not present

## 2021-01-03 DIAGNOSIS — I130 Hypertensive heart and chronic kidney disease with heart failure and stage 1 through stage 4 chronic kidney disease, or unspecified chronic kidney disease: Secondary | ICD-10-CM | POA: Diagnosis not present

## 2021-01-03 DIAGNOSIS — E1122 Type 2 diabetes mellitus with diabetic chronic kidney disease: Secondary | ICD-10-CM | POA: Diagnosis not present

## 2021-01-03 DIAGNOSIS — D631 Anemia in chronic kidney disease: Secondary | ICD-10-CM | POA: Diagnosis not present

## 2021-01-03 NOTE — Telephone Encounter (Signed)
[name], [facility] PT, left VM requesting verbal orders.   Returned call. Verbal orders given for PT to see pt. Schedule: twice for three weeks, once for five weeks.   Royce Macadamia, [place] 01/03/21 3:50 PM

## 2021-01-04 ENCOUNTER — Telehealth: Payer: Self-pay

## 2021-01-04 ENCOUNTER — Ambulatory Visit (INDEPENDENT_AMBULATORY_CARE_PROVIDER_SITE_OTHER): Payer: Medicare HMO

## 2021-01-04 ENCOUNTER — Other Ambulatory Visit: Payer: Self-pay

## 2021-01-04 DIAGNOSIS — K256 Chronic or unspecified gastric ulcer with both hemorrhage and perforation: Secondary | ICD-10-CM | POA: Diagnosis not present

## 2021-01-04 DIAGNOSIS — D509 Iron deficiency anemia, unspecified: Secondary | ICD-10-CM | POA: Diagnosis not present

## 2021-01-04 DIAGNOSIS — I4821 Permanent atrial fibrillation: Secondary | ICD-10-CM

## 2021-01-04 DIAGNOSIS — E1122 Type 2 diabetes mellitus with diabetic chronic kidney disease: Secondary | ICD-10-CM | POA: Diagnosis not present

## 2021-01-04 DIAGNOSIS — I130 Hypertensive heart and chronic kidney disease with heart failure and stage 1 through stage 4 chronic kidney disease, or unspecified chronic kidney disease: Secondary | ICD-10-CM | POA: Diagnosis not present

## 2021-01-04 DIAGNOSIS — D631 Anemia in chronic kidney disease: Secondary | ICD-10-CM | POA: Diagnosis not present

## 2021-01-04 DIAGNOSIS — K922 Gastrointestinal hemorrhage, unspecified: Secondary | ICD-10-CM | POA: Diagnosis not present

## 2021-01-04 DIAGNOSIS — I5023 Acute on chronic systolic (congestive) heart failure: Secondary | ICD-10-CM | POA: Diagnosis not present

## 2021-01-04 DIAGNOSIS — E875 Hyperkalemia: Secondary | ICD-10-CM | POA: Diagnosis not present

## 2021-01-04 DIAGNOSIS — N184 Chronic kidney disease, stage 4 (severe): Secondary | ICD-10-CM | POA: Diagnosis not present

## 2021-01-04 MED ORDER — OXYCODONE-ACETAMINOPHEN 7.5-325 MG PO TABS: 1.0000 | ORAL_TABLET | Freq: Four times a day (QID) | ORAL | 0 refills | Status: DC | PRN

## 2021-01-04 NOTE — Telephone Encounter (Signed)
Tiffany, RN w/ [facility] left VM requesting verbal orders for medication and medical management for pt. Schedule: once a week for one week, twice a week for two weeks, and once a week for four weeks.   Attempted to call RN to give verbal, no answer. Left detailed verbal orders on VM, w/ identifying name, title, organization.   Royce Macadamia, [place] 01/04/21 10:50 AM

## 2021-01-04 NOTE — Telephone Encounter (Signed)
Baird Lyons, [facility], requesting to see pt once more and possibly recert later in week.   Returned call, gave verbal orders for this visit.   Royce Macadamia, [place] 01/04/21 10:20 AM

## 2021-01-05 ENCOUNTER — Other Ambulatory Visit: Payer: Self-pay | Admitting: Physician Assistant

## 2021-01-05 ENCOUNTER — Telehealth: Payer: Self-pay

## 2021-01-05 ENCOUNTER — Other Ambulatory Visit: Payer: Self-pay

## 2021-01-05 MED ORDER — OXYCODONE-ACETAMINOPHEN 7.5-325 MG PO TABS: 1.0000 | ORAL_TABLET | Freq: Four times a day (QID) | ORAL | 0 refills | Status: DC | PRN

## 2021-01-05 NOTE — Progress Notes (Signed)
Chronic Care Management Pharmacy Assistant   Name: [name]  MRN: [id number] DOB: May 19, 1934   Reason for Encounter: Follow up from hospital release      Medications: Outpatient Encounter Medications as of 01/05/2021  Medication Sig  . Accu-Chek FastClix Lancets MISC TEST BLOOD SUGAR TWICE DAILY E11.69  . Alcohol Swabs (B-D SINGLE USE SWABS REGULAR) PADS USE TWICE DAILY  E11.69  . allopurinol (ZYLOPRIM) 100 MG tablet   . ALPRAZolam (XANAX) 0.5 MG tablet TAKE ONE TABLET BY MOUTH EVERY NIGHT AT BEDTIME FOR INSOMNIA  . amLODipine (NORVASC) 5 MG tablet TAKE 1 TABLET EVERY DAY  . Apple Cider Vinegar 188 MG CAPS Take 2 capsules by mouth daily.  . bumetanide (BUMEX) 1 MG tablet Take 1 tablet (1 mg total) by mouth 2 (two) times daily.  . calcium carbonate (OSCAL) 1500 (600 Ca) MG TABS tablet Take by mouth.  . digoxin (LANOXIN) 0.125 MG tablet TAKE 1/2 TABLET EVERY DAY (SUBSTITUTED FOR  DIGITEK)  . DROPLET PEN NEEDLES 31G X 5 MM MISC USE AS DIRECTED WITH LEVEMIR  . DULoxetine (CYMBALTA) 20 MG capsule TAKE 1 CAPSULE BY MOUTH AT BEDTIME.  Marland Kitchen ezetimibe (ZETIA) 10 MG tablet TAKE 1 TABLET EVERY DAY  . fenofibrate micronized (LOFIBRA) 134 MG capsule TAKE 1 CAPSULE (134 MG TOTAL) BY MOUTH DAILY.  . fluticasone (FLONASE) 50 MCG/ACT nasal spray USE 2 SPRAYS IN EACH NOSTRIL EVERY DAY AS NEEDED  . glucose blood (ACCU-CHEK SMARTVIEW) test strip CHECK BLOOD SUGAR TWICE DAILY  . levothyroxine (SYNTHROID) 50 MCG tablet TAKE 1 TABLET EVERY DAY  . losartan (COZAAR) 100 MG tablet TAKE 1 TABLET EVERY DAY  . magnesium oxide (MAG-OX) 400 MG tablet Take 400 mg by mouth 2 (two) times daily.  . Multiple Vitamin (MULTIVITAMIN WITH MINERALS) TABS tablet Take 1 tablet by mouth daily. Centrum Silver  . nystatin cream (MYCOSTATIN)   . olopatadine (PATANOL) 0.1 % ophthalmic solution Place 1 drop into both eyes daily.  Marland Kitchen omega-3 acid ethyl esters (LOVAZA) 1 g capsule TAKE 1 CAPSULE TWICE DAILY  .  OVER THE COUNTER MEDICATION Take 2 tablets by mouth daily. Takes Beet Root tablets - 2 tablets daily   . oxyCODONE-acetaminophen (PERCOCET) 7.5-325 MG tablet Take 1 tablet by mouth every 6 (six) hours as needed (back pain.).  Marland Kitchen polyethylene glycol (MIRALAX / GLYCOLAX) 17 g packet Take 17 g by mouth daily as needed.   . pravastatin (PRAVACHOL) 10 MG tablet TAKE 1 TABLET FOUR TIMES WEEKLY  . rOPINIRole (REQUIP) 1 MG tablet TAKE 1 TABLET AT BEDTIME  . spironolactone (ALDACTONE) 25 MG tablet TAKE 1/2 TABLET BY MOUTH EVERY DAY  . tiZANidine (ZANAFLEX) 4 MG tablet TAKE 1 TABLET AT BEDTIME  . XARELTO 15 MG TABS tablet TAKE 1 TABLET DAILY WITH SUPPER.   No facility-administered encounter medications on file as of 01/05/2021.    Chart review noted the patient is trying to get set up with home physical therapy.  I called the patient she stated they came yesterday to sign her up for therapy, she stated her knee is giving her a lot of pain.  It was xrayed and the doctor said it was a bad case of arthritis.  She said the doctor mentioned surgery, but she does not want to do that, she is hoping Dr. Tobie Hunt will put an injection in it. She mentioned she was scheduled to get one before she went into the hospital.   The patient stated she gets her medication  from [facility] and [facility].  [name] and sent over some new medications and her daughter went and got them for her, but she wasn't sure what they were.  The patient did request a refill of O[facility]codone be sent to [facility], I have notified [name], CPP.  The patient mentioned that she had a follow up with [name], CPP on 01/10/21, and an office visit with Dr. Tobie Hunt on 01/11/21.    [name], [place] Pharmacist Assistant [phone]

## 2021-01-05 NOTE — Telephone Encounter (Signed)
[name], OT w/ [facility] left VM requesting verbal orders for POC approval.   Returned call. Verbal orders given. Schedule: twice a week for two weeks then once a week for six weeks.   Harrell Lark 01/05/21 2:38 PM

## 2021-01-06 DIAGNOSIS — L89316 Pressure-induced deep tissue damage of right buttock: Secondary | ICD-10-CM | POA: Diagnosis not present

## 2021-01-06 DIAGNOSIS — K256 Chronic or unspecified gastric ulcer with both hemorrhage and perforation: Secondary | ICD-10-CM | POA: Diagnosis not present

## 2021-01-06 DIAGNOSIS — I130 Hypertensive heart and chronic kidney disease with heart failure and stage 1 through stage 4 chronic kidney disease, or unspecified chronic kidney disease: Secondary | ICD-10-CM | POA: Diagnosis not present

## 2021-01-06 DIAGNOSIS — D509 Iron deficiency anemia, unspecified: Secondary | ICD-10-CM | POA: Diagnosis not present

## 2021-01-06 DIAGNOSIS — E1122 Type 2 diabetes mellitus with diabetic chronic kidney disease: Secondary | ICD-10-CM | POA: Diagnosis not present

## 2021-01-06 DIAGNOSIS — N184 Chronic kidney disease, stage 4 (severe): Secondary | ICD-10-CM | POA: Diagnosis not present

## 2021-01-06 DIAGNOSIS — D631 Anemia in chronic kidney disease: Secondary | ICD-10-CM | POA: Diagnosis not present

## 2021-01-06 DIAGNOSIS — Q288 Other specified congenital malformations of circulatory system: Secondary | ICD-10-CM | POA: Diagnosis not present

## 2021-01-06 DIAGNOSIS — I5023 Acute on chronic systolic (congestive) heart failure: Secondary | ICD-10-CM | POA: Diagnosis not present

## 2021-01-07 LAB — CUP PACEART REMOTE DEVICE CHECK
Battery Remaining Longevity: 89 mo
Battery Voltage: 2.98 V
Brady Statistic AP VP Percent: 0.09 %
Brady Statistic AP VS Percent: 99.42 %
Brady Statistic AS VP Percent: 0.01 %
Brady Statistic AS VS Percent: 0.48 %
Brady Statistic RA Percent Paced: 99.52 %
Brady Statistic RV Percent Paced: 0.1 %
Date Time Interrogation Session: 20220401004358
Implantable Lead Implant Date: 20080908
Implantable Lead Implant Date: 20080908
Implantable Lead Implant Date: 20190326
Implantable Lead Location: 753858
Implantable Lead Location: 753860
Implantable Lead Location: 753860
Implantable Lead Model: 3830
Implantable Lead Model: 4194
Implantable Lead Model: 7121
Implantable Pulse Generator Implant Date: 20190326
Lead Channel Impedance Value: 209 Ohm
Lead Channel Impedance Value: 266 Ohm
Lead Channel Impedance Value: 304 Ohm
Lead Channel Impedance Value: 323 Ohm
Lead Channel Impedance Value: 342 Ohm
Lead Channel Impedance Value: 380 Ohm
Lead Channel Impedance Value: 437 Ohm
Lead Channel Impedance Value: 456 Ohm
Lead Channel Impedance Value: 456 Ohm
Lead Channel Pacing Threshold Amplitude: 1.125 V
Lead Channel Pacing Threshold Pulse Width: 0.4 ms
Lead Channel Sensing Intrinsic Amplitude: 7.625 mV
Lead Channel Sensing Intrinsic Amplitude: 7.625 mV
Lead Channel Sensing Intrinsic Amplitude: 8.875 mV
Lead Channel Sensing Intrinsic Amplitude: 8.875 mV
Lead Channel Setting Pacing Amplitude: 2 V
Lead Channel Setting Pacing Amplitude: 2.5 V
Lead Channel Setting Pacing Pulse Width: 0.4 ms
Lead Channel Setting Sensing Sensitivity: 1.2 mV

## 2021-01-10 ENCOUNTER — Other Ambulatory Visit: Payer: Self-pay

## 2021-01-10 ENCOUNTER — Telehealth: Payer: Self-pay

## 2021-01-10 ENCOUNTER — Ambulatory Visit (INDEPENDENT_AMBULATORY_CARE_PROVIDER_SITE_OTHER): Payer: Medicare HMO

## 2021-01-10 DIAGNOSIS — E1140 Type 2 diabetes mellitus with diabetic neuropathy, unspecified: Secondary | ICD-10-CM

## 2021-01-10 DIAGNOSIS — I5023 Acute on chronic systolic (congestive) heart failure: Secondary | ICD-10-CM | POA: Diagnosis not present

## 2021-01-10 DIAGNOSIS — I130 Hypertensive heart and chronic kidney disease with heart failure and stage 1 through stage 4 chronic kidney disease, or unspecified chronic kidney disease: Secondary | ICD-10-CM | POA: Diagnosis not present

## 2021-01-10 DIAGNOSIS — I5022 Chronic systolic (congestive) heart failure: Secondary | ICD-10-CM | POA: Diagnosis not present

## 2021-01-10 DIAGNOSIS — K256 Chronic or unspecified gastric ulcer with both hemorrhage and perforation: Secondary | ICD-10-CM | POA: Diagnosis not present

## 2021-01-10 DIAGNOSIS — L89316 Pressure-induced deep tissue damage of right buttock: Secondary | ICD-10-CM | POA: Diagnosis not present

## 2021-01-10 DIAGNOSIS — Q288 Other specified congenital malformations of circulatory system: Secondary | ICD-10-CM | POA: Diagnosis not present

## 2021-01-10 DIAGNOSIS — E1122 Type 2 diabetes mellitus with diabetic chronic kidney disease: Secondary | ICD-10-CM | POA: Diagnosis not present

## 2021-01-10 DIAGNOSIS — D631 Anemia in chronic kidney disease: Secondary | ICD-10-CM | POA: Diagnosis not present

## 2021-01-10 DIAGNOSIS — D509 Iron deficiency anemia, unspecified: Secondary | ICD-10-CM | POA: Diagnosis not present

## 2021-01-10 DIAGNOSIS — N184 Chronic kidney disease, stage 4 (severe): Secondary | ICD-10-CM | POA: Diagnosis not present

## 2021-01-10 NOTE — Telephone Encounter (Signed)
LM asking pt to call the office back to RS the apt. Apt has been canceled due to the provider being out of the office. DOS 01/11/2021 

## 2021-01-10 NOTE — Progress Notes (Signed)
Cancelled.  

## 2021-01-10 NOTE — Patient Instructions (Addendum)
Visit Information  Goals Addressed            This Visit's Progress   . Learn More About My Health       Timeframe:  Long-Range Goal Priority:  High Start Date:                             Expected End Date:                        Follow Up Date 05/12/2021    - tell my story and reason for my visit - repeat what I heard to make sure I understand - bring a list of my medicines to the visit    Why is this important?    The best way to learn about your health and care is by talking to the doctor and nurse.   They will answer your questions and give you information in the way that you like best.    Notes:     . Track and Manage Fluids and Swelling-Heart Failure       Timeframe:  Long-Range Goal Priority:  High Start Date:                             Expected End Date:                       Follow Up Date 05/12/2021    - call office if I gain more than 2 pounds in one day or 5 pounds in one week - keep legs up while sitting - track weight in diary - use salt in moderation - watch for swelling in feet, ankles and legs every day    Why is this important?    It is important to check your weight daily and watch how much salt and liquids you have.   It will help you to manage your heart failure.    Notes:     . Track and Manage Symptoms-Heart Failure       Timeframe:  Long-Range Goal Priority:  High Start Date:                             Expected End Date:                       Follow Up Date 07/12/2021    - eat more whole grains, fruits and vegetables, lean meats and healthy fats - know when to call the doctor - track symptoms and what helps feel better or worse    Why is this important?    You will be able to handle your symptoms better if you keep track of them.   Making some simple changes to your lifestyle will help.   Eating healthy is one thing you can do to take good care of yourself.    Notes:       Patient Care Plan: [facility] Pharmacy Care Plan     Problem Identified: dm, chf, hld   Priority: High  Onset Date: 01/10/2021    Long-Range Goal: Disease Management   Start Date: 01/10/2021  Expected End Date: 01/10/2022  This Visit's Progress: On track  Priority: High  Note:     Current Barriers:  . Unable to maintain control of heart  failure   Pharmacist Clinical Goal(s):  Marland Kitchen Patient will maintain control of heart failure as evidenced by symptom management and avoiding hospitalization  through collaboration with PharmD and provider.   Interventions: . 1:1 collaboration with [name], MD regarding development and update of comprehensive plan of care as evidenced by provider attestation and co-signature . Inter-disciplinary care team collaboration (see longitudinal plan of care) . Comprehensive medication review performed; medication list updated in electronic medical record  Diabetes (A1c goal 7.5%) -Controlled -Current medications: . accu-chek fast clix lancets . accu-chek smartview test strips -Medications previously tried: insulin   -Current home glucose readings . fasting glucose: 74 mg/dL not taking meds -Denies hypoglycemic/hyperglycemic symptoms -Current meal patterns:  . breakfast: daughter fixing breakfast eggs and grits . lunch: sandwich or peanut butter carckers  . dinner: family brings in meals - meat and vegetables . snacks: peanut butter crackers . drinks: water -Current exercise: working with PT -Educated on A1c and blood sugar goals; Prevention and management of hypoglycemic episodes; Benefits of routine self-monitoring of blood sugar; -Counseled to check feet daily and get yearly eye exams -Counseled on diet and exercise extensively Recommended to continue current medication  Heart Failure (Goal: manage symptoms and prevent exacerbations) -Controlled -Last ejection fraction: 83-41% -HF type: Systolic -NYHA Class: II (slight limitation of activity)  -Current treatment: . amlodipine 5 mg daily   . Losartan 100 mg daily  . Spironolactone 25 mg 1/2 tablet daily  -Medications previously tried: lasix, bumex -Current home BP/HR readings:  129-140/80 mmHg -Current dietary habits: children bringing in meals. States she is eating well.  -Current exercise habits: working with PT at home  -Educated on Benefits of medications for managing symptoms and prolonging life Importance of weighing daily; if you gain more than 3 pounds in one day or 5 pounds in one week,    Importance of blood pressure control -Counseled on diet and exercise extensively Recommended to continue current medication   Patient Goals/Self-Care Activities . Patient will:  - take medications as prescribed focus on medication adherence by using pill box check glucose daily, document, and provide at future appointments check blood pressure daily, document, and provide at future appointments  Follow Up Plan: Telephone follow up appointment with care management team member scheduled for: 05/12/2021      The patient verbalized understanding of instructions, educational materials, and care plan provided today and declined offer to receive copy of patient instructions, educational materials, and care plan.  Telephone follow up appointment with pharmacy team member scheduled for: ~05/12/2021  Burnice Logan, [facility]  Heart Failure Eating Plan Heart failure, also called congestive heart failure, occurs when your heart does not pump blood well enough to meet your body's needs for oxygen-rich blood. Heart failure is a long-term (chronic) condition. Living with heart failure can be challenging. Following your health care provider's instructions about a healthy lifestyle and working with a dietitian to choose the right foods may help to improve your symptoms. An eating plan for someone with heart failure will include changes that limit the intake of salt (sodium) and unhealthy fat. What are tips for following this plan? Reading food  labels  Check food labels for the amount of sodium per serving. Choose foods that have less than 140 mg (milligrams) of sodium in each serving.  Check food labels for the number of calories per serving. This is important if you need to limit your daily calorie intake to lose weight.  Check food labels for the serving size.  If you eat more than one serving, you will be eating more sodium and calories than what is listed on the label.  Look for foods that are labeled as "sodium-free," "very low sodium," or "low sodium." ? Foods labeled as "reduced sodium" or "lightly salted" may still have more sodium than what is recommended for you. Cooking  Avoid adding salt when cooking. Ask your health care provider or dietitian before using salt substitutes.  Season food with salt-free seasonings, spices, or herbs. Check the label of seasoning mixes to make sure they do not contain salt.  Cook with heart-healthy oils, such as olive, canola, soybean, or sunflower oil.  Do not fry foods. Cook foods using low-fat methods, such as baking, boiling, grilling, and broiling.  Limit unhealthy fats when cooking by: ? Removing the skin from poultry, such as chicken. ? Removing all visible fats from meats. ? Skimming the fat off from stews, soups, and gravies before serving them. Meal planning  Limit your intake of: ? Processed, canned, or prepackaged foods. ? Foods that are high in trans fat, such as fried foods. ? Sweets, desserts, sugary drinks, and other foods with added sugar. ? Full-fat dairy products, such as whole milk.  Eat a balanced diet. This may include: ? 4-5 servings of fruit each day and 4-5 servings of vegetables each day. At each meal, try to fill one-half of your plate with fruits and vegetables. ? Up to 6-8 servings of whole grains each day. ? Up to 2 servings of lean meat, poultry, or fish each day. One serving of meat is equal to 3 oz (85 g). This is about the same size as a deck of  cards. ? 2 servings of low-fat dairy each day. ? Heart-healthy fats. Healthy fats called omega-3 fatty acids are found in foods such as flaxseed and cold-water fish like sardines, salmon, and mackerel.  Aim to eat 25-35 g (grams) of fiber a day. Foods that are high in fiber include apples, broccoli, carrots, beans, peas, and whole grains.  Do not add salt or condiments that contain salt (such as soy sauce) to foods before eating.  When eating at a restaurant, ask that your food be prepared with less salt or no salt, if possible.  Try to eat 2 or more vegetarian meals each week.  Eat more home-cooked food and eat less restaurant, buffet, and fast food.   General information  Do not eat more than 2,300 mg of sodium a day. The amount of sodium that is recommended for you may be lower, depending on your condition.  Maintain a healthy body weight as directed. Ask your health care provider what a healthy weight is for you. ? Check your weight every day. ? Work with your health care provider and dietitian to make a plan that is right for you to lose weight or maintain your current weight.  Limit how much fluid you drink. Ask your health care provider or dietitian how much fluid you can have each day.  Limit or avoid alcohol as told by your health care provider or dietitian. Recommended foods Fruits All fresh, frozen, and canned fruits. Dried fruits, such as raisins, prunes, and cranberries. Vegetables All fresh vegetables. Vegetables that are frozen without sauce or added salt. Low-sodium or sodium-free canned vegetables. Grains Bread with less than 80 mg of sodium per slice. Whole-wheat pasta, quinoa, and Lovella Hardie rice. Oats and oatmeal. Barley. Victor. Grits and cream of wheat. Whole-grain and whole-wheat cold cereal. Meats and other  protein foods Lean cuts of meat. Skinless chicken and [place]. Fish with high omega-3 fatty acids, such as salmon, sardines, and other cold-water fishes. Eggs.  Dried beans, peas, and edamame. Unsalted nuts and nut butters. Dairy Low-fat or nonfat (skim) milk and dried milk. Rice milk, soy milk, and almond milk. Low-fat or nonfat yogurt. Small amounts of reduced-sodium block cheese. Low-sodium cottage cheese. Fats and oils Olive, canola, soybean, flaxseed, avocado, or sunflower oil. Sweets and desserts Applesauce. Granola bars. Sugar-free pudding and gelatin. Frozen fruit bars. Seasoning and other foods Fresh and dried herbs. Lemon or lime juice. Vinegar. Low-sodium ketchup. Salt-free marinades, salad dressings, sauces, and seasonings. The items listed above may not be a complete list of foods and beverages you can eat. Contact a dietitian for more information. Foods to avoid Fruits Fruits that are dried with sodium-containing preservatives. Vegetables Canned vegetables. Frozen vegetables with sauce or seasonings. Creamed vegetables. [place] fries. Onion rings. Pickled vegetables and sauerkraut. Grains Bread with more than 80 mg of sodium per slice. Hot or cold cereal with more than 140 mg sodium per serving. Salted pretzels and crackers. Prepackaged breadcrumbs. Bagels, croissants, and biscuits. Meats and other protein foods Ribs and chicken wings. Bacon, ham, pepperoni, bologna, salami, and packaged luncheon meats. Hot dogs, bratwurst, and sausage. Canned meat. Smoked meat and fish. Salted nuts and seeds. Dairy Whole milk, half-and-half, and cream. Buttermilk. Processed cheese, cheese spreads, and cheese curds. Regular cottage cheese. Feta cheese. Shredded cheese. String cheese. Fats and oils Butter, lard, shortening, ghee, and bacon fat. Canned and packaged gravies. Seasoning and other foods Onion salt, garlic salt, table salt, and sea salt. Marinades. Regular salad dressings. Relishes, pickles, and olives. Meat flavorings and tenderizers, and bouillon cubes. Horseradish, ketchup, and mustard. Worcestershire sauce. Teriyaki sauce, soy sauce  (including reduced sodium). Hot sauce and Tabasco sauce. Steak sauce, fish sauce, oyster sauce, and cocktail sauce. Taco seasonings. Barbecue sauce. Tartar sauce. The items listed above may not be a complete list of foods and beverages you should avoid. Contact a dietitian for more information. Summary  A heart failure eating plan includes changes that limit your intake of sodium and unhealthy fat, and it may help you lose weight or maintain a healthy weight. Your health care provider may also recommend limiting how much fluid you drink.  Most people with heart failure should eat no more than 2,300 mg of salt (sodium) a day. The amount of sodium that is recommended for you may be lower, depending on your condition.  Contact your health care provider or dietitian before making any major changes to your diet. This information is not intended to replace advice given to you by your health care provider. Make sure you discuss any questions you have with your health care provider. Document Revised: 05/10/2020 Document Reviewed: 05/10/2020 Elsevier Patient Education  2021 [employer].

## 2021-01-10 NOTE — Progress Notes (Addendum)
Chronic Care Management Pharmacy Note  01/10/2021 Name:  [name] MRN:  [id number] DOB:  1934/04/20   Plan Recommendations:   Blood sugar well controlled without medication  Patient to see Dr. Tobie Poet tomorrow (01/11/21) for hospital follow-up. Pharmacist reconciled home medication list over the phone today. Patient reports bringing her medication list to visit as well.   Subjective: [name] is an 87 y.o. year old female who is a primary patient of Cox, Kirsten, MD.  The CCM team was consulted for assistance with disease management and care coordination needs.    Engaged with patient by telephone for follow up visit in response to provider referral for pharmacy case management and/or care coordination services.   Consent to Services:  The patient was given information about Chronic Care Management services, agreed to services, and gave verbal consent prior to initiation of services.  Please see initial visit note for detailed documentation.   Patient Care Team: [name], MD as PCP - General (Family Medicine) Evans Lance, MD as Consulting Physician (Cardiology) Sharyn Dross., DPM (Podiatry) Burnice Logan, [facility] as Pharmacist (Pharmacist) Berniece Salines, DO as Consulting Physician (Cardiology)  Recent office visits: 12/09/2020 - continue bumetanide 1 mg bid.   Recent consult visits: 12/10/2020-12/21/2020 - hospital admission and follow-up with Dr. Tobie Poet 1 week post SNF discharge. Follow-up renal ultrasound to evaluate left renal mass. Xarelto continued s/p PE.   Hospital visits: None in previous 6 months 12/10/2020-12/21/2020 - hospital admission and follow-up with Dr. Tobie Poet 1 week post SNF discharge. Follow-up renal ultrasound to evaluate left renal mass. Xarelto continued s/p PE.  Objective:  Lab Results  Component Value Date   CREATININE 1.41 (H) 12/03/2020   BUN 35 (H) 12/03/2020   GFR 38.32 (L) 11/30/2014   GFRNONAA 34 (L)  12/03/2020   GFRAA 39 (L) 12/03/2020   NA 144 12/03/2020   K 3.9 12/03/2020   CALCIUM 9.6 12/03/2020   CO2 24 12/03/2020   GLUCOSE 110 (H) 12/03/2020    Lab Results  Component Value Date/Time   HGBA1C 5.0 12/03/2020 11:53 AM   HGBA1C 5.7 (H) 08/26/2020 01:28 PM   GFR 38.32 (L) 11/30/2014 12:13 PM   GFR 35.46 (L) 04/13/2014 11:03 AM   MICROALBUR 10 12/25/2019 12:45 PM    Last diabetic Eye exam: No results found for: HMDIABEYEEXA  Last diabetic Foot exam: No results found for: HMDIABFOOTEX   Lab Results  Component Value Date   CHOL 99 (L) 08/26/2020   HDL 31 (L) 08/26/2020   LDLCALC 52 08/26/2020   TRIG 78 08/26/2020   CHOLHDL 3.2 08/26/2020    Hepatic Function Latest Ref Rng & Units 12/03/2020 09/29/2020 08/26/2020  Total Protein 6.0 - 8.5 g/dL 5.6(L) - 5.4(L)  Albumin 3.6 - 4.6 g/dL 3.6 3.5 3.1(L)  AST 0 - 40 IU/L 19 35 23  ALT 0 - 32 IU/L _0 Alk Phosphatase 44 - 121 IU/L 87 44 35(L)  Total Bilirubin 0.0 - 1.2 mg/dL 0.6 - 0.3    Lab Results  Component Value Date/Time   TSH 2.440 05/25/2020 12:15 PM   TSH 2.140 12/25/2019 10:58 AM    CBC Latest Ref Rng & Units 12/03/2020 09/29/2020 09/01/2020  WBC x10E3/uL CANCELED 5.4 6.5  Hemoglobin 12.0 - 16.0 - 8.0(A) 9.0(L)  Hematocrit 36 - 46 - 25(A) 28.4(L)  Platelets 150 - 399 - 381 434    Lab Results  Component Value Date/Time   VD25OH 61.7 08/12/2020 11:40  AM    Clinical ASCVD: No  The ASCVD Risk score Mikey Bussing DC Jr., et al., 2013) failed to calculate for the following reasons:   The 2013 ASCVD risk score is only valid for ages 56 to 75    Depression screen PHQ 2/9 11/23/2020 08/26/2020 02/16/2020  Decreased Interest 3 0 0  Down, Depressed, Hopeless 3 2 0  PHQ - 2 Score 6 2 0  Altered sleeping 0 2 -  Tired, decreased energy 3 3 -  Change in appetite 3 0 -  Feeling bad or failure about yourself  3 0 -  Trouble concentrating 3 0 -  Moving slowly or fidgety/restless 3 2 -  Suicidal thoughts 0 0 -  PHQ-9  Score 21 9 -  Difficult doing work/chores Extremely dIfficult - -      Social History   Tobacco Use  Smoking Status Never Smoker  Smokeless Tobacco Never Used   BP Readings from Last 3 Encounters:  12/09/20 122/62  12/03/20 (!) 142/72  11/23/20 132/68   Pulse Readings from Last 3 Encounters:  12/09/20 75  12/03/20 78  11/23/20 73   Wt Readings from Last 3 Encounters:  12/09/20 165 lb (74.8 kg)  10/18/20 193 lb 12 oz (87.9 kg)  10/11/20 188 lb (85.3 kg)   BMI Readings from Last 3 Encounters:  12/09/20 25.09 kg/m  12/03/20 29.46 kg/m  10/18/20 29.46 kg/m    Assessment/Interventions: Review of patient past medical history, allergies, medications, health status, including review of consultants reports, laboratory and other test data, was performed as part of comprehensive evaluation and provision of chronic care management services.   SDOH:  (Social Determinants of Health) assessments and interventions performed: Yes  SDOH Screenings   Alcohol Screen: Not on file  Depression (PHQ2-9): Medium Risk  . PHQ-2 Score: 21  Financial Resource Strain: Not on file  Food Insecurity: No Food Insecurity  . Worried About Charity fundraiser in the Last Year: Never true  . Ran Out of Food in the Last Year: Never true  Housing: Low Risk   . Last Housing Risk Score: 0  Physical Activity: Not on file  Social Connections: Not on file  Stress: Not on file  Tobacco Use: Low Risk   . Smoking Tobacco Use: Never Smoker  . Smokeless Tobacco Use: Never Used  Transportation Needs: No Transportation Needs  . Lack of Transportation (Medical): No  . Lack of Transportation (Non-Medical): No    CCM Care Plan  Allergies  Allergen Reactions  . Gabapentin Swelling  . Pregabalin Swelling       . Morphine Nausea Only  . Penicillins Rash and Other (See Comments)    Has patient had a PCN reaction causing immediate rash, facial/tongue/throat swelling, SOB or lightheadedness with  hypotension: No Has patient had a PCN reaction causing severe rash involving mucus membranes or skin necrosis: No Has patient had a PCN reaction that required hospitalization: No - MD office Has patient had a PCN reaction occurring within the last 10 years: No If all of the above answers are "NO", then may proceed with Cephalosporin use.   . Tape Rash  . Ace Inhibitors Other (See Comments)    Angioedema (ALLERGY/intolerance)  . Carvedilol Other (See Comments)    Myalgias (intolerance)  . Insulin Glargine Itching  . Metoprolol Other (See Comments)    Angioedema (ALLERGY/intolerance)  . Pantoprazole Nausea Only  . Procaine Other (See Comments)    unknown  . Statins Other (See Comments)  Myalgias (intolerance)  . Lisinopril Rash         Medications Reviewed Today    Reviewed by Burnice Logan, [facility] (Pharmacist) on 01/10/21 at 27  Med List Status: <None>  Medication Order Taking? Sig Documenting Provider Last Dose Status Informant  Accu-Chek FastClix Lancets MISC [id number] Yes TEST BLOOD SUGAR TWICE DAILY E11.69 Cox, Kirsten, MD Taking Active   Alcohol Swabs (B-D SINGLE USE SWABS REGULAR) PADS [id number] Yes USE TWICE DAILY  E11.69 Cox, Elnita Maxwell, MD Taking Active   allopurinol (ZYLOPRIM) 100 MG tablet [id number] Yes  [provider] Taking Active   ALPRAZolam Duanne Moron) 0.5 MG tablet [id number] Yes TAKE ONE TABLET BY MOUTH EVERY NIGHT AT BEDTIME FOR INSOMNIA Cox, Kirsten, MD Taking Active   amLODipine (NORVASC) 5 MG tablet [id number] Yes TAKE 1 TABLET EVERY DAY Cox, Kirsten, MD Taking Active   Apple Cider Vinegar 188 MG CAPS [id number] No Take 2 capsules by mouth daily.  Patient not taking: Reported on 01/10/2021   [provider] Not Taking Active   bumetanide (BUMEX) 1 MG tablet [id number] No Take 1 tablet (1 mg total) by mouth 2 (two) times daily.  Patient not taking: Reported on 01/10/2021   Berniece Salines, DO Not Taking Active   calcium carbonate (OSCAL) 1500 (600 Ca) MG  TABS tablet [id number] Yes Take 600 mg of elemental calcium by mouth 2 (two) times daily with a meal. [provider] Taking Active   digoxin (LANOXIN) 0.125 MG tablet [id number] Yes TAKE 1/2 TABLET EVERY DAY (SUBSTITUTED FOR  DIGITEK) Cox, Kirsten, MD Taking Active   DROPLET PEN NEEDLES 31G X 5 MM [place] [id number] No USE AS DIRECTED WITH LEVEMIR  Patient not taking: Reported on 01/10/2021   [name], MD Not Taking Active   DULoxetine (CYMBALTA) 20 MG capsule [id number] Yes TAKE 1 CAPSULE BY MOUTH AT BEDTIME. Cox, Kirsten, MD Taking Active   ezetimibe (ZETIA) 10 MG tablet [id number] Yes TAKE 1 TABLET EVERY DAY Cox, Kirsten, MD Taking Active   fenofibrate micronized (LOFIBRA) 134 MG capsule [id number] Yes TAKE 1 CAPSULE (134 MG TOTAL) BY MOUTH DAILY. Cox, Kirsten, MD Taking Active   fluticasone [facility]) 50 MCG/ACT nasal spray [id number] No USE 2 SPRAYS IN EACH NOSTRIL EVERY DAY AS NEEDED  Patient not taking: Reported on 01/10/2021   [name], MD Not Taking Active   glucose blood (ACCU-CHEK SMARTVIEW) test strip [id number] Yes CHECK BLOOD SUGAR TWICE DAILY Cox, Kirsten, MD Taking Active   levothyroxine (SYNTHROID) 50 MCG tablet [id number] Yes TAKE 1 TABLET EVERY DAY  Patient taking differently: Take 50 mcg by mouth daily before breakfast.   Cox, Kirsten, MD Taking Active   losartan (COZAAR) 100 MG tablet [id number] Yes TAKE 1 TABLET EVERY DAY Cox, Kirsten, MD Taking Active   Multiple Vitamin (MULTIVITAMIN WITH MINERALS) TABS tablet [id number] Yes Take 1 tablet by mouth daily. Centrum Silver [provider] Taking Active Self  Naphazoline-Pheniramine (OPCON-A) 0.027-0.315 % SOLN [id number] Yes Apply 1 drop to eye in the morning and at bedtime. [provider] Taking Active   nystatin cream (MYCOSTATIN) [id number] Yes Apply 1 application topically 3 (three) times daily. Cream to back rash [provider] Taking Active   olopatadine (PATANOL) 0.1 % ophthalmic solution  [id number] Yes Place 1 drop into both eyes 2 (two) times daily. [provider] Taking Active   omega-3 acid ethyl esters (LOVAZA) 1 g capsule [id number] Yes TAKE 1 CAPSULE TWICE DAILY  Patient taking differently: Take 1 g by mouth 2 (two)  times daily.   Cox, Kirsten, MD Taking Active   oxyCODONE-acetaminophen (PERCOCET) 7.5-325 MG tablet [id number] Yes Take 1 tablet by mouth every 6 (six) hours as needed (back pain.). Cox, Kirsten, MD Taking Active   polyethylene glycol (MIRALAX / GLYCOLAX) 17 g packet [id number] Yes Take 17 g by mouth daily as needed.  [provider] Taking Active   pravastatin (PRAVACHOL) 10 MG tablet [id number] Yes TAKE 1 TABLET FOUR TIMES WEEKLY Marge Duncans, PA-C Taking Active   rOPINIRole (REQUIP) 1 MG tablet [id number] Yes TAKE 1 TABLET AT BEDTIME Lillard Anes, MD Taking Active   senna (SENOKOT) 8.6 MG TABS tablet [id number] Yes Take 2 tablets by mouth daily. [provider] Taking Active   spironolactone (ALDACTONE) 25 MG tablet [id number] Yes TAKE 1/2 TABLET BY MOUTH EVERY DAY Marge Duncans, PA-C Taking Active   tiZANidine (ZANAFLEX) 4 MG tablet [id number] Yes TAKE 1 TABLET AT BEDTIME [name], MD Taking Active   XARELTO 15 MG TABS tablet [id number] Yes TAKE 1 TABLET DAILY WITH SUPPER. CoxElnita Maxwell, MD Taking Active           Patient Active Problem List   Diagnosis Date Noted  . Chronic kidney disease, stage 4 (severe) ([place])   . GAD (generalized anxiety disorder)   . Acute gastritis with hemorrhage 09/06/2020  . Iron deficiency anemia 09/06/2020  . Amiodarone pulmonary toxicity   . Anemia   . Arthritis   . Carotid artery occlusion   . CHF (congestive heart failure) ([place])   . Chronic a-fib ([place])   . Chronic kidney disease, stage IV (severe) ([place])   . Diabetes ([place])   . Fibromyalgia   . Full dentures   . GERD (gastroesophageal reflux disease)   . Hyperlipemia   . Hypertension   . Hypertensive heart disease with heart failure  ([place])   . Hypothalamic disease ([place])   . Hypothyroidism   . LBBB (left bundle branch block)   . Occlusion and stenosis of right carotid artery   . Other persistent atrial fibrillation ([place])   . Pneumonia   . Presence of automatic (implantable) cardiac defibrillator   . PVD (peripheral vascular disease) ([place])   . RAS (renal artery stenosis) ([place])   . Renal insufficiency, mild   . Restless leg syndrome   . Type 2 diabetes mellitus with diabetic neuropathy ([place])   . Wears glasses   . Mixed hyperlipidemia 12/25/2019  . Dyslipidemia associated with type 2 diabetes mellitus ([place]) 12/25/2019  . Secondary hypothyroidism 12/25/2019  . Diabetic polyneuropathy associated with diabetes mellitus due to underlying condition ([place]) 12/01/2019  . Primary osteoarthritis of left knee 12/01/2019  . Carotid artery stenosis 09/24/2018  . NSVT (nonsustained ventricular tachycardia) ([place]) 09/18/2018  . Hallux limitus of right foot 06/27/2016  . ATRIAL FIBRILLATION 12/26/2010  . VENTRICULAR FIBRILLATION 12/26/2010  . Chronic systolic heart failure ([place]) 12/26/2010  . Automatic implantable cardioverter-defibrillator in situ 12/26/2010  . Cardiomyopathy ([place]) 12/23/2010  . ATRIOVENTRICULAR BLOCK, 3RD DEGREE 12/23/2010  . AV BLOCK 12/23/2010  . ICD (implantable cardioverter-defibrillator), biventricular, in situ 12/23/2010  . Complete atrioventricular block ([place]) 12/23/2010  . H/O cardiovascular stress test 08/04/2010  . H/O echocardiogram 07/05/2010  . VT (ventricular tachycardia) ([place]) 06/2010    Immunization History  Administered Date(s) Administered  . Influenza-Unspecified 08/09/2018, 06/23/2019, 06/22/2020  . PFIZER(Purple Top)SARS-COV-2 Vaccination 12/18/2019, 08/14/2020  . Pneumococcal Conjugate-13 10/05/2014  . Pneumococcal Polysaccharide-23 06/11/2013  . Tdap 05/28/2017    Conditions to be addressed/monitored:  Diabetes and Heart Failure  Care Plan : CCM Pharmacy Care Plan  Updates  made by Burnice Logan, RPH since 01/10/2021 12:00 AM    Problem: dm, chf, hld   Priority: High  Onset Date: 01/10/2021    Long-Range Goal: Disease Management   Start Date: 01/10/2021  Expected End Date: 01/10/2022  This Visit's Progress: On track  Priority: High  Note:     Current Barriers:  . Unable to maintain control of heart failure   Pharmacist Clinical Goal(s):  Marland Kitchen Patient will maintain control of heart failure as evidenced by symptom management and avoiding hospitalization  through collaboration with PharmD and provider.   Interventions: . 1:1 collaboration with [name], MD regarding development and update of comprehensive plan of care as evidenced by provider attestation and co-signature . Inter-disciplinary care team collaboration (see longitudinal plan of care) . Comprehensive medication review performed; medication list updated in electronic medical record  Diabetes (A1c goal 7.5%) -Controlled -Current medications: . accu-chek fast clix lancets . accu-chek smartview test strips -Medications previously tried: insulin   -Current home glucose readings . fasting glucose: 74 mg/dL not taking meds -Denies hypoglycemic/hyperglycemic symptoms -Current meal patterns:  . breakfast: daughter fixing breakfast eggs and grits . lunch: sandwich or peanut butter carckers  . dinner: family brings in meals - meat and vegetables . snacks: peanut butter crackers . drinks: water -Current exercise: working with PT -Educated on A1c and blood sugar goals; Prevention and management of hypoglycemic episodes; Benefits of routine self-monitoring of blood sugar; -Counseled to check feet daily and get yearly eye exams -Counseled on diet and exercise extensively Recommended to continue current medication  Heart Failure (Goal: manage symptoms and prevent exacerbations) -Controlled -Last ejection fraction: 51-02% -HF type: Systolic -NYHA Class: II (slight limitation of  activity)  -Current treatment: . amlodipine 5 mg daily  . Losartan 100 mg daily  . Spironolactone 25 mg 1/2 tablet daily  -Medications previously tried: lasix, bumex -Current home BP/HR readings:  129-140/80 mmHg -Current dietary habits: children bringing in meals. States she is eating well.  -Current exercise habits: working with PT at home  -Educated on Benefits of medications for managing symptoms and prolonging life Importance of weighing daily; if you gain more than 3 pounds in one day or 5 pounds in one week,    Importance of blood pressure control -Counseled on diet and exercise extensively Recommended to continue current medication   Patient Goals/Self-Care Activities . Patient will:  - take medications as prescribed focus on medication adherence by using pill box check glucose daily, document, and provide at future appointments check blood pressure daily, document, and provide at future appointments  Follow Up Plan: Telephone follow up appointment with care management team member scheduled for: 05/12/2021      Medication Assistance: None required.  Patient affirms current coverage meets needs.  Patient's preferred pharmacy is:  [id number] [id number]: [phone]: [address] [place]Phone: [phone]: [phone] Uses pill box? Yes Pt endorses good compliance  We discussed: Current pharmacy is preferred with insurance plan and patient is satisfied with pharmacy services Patient decided to: Continue current medication management strategy  Care Plan and Follow Up Patient Decision:  Patient agrees to Care Plan and Follow-up.  Plan: Telephone follow up appointment with care management team member scheduled for:  05/2021

## 2021-01-11 ENCOUNTER — Ambulatory Visit (INDEPENDENT_AMBULATORY_CARE_PROVIDER_SITE_OTHER): Payer: Medicare HMO | Admitting: Family Medicine

## 2021-01-11 DIAGNOSIS — I5023 Acute on chronic systolic (congestive) heart failure: Secondary | ICD-10-CM

## 2021-01-12 ENCOUNTER — Other Ambulatory Visit: Payer: Self-pay | Admitting: Family Medicine

## 2021-01-12 DIAGNOSIS — Q288 Other specified congenital malformations of circulatory system: Secondary | ICD-10-CM | POA: Diagnosis not present

## 2021-01-12 DIAGNOSIS — I5023 Acute on chronic systolic (congestive) heart failure: Secondary | ICD-10-CM | POA: Diagnosis not present

## 2021-01-12 DIAGNOSIS — K256 Chronic or unspecified gastric ulcer with both hemorrhage and perforation: Secondary | ICD-10-CM | POA: Diagnosis not present

## 2021-01-12 DIAGNOSIS — D631 Anemia in chronic kidney disease: Secondary | ICD-10-CM | POA: Diagnosis not present

## 2021-01-12 DIAGNOSIS — E1122 Type 2 diabetes mellitus with diabetic chronic kidney disease: Secondary | ICD-10-CM | POA: Diagnosis not present

## 2021-01-12 DIAGNOSIS — I130 Hypertensive heart and chronic kidney disease with heart failure and stage 1 through stage 4 chronic kidney disease, or unspecified chronic kidney disease: Secondary | ICD-10-CM | POA: Diagnosis not present

## 2021-01-12 DIAGNOSIS — L89316 Pressure-induced deep tissue damage of right buttock: Secondary | ICD-10-CM | POA: Diagnosis not present

## 2021-01-12 DIAGNOSIS — N184 Chronic kidney disease, stage 4 (severe): Secondary | ICD-10-CM | POA: Diagnosis not present

## 2021-01-12 DIAGNOSIS — D509 Iron deficiency anemia, unspecified: Secondary | ICD-10-CM | POA: Diagnosis not present

## 2021-01-13 ENCOUNTER — Ambulatory Visit (INDEPENDENT_AMBULATORY_CARE_PROVIDER_SITE_OTHER): Payer: Medicare HMO | Admitting: Legal Medicine

## 2021-01-13 ENCOUNTER — Encounter: Payer: Self-pay | Admitting: Legal Medicine

## 2021-01-13 ENCOUNTER — Other Ambulatory Visit: Payer: Self-pay

## 2021-01-13 DIAGNOSIS — I110 Hypertensive heart disease with heart failure: Secondary | ICD-10-CM

## 2021-01-13 DIAGNOSIS — I4821 Permanent atrial fibrillation: Secondary | ICD-10-CM | POA: Diagnosis not present

## 2021-01-13 DIAGNOSIS — J9601 Acute respiratory failure with hypoxia: Secondary | ICD-10-CM

## 2021-01-13 DIAGNOSIS — E1122 Type 2 diabetes mellitus with diabetic chronic kidney disease: Secondary | ICD-10-CM | POA: Diagnosis not present

## 2021-01-13 DIAGNOSIS — E0842 Diabetes mellitus due to underlying condition with diabetic polyneuropathy: Secondary | ICD-10-CM

## 2021-01-13 DIAGNOSIS — E1140 Type 2 diabetes mellitus with diabetic neuropathy, unspecified: Secondary | ICD-10-CM | POA: Diagnosis not present

## 2021-01-13 DIAGNOSIS — M1712 Unilateral primary osteoarthritis, left knee: Secondary | ICD-10-CM

## 2021-01-13 DIAGNOSIS — Q288 Other specified congenital malformations of circulatory system: Secondary | ICD-10-CM | POA: Diagnosis not present

## 2021-01-13 DIAGNOSIS — D631 Anemia in chronic kidney disease: Secondary | ICD-10-CM | POA: Diagnosis not present

## 2021-01-13 DIAGNOSIS — Z95810 Presence of automatic (implantable) cardiac defibrillator: Secondary | ICD-10-CM

## 2021-01-13 DIAGNOSIS — N184 Chronic kidney disease, stage 4 (severe): Secondary | ICD-10-CM

## 2021-01-13 DIAGNOSIS — I5020 Unspecified systolic (congestive) heart failure: Secondary | ICD-10-CM

## 2021-01-13 DIAGNOSIS — E237 Disorder of pituitary gland, unspecified: Secondary | ICD-10-CM

## 2021-01-13 DIAGNOSIS — I130 Hypertensive heart and chronic kidney disease with heart failure and stage 1 through stage 4 chronic kidney disease, or unspecified chronic kidney disease: Secondary | ICD-10-CM | POA: Diagnosis not present

## 2021-01-13 DIAGNOSIS — I701 Atherosclerosis of renal artery: Secondary | ICD-10-CM

## 2021-01-13 DIAGNOSIS — D509 Iron deficiency anemia, unspecified: Secondary | ICD-10-CM | POA: Diagnosis not present

## 2021-01-13 DIAGNOSIS — L89316 Pressure-induced deep tissue damage of right buttock: Secondary | ICD-10-CM | POA: Diagnosis not present

## 2021-01-13 DIAGNOSIS — I5023 Acute on chronic systolic (congestive) heart failure: Secondary | ICD-10-CM | POA: Diagnosis not present

## 2021-01-13 DIAGNOSIS — K256 Chronic or unspecified gastric ulcer with both hemorrhage and perforation: Secondary | ICD-10-CM | POA: Diagnosis not present

## 2021-01-13 HISTORY — DX: Unspecified systolic (congestive) heart failure: I50.20

## 2021-01-13 HISTORY — DX: Morbid (severe) obesity due to excess calories: E66.01

## 2021-01-13 NOTE — Progress Notes (Signed)
Subjective:  Patient ID: [name], female    DOB: 10-Jan-1934  Age: 87 y.o. MRN: [id number]  Chief Complaint  Patient presents with  . Rehab discharge follow up    Patient was discharged from [facility] on 01/01/2021.     HPI: transition of care and reconciliation of medicines. Patient aDmitted for Radium  On 12/10/2020 for CHF and blood loss, her chf was treated with O2 and lasix.  And she was transfused 3 units packed RBC.  She had bleeding gastric ulcers.  She was discharged on 12/21/2020 and transferred to [facility].  She has gained her strength.  She is in wheel chair, she can walk short distances with walker.  She still is having home health nurse and PT at home. No further bleeding. She got Covdi booster shot at [facility]. cardiologist is Dr. Harriet Masson.  She has severe OA of knees, Dr. Tobie Poet promised to inject her knee.  EF 30-35%  Current Outpatient Medications on File Prior to Visit  Medication Sig Dispense Refill  . Accu-Chek FastClix Lancets MISC TEST BLOOD SUGAR TWICE DAILY E11.69 204 each 3  . Alcohol Swabs (B-D SINGLE USE SWABS REGULAR) PADS USE TWICE DAILY  E11.69 200 each 3  . allopurinol (ZYLOPRIM) 100 MG tablet     . ALPRAZolam (XANAX) 0.5 MG tablet TAKE ONE TABLET BY MOUTH EVERY NIGHT AT BEDTIME FOR INSOMNIA 90 tablet 0  . amLODipine (NORVASC) 5 MG tablet TAKE 1 TABLET EVERY DAY 90 tablet 1  . calcium carbonate (OSCAL) 1500 (600 Ca) MG TABS tablet Take 600 mg of elemental calcium by mouth 2 (two) times daily with a meal.    . DULoxetine (CYMBALTA) 20 MG capsule TAKE 1 CAPSULE BY MOUTH AT BEDTIME. 30 capsule 0  . ezetimibe (ZETIA) 10 MG tablet TAKE 1 TABLET EVERY DAY 90 tablet 1  . fenofibrate micronized (LOFIBRA) 134 MG capsule TAKE 1 CAPSULE (134 MG TOTAL) BY MOUTH DAILY. 90 capsule 1  . glucose blood (ACCU-CHEK SMARTVIEW) test strip CHECK BLOOD SUGAR TWICE DAILY 200 strip 3  . levothyroxine (SYNTHROID) 50 MCG tablet TAKE 1 TABLET EVERY DAY (Patient  taking differently: Take 50 mcg by mouth daily before breakfast.) 90 tablet 1  . losartan (COZAAR) 100 MG tablet TAKE 1 TABLET EVERY DAY 90 tablet 1  . Multiple Vitamin (MULTIVITAMIN WITH MINERALS) TABS tablet Take 1 tablet by mouth daily. Centrum Silver    . Naphazoline-Pheniramine (OPCON-A) 0.027-0.315 % SOLN Apply 1 drop to eye in the morning and at bedtime.    Marland Kitchen nystatin cream (MYCOSTATIN) Apply 1 application topically 3 (three) times daily. Cream to back rash    . olopatadine (PATANOL) 0.1 % ophthalmic solution Place 1 drop into both eyes 2 (two) times daily.    Marland Kitchen oxyCODONE-acetaminophen (PERCOCET) 7.5-325 MG tablet Take 1 tablet by mouth every 6 (six) hours as needed (back pain.). 120 tablet 0  . polyethylene glycol (MIRALAX / GLYCOLAX) 17 g packet Take 17 g by mouth daily as needed.     . pravastatin (PRAVACHOL) 10 MG tablet TAKE 1 TABLET FOUR TIMES WEEKLY 48 tablet 1  . rOPINIRole (REQUIP) 1 MG tablet TAKE 1 TABLET AT BEDTIME 90 tablet 2  . senna (SENOKOT) 8.6 MG TABS tablet Take 2 tablets by mouth daily.    Marland Kitchen spironolactone (ALDACTONE) 25 MG tablet TAKE 1/2 TABLET BY MOUTH EVERY DAY 45 tablet 1  . tiZANidine (ZANAFLEX) 4 MG tablet TAKE 1 TABLET AT BEDTIME 90 tablet 0  . Alveda Reasons  15 MG TABS tablet TAKE 1 TABLET DAILY WITH SUPPER. 90 tablet 3  . digoxin (LANOXIN) 0.125 MG tablet TAKE 1/2 TABLET EVERY DAY (SUBSTITUTED FOR  DIGITEK) 45 tablet 1  . omega-3 acid ethyl esters (LOVAZA) 1 g capsule TAKE 1 CAPSULE TWICE DAILY 180 capsule 1   No current facility-administered medications on file prior to visit.   Past Medical History:  Diagnosis Date  . Amiodarone pulmonary toxicity   . Anemia    no GI bleeding; on iron  . Arthritis   . Atrial fibrillation ([place])   . Biventricular ICD (implantable cardiac defibrillator) in place    BiV ICD for nonischemic cardiomyopathy; BiV responder  . Carotid artery occlusion   . CHF (congestive heart failure) ([place])   . Chronic a-fib ([place])    on xarelto;  failed DCCV  . Chronic kidney disease, stage 4 (severe) ([place])   . Diabetes ([place])   . Fibromyalgia   . Full dentures   . GAD (generalized anxiety disorder)   . GERD (gastroesophageal reflux disease)   . H/O cardiovascular stress test 08/04/2010   normal imaging; EF 61%  . H/O echocardiogram 07/05/2010   EF 45-50%; aortic valve-mildly sclerotic; LA-mod dilaterd   . Hyperlipemia   . Hypertension   . Hypertensive heart disease with heart failure ([place])   . Hypothalamic disease ([place])    on thyroid supplement  . Hypothyroidism   . Hypothyroidism   . LBBB (left bundle branch block)   . Mixed hyperlipidemia   . Occlusion and stenosis of right carotid artery   . Other persistent atrial fibrillation ([place])   . Pneumonia   . Presence of automatic (implantable) cardiac defibrillator   . PVD (peripheral vascular disease) ([place])    L RA stent 1994  . RAS (renal artery stenosis) ([place])    a. renal dopp (12/18/08)- Right RA-<60%; L RA stent- open; nl size and shape in both kidneys;  b.  Rena Artery [name] (3/16):  SMA with > 70% stenosis, Bilateral prox RA with 1-59%  . Renal insufficiency, mild    05/01/12 Creat 1.47  . Restless leg syndrome   . Type 2 diabetes mellitus with diabetic neuropathy ([place])   . VT (ventricular tachycardia) ([place]) 9/11   polymorphic VT probably related to sotalol therapy  . Wears glasses    Past Surgical History:  Procedure Laterality Date  . ABDOMINAL HYSTERECTOMY    . APPENDECTOMY    . AV NODE ABLATION  06/08/2006   performed by Dr. Beckie Salts; due to Afib with RVR and tachy brady syndrome  . BIV ICD GENERATOR CHANGEOUT N/A 12/31/2017   Procedure: BIV ICD GENERATOR CHANGEOUT;  Surgeon: Evans Lance, MD;  Location: [facility];  Service: Cardiovascular;  Laterality: N/A;  . BIV ICD GENERTAOR CHANGE OUT  11/15/10   Medtronic Protecta [id number] serial #[id number]  . BiV ICD Placement  06/17/2007; 04/20/14   Medtronic Concerta [id number]; RA lead-Med 5076/53cm,  [id number]; RV lead- SJM 7121/65cm, [id number]; LV lead- Med 4194/88cm, [id number]; gen change 04/2014 by Dr Lovena Le  . BIV PACEMAKER GENERATOR CHANGE OUT N/A 04/20/2014   Procedure: BIV PACEMAKER GENERATOR CHANGE OUT;  Surgeon: Evans Lance, MD;  Location: [facility] CATH LAB;  Service: Cardiovascular;  Laterality: N/A;  . BRAIN MENINGIOMA EXCISION  2011   L frontal meningioma at [facility]   . BREAST SURGERY     lumpectomy right  . CARDIOVERSION  07/21/05   converted to sinus brady  . CAROTID ENDARTERECTOMY Right 09/24/2018  with bovine pericardial patch angioplasty  . CATARACT EXTRACTION W/ INTRAOCULAR LENS  IMPLANT, BILATERAL    . CHOLECYSTECTOMY     Dr. Sherald Hess in [place] and Dr. Lyda Jester follows; surgical stricture post procedure with stent placement  . COLONOSCOPY W/ BIOPSIES AND POLYPECTOMY    . DILATION AND CURETTAGE OF UTERUS    . ENDARTERECTOMY Right 09/24/2018   Procedure: ENDARTERECTOMY CAROTID RIGHT;  Surgeon: Angelia Mould, MD;  Location: [facility];  Service: Vascular;  Laterality: Right;  . PV Angio  1994   L Renal artery stent     Family History  Problem Relation Age of Onset  . Heart failure Mother   . Hypertension Mother   . Cancer Father        lung  . Dementia Sister   . Cancer Sister        pancreatic, renal and thyroid  . Heart attack Brother   . AAA (abdominal aortic aneurysm) Brother   . Stroke Maternal Grandmother   . Diabetes Other   . Stroke Son    Social History   Socioeconomic History  . Marital status: Married    Spouse name: Not on file  . Number of children: 6  . Years of education: Not on file  . Highest education level: Not on file  Occupational History  . Not on file  Tobacco Use  . Smoking status: Never Smoker  . Smokeless tobacco: Never Used  Vaping Use  . Vaping Use: Never used  Substance and Sexual Activity  . Alcohol use: No    Alcohol/week: 0.0 standard drinks  . Drug use: No  . Sexual activity: Not Currently  Other  Topics Concern  . Not on file  Social History Narrative   wears sunscreen, brushes and flosses daily, see's dentist bi-annually, has smoke/carbon monoxide detectors, wears a seatbelt and practices gun safety   Social Determinants of Health   Financial Resource Strain: Not on file  Food Insecurity: No Food Insecurity  . Worried About Charity fundraiser in the Last Year: Never true  . Ran Out of Food in the Last Year: Never true  Transportation Needs: No Transportation Needs  . Lack of Transportation (Medical): No  . Lack of Transportation (Non-Medical): No  Physical Activity: Not on file  Stress: Not on file  Social Connections: Not on file    Review of Systems  Constitutional: Negative for activity change and appetite change.  HENT: Negative.   Eyes: Negative for visual disturbance.  Respiratory: Negative for chest tightness and shortness of breath.   Cardiovascular: Negative for chest pain, palpitations and leg swelling.  Gastrointestinal: Negative for abdominal distention and abdominal pain.  Genitourinary: Negative for difficulty urinating.  Musculoskeletal: Negative for arthralgias.  Skin: Negative.   Neurological: Negative.      Objective:  BP 126/70   Pulse 74   Temp (!) 97.4 F (36.3 C)   Resp 16   Ht 5\' 8"  (1.727 m)   Wt 140 lb (63.5 kg)   SpO2 99%   BMI 21.29 kg/m   BP/Weight 01/13/2021 12/09/2020 2/35/3614  Systolic BP 431 540 086  Diastolic BP 70 62 72  Wt. (Lbs) 140 165 -  BMI 21.29 25.09 29.46    Physical Exam Vitals reviewed.  Constitutional:      Appearance: Normal appearance. She is normal weight.  HENT:     Head: Normocephalic.     Right Ear: Tympanic membrane, ear canal and external ear normal.  Left Ear: Tympanic membrane, ear canal and external ear normal.     Mouth/Throat:     Mouth: Mucous membranes are moist.     Pharynx: Oropharynx is clear.  Eyes:     Extraocular Movements: Extraocular movements intact.     Conjunctiva/sclera:  Conjunctivae normal.     Pupils: Pupils are equal, round, and reactive to light.  Cardiovascular:     Rate and Rhythm: Normal rate.     Pulses: Normal pulses.     Heart sounds: No murmur heard. Gallop (S4) present.   Pulmonary:     Effort: Pulmonary effort is normal. No respiratory distress.     Breath sounds: Normal breath sounds. No rales.  Abdominal:     General: Abdomen is flat. Bowel sounds are normal. There is no distension.     Palpations: Abdomen is soft.     Tenderness: There is no abdominal tenderness.  Musculoskeletal:     Cervical back: Normal range of motion and neck supple.     Right lower leg: Edema present.     Left lower leg: Edema present.     Comments: Tender Left knee positive crepitation  Skin:    General: Skin is warm.     Capillary Refill: Capillary refill takes less than 2 seconds.  Neurological:     General: No focal deficit present.     Mental Status: She is alert and oriented to person, place, and time.     Motor: Weakness present.  Psychiatric:        Mood and Affect: Mood normal.        Thought Content: Thought content normal.       Lab Results  Component Value Date   WBC CANCELED 12/03/2020   HGB 8.0 (A) 09/29/2020   HCT 25 (A) 09/29/2020   PLT 381 09/29/2020   GLUCOSE 110 (H) 12/03/2020   CHOL 99 (L) 08/26/2020   TRIG 78 08/26/2020   HDL 31 (L) 08/26/2020   LDLCALC 52 08/26/2020   ALT 14 12/03/2020   AST 19 12/03/2020   NA 144 12/03/2020   K 3.9 12/03/2020   CL 105 12/03/2020   CREATININE 1.41 (H) 12/03/2020   BUN 35 (H) 12/03/2020   CO2 24 12/03/2020   TSH 2.440 05/25/2020   INR 0.96 09/24/2018   HGBA1C 5.0 12/03/2020   MICROALBUR 10 12/25/2019      Assessment & Plan:   Diagnoses and all orders for this visit: Morbid obesity ([place]) An individualize plan was formulated for obesity using patient history and physical exam to encourage weight loss.  An evidence based program was formulated.  Patient is to cut portion size  with meals and to plan physical exercise 3 days a week at least 20 minutes.  Weight watchers and other programs are helpful.  Planned amount of weight loss 10 lbs.  Renal artery stenosis [facility]) Patient has renal artery stenosis that is stable  Acute respiratory failure with hypoxia [facility]) Patient is requiring oxygen at times wih her CHF  Permanent atrial fibrillation [facility]) Patient has a diagnosis of permanent atrial fibrillation.   Patient is on xarelto and has controlled ventricular response.  Patient is CV stable . HFrEF (heart failure with reduced ejection fraction) ([place]) An individualized care plan was established and reinforced.  The patient's disease status was assessed using clinical finding son exam today, labs, and/or other diagnostic testing such as x-rays, to determine the patient's success in meeting treatmentgoalsbased on disease-based guidelines and found to beimproving. But  not at goal yet. Medications prescriptions no changes Laboratory tests ordered to be performed today include routine. RECOMMENDATIONS: given include see cardiology.  Call physician is patient gains 3 lbs in one day or 5 lbs for one week.  Call for progressive PND, orthopnea or increased pedal edema.  Hypertensive heart disease with heart failure [facility]) An individual hypertension care plan was established and reinforced today.  The patient's status was assessed using clinical findings on exam and labs or diagnostic tests. The patient's success at meeting treatment goals on disease specific evidence-based guidelines and found to be well controlled. SELF MANAGEMENT: The patient and I together assessed ways to personally work towards obtaining the recommended goals. RECOMMENDATIONS: avoid decongestants found in common cold remedies, decrease consumption of alcohol, perform routine monitoring of BP with home BP cuff, exercise, reduction of dietary salt, take medicines as prescribed, try not to miss doses and quit  smoking.  Regular exercise and maintaining a healthy weight is needed.  Stress reduction may help. A CLINICAL SUMMARY including written plan identify barriers to care unique to individual due to social or financial issues.  We attempt to mutually creat solutions for  individual and family understanding.  Diabetic polyneuropathy associated with diabetes mellitus due to underlying condition [facility]) An individual care plan for diabetes was established and reinforced today.  The patient's status was assessed using clinical findings on exam, labs and diagnostic testing. Patient success at meeting goals based on disease specific evidence-based guidelines and found to be fair controlled. Medications were assessed and patient's understanding of the medical issues , including barriers were assessed. Recommend adherence to a diabetic diet, a graduated exercise program, HgbA1c level is checked quarterly, and urine microalbumin performed yearly .  Annual mono-filament sensation testing performed. Lower blood pressure and control hyperlipidemia is important. Get annual eye exams and annual flu shots and smoking cessation discussed.  Self management goals were discussed.  Type 2 diabetes mellitus with diabetic neuropathy, unspecified whether long term insulin use ([place]) An individual care plan for diabetes was established and reinforced today.  The patient's status was assessed using clinical findings on exam, labs and diagnostic testing. Patient success at meeting goals based on disease specific evidence-based guidelines and found to be fair controlled. Medications were assessed and patient's understanding of the medical issues , including barriers were assessed. Recommend adherence to a diabetic diet, a graduated exercise program, HgbA1c level is checked quarterly, and urine microalbumin performed yearly .  Annual mono-filament sensation testing performed. Lower blood pressure and control hyperlipidemia is important.  Get annual eye exams and annual flu shots and smoking cessation discussed.  Self management goals were discussed.  Primary osteoarthritis of left knee Right knee oa, injected today per patient request  Chronic kidney disease, stage 4 (severe) ([place]) AN INDIVIDUAL CARE PLAN chronic renal disease was established and reinforced today.  The patient's status was assessed using clinical findings on exam, labs, and other diagnostic testing. Patient's success at meeting treatment goals based on disease specific evidence-bassed guidelines and found to be in fair control. RECOMMENDATIONS include maintaining present medicines and treatment.  Automatic implantable cardioverter-defibrillator in situ Patient has functioning defibrillator  Patient has pacemaker that is working well  After consent was obtained, using sterile technique the left knee was prepped and Ethyl Chloride  was used as local anesthetic. The joint was entered and .  Steroid 80 mg and 3cc ml plain Lidocaine was then injected and the needle withdrawn.  The procedure was well tolerated.  The  patient is asked to continue to rest the joint for a few more days before resuming regular activities.  It may be more painful for the first 1-2 days.  Watch for fever, or increased swelling or persistent pain in the joint. Call or return to clinic prn if such symptoms occur or there is failure to improve as anticipated.  40 minutes to review records and examine, extra time to inject left knee    Follow-up: Return in about 4 weeks (around 02/10/2021) for follow up of hospital.  An After Visit Summary was printed and given to the patient.  Reinaldo Meeker, MD [facility] [phone]

## 2021-01-13 NOTE — Telephone Encounter (Signed)
You just saw pt

## 2021-01-14 DIAGNOSIS — K256 Chronic or unspecified gastric ulcer with both hemorrhage and perforation: Secondary | ICD-10-CM | POA: Diagnosis not present

## 2021-01-14 DIAGNOSIS — N184 Chronic kidney disease, stage 4 (severe): Secondary | ICD-10-CM | POA: Diagnosis not present

## 2021-01-14 DIAGNOSIS — L89316 Pressure-induced deep tissue damage of right buttock: Secondary | ICD-10-CM | POA: Diagnosis not present

## 2021-01-14 DIAGNOSIS — D509 Iron deficiency anemia, unspecified: Secondary | ICD-10-CM | POA: Diagnosis not present

## 2021-01-14 DIAGNOSIS — Q288 Other specified congenital malformations of circulatory system: Secondary | ICD-10-CM | POA: Diagnosis not present

## 2021-01-14 DIAGNOSIS — E1122 Type 2 diabetes mellitus with diabetic chronic kidney disease: Secondary | ICD-10-CM | POA: Diagnosis not present

## 2021-01-14 DIAGNOSIS — I130 Hypertensive heart and chronic kidney disease with heart failure and stage 1 through stage 4 chronic kidney disease, or unspecified chronic kidney disease: Secondary | ICD-10-CM | POA: Diagnosis not present

## 2021-01-14 DIAGNOSIS — I5023 Acute on chronic systolic (congestive) heart failure: Secondary | ICD-10-CM | POA: Diagnosis not present

## 2021-01-14 DIAGNOSIS — D631 Anemia in chronic kidney disease: Secondary | ICD-10-CM | POA: Diagnosis not present

## 2021-01-14 LAB — COMPREHENSIVE METABOLIC PANEL
ALT: 9 IU/L (ref 0–32)
AST: 18 IU/L (ref 0–40)
Albumin/Globulin Ratio: 1.5 (ref 1.2–2.2)
Albumin: 3.7 g/dL (ref 3.6–4.6)
Alkaline Phosphatase: 39 IU/L — ABNORMAL LOW (ref 44–121)
BUN/Creatinine Ratio: 31 — ABNORMAL HIGH (ref 12–28)
BUN: 60 mg/dL — ABNORMAL HIGH (ref 8–27)
Bilirubin Total: 0.3 mg/dL (ref 0.0–1.2)
CO2: 24 mmol/L (ref 20–29)
Calcium: 11.2 mg/dL — ABNORMAL HIGH (ref 8.7–10.3)
Chloride: 98 mmol/L (ref 96–106)
Creatinine, Ser: 1.94 mg/dL — ABNORMAL HIGH (ref 0.57–1.00)
Globulin, Total: 2.4 g/dL (ref 1.5–4.5)
Glucose: 84 mg/dL (ref 65–99)
Potassium: 5.1 mmol/L (ref 3.5–5.2)
Sodium: 138 mmol/L (ref 134–144)
Total Protein: 6.1 g/dL (ref 6.0–8.5)
eGFR: 25 mL/min/{1.73_m2} — ABNORMAL LOW (ref >59)

## 2021-01-14 LAB — CBC WITH DIFFERENTIAL/PLATELET
Basophils Absolute: 0.1 10*3/uL (ref 0.0–0.2)
Basos: 1 %
EOS (ABSOLUTE): 0.3 10*3/uL (ref 0.0–0.4)
Eos: 5 %
Hematocrit: 28.7 % — ABNORMAL LOW (ref 34.0–46.6)
Hemoglobin: 8.4 g/dL — ABNORMAL LOW (ref 11.1–15.9)
Immature Grans (Abs): 0 10*3/uL (ref 0.0–0.1)
Immature Granulocytes: 0 %
Lymphocytes Absolute: 0.9 10*3/uL (ref 0.7–3.1)
Lymphs: 16 %
MCH: 23.7 pg — ABNORMAL LOW (ref 26.6–33.0)
MCHC: 29.3 g/dL — ABNORMAL LOW (ref 31.5–35.7)
MCV: 81 fL (ref 79–97)
Monocytes Absolute: 0.4 10*3/uL (ref 0.1–0.9)
Monocytes: 7 %
Neutrophils Absolute: 4.2 10*3/uL (ref 1.4–7.0)
Neutrophils: 71 %
Platelets: 407 10*3/uL (ref 150–450)
RBC: 3.55 x10E6/uL — ABNORMAL LOW (ref 3.77–5.28)
RDW: 18.4 % — ABNORMAL HIGH (ref 11.7–15.4)
WBC: 6 10*3/uL (ref 3.4–10.8)

## 2021-01-14 NOTE — Progress Notes (Signed)
Hemoglobin 8.4 up from hospital, kidney tests higher, need to watch, calcium high,  recheck lab in one week lp

## 2021-01-17 DIAGNOSIS — E1122 Type 2 diabetes mellitus with diabetic chronic kidney disease: Secondary | ICD-10-CM | POA: Diagnosis not present

## 2021-01-17 DIAGNOSIS — Q288 Other specified congenital malformations of circulatory system: Secondary | ICD-10-CM | POA: Diagnosis not present

## 2021-01-17 DIAGNOSIS — I5023 Acute on chronic systolic (congestive) heart failure: Secondary | ICD-10-CM | POA: Diagnosis not present

## 2021-01-17 DIAGNOSIS — N184 Chronic kidney disease, stage 4 (severe): Secondary | ICD-10-CM | POA: Diagnosis not present

## 2021-01-17 DIAGNOSIS — I130 Hypertensive heart and chronic kidney disease with heart failure and stage 1 through stage 4 chronic kidney disease, or unspecified chronic kidney disease: Secondary | ICD-10-CM | POA: Diagnosis not present

## 2021-01-17 DIAGNOSIS — L89316 Pressure-induced deep tissue damage of right buttock: Secondary | ICD-10-CM | POA: Diagnosis not present

## 2021-01-17 DIAGNOSIS — K256 Chronic or unspecified gastric ulcer with both hemorrhage and perforation: Secondary | ICD-10-CM | POA: Diagnosis not present

## 2021-01-17 DIAGNOSIS — D631 Anemia in chronic kidney disease: Secondary | ICD-10-CM | POA: Diagnosis not present

## 2021-01-17 DIAGNOSIS — D509 Iron deficiency anemia, unspecified: Secondary | ICD-10-CM | POA: Diagnosis not present

## 2021-01-18 ENCOUNTER — Other Ambulatory Visit: Payer: Self-pay

## 2021-01-18 ENCOUNTER — Telehealth: Payer: Self-pay

## 2021-01-18 DIAGNOSIS — I5023 Acute on chronic systolic (congestive) heart failure: Secondary | ICD-10-CM | POA: Diagnosis not present

## 2021-01-18 DIAGNOSIS — I110 Hypertensive heart disease with heart failure: Secondary | ICD-10-CM

## 2021-01-18 DIAGNOSIS — D509 Iron deficiency anemia, unspecified: Secondary | ICD-10-CM | POA: Diagnosis not present

## 2021-01-18 DIAGNOSIS — Q288 Other specified congenital malformations of circulatory system: Secondary | ICD-10-CM | POA: Diagnosis not present

## 2021-01-18 DIAGNOSIS — D631 Anemia in chronic kidney disease: Secondary | ICD-10-CM | POA: Diagnosis not present

## 2021-01-18 DIAGNOSIS — N184 Chronic kidney disease, stage 4 (severe): Secondary | ICD-10-CM | POA: Diagnosis not present

## 2021-01-18 DIAGNOSIS — E1122 Type 2 diabetes mellitus with diabetic chronic kidney disease: Secondary | ICD-10-CM | POA: Diagnosis not present

## 2021-01-18 DIAGNOSIS — L89316 Pressure-induced deep tissue damage of right buttock: Secondary | ICD-10-CM | POA: Diagnosis not present

## 2021-01-18 DIAGNOSIS — K256 Chronic or unspecified gastric ulcer with both hemorrhage and perforation: Secondary | ICD-10-CM | POA: Diagnosis not present

## 2021-01-18 DIAGNOSIS — I130 Hypertensive heart and chronic kidney disease with heart failure and stage 1 through stage 4 chronic kidney disease, or unspecified chronic kidney disease: Secondary | ICD-10-CM | POA: Diagnosis not present

## 2021-01-18 NOTE — Progress Notes (Signed)
Remote pacemaker transmission.   

## 2021-01-18 NOTE — Addendum Note (Signed)
Addended by: Alonna Minium on: 01/18/2021 03:12 PM   Modules accepted: Orders

## 2021-01-18 NOTE — Telephone Encounter (Signed)
Will need to see lp

## 2021-01-18 NOTE — Telephone Encounter (Signed)
Erline Levine, nurse w/ [facility] wanted to make Dr. Henrene Pastor aware of pt's pain. Nurse states pt is rating L knee 10/10 pain. She has been using patches, hydrocodone, and tylenol. Along w/ nurse gave education on heating and icing knee. Nurse wanting to give update since knee pain is now worse after injection.   Harrell Lark 01/18/21 3:17 PM

## 2021-01-19 DIAGNOSIS — N184 Chronic kidney disease, stage 4 (severe): Secondary | ICD-10-CM | POA: Diagnosis not present

## 2021-01-19 DIAGNOSIS — Q288 Other specified congenital malformations of circulatory system: Secondary | ICD-10-CM | POA: Diagnosis not present

## 2021-01-19 DIAGNOSIS — L89316 Pressure-induced deep tissue damage of right buttock: Secondary | ICD-10-CM | POA: Diagnosis not present

## 2021-01-19 DIAGNOSIS — D631 Anemia in chronic kidney disease: Secondary | ICD-10-CM | POA: Diagnosis not present

## 2021-01-19 DIAGNOSIS — I130 Hypertensive heart and chronic kidney disease with heart failure and stage 1 through stage 4 chronic kidney disease, or unspecified chronic kidney disease: Secondary | ICD-10-CM | POA: Diagnosis not present

## 2021-01-19 DIAGNOSIS — D509 Iron deficiency anemia, unspecified: Secondary | ICD-10-CM | POA: Diagnosis not present

## 2021-01-19 DIAGNOSIS — E1122 Type 2 diabetes mellitus with diabetic chronic kidney disease: Secondary | ICD-10-CM | POA: Diagnosis not present

## 2021-01-19 DIAGNOSIS — I5023 Acute on chronic systolic (congestive) heart failure: Secondary | ICD-10-CM | POA: Diagnosis not present

## 2021-01-19 DIAGNOSIS — K256 Chronic or unspecified gastric ulcer with both hemorrhage and perforation: Secondary | ICD-10-CM | POA: Diagnosis not present

## 2021-01-19 NOTE — Telephone Encounter (Signed)
Patient stated that knee pain is getting better. I recommended to give some time to the medicine to work, however if the pain is getting worse, she should give [name] a call back or come to the office to be see.

## 2021-01-20 ENCOUNTER — Other Ambulatory Visit: Payer: Self-pay | Admitting: Oncology

## 2021-01-20 DIAGNOSIS — N184 Chronic kidney disease, stage 4 (severe): Secondary | ICD-10-CM | POA: Diagnosis not present

## 2021-01-20 DIAGNOSIS — I130 Hypertensive heart and chronic kidney disease with heart failure and stage 1 through stage 4 chronic kidney disease, or unspecified chronic kidney disease: Secondary | ICD-10-CM | POA: Diagnosis not present

## 2021-01-20 DIAGNOSIS — D509 Iron deficiency anemia, unspecified: Secondary | ICD-10-CM | POA: Diagnosis not present

## 2021-01-20 DIAGNOSIS — Q288 Other specified congenital malformations of circulatory system: Secondary | ICD-10-CM | POA: Diagnosis not present

## 2021-01-20 DIAGNOSIS — E1122 Type 2 diabetes mellitus with diabetic chronic kidney disease: Secondary | ICD-10-CM | POA: Diagnosis not present

## 2021-01-20 DIAGNOSIS — D631 Anemia in chronic kidney disease: Secondary | ICD-10-CM | POA: Diagnosis not present

## 2021-01-20 DIAGNOSIS — K256 Chronic or unspecified gastric ulcer with both hemorrhage and perforation: Secondary | ICD-10-CM | POA: Diagnosis not present

## 2021-01-20 DIAGNOSIS — I5023 Acute on chronic systolic (congestive) heart failure: Secondary | ICD-10-CM | POA: Diagnosis not present

## 2021-01-20 DIAGNOSIS — L89316 Pressure-induced deep tissue damage of right buttock: Secondary | ICD-10-CM | POA: Diagnosis not present

## 2021-01-20 NOTE — Progress Notes (Signed)
[name]  [address] [place],  [place]  [address] [phone]  Clinic Day:  01/21/2021  Referring physician: [name], MD   HISTORY OF PRESENT ILLNESS:  The patient is a 87 y.o. female with anemia secondary to both iron deficiency and renal insufficiency.  IV Feraheme was given in January 2022 to replenish her iron stores and improve her hemoglobin.  She comes in today to reassess these levels.  Since her last visit, the patient was hospitalized in March 2022 for a CHF exacerbation.  As it pertains to her anemia, she still has baseline fatigue but denies having any overt forms of blood loss.    PHYSICAL EXAM:  Blood pressure (!) 165/70, pulse 76, temperature 97.8 F (36.6 C), resp. rate 16, height 5\' 8"  (1.727 m), weight 150 lb 3.2 oz (68.1 kg), SpO2 98 %. Wt Readings from Last 3 Encounters:  02/01/21 149 lb (67.6 kg)  01/25/21 149 lb (67.6 kg)  01/21/21 150 lb 3.2 oz (68.1 kg)   Body mass index is 22.84 kg/m. Performance status (ECOG): 2 - Symptomatic, <50% confined to bed Physical Exam Constitutional:      Appearance: Normal appearance. She is not ill-appearing.  HENT:     Mouth/Throat:     Mouth: Mucous membranes are moist.     Pharynx: Oropharynx is clear. No oropharyngeal exudate or posterior oropharyngeal erythema.  Cardiovascular:     Rate and Rhythm: Normal rate and regular rhythm.     Heart sounds: No murmur heard. No friction rub. No gallop.   Pulmonary:     Effort: Pulmonary effort is normal. No respiratory distress.     Breath sounds: Normal breath sounds. No wheezing, rhonchi or rales.  Chest:  Breasts:     Right: No axillary adenopathy or supraclavicular adenopathy.     Left: No axillary adenopathy or supraclavicular adenopathy.    Abdominal:     General: Bowel sounds are normal. There is no distension.     Palpations: Abdomen is soft. There is no mass.     Tenderness: There is no abdominal tenderness.   Musculoskeletal:        General: No swelling.     Right lower leg: No edema.     Left lower leg: No edema.  Lymphadenopathy:     Cervical: No cervical adenopathy.     Upper Body:     Right upper body: No supraclavicular or axillary adenopathy.     Left upper body: No supraclavicular or axillary adenopathy.     Lower Body: No right inguinal adenopathy. No left inguinal adenopathy.  Skin:    General: Skin is warm.     Coloration: Skin is not jaundiced.     Findings: No lesion or rash.  Neurological:     General: No focal deficit present.     Mental Status: She is alert and oriented to person, place, and time. Mental status is at baseline.     Cranial Nerves: Cranial nerves are intact.  Psychiatric:        Mood and Affect: Mood normal.        Behavior: Behavior normal.        Thought Content: Thought content normal.    LABS:       Ref. Range 01/21/2021 14:23  Iron Latest Ref Range: 28 - 170 ug/dL 30  UIBC Latest Units: ug/dL 627  TIBC Latest Ref Range: 250 - 450 ug/dL 657 (H)  Saturation Ratios Latest Ref Range: 10.4 -  31.8 % 5 (L)  Ferritin Latest Ref Range: 11 - 307 ng/mL 13   ASSESSMENT & PLAN:  Assessment/Plan:  A 87 y.o. female with anemia secondary to iron deficiency and kidney disease.  Her labs today show that she did has significant iron deficiency anemia.  Based upon this, I will arrange for her to receive another course of IV Feraheme to bolster her iron stores and improve her hemoglobin.  Of note, her kidney parameters are slightly worse than previously.  They will continue to be followed over time.  I will see her back in 3 months to reassess her anemia to see how well she responded to her upcoming IV Feraheme.  The patient understands all the plans discussed today and is in agreement with them.    Adnan Vanvoorhis Macarthur Critchley, MD

## 2021-01-21 ENCOUNTER — Other Ambulatory Visit: Payer: Self-pay | Admitting: Hematology and Oncology

## 2021-01-21 ENCOUNTER — Other Ambulatory Visit: Payer: Self-pay

## 2021-01-21 ENCOUNTER — Other Ambulatory Visit: Payer: Self-pay | Admitting: Legal Medicine

## 2021-01-21 ENCOUNTER — Inpatient Hospital Stay: Payer: Medicare HMO | Attending: Oncology | Admitting: Oncology

## 2021-01-21 ENCOUNTER — Telehealth: Payer: Self-pay | Admitting: Oncology

## 2021-01-21 ENCOUNTER — Other Ambulatory Visit: Payer: Self-pay | Admitting: Oncology

## 2021-01-21 ENCOUNTER — Inpatient Hospital Stay: Payer: Medicare HMO

## 2021-01-21 VITALS — BP 165/70 | HR 76 | Temp 97.8°F | Resp 16 | Ht 68.0 in | Wt 150.2 lb

## 2021-01-21 DIAGNOSIS — D509 Iron deficiency anemia, unspecified: Secondary | ICD-10-CM

## 2021-01-21 DIAGNOSIS — D508 Other iron deficiency anemias: Secondary | ICD-10-CM

## 2021-01-21 DIAGNOSIS — D649 Anemia, unspecified: Secondary | ICD-10-CM | POA: Diagnosis not present

## 2021-01-21 DIAGNOSIS — N189 Chronic kidney disease, unspecified: Secondary | ICD-10-CM | POA: Insufficient documentation

## 2021-01-21 DIAGNOSIS — D631 Anemia in chronic kidney disease: Secondary | ICD-10-CM

## 2021-01-21 DIAGNOSIS — R5383 Other fatigue: Secondary | ICD-10-CM | POA: Diagnosis not present

## 2021-01-21 DIAGNOSIS — Z79899 Other long term (current) drug therapy: Secondary | ICD-10-CM | POA: Diagnosis not present

## 2021-01-21 LAB — BASIC METABOLIC PANEL
BUN: 82 — AB (ref 4–21)
CO2: 29 — AB (ref 13–22)
Chloride: 95 — AB (ref 99–108)
Creatinine: 2.2 — AB (ref 0.5–1.1)
Glucose: 143
Potassium: 4.8 (ref 3.4–5.3)
Sodium: 132 — AB (ref 137–147)

## 2021-01-21 LAB — CBC AND DIFFERENTIAL
HCT: 27 — AB (ref 36–46)
Hemoglobin: 8.3 — AB (ref 12.0–16.0)
Neutrophils Absolute: 3.33
Platelets: 422 — AB (ref 150–399)
WBC: 4.5

## 2021-01-21 LAB — HEPATIC FUNCTION PANEL
ALT: 12 (ref 7–35)
AST: 24 (ref 13–35)
Alkaline Phosphatase: 44 (ref 25–125)
Bilirubin, Total: 0.4

## 2021-01-21 LAB — IRON AND TIBC
Iron: 30 ug/dL (ref 28–170)
Saturation Ratios: 5 % — ABNORMAL LOW (ref 10.4–31.8)
TIBC: 657 ug/dL — ABNORMAL HIGH (ref 250–450)
UIBC: 627 ug/dL

## 2021-01-21 LAB — CBC: RBC: 3.5 — AB (ref 3.87–5.11)

## 2021-01-21 LAB — COMPREHENSIVE METABOLIC PANEL
Albumin: 4 (ref 3.5–5.0)
Calcium: 10.4 (ref 8.7–10.7)

## 2021-01-21 LAB — FERRITIN: Ferritin: 13 ng/mL (ref 11–307)

## 2021-01-21 NOTE — Telephone Encounter (Signed)
01/21/21 Spoke with patient and confirmed all appts for Iron

## 2021-01-21 NOTE — Telephone Encounter (Signed)
Per 4/15 los next appt scheduled and given to patient 

## 2021-01-22 ENCOUNTER — Other Ambulatory Visit: Payer: Self-pay | Admitting: Family Medicine

## 2021-01-24 ENCOUNTER — Telehealth: Payer: Self-pay

## 2021-01-24 ENCOUNTER — Other Ambulatory Visit: Payer: Medicare HMO

## 2021-01-24 DIAGNOSIS — D509 Iron deficiency anemia, unspecified: Secondary | ICD-10-CM | POA: Diagnosis not present

## 2021-01-24 DIAGNOSIS — Q288 Other specified congenital malformations of circulatory system: Secondary | ICD-10-CM | POA: Diagnosis not present

## 2021-01-24 DIAGNOSIS — D631 Anemia in chronic kidney disease: Secondary | ICD-10-CM | POA: Diagnosis not present

## 2021-01-24 DIAGNOSIS — I5023 Acute on chronic systolic (congestive) heart failure: Secondary | ICD-10-CM | POA: Diagnosis not present

## 2021-01-24 DIAGNOSIS — K256 Chronic or unspecified gastric ulcer with both hemorrhage and perforation: Secondary | ICD-10-CM | POA: Diagnosis not present

## 2021-01-24 DIAGNOSIS — L89316 Pressure-induced deep tissue damage of right buttock: Secondary | ICD-10-CM | POA: Diagnosis not present

## 2021-01-24 DIAGNOSIS — E1122 Type 2 diabetes mellitus with diabetic chronic kidney disease: Secondary | ICD-10-CM | POA: Diagnosis not present

## 2021-01-24 DIAGNOSIS — I130 Hypertensive heart and chronic kidney disease with heart failure and stage 1 through stage 4 chronic kidney disease, or unspecified chronic kidney disease: Secondary | ICD-10-CM | POA: Diagnosis not present

## 2021-01-24 DIAGNOSIS — N184 Chronic kidney disease, stage 4 (severe): Secondary | ICD-10-CM | POA: Diagnosis not present

## 2021-01-24 NOTE — Addendum Note (Signed)
Addended by: Juanetta Beets on: 01/24/2021 01:36 PM   Modules accepted: Orders

## 2021-01-24 NOTE — Telephone Encounter (Signed)
[name] called to cancel her appointment for follow-up labs.  She was seen and evaluated by Dr. Bobby Rumpf on Friday and she is scheduled for iron infusion later this week.

## 2021-01-25 ENCOUNTER — Other Ambulatory Visit: Payer: Self-pay

## 2021-01-25 ENCOUNTER — Inpatient Hospital Stay: Payer: Medicare HMO

## 2021-01-25 VITALS — BP 149/57 | HR 75 | Temp 98.1°F | Resp 18 | Ht 68.0 in | Wt 149.0 lb

## 2021-01-25 DIAGNOSIS — N184 Chronic kidney disease, stage 4 (severe): Secondary | ICD-10-CM | POA: Diagnosis not present

## 2021-01-25 DIAGNOSIS — D509 Iron deficiency anemia, unspecified: Secondary | ICD-10-CM | POA: Diagnosis not present

## 2021-01-25 DIAGNOSIS — L89316 Pressure-induced deep tissue damage of right buttock: Secondary | ICD-10-CM | POA: Diagnosis not present

## 2021-01-25 DIAGNOSIS — D649 Anemia, unspecified: Secondary | ICD-10-CM

## 2021-01-25 DIAGNOSIS — D631 Anemia in chronic kidney disease: Secondary | ICD-10-CM | POA: Diagnosis not present

## 2021-01-25 DIAGNOSIS — N189 Chronic kidney disease, unspecified: Secondary | ICD-10-CM | POA: Diagnosis not present

## 2021-01-25 DIAGNOSIS — Z79899 Other long term (current) drug therapy: Secondary | ICD-10-CM | POA: Diagnosis not present

## 2021-01-25 DIAGNOSIS — I5023 Acute on chronic systolic (congestive) heart failure: Secondary | ICD-10-CM | POA: Diagnosis not present

## 2021-01-25 DIAGNOSIS — E1122 Type 2 diabetes mellitus with diabetic chronic kidney disease: Secondary | ICD-10-CM | POA: Diagnosis not present

## 2021-01-25 DIAGNOSIS — K256 Chronic or unspecified gastric ulcer with both hemorrhage and perforation: Secondary | ICD-10-CM | POA: Diagnosis not present

## 2021-01-25 DIAGNOSIS — Q288 Other specified congenital malformations of circulatory system: Secondary | ICD-10-CM | POA: Diagnosis not present

## 2021-01-25 DIAGNOSIS — I130 Hypertensive heart and chronic kidney disease with heart failure and stage 1 through stage 4 chronic kidney disease, or unspecified chronic kidney disease: Secondary | ICD-10-CM | POA: Diagnosis not present

## 2021-01-25 DIAGNOSIS — R5383 Other fatigue: Secondary | ICD-10-CM | POA: Diagnosis not present

## 2021-01-25 MED ORDER — SODIUM CHLORIDE 0.9 % IV SOLN
Freq: Once | INTRAVENOUS | Status: AC
  Filled 2021-01-25: qty 250

## 2021-01-25 MED ORDER — SODIUM CHLORIDE 0.9 % IV SOLN
510.0000 mg | Freq: Once | INTRAVENOUS | Status: AC
  Administered 2021-01-25: 510 mg via INTRAVENOUS
  Filled 2021-01-25: qty 510

## 2021-01-25 NOTE — Patient Instructions (Signed)
Ferumoxytol injection What is this medicine? FERUMOXYTOL is an iron complex. Iron is used to make healthy red blood cells, which carry oxygen and nutrients throughout the body. This medicine is used to treat iron deficiency anemia. This medicine may be used for other purposes; ask your health care provider or pharmacist if you have questions. COMMON BRAND NAME(S): Feraheme What should I tell my health care provider before I take this medicine? They need to know if you have any of these conditions:  anemia not caused by low iron levels  high levels of iron in the blood  magnetic resonance imaging (MRI) test scheduled  an unusual or allergic reaction to iron, other medicines, foods, dyes, or preservatives  pregnant or trying to get pregnant  breast-feeding How should I use this medicine? This medicine is for injection into a vein. It is given by a health care professional in a hospital or clinic setting. Talk to your pediatrician regarding the use of this medicine in children. Special care may be needed. Overdosage: If you think you have taken too much of this medicine contact a poison control center or emergency room at once. NOTE: This medicine is only for you. Do not share this medicine with others. What if I miss a dose? It is important not to miss your dose. Call your doctor or health care professional if you are unable to keep an appointment. What may interact with this medicine? This medicine may interact with the following medications:  other iron products This list may not describe all possible interactions. Give your health care provider a list of all the medicines, herbs, non-prescription drugs, or dietary supplements you use. Also tell them if you smoke, drink alcohol, or use illegal drugs. Some items may interact with your medicine. What should I watch for while using this medicine? Visit your doctor or healthcare professional regularly. Tell your doctor or healthcare  professional if your symptoms do not start to get better or if they get worse. You may need blood work done while you are taking this medicine. You may need to follow a special diet. Talk to your doctor. Foods that contain iron include: whole grains/cereals, dried fruits, beans, or peas, leafy green vegetables, and organ meats (liver, kidney). What side effects may I notice from receiving this medicine? Side effects that you should report to your doctor or health care professional as soon as possible:  allergic reactions like skin rash, itching or hives, swelling of the face, lips, or tongue  breathing problems  changes in blood pressure  feeling faint or lightheaded, falls  fever or chills  flushing, sweating, or hot feelings  swelling of the ankles or feet Side effects that usually do not require medical attention (report to your doctor or health care professional if they continue or are bothersome):  diarrhea  headache  nausea, vomiting  stomach pain This list may not describe all possible side effects. Call your doctor for medical advice about side effects. You may report side effects to FDA at 1-800-FDA-1088. Where should I keep my medicine? This drug is given in a hospital or clinic and will not be stored at home. NOTE: This sheet is a summary. It may not cover all possible information. If you have questions about this medicine, talk to your doctor, pharmacist, or health care provider.  2021 Elsevier/Gold Standard (2016-11-13 20:21:10)  

## 2021-01-25 NOTE — Progress Notes (Signed)
Pt d/c stable at 1445 

## 2021-01-27 DIAGNOSIS — Q288 Other specified congenital malformations of circulatory system: Secondary | ICD-10-CM | POA: Diagnosis not present

## 2021-01-27 DIAGNOSIS — I130 Hypertensive heart and chronic kidney disease with heart failure and stage 1 through stage 4 chronic kidney disease, or unspecified chronic kidney disease: Secondary | ICD-10-CM | POA: Diagnosis not present

## 2021-01-27 DIAGNOSIS — K256 Chronic or unspecified gastric ulcer with both hemorrhage and perforation: Secondary | ICD-10-CM | POA: Diagnosis not present

## 2021-01-27 DIAGNOSIS — D509 Iron deficiency anemia, unspecified: Secondary | ICD-10-CM | POA: Diagnosis not present

## 2021-01-27 DIAGNOSIS — L89316 Pressure-induced deep tissue damage of right buttock: Secondary | ICD-10-CM | POA: Diagnosis not present

## 2021-01-27 DIAGNOSIS — N184 Chronic kidney disease, stage 4 (severe): Secondary | ICD-10-CM | POA: Diagnosis not present

## 2021-01-27 DIAGNOSIS — E1122 Type 2 diabetes mellitus with diabetic chronic kidney disease: Secondary | ICD-10-CM | POA: Diagnosis not present

## 2021-01-27 DIAGNOSIS — I5023 Acute on chronic systolic (congestive) heart failure: Secondary | ICD-10-CM | POA: Diagnosis not present

## 2021-01-27 DIAGNOSIS — D631 Anemia in chronic kidney disease: Secondary | ICD-10-CM | POA: Diagnosis not present

## 2021-01-31 DIAGNOSIS — I5023 Acute on chronic systolic (congestive) heart failure: Secondary | ICD-10-CM | POA: Diagnosis not present

## 2021-01-31 DIAGNOSIS — N184 Chronic kidney disease, stage 4 (severe): Secondary | ICD-10-CM | POA: Diagnosis not present

## 2021-01-31 DIAGNOSIS — Q288 Other specified congenital malformations of circulatory system: Secondary | ICD-10-CM | POA: Diagnosis not present

## 2021-01-31 DIAGNOSIS — D509 Iron deficiency anemia, unspecified: Secondary | ICD-10-CM | POA: Diagnosis not present

## 2021-01-31 DIAGNOSIS — K256 Chronic or unspecified gastric ulcer with both hemorrhage and perforation: Secondary | ICD-10-CM | POA: Diagnosis not present

## 2021-01-31 DIAGNOSIS — L89316 Pressure-induced deep tissue damage of right buttock: Secondary | ICD-10-CM | POA: Diagnosis not present

## 2021-01-31 DIAGNOSIS — I130 Hypertensive heart and chronic kidney disease with heart failure and stage 1 through stage 4 chronic kidney disease, or unspecified chronic kidney disease: Secondary | ICD-10-CM | POA: Diagnosis not present

## 2021-01-31 DIAGNOSIS — D631 Anemia in chronic kidney disease: Secondary | ICD-10-CM | POA: Diagnosis not present

## 2021-01-31 DIAGNOSIS — E1122 Type 2 diabetes mellitus with diabetic chronic kidney disease: Secondary | ICD-10-CM | POA: Diagnosis not present

## 2021-02-01 ENCOUNTER — Other Ambulatory Visit: Payer: Self-pay

## 2021-02-01 ENCOUNTER — Inpatient Hospital Stay: Payer: Medicare HMO

## 2021-02-01 VITALS — BP 139/54 | HR 70 | Temp 98.0°F | Resp 18 | Ht 68.0 in | Wt 149.0 lb

## 2021-02-01 DIAGNOSIS — D509 Iron deficiency anemia, unspecified: Secondary | ICD-10-CM | POA: Diagnosis not present

## 2021-02-01 DIAGNOSIS — R5383 Other fatigue: Secondary | ICD-10-CM | POA: Diagnosis not present

## 2021-02-01 DIAGNOSIS — D649 Anemia, unspecified: Secondary | ICD-10-CM

## 2021-02-01 DIAGNOSIS — Z79899 Other long term (current) drug therapy: Secondary | ICD-10-CM | POA: Diagnosis not present

## 2021-02-01 DIAGNOSIS — D631 Anemia in chronic kidney disease: Secondary | ICD-10-CM | POA: Diagnosis not present

## 2021-02-01 DIAGNOSIS — N189 Chronic kidney disease, unspecified: Secondary | ICD-10-CM | POA: Diagnosis not present

## 2021-02-01 MED ORDER — FERUMOXYTOL INJECTION 510 MG/17 ML
510.0000 mg | Freq: Once | INTRAVENOUS | Status: AC
  Administered 2021-02-01: 510 mg via INTRAVENOUS
  Filled 2021-02-01: qty 17

## 2021-02-01 MED ORDER — SODIUM CHLORIDE 0.9 % IV SOLN
Freq: Once | INTRAVENOUS | Status: AC
  Filled 2021-02-01: qty 250

## 2021-02-01 NOTE — Patient Instructions (Signed)
Ferumoxytol injection What is this medicine? FERUMOXYTOL is an iron complex. Iron is used to make healthy red blood cells, which carry oxygen and nutrients throughout the body. This medicine is used to treat iron deficiency anemia. This medicine may be used for other purposes; ask your health care provider or pharmacist if you have questions. COMMON BRAND NAME(S): Feraheme What should I tell my health care provider before I take this medicine? They need to know if you have any of these conditions:  anemia not caused by low iron levels  high levels of iron in the blood  magnetic resonance imaging (MRI) test scheduled  an unusual or allergic reaction to iron, other medicines, foods, dyes, or preservatives  pregnant or trying to get pregnant  breast-feeding How should I use this medicine? This medicine is for injection into a vein. It is given by a health care professional in a hospital or clinic setting. Talk to your pediatrician regarding the use of this medicine in children. Special care may be needed. Overdosage: If you think you have taken too much of this medicine contact a poison control center or emergency room at once. NOTE: This medicine is only for you. Do not share this medicine with others. What if I miss a dose? It is important not to miss your dose. Call your doctor or health care professional if you are unable to keep an appointment. What may interact with this medicine? This medicine may interact with the following medications:  other iron products This list may not describe all possible interactions. Give your health care provider a list of all the medicines, herbs, non-prescription drugs, or dietary supplements you use. Also tell them if you smoke, drink alcohol, or use illegal drugs. Some items may interact with your medicine. What should I watch for while using this medicine? Visit your doctor or healthcare professional regularly. Tell your doctor or healthcare  professional if your symptoms do not start to get better or if they get worse. You may need blood work done while you are taking this medicine. You may need to follow a special diet. Talk to your doctor. Foods that contain iron include: whole grains/cereals, dried fruits, beans, or peas, leafy green vegetables, and organ meats (liver, kidney). What side effects may I notice from receiving this medicine? Side effects that you should report to your doctor or health care professional as soon as possible:  allergic reactions like skin rash, itching or hives, swelling of the face, lips, or tongue  breathing problems  changes in blood pressure  feeling faint or lightheaded, falls  fever or chills  flushing, sweating, or hot feelings  swelling of the ankles or feet Side effects that usually do not require medical attention (report to your doctor or health care professional if they continue or are bothersome):  diarrhea  headache  nausea, vomiting  stomach pain This list may not describe all possible side effects. Call your doctor for medical advice about side effects. You may report side effects to FDA at 1-800-FDA-1088. Where should I keep my medicine? This drug is given in a hospital or clinic and will not be stored at home. NOTE: This sheet is a summary. It may not cover all possible information. If you have questions about this medicine, talk to your doctor, pharmacist, or health care provider.  2021 Elsevier/Gold Standard (2016-11-13 20:21:10)  

## 2021-02-02 ENCOUNTER — Other Ambulatory Visit: Payer: Self-pay | Admitting: Family Medicine

## 2021-02-02 DIAGNOSIS — D631 Anemia in chronic kidney disease: Secondary | ICD-10-CM | POA: Diagnosis not present

## 2021-02-02 DIAGNOSIS — D509 Iron deficiency anemia, unspecified: Secondary | ICD-10-CM | POA: Diagnosis not present

## 2021-02-02 DIAGNOSIS — L89316 Pressure-induced deep tissue damage of right buttock: Secondary | ICD-10-CM | POA: Diagnosis not present

## 2021-02-02 DIAGNOSIS — N184 Chronic kidney disease, stage 4 (severe): Secondary | ICD-10-CM | POA: Diagnosis not present

## 2021-02-02 DIAGNOSIS — E1122 Type 2 diabetes mellitus with diabetic chronic kidney disease: Secondary | ICD-10-CM | POA: Diagnosis not present

## 2021-02-02 DIAGNOSIS — I5023 Acute on chronic systolic (congestive) heart failure: Secondary | ICD-10-CM | POA: Diagnosis not present

## 2021-02-02 DIAGNOSIS — K256 Chronic or unspecified gastric ulcer with both hemorrhage and perforation: Secondary | ICD-10-CM | POA: Diagnosis not present

## 2021-02-02 DIAGNOSIS — I130 Hypertensive heart and chronic kidney disease with heart failure and stage 1 through stage 4 chronic kidney disease, or unspecified chronic kidney disease: Secondary | ICD-10-CM | POA: Diagnosis not present

## 2021-02-02 DIAGNOSIS — Q288 Other specified congenital malformations of circulatory system: Secondary | ICD-10-CM | POA: Diagnosis not present

## 2021-02-02 DIAGNOSIS — I5022 Chronic systolic (congestive) heart failure: Secondary | ICD-10-CM

## 2021-02-04 ENCOUNTER — Other Ambulatory Visit: Payer: Self-pay

## 2021-02-04 MED ORDER — OXYCODONE-ACETAMINOPHEN 7.5-325 MG PO TABS: 1.0000 | ORAL_TABLET | Freq: Four times a day (QID) | ORAL | 0 refills | Status: DC | PRN

## 2021-02-05 DIAGNOSIS — D509 Iron deficiency anemia, unspecified: Secondary | ICD-10-CM | POA: Diagnosis not present

## 2021-02-05 DIAGNOSIS — I130 Hypertensive heart and chronic kidney disease with heart failure and stage 1 through stage 4 chronic kidney disease, or unspecified chronic kidney disease: Secondary | ICD-10-CM | POA: Diagnosis not present

## 2021-02-05 DIAGNOSIS — D631 Anemia in chronic kidney disease: Secondary | ICD-10-CM | POA: Diagnosis not present

## 2021-02-05 DIAGNOSIS — L89316 Pressure-induced deep tissue damage of right buttock: Secondary | ICD-10-CM | POA: Diagnosis not present

## 2021-02-05 DIAGNOSIS — K256 Chronic or unspecified gastric ulcer with both hemorrhage and perforation: Secondary | ICD-10-CM | POA: Diagnosis not present

## 2021-02-05 DIAGNOSIS — E1122 Type 2 diabetes mellitus with diabetic chronic kidney disease: Secondary | ICD-10-CM | POA: Diagnosis not present

## 2021-02-05 DIAGNOSIS — Q288 Other specified congenital malformations of circulatory system: Secondary | ICD-10-CM | POA: Diagnosis not present

## 2021-02-05 DIAGNOSIS — I5023 Acute on chronic systolic (congestive) heart failure: Secondary | ICD-10-CM | POA: Diagnosis not present

## 2021-02-05 DIAGNOSIS — N184 Chronic kidney disease, stage 4 (severe): Secondary | ICD-10-CM | POA: Diagnosis not present

## 2021-02-07 ENCOUNTER — Telehealth: Payer: Self-pay

## 2021-02-07 DIAGNOSIS — N184 Chronic kidney disease, stage 4 (severe): Secondary | ICD-10-CM | POA: Diagnosis not present

## 2021-02-07 DIAGNOSIS — I130 Hypertensive heart and chronic kidney disease with heart failure and stage 1 through stage 4 chronic kidney disease, or unspecified chronic kidney disease: Secondary | ICD-10-CM | POA: Diagnosis not present

## 2021-02-07 DIAGNOSIS — E1122 Type 2 diabetes mellitus with diabetic chronic kidney disease: Secondary | ICD-10-CM | POA: Diagnosis not present

## 2021-02-07 DIAGNOSIS — I5023 Acute on chronic systolic (congestive) heart failure: Secondary | ICD-10-CM | POA: Diagnosis not present

## 2021-02-07 DIAGNOSIS — Q288 Other specified congenital malformations of circulatory system: Secondary | ICD-10-CM | POA: Diagnosis not present

## 2021-02-07 DIAGNOSIS — L89316 Pressure-induced deep tissue damage of right buttock: Secondary | ICD-10-CM | POA: Diagnosis not present

## 2021-02-07 DIAGNOSIS — D509 Iron deficiency anemia, unspecified: Secondary | ICD-10-CM | POA: Diagnosis not present

## 2021-02-07 DIAGNOSIS — K256 Chronic or unspecified gastric ulcer with both hemorrhage and perforation: Secondary | ICD-10-CM | POA: Diagnosis not present

## 2021-02-07 DIAGNOSIS — D631 Anemia in chronic kidney disease: Secondary | ICD-10-CM | POA: Diagnosis not present

## 2021-02-07 NOTE — Progress Notes (Signed)
Chronic Care Management Pharmacy Assistant   Name: [name]  MRN: [id number] DOB: 09-22-1934  Reason for Encounter: Disease State for diabetes    Recent office visits:  02/01/21-oncology, infusion for anemia  01/25/21-oncology, infusion for anemia  01/21/21-oncology for anemia, labs done  01/13/21-Dr. Henrene Pastor PCP, rehab discharge follow up fir CHF, follow up 4 weeks  Recent consult visits:  none  Hospital visits:  12/10/20-ED for Hypertensive heart disease  Medications: Outpatient Encounter Medications as of 02/07/2021  Medication Sig  . Accu-Chek FastClix Lancets MISC TEST BLOOD SUGAR TWICE DAILY E11.69  . Alcohol Swabs (B-D SINGLE USE SWABS REGULAR) PADS USE TWICE DAILY  E11.69  . allopurinol (ZYLOPRIM) 100 MG tablet   . ALPRAZolam (XANAX) 0.5 MG tablet TAKE ONE TABLET BY MOUTH EVERY NIGHT AT BEDTIME FOR INSOMNIA  . amLODipine (NORVASC) 5 MG tablet TAKE 1 TABLET EVERY DAY  . calcium carbonate (OSCAL) 1500 (600 Ca) MG TABS tablet Take 600 mg of elemental calcium by mouth 2 (two) times daily with a meal.  . digoxin (LANOXIN) 0.125 MG tablet TAKE 1/2 TABLET EVERY DAY (SUBSTITUTED FOR  DIGITEK)  . DULoxetine (CYMBALTA) 20 MG capsule TAKE 1 CAPSULE BY MOUTH EVERYDAY AT BEDTIME  . ezetimibe (ZETIA) 10 MG tablet TAKE 1 TABLET EVERY DAY  . fenofibrate micronized (LOFIBRA) 134 MG capsule TAKE 1 CAPSULE (134 MG TOTAL) BY MOUTH DAILY.  Marland Kitchen glucose blood (ACCU-CHEK SMARTVIEW) test strip CHECK BLOOD SUGAR TWICE DAILY  . levothyroxine (SYNTHROID) 50 MCG tablet Take 1 tablet (50 mcg total) by mouth daily before breakfast.  . losartan (COZAAR) 100 MG tablet TAKE 1 TABLET EVERY DAY  . Multiple Vitamin (MULTIVITAMIN WITH MINERALS) TABS tablet Take 1 tablet by mouth daily. Centrum Silver  . Naphazoline-Pheniramine (OPCON-A) 0.027-0.315 % SOLN Apply 1 drop to eye in the morning and at bedtime.  Marland Kitchen nystatin cream (MYCOSTATIN) Apply 1 application topically 3 (three) times daily. Cream  to back rash  . olopatadine (PATANOL) 0.1 % ophthalmic solution Place 1 drop into both eyes 2 (two) times daily.  Marland Kitchen omega-3 acid ethyl esters (LOVAZA) 1 g capsule TAKE 1 CAPSULE TWICE DAILY  . oxyCODONE-acetaminophen (PERCOCET) 7.5-325 MG tablet Take 1 tablet by mouth every 6 (six) hours as needed (back pain.).  Marland Kitchen polyethylene glycol (MIRALAX / GLYCOLAX) 17 g packet Take 17 g by mouth daily as needed.   . pravastatin (PRAVACHOL) 10 MG tablet TAKE 1 TABLET FOUR TIMES WEEKLY  . rOPINIRole (REQUIP) 1 MG tablet TAKE 1 TABLET AT BEDTIME  . senna (SENOKOT) 8.6 MG TABS tablet Take 2 tablets by mouth daily.  Marland Kitchen spironolactone (ALDACTONE) 25 MG tablet TAKE 1/2 TABLET BY MOUTH EVERY DAY  . tiZANidine (ZANAFLEX) 4 MG tablet TAKE 1 TABLET AT BEDTIME  . XARELTO 15 MG TABS tablet TAKE 1 TABLET DAILY WITH SUPPER.   No facility-administered encounter medications on file as of 02/07/2021.    Recent Relevant Labs: Lab Results  Component Value Date/Time   HGBA1C 5.0 12/03/2020 11:53 AM   HGBA1C 5.7 (H) 08/26/2020 01:28 PM   MICROALBUR 10 12/25/2019 12:45 PM    Kidney Function Lab Results  Component Value Date/Time   CREATININE 2.2 (A) 01/21/2021 12:00 AM   CREATININE 1.94 (H) 01/13/2021 11:46 AM   CREATININE 1.41 (H) 12/03/2020 11:45 AM   GFR 38.32 (L) 11/30/2014 12:13 PM   GFRNONAA 34 (L) 12/03/2020 11:45 AM   GFRAA 39 (L) 12/03/2020 11:45 AM    . Current antihyperglycemic regimen:  No on  medication     . What recent interventions/DTPs have been made to improve glycemic control:  Patient is staying active and watches her diet, she stated her weight is at 140lbs.  She stated her legs are itching with the support hose she wears, she has a latex allergy and wondered what she can do.  I told her I would reach out to [employer], CPP   . Have there been any recent hospitalizations or ED visits since last visit with CPP? No recent hospital visits.   . Patient denies hypoglycemic symptoms, including  None  . Patient denies hyperglycemic symptoms, including none  . How often are you checking your blood sugar? once daily  . What are your blood sugars ranging? Patient stated they are between 89-100  . During the week, how often does your blood glucose drop below 70? Patient stated they have not dropped below 70 in a long time.  She stated she is doing well.   . Are you checking your feet daily/regularly? Yes, patient is checking her feet   Adherence Review: Is the patient currently on a STATIN medication? Yes Is the patient currently on ACE/ARB medication? No Does the patient have >5 day gap between last estimated fill dates? CPP to review  Star Rating Drugs:  Medication:  Last Fill: Day Supply Pravastatin   11/09/20       [place], [place] Pharmacist Assistant [phone]

## 2021-02-09 ENCOUNTER — Other Ambulatory Visit: Payer: Self-pay | Admitting: Family Medicine

## 2021-02-10 DIAGNOSIS — I130 Hypertensive heart and chronic kidney disease with heart failure and stage 1 through stage 4 chronic kidney disease, or unspecified chronic kidney disease: Secondary | ICD-10-CM | POA: Diagnosis not present

## 2021-02-10 DIAGNOSIS — L89316 Pressure-induced deep tissue damage of right buttock: Secondary | ICD-10-CM | POA: Diagnosis not present

## 2021-02-10 DIAGNOSIS — D631 Anemia in chronic kidney disease: Secondary | ICD-10-CM | POA: Diagnosis not present

## 2021-02-10 DIAGNOSIS — D509 Iron deficiency anemia, unspecified: Secondary | ICD-10-CM | POA: Diagnosis not present

## 2021-02-10 DIAGNOSIS — I5023 Acute on chronic systolic (congestive) heart failure: Secondary | ICD-10-CM | POA: Diagnosis not present

## 2021-02-10 DIAGNOSIS — K256 Chronic or unspecified gastric ulcer with both hemorrhage and perforation: Secondary | ICD-10-CM | POA: Diagnosis not present

## 2021-02-10 DIAGNOSIS — E1122 Type 2 diabetes mellitus with diabetic chronic kidney disease: Secondary | ICD-10-CM | POA: Diagnosis not present

## 2021-02-10 DIAGNOSIS — Q288 Other specified congenital malformations of circulatory system: Secondary | ICD-10-CM | POA: Diagnosis not present

## 2021-02-10 DIAGNOSIS — N184 Chronic kidney disease, stage 4 (severe): Secondary | ICD-10-CM | POA: Diagnosis not present

## 2021-02-11 ENCOUNTER — Other Ambulatory Visit: Payer: Self-pay | Admitting: Physician Assistant

## 2021-02-11 ENCOUNTER — Other Ambulatory Visit: Payer: Self-pay | Admitting: Family Medicine

## 2021-02-15 DIAGNOSIS — D631 Anemia in chronic kidney disease: Secondary | ICD-10-CM | POA: Diagnosis not present

## 2021-02-15 DIAGNOSIS — Q288 Other specified congenital malformations of circulatory system: Secondary | ICD-10-CM | POA: Diagnosis not present

## 2021-02-15 DIAGNOSIS — K256 Chronic or unspecified gastric ulcer with both hemorrhage and perforation: Secondary | ICD-10-CM | POA: Diagnosis not present

## 2021-02-15 DIAGNOSIS — L89316 Pressure-induced deep tissue damage of right buttock: Secondary | ICD-10-CM | POA: Diagnosis not present

## 2021-02-15 DIAGNOSIS — D509 Iron deficiency anemia, unspecified: Secondary | ICD-10-CM | POA: Diagnosis not present

## 2021-02-15 DIAGNOSIS — N184 Chronic kidney disease, stage 4 (severe): Secondary | ICD-10-CM | POA: Diagnosis not present

## 2021-02-15 DIAGNOSIS — I5023 Acute on chronic systolic (congestive) heart failure: Secondary | ICD-10-CM | POA: Diagnosis not present

## 2021-02-15 DIAGNOSIS — E1122 Type 2 diabetes mellitus with diabetic chronic kidney disease: Secondary | ICD-10-CM | POA: Diagnosis not present

## 2021-02-15 DIAGNOSIS — I130 Hypertensive heart and chronic kidney disease with heart failure and stage 1 through stage 4 chronic kidney disease, or unspecified chronic kidney disease: Secondary | ICD-10-CM | POA: Diagnosis not present

## 2021-02-16 DIAGNOSIS — K256 Chronic or unspecified gastric ulcer with both hemorrhage and perforation: Secondary | ICD-10-CM | POA: Diagnosis not present

## 2021-02-16 DIAGNOSIS — E1122 Type 2 diabetes mellitus with diabetic chronic kidney disease: Secondary | ICD-10-CM | POA: Diagnosis not present

## 2021-02-16 DIAGNOSIS — Q288 Other specified congenital malformations of circulatory system: Secondary | ICD-10-CM | POA: Diagnosis not present

## 2021-02-16 DIAGNOSIS — D631 Anemia in chronic kidney disease: Secondary | ICD-10-CM | POA: Diagnosis not present

## 2021-02-16 DIAGNOSIS — I5023 Acute on chronic systolic (congestive) heart failure: Secondary | ICD-10-CM | POA: Diagnosis not present

## 2021-02-16 DIAGNOSIS — N184 Chronic kidney disease, stage 4 (severe): Secondary | ICD-10-CM | POA: Diagnosis not present

## 2021-02-16 DIAGNOSIS — I130 Hypertensive heart and chronic kidney disease with heart failure and stage 1 through stage 4 chronic kidney disease, or unspecified chronic kidney disease: Secondary | ICD-10-CM | POA: Diagnosis not present

## 2021-02-16 DIAGNOSIS — L89316 Pressure-induced deep tissue damage of right buttock: Secondary | ICD-10-CM | POA: Diagnosis not present

## 2021-02-16 DIAGNOSIS — D509 Iron deficiency anemia, unspecified: Secondary | ICD-10-CM | POA: Diagnosis not present

## 2021-02-17 DIAGNOSIS — D510 Vitamin B12 deficiency anemia due to intrinsic factor deficiency: Secondary | ICD-10-CM | POA: Diagnosis not present

## 2021-02-17 DIAGNOSIS — R1310 Dysphagia, unspecified: Secondary | ICD-10-CM | POA: Diagnosis not present

## 2021-02-17 DIAGNOSIS — D649 Anemia, unspecified: Secondary | ICD-10-CM | POA: Diagnosis not present

## 2021-02-17 DIAGNOSIS — K2900 Acute gastritis without bleeding: Secondary | ICD-10-CM | POA: Diagnosis not present

## 2021-02-18 DIAGNOSIS — Q288 Other specified congenital malformations of circulatory system: Secondary | ICD-10-CM | POA: Diagnosis not present

## 2021-02-18 DIAGNOSIS — K256 Chronic or unspecified gastric ulcer with both hemorrhage and perforation: Secondary | ICD-10-CM | POA: Diagnosis not present

## 2021-02-18 DIAGNOSIS — D631 Anemia in chronic kidney disease: Secondary | ICD-10-CM | POA: Diagnosis not present

## 2021-02-18 DIAGNOSIS — I5023 Acute on chronic systolic (congestive) heart failure: Secondary | ICD-10-CM | POA: Diagnosis not present

## 2021-02-18 DIAGNOSIS — L89316 Pressure-induced deep tissue damage of right buttock: Secondary | ICD-10-CM | POA: Diagnosis not present

## 2021-02-18 DIAGNOSIS — E1122 Type 2 diabetes mellitus with diabetic chronic kidney disease: Secondary | ICD-10-CM | POA: Diagnosis not present

## 2021-02-18 DIAGNOSIS — D509 Iron deficiency anemia, unspecified: Secondary | ICD-10-CM | POA: Diagnosis not present

## 2021-02-18 DIAGNOSIS — I130 Hypertensive heart and chronic kidney disease with heart failure and stage 1 through stage 4 chronic kidney disease, or unspecified chronic kidney disease: Secondary | ICD-10-CM | POA: Diagnosis not present

## 2021-02-18 DIAGNOSIS — N184 Chronic kidney disease, stage 4 (severe): Secondary | ICD-10-CM | POA: Diagnosis not present

## 2021-02-21 ENCOUNTER — Telehealth: Payer: Self-pay

## 2021-02-21 DIAGNOSIS — K256 Chronic or unspecified gastric ulcer with both hemorrhage and perforation: Secondary | ICD-10-CM | POA: Diagnosis not present

## 2021-02-21 DIAGNOSIS — E1122 Type 2 diabetes mellitus with diabetic chronic kidney disease: Secondary | ICD-10-CM | POA: Diagnosis not present

## 2021-02-21 DIAGNOSIS — I5023 Acute on chronic systolic (congestive) heart failure: Secondary | ICD-10-CM | POA: Diagnosis not present

## 2021-02-21 DIAGNOSIS — Q288 Other specified congenital malformations of circulatory system: Secondary | ICD-10-CM | POA: Diagnosis not present

## 2021-02-21 DIAGNOSIS — D509 Iron deficiency anemia, unspecified: Secondary | ICD-10-CM | POA: Diagnosis not present

## 2021-02-21 DIAGNOSIS — D631 Anemia in chronic kidney disease: Secondary | ICD-10-CM | POA: Diagnosis not present

## 2021-02-21 DIAGNOSIS — L89316 Pressure-induced deep tissue damage of right buttock: Secondary | ICD-10-CM | POA: Diagnosis not present

## 2021-02-21 DIAGNOSIS — N184 Chronic kidney disease, stage 4 (severe): Secondary | ICD-10-CM | POA: Diagnosis not present

## 2021-02-21 DIAGNOSIS — I130 Hypertensive heart and chronic kidney disease with heart failure and stage 1 through stage 4 chronic kidney disease, or unspecified chronic kidney disease: Secondary | ICD-10-CM | POA: Diagnosis not present

## 2021-02-21 NOTE — Telephone Encounter (Signed)
[name] from [facility] called to make Dr. Tobie Poet aware that [name] fell on Saturday.  She is complaining of increased knee pain since her fall.  She is complaining of no other symptoms.  She is scheduled to be seen in the office tomorrow.

## 2021-02-22 ENCOUNTER — Other Ambulatory Visit: Payer: Self-pay

## 2021-02-22 ENCOUNTER — Ambulatory Visit (INDEPENDENT_AMBULATORY_CARE_PROVIDER_SITE_OTHER): Payer: Medicare HMO | Admitting: Family Medicine

## 2021-02-22 VITALS — BP 110/62 | HR 84 | Temp 97.2°F | Resp 18 | Ht 68.0 in | Wt 149.2 lb

## 2021-02-22 DIAGNOSIS — I130 Hypertensive heart and chronic kidney disease with heart failure and stage 1 through stage 4 chronic kidney disease, or unspecified chronic kidney disease: Secondary | ICD-10-CM | POA: Diagnosis not present

## 2021-02-22 DIAGNOSIS — Z95810 Presence of automatic (implantable) cardiac defibrillator: Secondary | ICD-10-CM | POA: Diagnosis not present

## 2021-02-22 DIAGNOSIS — E1140 Type 2 diabetes mellitus with diabetic neuropathy, unspecified: Secondary | ICD-10-CM

## 2021-02-22 DIAGNOSIS — D631 Anemia in chronic kidney disease: Secondary | ICD-10-CM | POA: Diagnosis not present

## 2021-02-22 DIAGNOSIS — E1122 Type 2 diabetes mellitus with diabetic chronic kidney disease: Secondary | ICD-10-CM | POA: Diagnosis not present

## 2021-02-22 DIAGNOSIS — I5023 Acute on chronic systolic (congestive) heart failure: Secondary | ICD-10-CM | POA: Diagnosis not present

## 2021-02-22 DIAGNOSIS — H6121 Impacted cerumen, right ear: Secondary | ICD-10-CM

## 2021-02-22 DIAGNOSIS — D509 Iron deficiency anemia, unspecified: Secondary | ICD-10-CM | POA: Diagnosis not present

## 2021-02-22 DIAGNOSIS — D500 Iron deficiency anemia secondary to blood loss (chronic): Secondary | ICD-10-CM | POA: Diagnosis not present

## 2021-02-22 DIAGNOSIS — I110 Hypertensive heart disease with heart failure: Secondary | ICD-10-CM

## 2021-02-22 DIAGNOSIS — E782 Mixed hyperlipidemia: Secondary | ICD-10-CM

## 2021-02-22 DIAGNOSIS — I4821 Permanent atrial fibrillation: Secondary | ICD-10-CM | POA: Diagnosis not present

## 2021-02-22 DIAGNOSIS — N184 Chronic kidney disease, stage 4 (severe): Secondary | ICD-10-CM | POA: Diagnosis not present

## 2021-02-22 DIAGNOSIS — L89316 Pressure-induced deep tissue damage of right buttock: Secondary | ICD-10-CM | POA: Diagnosis not present

## 2021-02-22 DIAGNOSIS — K256 Chronic or unspecified gastric ulcer with both hemorrhage and perforation: Secondary | ICD-10-CM | POA: Diagnosis not present

## 2021-02-22 DIAGNOSIS — Q288 Other specified congenital malformations of circulatory system: Secondary | ICD-10-CM | POA: Diagnosis not present

## 2021-02-22 NOTE — Patient Instructions (Signed)
Use debrox drops to rt ear as directed.  Return in one week for ear washing and fasting lab work.

## 2021-02-22 NOTE — Progress Notes (Signed)
Subjective:  Patient ID: [name], female    DOB: 1934/07/02  Age: 87 y.o. MRN: [id number]  Chief Complaint  Patient presents with  . Congestive Heart Failure    HPI CHF: On digoxin, amlodipine, losartan. Hyperlipidemia: on pravastatin, zetia, and lovaza.  Hypothyroidism: on synthroid 50 mcg once daily. Hx gastric ulcers. Dr. Lyda Jester restarted pantoprazole.  Diabetes: No longer on levemir.   Chronic atrial fibrillation: on xarelto. Knee pain- fell on Saturday has had increased pain since fall   Current Outpatient Medications on File Prior to Visit  Medication Sig Dispense Refill  . Accu-Chek FastClix Lancets MISC TEST BLOOD SUGAR TWICE DAILY E11.69 204 each 3  . Alcohol Swabs (B-D SINGLE USE SWABS REGULAR) PADS USE TWICE DAILY  E11.69 200 each 3  . allopurinol (ZYLOPRIM) 100 MG tablet     . ALPRAZolam (XANAX) 0.5 MG tablet TAKE ONE TABLET BY MOUTH EVERY NIGHT AT BEDTIME FOR INSOMNIA 30 tablet 2  . amLODipine (NORVASC) 5 MG tablet TAKE 1 TABLET EVERY DAY 90 tablet 1  . calcium carbonate (OSCAL) 1500 (600 Ca) MG TABS tablet Take 600 mg of elemental calcium by mouth 2 (two) times daily with a meal.    . digoxin (LANOXIN) 0.125 MG tablet TAKE 1/2 TABLET EVERY DAY (SUBSTITUTED FOR  DIGITEK) 45 tablet 1  . ezetimibe (ZETIA) 10 MG tablet TAKE 1 TABLET EVERY DAY 90 tablet 1  . fenofibrate micronized (LOFIBRA) 134 MG capsule TAKE 1 CAPSULE (134 MG TOTAL) BY MOUTH DAILY. 90 capsule 1  . fluticasone (FLONASE) 50 MCG/ACT nasal spray     . glucose blood (ACCU-CHEK SMARTVIEW) test strip CHECK BLOOD SUGAR TWICE DAILY 200 strip 3  . levothyroxine (SYNTHROID) 50 MCG tablet Take 1 tablet (50 mcg total) by mouth daily before breakfast. 90 tablet 0  . losartan (COZAAR) 100 MG tablet TAKE 1 TABLET EVERY DAY 90 tablet 1  . Multiple Vitamin (MULTIVITAMIN WITH MINERALS) TABS tablet Take 1 tablet by mouth daily. Centrum Silver    . Naphazoline-Pheniramine (OPCON-A) 0.027-0.315 %  SOLN Apply 1 drop to eye in the morning and at bedtime.    Marland Kitchen nystatin cream (MYCOSTATIN) Apply 1 application topically 3 (three) times daily. Cream to back rash    . olopatadine (PATANOL) 0.1 % ophthalmic solution Place 1 drop into both eyes 2 (two) times daily.    Marland Kitchen omega-3 acid ethyl esters (LOVAZA) 1 g capsule TAKE 1 CAPSULE TWICE DAILY 180 capsule 1  . oxyCODONE-acetaminophen (PERCOCET) 7.5-325 MG tablet Take 1 tablet by mouth every 6 (six) hours as needed (back pain.). 120 tablet 0  . polyethylene glycol (MIRALAX / GLYCOLAX) 17 g packet Take 17 g by mouth daily as needed.     . pravastatin (PRAVACHOL) 10 MG tablet TAKE 1 TABLET FOUR TIMES WEEKLY 48 tablet 1  . rOPINIRole (REQUIP) 1 MG tablet TAKE 1 TABLET AT BEDTIME 90 tablet 2  . senna (SENOKOT) 8.6 MG TABS tablet Take 2 tablets by mouth daily.    Marland Kitchen tiZANidine (ZANAFLEX) 4 MG tablet TAKE 1 TABLET AT BEDTIME 90 tablet 0  . XARELTO 15 MG TABS tablet TAKE 1 TABLET DAILY WITH SUPPER. 90 tablet 3   No current facility-administered medications on file prior to visit.   Past Medical History:  Diagnosis Date  . Amiodarone pulmonary toxicity   . Anemia    no GI bleeding; on iron  . Arthritis   . Atrial fibrillation ([place])   . Biventricular ICD (implantable cardiac defibrillator) in place  BiV ICD for nonischemic cardiomyopathy; BiV responder  . Carotid artery occlusion   . CHF (congestive heart failure) ([place])   . Chronic a-fib ([place])    on xarelto; failed DCCV  . Chronic kidney disease, stage 4 (severe) ([place])   . Diabetes ([place])   . Fibromyalgia   . Full dentures   . GAD (generalized anxiety disorder)   . GERD (gastroesophageal reflux disease)   . H/O cardiovascular stress test 08/04/2010   normal imaging; EF 61%  . H/O echocardiogram 07/05/2010   EF 45-50%; aortic valve-mildly sclerotic; LA-mod dilaterd   . Hyperlipemia   . Hypertension   . Hypertensive heart disease with heart failure ([place])   . Hypothalamic disease ([place])    on  thyroid supplement  . Hypothyroidism   . Hypothyroidism   . LBBB (left bundle branch block)   . Mixed hyperlipidemia   . Occlusion and stenosis of right carotid artery   . Other persistent atrial fibrillation ([place])   . Pneumonia   . Presence of automatic (implantable) cardiac defibrillator   . PVD (peripheral vascular disease) ([place])    L RA stent 1994  . RAS (renal artery stenosis) ([place])    a. renal dopp (12/18/08)- Right RA-<60%; L RA stent- open; nl size and shape in both kidneys;  b.  Rena Artery [name] (3/16):  SMA with > 70% stenosis, Bilateral prox RA with 1-59%  . Renal insufficiency, mild    05/01/12 Creat 1.47  . Restless leg syndrome   . Type 2 diabetes mellitus with diabetic neuropathy ([place])   . VT (ventricular tachycardia) ([place]) 9/11   polymorphic VT probably related to sotalol therapy  . Wears glasses    Past Surgical History:  Procedure Laterality Date  . ABDOMINAL HYSTERECTOMY    . APPENDECTOMY    . AV NODE ABLATION  06/08/2006   performed by Dr. Beckie Salts; due to Afib with RVR and tachy brady syndrome  . BIV ICD GENERATOR CHANGEOUT N/A 12/31/2017   Procedure: BIV ICD GENERATOR CHANGEOUT;  Surgeon: Evans Lance, MD;  Location: [place] CV LAB;  Service: Cardiovascular;  Laterality: N/A;  . BIV ICD GENERTAOR CHANGE OUT  11/15/10   Medtronic Protecta [id number] serial #[id number]  . BiV ICD Placement  06/17/2007; 04/20/14   Medtronic Concerta [id number]; RA lead-Med 5076/53cm, [id number]; RV lead- SJM 7121/65cm, [id number]; LV lead- Med 4194/88cm, [id number]; gen change 04/2014 by Dr Lovena Le  . BIV PACEMAKER GENERATOR CHANGE OUT N/A 04/20/2014   Procedure: BIV PACEMAKER GENERATOR CHANGE OUT;  Surgeon: Evans Lance, MD;  Location: [facility] CATH LAB;  Service: Cardiovascular;  Laterality: N/A;  . BRAIN MENINGIOMA EXCISION  2011   L frontal meningioma at [facility]   . BREAST SURGERY     lumpectomy right  . CARDIOVERSION  07/21/05   converted to sinus brady  . CAROTID ENDARTERECTOMY  Right 09/24/2018   with bovine pericardial patch angioplasty  . CATARACT EXTRACTION W/ INTRAOCULAR LENS  IMPLANT, BILATERAL    . CHOLECYSTECTOMY     Dr. Sherald Hess in [place] and Dr. Lyda Jester follows; surgical stricture post procedure with stent placement  . COLONOSCOPY W/ BIOPSIES AND POLYPECTOMY    . DILATION AND CURETTAGE OF UTERUS    . ENDARTERECTOMY Right 09/24/2018   Procedure: ENDARTERECTOMY CAROTID RIGHT;  Surgeon: Angelia Mould, MD;  Location: [place];  Service: Vascular;  Laterality: Right;  . PV Angio  1994   L Renal artery stent     Family History  Problem Relation Age of  Onset  . Heart failure Mother   . Hypertension Mother   . Cancer Father        lung  . Dementia Sister   . Cancer Sister        pancreatic, renal and thyroid  . Heart attack Brother   . AAA (abdominal aortic aneurysm) Brother   . Stroke Maternal Grandmother   . Diabetes Other   . Stroke Son    Social History   Socioeconomic History  . Marital status: Married    Spouse name: Not on file  . Number of children: 6  . Years of education: Not on file  . Highest education level: Not on file  Occupational History  . Not on file  Tobacco Use  . Smoking status: Never Smoker  . Smokeless tobacco: Never Used  Vaping Use  . Vaping Use: Never used  Substance and Sexual Activity  . Alcohol use: No    Alcohol/week: 0.0 standard drinks  . Drug use: No  . Sexual activity: Not Currently  Other Topics Concern  . Not on file  Social History Narrative   wears sunscreen, brushes and flosses daily, see's dentist bi-annually, has smoke/carbon monoxide detectors, wears a seatbelt and practices gun safety   Social Determinants of Health   Financial Resource Strain: Not on file  Food Insecurity: Not on file  Transportation Needs: Not on file  Physical Activity: Not on file  Stress: Not on file  Social Connections: Not on file    Review of Systems  Constitutional: Positive for fatigue.  Negative for chills and fever.  HENT: Positive for congestion. Negative for ear pain, rhinorrhea and sore throat.   Respiratory: Negative for cough and shortness of breath.   Cardiovascular: Negative for chest pain.  Gastrointestinal: Negative for abdominal pain, constipation, diarrhea, nausea and vomiting.  Genitourinary: Negative for dysuria and urgency.  Musculoskeletal: Positive for arthralgias (left knee pain ). Negative for back pain and myalgias.  Neurological: Positive for weakness and light-headedness. Negative for dizziness and headaches.  Psychiatric/Behavioral: Negative for dysphoric mood. The patient is not nervous/anxious.      Objective:  There were no vitals taken for this visit.  BP/Weight 02/01/2021 01/25/2021 01/15/8118  Systolic BP 147 829 562  Diastolic BP 54 57 70  Wt. (Lbs) 149 149 150.2  BMI 22.66 22.66 22.84    Physical Exam Vitals reviewed.  Constitutional:      Appearance: Normal appearance. She is normal weight.  HENT:     Right Ear: There is impacted cerumen.     Left Ear: Tympanic membrane, ear canal and external ear normal. There is no impacted cerumen.     Nose: No congestion or rhinorrhea.     Mouth/Throat:     Pharynx: No oropharyngeal exudate or posterior oropharyngeal erythema.  Neck:     Vascular: No carotid bruit.  Cardiovascular:     Rate and Rhythm: Normal rate. Rhythm irregular.     Pulses: Normal pulses.     Heart sounds: Normal heart sounds.  Pulmonary:     Effort: Pulmonary effort is normal. No respiratory distress.     Breath sounds: Normal breath sounds.  Abdominal:     General: Abdomen is flat. Bowel sounds are normal.     Palpations: Abdomen is soft.     Tenderness: There is no abdominal tenderness.  Neurological:     Mental Status: She is alert and oriented to person, place, and time.  Psychiatric:  Mood and Affect: Mood normal.        Behavior: Behavior normal.     Diabetic Foot Exam - Simple   No data filed       Lab Results  Component Value Date   WBC 3.9 03/01/2021   HGB 9.2 (L) 03/01/2021   HCT 29.5 (L) 03/01/2021   PLT 294 03/01/2021   GLUCOSE 89 03/01/2021   CHOL 92 (L) 03/01/2021   TRIG 39 03/01/2021   HDL 36 (L) 03/01/2021   LDLCALC 45 03/01/2021   ALT 15 03/01/2021   AST 26 03/01/2021   NA 134 03/01/2021   K 4.7 03/01/2021   CL 96 03/01/2021   CREATININE 2.43 (H) 03/01/2021   BUN 87 (HH) 03/01/2021   CO2 20 03/01/2021   TSH 2.440 05/25/2020   INR 0.96 09/24/2018   HGBA1C 5.0 12/03/2020   MICROALBUR 10 12/25/2019      Assessment & Plan:   1. Hypertensive heart disease with heart failure ([place]) 2. Permanent atrial fibrillation ([place]) 3. Type 2 diabetes mellitus with diabetic neuropathy, unspecified whether long term insulin use ([place]) 4. Presence of automatic (implantable) cardiac defibrillator 5. Iron deficiency anemia due to chronic blood loss 6. Mixed hyperlipidemia 7. Impacted cerumen of right ear   No changes to medicines.  Follow up with GI and Cardiology.  Recommend debrox drops. May return in 1 week for irrigation.  Follow-up: No follow-ups on file.  An After Visit Summary was printed and given to the patient.  [name], MD [facility] [phone]

## 2021-02-24 DIAGNOSIS — I5023 Acute on chronic systolic (congestive) heart failure: Secondary | ICD-10-CM | POA: Diagnosis not present

## 2021-02-24 DIAGNOSIS — K256 Chronic or unspecified gastric ulcer with both hemorrhage and perforation: Secondary | ICD-10-CM | POA: Diagnosis not present

## 2021-02-24 DIAGNOSIS — N184 Chronic kidney disease, stage 4 (severe): Secondary | ICD-10-CM | POA: Diagnosis not present

## 2021-02-24 DIAGNOSIS — I130 Hypertensive heart and chronic kidney disease with heart failure and stage 1 through stage 4 chronic kidney disease, or unspecified chronic kidney disease: Secondary | ICD-10-CM | POA: Diagnosis not present

## 2021-02-24 DIAGNOSIS — D631 Anemia in chronic kidney disease: Secondary | ICD-10-CM | POA: Diagnosis not present

## 2021-02-24 DIAGNOSIS — L89316 Pressure-induced deep tissue damage of right buttock: Secondary | ICD-10-CM | POA: Diagnosis not present

## 2021-02-24 DIAGNOSIS — D509 Iron deficiency anemia, unspecified: Secondary | ICD-10-CM | POA: Diagnosis not present

## 2021-02-24 DIAGNOSIS — E1122 Type 2 diabetes mellitus with diabetic chronic kidney disease: Secondary | ICD-10-CM | POA: Diagnosis not present

## 2021-02-24 DIAGNOSIS — Q288 Other specified congenital malformations of circulatory system: Secondary | ICD-10-CM | POA: Diagnosis not present

## 2021-03-01 ENCOUNTER — Other Ambulatory Visit: Payer: Self-pay

## 2021-03-01 ENCOUNTER — Ambulatory Visit: Payer: Medicare HMO

## 2021-03-01 DIAGNOSIS — I130 Hypertensive heart and chronic kidney disease with heart failure and stage 1 through stage 4 chronic kidney disease, or unspecified chronic kidney disease: Secondary | ICD-10-CM | POA: Diagnosis not present

## 2021-03-01 DIAGNOSIS — D631 Anemia in chronic kidney disease: Secondary | ICD-10-CM | POA: Diagnosis not present

## 2021-03-01 DIAGNOSIS — E559 Vitamin D deficiency, unspecified: Secondary | ICD-10-CM | POA: Diagnosis not present

## 2021-03-01 DIAGNOSIS — K256 Chronic or unspecified gastric ulcer with both hemorrhage and perforation: Secondary | ICD-10-CM | POA: Diagnosis not present

## 2021-03-01 DIAGNOSIS — E1122 Type 2 diabetes mellitus with diabetic chronic kidney disease: Secondary | ICD-10-CM | POA: Diagnosis not present

## 2021-03-01 DIAGNOSIS — E1140 Type 2 diabetes mellitus with diabetic neuropathy, unspecified: Secondary | ICD-10-CM | POA: Diagnosis not present

## 2021-03-01 DIAGNOSIS — I5023 Acute on chronic systolic (congestive) heart failure: Secondary | ICD-10-CM | POA: Diagnosis not present

## 2021-03-01 DIAGNOSIS — N184 Chronic kidney disease, stage 4 (severe): Secondary | ICD-10-CM | POA: Diagnosis not present

## 2021-03-01 DIAGNOSIS — Q288 Other specified congenital malformations of circulatory system: Secondary | ICD-10-CM | POA: Diagnosis not present

## 2021-03-01 DIAGNOSIS — L89316 Pressure-induced deep tissue damage of right buttock: Secondary | ICD-10-CM | POA: Diagnosis not present

## 2021-03-01 DIAGNOSIS — D509 Iron deficiency anemia, unspecified: Secondary | ICD-10-CM | POA: Diagnosis not present

## 2021-03-02 ENCOUNTER — Other Ambulatory Visit: Payer: Self-pay

## 2021-03-02 DIAGNOSIS — R799 Abnormal finding of blood chemistry, unspecified: Secondary | ICD-10-CM

## 2021-03-02 DIAGNOSIS — D508 Other iron deficiency anemias: Secondary | ICD-10-CM

## 2021-03-02 DIAGNOSIS — N184 Chronic kidney disease, stage 4 (severe): Secondary | ICD-10-CM

## 2021-03-02 LAB — COMPREHENSIVE METABOLIC PANEL
ALT: 15 IU/L (ref 0–32)
AST: 26 IU/L (ref 0–40)
Albumin/Globulin Ratio: 1.7 (ref 1.2–2.2)
Albumin: 3.8 g/dL (ref 3.6–4.6)
Alkaline Phosphatase: 39 IU/L — ABNORMAL LOW (ref 44–121)
BUN/Creatinine Ratio: 36 — ABNORMAL HIGH (ref 12–28)
BUN: 87 mg/dL (ref 8–27)
Bilirubin Total: 0.5 mg/dL (ref 0.0–1.2)
CO2: 20 mmol/L (ref 20–29)
Calcium: 9.7 mg/dL (ref 8.7–10.3)
Chloride: 96 mmol/L (ref 96–106)
Creatinine, Ser: 2.43 mg/dL — ABNORMAL HIGH (ref 0.57–1.00)
Globulin, Total: 2.3 g/dL (ref 1.5–4.5)
Glucose: 89 mg/dL (ref 65–99)
Potassium: 4.7 mmol/L (ref 3.5–5.2)
Sodium: 134 mmol/L (ref 134–144)
Total Protein: 6.1 g/dL (ref 6.0–8.5)
eGFR: 19 mL/min/{1.73_m2} — ABNORMAL LOW (ref >59)

## 2021-03-02 LAB — CBC
Hematocrit: 29.5 % — ABNORMAL LOW (ref 34.0–46.6)
Hemoglobin: 9.2 g/dL — ABNORMAL LOW (ref 11.1–15.9)
MCH: 27.6 pg (ref 26.6–33.0)
MCHC: 31.2 g/dL — ABNORMAL LOW (ref 31.5–35.7)
MCV: 89 fL (ref 79–97)
Platelets: 294 10*3/uL (ref 150–450)
RBC: 3.33 x10E6/uL — ABNORMAL LOW (ref 3.77–5.28)
RDW: 21.1 % — ABNORMAL HIGH (ref 11.7–15.4)
WBC: 3.9 10*3/uL (ref 3.4–10.8)

## 2021-03-02 LAB — CARDIOVASCULAR RISK ASSESSMENT

## 2021-03-02 LAB — LIPID PANEL
Chol/HDL Ratio: 2.6 ratio (ref 0.0–4.4)
Cholesterol, Total: 92 mg/dL — ABNORMAL LOW (ref 100–199)
HDL: 36 mg/dL — ABNORMAL LOW (ref >39)
LDL Chol Calc (NIH): 45 mg/dL (ref 0–99)
Triglycerides: 39 mg/dL (ref 0–149)
VLDL Cholesterol Cal: 11 mg/dL (ref 5–40)

## 2021-03-02 LAB — VITAMIN D 25 HYDROXY (VIT D DEFICIENCY, FRACTURES): Vit D, 25-Hydroxy: 61.7 ng/mL (ref 30.0–100.0)

## 2021-03-03 DIAGNOSIS — K256 Chronic or unspecified gastric ulcer with both hemorrhage and perforation: Secondary | ICD-10-CM | POA: Diagnosis not present

## 2021-03-03 DIAGNOSIS — E1122 Type 2 diabetes mellitus with diabetic chronic kidney disease: Secondary | ICD-10-CM | POA: Diagnosis not present

## 2021-03-03 DIAGNOSIS — L89316 Pressure-induced deep tissue damage of right buttock: Secondary | ICD-10-CM | POA: Diagnosis not present

## 2021-03-03 DIAGNOSIS — I130 Hypertensive heart and chronic kidney disease with heart failure and stage 1 through stage 4 chronic kidney disease, or unspecified chronic kidney disease: Secondary | ICD-10-CM | POA: Diagnosis not present

## 2021-03-03 DIAGNOSIS — D509 Iron deficiency anemia, unspecified: Secondary | ICD-10-CM | POA: Diagnosis not present

## 2021-03-03 DIAGNOSIS — Q288 Other specified congenital malformations of circulatory system: Secondary | ICD-10-CM | POA: Diagnosis not present

## 2021-03-03 DIAGNOSIS — I5023 Acute on chronic systolic (congestive) heart failure: Secondary | ICD-10-CM | POA: Diagnosis not present

## 2021-03-03 DIAGNOSIS — N184 Chronic kidney disease, stage 4 (severe): Secondary | ICD-10-CM | POA: Diagnosis not present

## 2021-03-03 DIAGNOSIS — D631 Anemia in chronic kidney disease: Secondary | ICD-10-CM | POA: Diagnosis not present

## 2021-03-08 DIAGNOSIS — I129 Hypertensive chronic kidney disease with stage 1 through stage 4 chronic kidney disease, or unspecified chronic kidney disease: Secondary | ICD-10-CM | POA: Diagnosis not present

## 2021-03-08 DIAGNOSIS — I701 Atherosclerosis of renal artery: Secondary | ICD-10-CM | POA: Diagnosis not present

## 2021-03-08 DIAGNOSIS — I420 Dilated cardiomyopathy: Secondary | ICD-10-CM | POA: Diagnosis not present

## 2021-03-08 DIAGNOSIS — N184 Chronic kidney disease, stage 4 (severe): Secondary | ICD-10-CM | POA: Diagnosis not present

## 2021-03-08 DIAGNOSIS — N390 Urinary tract infection, site not specified: Secondary | ICD-10-CM | POA: Diagnosis not present

## 2021-03-08 DIAGNOSIS — E785 Hyperlipidemia, unspecified: Secondary | ICD-10-CM | POA: Diagnosis not present

## 2021-03-08 DIAGNOSIS — E1143 Type 2 diabetes mellitus with diabetic autonomic (poly)neuropathy: Secondary | ICD-10-CM | POA: Diagnosis not present

## 2021-03-09 ENCOUNTER — Telehealth: Payer: Self-pay

## 2021-03-09 NOTE — Progress Notes (Signed)
Chronic Care Management Pharmacy Assistant   Name: Akacia Boltz  MRN: [id number] DOB: [birth year]   Reason for Encounter: Disease State for hypertension    Recent office visits:  03/02/21-Labs ordered, She has not had any blood in her stools that she is aware. She will pick-up stool cards. Anemia improved.  Kidney function worsened. Hold spironolactone. Recommend increase water. Repeat CMP in 2 weeks. Please ask if pt has seen nephrology.If so, please send labs and tell her I would like her to follow up within the next week. If no nephrologist, pt needs one. Start referral. Liver function normal. Lipids well controlled. Vitamin D normal  02/22/21-Dr. Cox PCP, HTN with CHF, Knee pain- fell on Saturday has had increased pain since fall, Use debrox drops to rt ear as directed.  Return in one week for ear washing and fasting lab work.  Recent consult visits:  none  Hospital visits:  None in previous 6 months  Medications: Outpatient Encounter Medications as of 03/09/2021  Medication Sig  . Accu-Chek FastClix Lancets MISC TEST BLOOD SUGAR TWICE DAILY E11.69  . Alcohol Swabs (B-D SINGLE USE SWABS REGULAR) PADS USE TWICE DAILY  E11.69  . allopurinol (ZYLOPRIM) 100 MG tablet   . ALPRAZolam (XANAX) 0.5 MG tablet TAKE ONE TABLET BY MOUTH EVERY NIGHT AT BEDTIME FOR INSOMNIA  . amLODipine (NORVASC) 5 MG tablet TAKE 1 TABLET EVERY DAY  . calcium carbonate (OSCAL) 1500 (600 Ca) MG TABS tablet Take 600 mg of elemental calcium by mouth 2 (two) times daily with a meal.  . digoxin (LANOXIN) 0.125 MG tablet TAKE 1/2 TABLET EVERY DAY (SUBSTITUTED FOR  DIGITEK)  . DULoxetine (CYMBALTA) 20 MG capsule TAKE 1 CAPSULE BY MOUTH EVERYDAY AT BEDTIME  . ezetimibe (ZETIA) 10 MG tablet TAKE 1 TABLET EVERY DAY  . fenofibrate micronized (LOFIBRA) 134 MG capsule TAKE 1 CAPSULE (134 MG TOTAL) BY MOUTH DAILY.  . fluticasone (FLONASE) 50 MCG/ACT nasal spray   . glucose blood (ACCU-CHEK SMARTVIEW)  test strip CHECK BLOOD SUGAR TWICE DAILY  . levothyroxine (SYNTHROID) 50 MCG tablet Take 1 tablet (50 mcg total) by mouth daily before breakfast.  . losartan (COZAAR) 100 MG tablet TAKE 1 TABLET EVERY DAY  . Multiple Vitamin (MULTIVITAMIN WITH MINERALS) TABS tablet Take 1 tablet by mouth daily. Centrum Silver  . Naphazoline-Pheniramine (OPCON-A) 0.027-0.315 % SOLN Apply 1 drop to eye in the morning and at bedtime.  Marland Kitchen nystatin cream (MYCOSTATIN) Apply 1 application topically 3 (three) times daily. Cream to back rash  . olopatadine (PATANOL) 0.1 % ophthalmic solution Place 1 drop into both eyes 2 (two) times daily.  Marland Kitchen omega-3 acid ethyl esters (LOVAZA) 1 g capsule TAKE 1 CAPSULE TWICE DAILY  . oxyCODONE-acetaminophen (PERCOCET) 7.5-325 MG tablet Take 1 tablet by mouth every 6 (six) hours as needed (back pain.).  Marland Kitchen polyethylene glycol (MIRALAX / GLYCOLAX) 17 g packet Take 17 g by mouth daily as needed.   . pravastatin (PRAVACHOL) 10 MG tablet TAKE 1 TABLET FOUR TIMES WEEKLY  . rOPINIRole (REQUIP) 1 MG tablet TAKE 1 TABLET AT BEDTIME  . senna (SENOKOT) 8.6 MG TABS tablet Take 2 tablets by mouth daily.  Marland Kitchen tiZANidine (ZANAFLEX) 4 MG tablet TAKE 1 TABLET AT BEDTIME  . XARELTO 15 MG TABS tablet TAKE 1 TABLET DAILY WITH SUPPER.   No facility-administered encounter medications on file as of 03/09/2021.     Recent Office Vitals: BP Readings from Last 3 Encounters:  02/01/21 (!) 139/54  01/25/21 Marland Kitchen)  149/57  01/21/21 (!) 165/70   Pulse Readings from Last 3 Encounters:  02/01/21 70  01/25/21 75  01/21/21 76    Wt Readings from Last 3 Encounters:  02/01/21 149 lb (67.6 kg)  01/25/21 149 lb (67.6 kg)  01/21/21 150 lb 3.2 oz (68.1 kg)     Kidney Function Lab Results  Component Value Date/Time   CREATININE 2.43 (H) 03/01/2021 11:33 AM   CREATININE 2.2 (A) 01/21/2021 12:00 AM   CREATININE 1.94 (H) 01/13/2021 11:46 AM   GFR 38.32 (L) 11/30/2014 12:13 PM   GFRNONAA 34 (L) 12/03/2020 11:45 AM    GFRAA 39 (L) 12/03/2020 11:45 AM    BMP Latest Ref Rng & Units 03/01/2021 01/21/2021 01/13/2021  Glucose 65 - 99 mg/dL 89 - 84  BUN 8 - 27 mg/dL 87(HH) 82(A) 60(H)  Creatinine 0.57 - 1.00 mg/dL 2.43(H) 2.2(A) 1.94(H)  BUN/Creat Ratio 12 - 28 36(H) - 31(H)  Sodium 134 - 144 mmol/L 134 132(A) 138  Potassium 3.5 - 5.2 mmol/L 4.7 4.8 5.1  Chloride 96 - 106 mmol/L 96 95(A) 98  CO2 20 - 29 mmol/L 20 29(A) 24  Calcium 8.7 - 10.3 mg/dL 9.7 10.4 11.2(H)     . Current antihypertensive regimen:   amlodipine 5 mg daily   Losartan 100 mg daily   Spironolactone 25 mg 1/2 tablet daily     Patient verbally confirms she is taking the above medications as directed. Yes  . How often are you checking your Blood Pressure? daily  . Current home BP readings:   Patient stated her readings are between 129/58  To 130/78  . Wrist or arm cuff: patient uses an arm cuff . Caffeine intake: patient does drink coffee . Salt intake: patient watches her salt . OTC medications including pseudoephedrine or NSAIDs? She is using Tylenol muscle and pain, once in a while she will take Oxycodone  . Any readings above 180/120? No  . What recent interventions/DTPs have been made by any provider to improve Blood Pressure control since last CPP Visit: Patient is taking her medications, she goes back to kidney doctor in 2 weeks  . Any recent hospitalizations or ED visits since last visit with CPP? No  . What diet changes have been made to improve Blood Pressure Control?  Patient watches her salt and sugar intake, stated her glucose levels are 117 or below  . What exercise is being done to improve your Blood Pressure Control?  Patient is having knee pain from recent fall and is not as active, she said she has gotten a brace and its helping a little  Adherence Review: Is the patient currently on ACE/ARB medication? Yes Does the patient have >5 day gap between last estimated fill dates? CPP to review   Star Rating  Drugs:  Medication:  Last Fill: Day Supply Levothyroxine 11/30/20 90 Losartan  11/30/20 90 Pravastatin  01/11/21  84 Xarelto  01/04/21 90 Amlodipine  11/30/20 [place], [place] Pharmacist Assistant [phone]

## 2021-03-09 NOTE — Progress Notes (Signed)
Called [employer]  Refills were done on Feb 08, 2021 for the following medications:  Losartan  90ds Amlodipine  90ds  Donette Larry, CPP notified

## 2021-03-12 ENCOUNTER — Other Ambulatory Visit: Payer: Self-pay | Admitting: Family Medicine

## 2021-03-13 ENCOUNTER — Encounter: Payer: Self-pay | Admitting: Family Medicine

## 2021-03-24 DIAGNOSIS — N189 Chronic kidney disease, unspecified: Secondary | ICD-10-CM | POA: Diagnosis not present

## 2021-03-24 DIAGNOSIS — N184 Chronic kidney disease, stage 4 (severe): Secondary | ICD-10-CM | POA: Diagnosis not present

## 2021-03-24 DIAGNOSIS — D631 Anemia in chronic kidney disease: Secondary | ICD-10-CM | POA: Diagnosis not present

## 2021-03-24 DIAGNOSIS — D509 Iron deficiency anemia, unspecified: Secondary | ICD-10-CM | POA: Diagnosis not present

## 2021-03-24 DIAGNOSIS — I129 Hypertensive chronic kidney disease with stage 1 through stage 4 chronic kidney disease, or unspecified chronic kidney disease: Secondary | ICD-10-CM | POA: Diagnosis not present

## 2021-03-24 DIAGNOSIS — I509 Heart failure, unspecified: Secondary | ICD-10-CM | POA: Diagnosis not present

## 2021-03-29 ENCOUNTER — Other Ambulatory Visit: Payer: Self-pay | Admitting: Family Medicine

## 2021-03-30 ENCOUNTER — Other Ambulatory Visit: Payer: Self-pay | Admitting: Family Medicine

## 2021-04-05 ENCOUNTER — Ambulatory Visit (INDEPENDENT_AMBULATORY_CARE_PROVIDER_SITE_OTHER): Payer: Medicare HMO

## 2021-04-05 DIAGNOSIS — I459 Conduction disorder, unspecified: Secondary | ICD-10-CM

## 2021-04-05 LAB — CUP PACEART REMOTE DEVICE CHECK
Battery Remaining Longevity: 85 mo
Battery Voltage: 2.98 V
Brady Statistic AP VP Percent: 0.05 %
Brady Statistic AP VS Percent: 99.08 %
Brady Statistic AS VP Percent: 0 %
Brady Statistic AS VS Percent: 0.87 %
Brady Statistic RA Percent Paced: 99.11 %
Brady Statistic RV Percent Paced: 0.05 %
Date Time Interrogation Session: 20220628005513
Implantable Lead Implant Date: 20080908
Implantable Lead Implant Date: 20080908
Implantable Lead Implant Date: 20190326
Implantable Lead Location: 753858
Implantable Lead Location: 753860
Implantable Lead Location: 753860
Implantable Lead Model: 3830
Implantable Lead Model: 4194
Implantable Lead Model: 7121
Implantable Pulse Generator Implant Date: 20190326
Lead Channel Impedance Value: 190 Ohm
Lead Channel Impedance Value: 247 Ohm
Lead Channel Impedance Value: 304 Ohm
Lead Channel Impedance Value: 323 Ohm
Lead Channel Impedance Value: 361 Ohm
Lead Channel Impedance Value: 380 Ohm
Lead Channel Impedance Value: 437 Ohm
Lead Channel Impedance Value: 456 Ohm
Lead Channel Impedance Value: 475 Ohm
Lead Channel Pacing Threshold Amplitude: 1.125 V
Lead Channel Pacing Threshold Pulse Width: 0.4 ms
Lead Channel Sensing Intrinsic Amplitude: 5.5 mV
Lead Channel Sensing Intrinsic Amplitude: 5.5 mV
Lead Channel Sensing Intrinsic Amplitude: 8.25 mV
Lead Channel Sensing Intrinsic Amplitude: 8.25 mV
Lead Channel Setting Pacing Amplitude: 2 V
Lead Channel Setting Pacing Amplitude: 2.5 V
Lead Channel Setting Pacing Pulse Width: 0.4 ms
Lead Channel Setting Sensing Sensitivity: 1.2 mV

## 2021-04-08 ENCOUNTER — Other Ambulatory Visit: Payer: Self-pay | Admitting: Family Medicine

## 2021-04-08 ENCOUNTER — Telehealth: Payer: Self-pay

## 2021-04-08 NOTE — Progress Notes (Signed)
Chronic Care Management Pharmacy Assistant   Name: [name]  MRN: [id number] DOB: Feb 27, 1934  Reason for Encounter: Disease State for diabetes  Recent office visits:  03/02/21-Dr Cox PCP, Labs Anemia improved.  Kidney function worsened. Hold spironolactone. Recommend increase water. RepeatCMP in 2 weeks. Please ask if pt has seen nephrology.If so, please send labs and tell her I would like her to follow up within the next week. If no nephrologist, pt needsone. Start referral. Liver function normal.  Lipids well controlled.  Vitamin D normal  02/22/21-Dr Cox PCP, congestive heart failure, no medication changes, return one week for ear flushing.   Recent consult visits:  none  Hospital visits:  12/21/20-ED, congestive heart failure 12/10/20-admission for Hypertensive heart disease without failure Medications: Outpatient Encounter Medications as of 04/08/2021  Medication Sig   Accu-Chek FastClix Lancets MISC TEST BLOOD SUGAR TWICE DAILY E11.69   ACCU-CHEK SMARTVIEW test strip CHECK BLOOD SUGAR TWICE DAILY   Alcohol Swabs (B-D SINGLE USE SWABS REGULAR) PADS USE TWICE DAILY  E11.69   allopurinol (ZYLOPRIM) 100 MG tablet    ALPRAZolam (XANAX) 0.5 MG tablet TAKE ONE TABLET BY MOUTH EVERY NIGHT AT BEDTIME FOR INSOMNIA   amLODipine (NORVASC) 5 MG tablet TAKE 1 TABLET EVERY DAY   calcium carbonate (OSCAL) 1500 (600 Ca) MG TABS tablet Take 600 mg of elemental calcium by mouth 2 (two) times daily with a meal.   digoxin (LANOXIN) 0.125 MG tablet TAKE 1/2 TABLET EVERY DAY (SUBSTITUTED FOR  DIGITEK)   DULoxetine (CYMBALTA) 20 MG capsule TAKE 1 CAPSULE BY MOUTH EVERYDAY AT BEDTIME   ezetimibe (ZETIA) 10 MG tablet TAKE 1 TABLET EVERY DAY   fenofibrate micronized (LOFIBRA) 134 MG capsule TAKE 1 CAPSULE (134 MG TOTAL) BY MOUTH DAILY.   fluticasone (FLONASE) 50 MCG/ACT nasal spray USE 2 SPRAYS IN EACH NOSTRIL EVERY DAY AS NEEDED   levothyroxine (SYNTHROID) 50 MCG tablet Take 1  tablet (50 mcg total) by mouth daily before breakfast.   losartan (COZAAR) 100 MG tablet TAKE 1 TABLET EVERY DAY   Multiple Vitamin (MULTIVITAMIN WITH MINERALS) TABS tablet Take 1 tablet by mouth daily. Centrum Silver   Naphazoline-Pheniramine (OPCON-A) 0.027-0.315 % SOLN Apply 1 drop to eye in the morning and at bedtime.   nystatin cream (MYCOSTATIN) Apply 1 application topically 3 (three) times daily. Cream to back rash   olopatadine (PATANOL) 0.1 % ophthalmic solution Place 1 drop into both eyes 2 (two) times daily.   omega-3 acid ethyl esters (LOVAZA) 1 g capsule TAKE 1 CAPSULE TWICE DAILY   oxyCODONE-acetaminophen (PERCOCET) 7.5-325 MG tablet Take 1 tablet by mouth every 6 (six) hours as needed (back pain.).   polyethylene glycol (MIRALAX / GLYCOLAX) 17 g packet Take 17 g by mouth daily as needed.    pravastatin (PRAVACHOL) 10 MG tablet TAKE 1 TABLET FOUR TIMES WEEKLY   rOPINIRole (REQUIP) 1 MG tablet TAKE 1 TABLET AT BEDTIME   senna (SENOKOT) 8.6 MG TABS tablet Take 2 tablets by mouth daily.   tiZANidine (ZANAFLEX) 4 MG tablet TAKE 1 TABLET AT BEDTIME   XARELTO 15 MG TABS tablet TAKE 1 TABLET DAILY WITH SUPPER.   No facility-administered encounter medications on file as of 04/08/2021.   Recent Relevant Labs: Lab Results  Component Value Date/Time   HGBA1C 5.0 12/03/2020 11:53 AM   HGBA1C 5.7 (H) 08/26/2020 01:28 PM   MICROALBUR 10 12/25/2019 12:45 PM    Kidney Function Lab Results  Component Value Date/Time   CREATININE 2.43 (  H) 03/01/2021 11:33 AM   CREATININE 2.2 (A) 01/21/2021 12:00 AM   CREATININE 1.94 (H) 01/13/2021 11:46 AM   GFR 38.32 (L) 11/30/2014 12:13 PM   GFRNONAA 34 (L) 12/03/2020 11:45 AM   GFRAA 39 (L) 12/03/2020 11:45 AM     Current antihyperglycemic regimen:  No current medications    What recent interventions/DTPs have been made to improve glycemic control:  Patient stated she is doing well.  She is watching her diet and staying active.   Patient denies  hypoglycemic symptoms  Patient denies hyperglycemic symptoms  How often are you checking your blood sugar? once daily sometimes twice   What are your blood sugars ranging? Patient stated her numbers have been between 108-125  During the week, how often does your blood glucose drop below 70? Never  Are you checking your feet daily/regularly? Yes  Adherence Review: Is the patient currently on a STATIN medication? Yes Is the patient currently on ACE/ARB medication? no Does the patient have >5 day gap between last estimated fill dates? CPP to review  Care Gaps: Last eye exam / Retinopathy Screening? None listed Last Annual Wellness Visit?  None listed Last Diabetic Foot Exam?  None listed    Star Rating Drugs:  Medication:  Last Fill: Day Supply Levothyroxine           02/06/21  90 Losartan                     02/08/21   90 Pravastatin                 01/11/21              84 Xarelto                        02/15/21 90 Amlodipine    02/08/21   [place], [place] Pharmacist Assistant [phone]

## 2021-04-13 ENCOUNTER — Other Ambulatory Visit: Payer: Self-pay | Admitting: Family Medicine

## 2021-04-13 ENCOUNTER — Telehealth: Payer: Self-pay | Admitting: Cardiovascular Disease

## 2021-04-13 DIAGNOSIS — E039 Hypothyroidism, unspecified: Secondary | ICD-10-CM | POA: Diagnosis not present

## 2021-04-13 DIAGNOSIS — I428 Other cardiomyopathies: Secondary | ICD-10-CM | POA: Diagnosis not present

## 2021-04-13 DIAGNOSIS — I340 Nonrheumatic mitral (valve) insufficiency: Secondary | ICD-10-CM | POA: Diagnosis not present

## 2021-04-13 DIAGNOSIS — Z7901 Long term (current) use of anticoagulants: Secondary | ICD-10-CM | POA: Diagnosis not present

## 2021-04-13 DIAGNOSIS — I5023 Acute on chronic systolic (congestive) heart failure: Secondary | ICD-10-CM | POA: Diagnosis not present

## 2021-04-13 DIAGNOSIS — I119 Hypertensive heart disease without heart failure: Secondary | ICD-10-CM | POA: Diagnosis not present

## 2021-04-13 DIAGNOSIS — D631 Anemia in chronic kidney disease: Secondary | ICD-10-CM | POA: Diagnosis not present

## 2021-04-13 DIAGNOSIS — J9601 Acute respiratory failure with hypoxia: Secondary | ICD-10-CM | POA: Diagnosis not present

## 2021-04-13 DIAGNOSIS — I509 Heart failure, unspecified: Secondary | ICD-10-CM | POA: Diagnosis not present

## 2021-04-13 DIAGNOSIS — E1169 Type 2 diabetes mellitus with other specified complication: Secondary | ICD-10-CM | POA: Diagnosis not present

## 2021-04-13 DIAGNOSIS — E119 Type 2 diabetes mellitus without complications: Secondary | ICD-10-CM | POA: Diagnosis not present

## 2021-04-13 DIAGNOSIS — I480 Paroxysmal atrial fibrillation: Secondary | ICD-10-CM | POA: Diagnosis not present

## 2021-04-13 DIAGNOSIS — Z23 Encounter for immunization: Secondary | ICD-10-CM | POA: Diagnosis not present

## 2021-04-13 DIAGNOSIS — I4891 Unspecified atrial fibrillation: Secondary | ICD-10-CM | POA: Diagnosis not present

## 2021-04-13 DIAGNOSIS — Z95810 Presence of automatic (implantable) cardiac defibrillator: Secondary | ICD-10-CM | POA: Diagnosis not present

## 2021-04-13 DIAGNOSIS — I517 Cardiomegaly: Secondary | ICD-10-CM | POA: Diagnosis not present

## 2021-04-13 DIAGNOSIS — J90 Pleural effusion, not elsewhere classified: Secondary | ICD-10-CM | POA: Diagnosis not present

## 2021-04-13 DIAGNOSIS — R0602 Shortness of breath: Secondary | ICD-10-CM | POA: Diagnosis not present

## 2021-04-13 DIAGNOSIS — D500 Iron deficiency anemia secondary to blood loss (chronic): Secondary | ICD-10-CM | POA: Diagnosis not present

## 2021-04-13 DIAGNOSIS — N184 Chronic kidney disease, stage 4 (severe): Secondary | ICD-10-CM | POA: Diagnosis not present

## 2021-04-13 DIAGNOSIS — F419 Anxiety disorder, unspecified: Secondary | ICD-10-CM | POA: Diagnosis not present

## 2021-04-13 DIAGNOSIS — I110 Hypertensive heart disease with heart failure: Secondary | ICD-10-CM | POA: Diagnosis not present

## 2021-04-13 DIAGNOSIS — I071 Rheumatic tricuspid insufficiency: Secondary | ICD-10-CM | POA: Diagnosis not present

## 2021-04-13 DIAGNOSIS — I130 Hypertensive heart and chronic kidney disease with heart failure and stage 1 through stage 4 chronic kidney disease, or unspecified chronic kidney disease: Secondary | ICD-10-CM | POA: Diagnosis not present

## 2021-04-13 MED ORDER — OXYCODONE-ACETAMINOPHEN 7.5-325 MG PO TABS: 1.0000 | ORAL_TABLET | Freq: Four times a day (QID) | ORAL | 0 refills | Status: DC | PRN

## 2021-04-13 NOTE — Telephone Encounter (Signed)
Attempted return phone call to pt's daughter, DPR.  Call was sent to voicemail.  Left voicemail message to contact RN at [phone] if needed.

## 2021-04-13 NOTE — Telephone Encounter (Signed)
PT's daughter is calling back she Is at the hospital now with her mom in [place]

## 2021-04-13 NOTE — Telephone Encounter (Signed)
Pt c/o Shortness Of Breath: STAT if SOB developed within the last 24 hours or pt is noticeably SOB on the phone  1. Are you currently SOB (can you hear that pt is SOB on the phone)? Yes  2. How long have you been experiencing SOB? Last couple of days   3. Are you SOB when sitting or when up moving around? both  4. Are you currently experiencing any other symptoms?

## 2021-04-13 NOTE — Telephone Encounter (Signed)
Spoke with pt's daughter, Suanne Marker, [place] who reports pt has had more SOB over the last 2 days than usual.and not feeling well.  Reports she noticed this last night while pt was lying in the bed.  Pt has not had to increase number of pillows she sleeps on.  Daughter reports some mild non pitting edema in lower extremities.  Pt's last CBC and CMET on 03/01/2021 show worsening renal function as well as hgb of 9.2.  Pt is scheduled for PCP f/u on 06/02/2021.  Pt's last PPM remote check reported as normal.  Pt currently denies CP, severe SOB or dizziness. Requested pt send device transmission for review.  Encouraged pt's daughter to contact pt's PCP re: increased SOB as well due to other medical conditions that could be contributing to her SOB and generally not feeling well.  I do not see that she is on a diuretic. Will have device clinic review device transmission once it is received.  Pt's daughter verbalizes understanding and agrees with current plan.

## 2021-04-14 DIAGNOSIS — I340 Nonrheumatic mitral (valve) insufficiency: Secondary | ICD-10-CM

## 2021-04-14 DIAGNOSIS — I509 Heart failure, unspecified: Secondary | ICD-10-CM

## 2021-04-14 DIAGNOSIS — Z95810 Presence of automatic (implantable) cardiac defibrillator: Secondary | ICD-10-CM

## 2021-04-14 DIAGNOSIS — E1169 Type 2 diabetes mellitus with other specified complication: Secondary | ICD-10-CM

## 2021-04-14 DIAGNOSIS — I071 Rheumatic tricuspid insufficiency: Secondary | ICD-10-CM

## 2021-04-14 DIAGNOSIS — I4891 Unspecified atrial fibrillation: Secondary | ICD-10-CM

## 2021-04-15 DIAGNOSIS — Z95810 Presence of automatic (implantable) cardiac defibrillator: Secondary | ICD-10-CM | POA: Diagnosis not present

## 2021-04-15 DIAGNOSIS — E1169 Type 2 diabetes mellitus with other specified complication: Secondary | ICD-10-CM | POA: Diagnosis not present

## 2021-04-15 DIAGNOSIS — I509 Heart failure, unspecified: Secondary | ICD-10-CM | POA: Diagnosis not present

## 2021-04-15 DIAGNOSIS — I071 Rheumatic tricuspid insufficiency: Secondary | ICD-10-CM | POA: Diagnosis not present

## 2021-04-16 DIAGNOSIS — Z7901 Long term (current) use of anticoagulants: Secondary | ICD-10-CM

## 2021-04-16 DIAGNOSIS — I119 Hypertensive heart disease without heart failure: Secondary | ICD-10-CM

## 2021-04-16 DIAGNOSIS — I428 Other cardiomyopathies: Secondary | ICD-10-CM

## 2021-04-16 DIAGNOSIS — N184 Chronic kidney disease, stage 4 (severe): Secondary | ICD-10-CM

## 2021-04-16 DIAGNOSIS — Z95810 Presence of automatic (implantable) cardiac defibrillator: Secondary | ICD-10-CM

## 2021-04-16 DIAGNOSIS — I509 Heart failure, unspecified: Secondary | ICD-10-CM

## 2021-04-16 DIAGNOSIS — E119 Type 2 diabetes mellitus without complications: Secondary | ICD-10-CM

## 2021-04-16 DIAGNOSIS — I340 Nonrheumatic mitral (valve) insufficiency: Secondary | ICD-10-CM

## 2021-04-19 DIAGNOSIS — J9621 Acute and chronic respiratory failure with hypoxia: Secondary | ICD-10-CM | POA: Diagnosis not present

## 2021-04-19 DIAGNOSIS — I480 Paroxysmal atrial fibrillation: Secondary | ICD-10-CM | POA: Diagnosis not present

## 2021-04-19 DIAGNOSIS — I130 Hypertensive heart and chronic kidney disease with heart failure and stage 1 through stage 4 chronic kidney disease, or unspecified chronic kidney disease: Secondary | ICD-10-CM | POA: Diagnosis not present

## 2021-04-19 DIAGNOSIS — D500 Iron deficiency anemia secondary to blood loss (chronic): Secondary | ICD-10-CM | POA: Diagnosis not present

## 2021-04-19 DIAGNOSIS — E875 Hyperkalemia: Secondary | ICD-10-CM | POA: Diagnosis not present

## 2021-04-19 DIAGNOSIS — I5023 Acute on chronic systolic (congestive) heart failure: Secondary | ICD-10-CM | POA: Diagnosis not present

## 2021-04-19 DIAGNOSIS — E1122 Type 2 diabetes mellitus with diabetic chronic kidney disease: Secondary | ICD-10-CM | POA: Diagnosis not present

## 2021-04-19 DIAGNOSIS — E7800 Pure hypercholesterolemia, unspecified: Secondary | ICD-10-CM | POA: Diagnosis not present

## 2021-04-19 DIAGNOSIS — N184 Chronic kidney disease, stage 4 (severe): Secondary | ICD-10-CM | POA: Diagnosis not present

## 2021-04-21 ENCOUNTER — Other Ambulatory Visit: Payer: Self-pay | Admitting: Family Medicine

## 2021-04-21 ENCOUNTER — Telehealth: Payer: Self-pay

## 2021-04-21 DIAGNOSIS — I480 Paroxysmal atrial fibrillation: Secondary | ICD-10-CM | POA: Diagnosis not present

## 2021-04-21 DIAGNOSIS — E7800 Pure hypercholesterolemia, unspecified: Secondary | ICD-10-CM | POA: Diagnosis not present

## 2021-04-21 DIAGNOSIS — N184 Chronic kidney disease, stage 4 (severe): Secondary | ICD-10-CM | POA: Diagnosis not present

## 2021-04-21 DIAGNOSIS — I130 Hypertensive heart and chronic kidney disease with heart failure and stage 1 through stage 4 chronic kidney disease, or unspecified chronic kidney disease: Secondary | ICD-10-CM | POA: Diagnosis not present

## 2021-04-21 DIAGNOSIS — I5023 Acute on chronic systolic (congestive) heart failure: Secondary | ICD-10-CM | POA: Diagnosis not present

## 2021-04-21 DIAGNOSIS — E875 Hyperkalemia: Secondary | ICD-10-CM | POA: Diagnosis not present

## 2021-04-21 DIAGNOSIS — D500 Iron deficiency anemia secondary to blood loss (chronic): Secondary | ICD-10-CM | POA: Diagnosis not present

## 2021-04-21 DIAGNOSIS — J9621 Acute and chronic respiratory failure with hypoxia: Secondary | ICD-10-CM | POA: Diagnosis not present

## 2021-04-21 DIAGNOSIS — E1122 Type 2 diabetes mellitus with diabetic chronic kidney disease: Secondary | ICD-10-CM | POA: Diagnosis not present

## 2021-04-21 NOTE — Telephone Encounter (Signed)
  Transition Care Management Follow-up Telephone Call    [name] 26-Sep-1934  Admit Date: 04/13/21 Discharge Date: 04/17/21 Discharged from where: [facility]  Diagnoses: CHF, acute respiratory failure with hypoxia  2 day post discharge: 04/19/21 7 day post discharge: 04/24/21 14 day post discharge: 05/01/21  [name] was discharged from [facility] on 04/17/21 with the diagnoses listed above.  I was unable to reach her on 7/12 however today I spoke with her in regards to transition of care.  She states that she is feeling much better each day and denies SHOB at this time.   On July 6 [name] presented to the ED with complaints of [facility] which worsened overnight.  She was treated with IV diuresis and given supplemental oxygen which she was weaned off of.    Pro BNP on admission was 20,300 and improved to 7,710 at discharge. Discharge BUN 61, Creatinine 1.50, Sodium 135, Potassium 3.9, Chloride 92, HGB 9.6  They doubled her Bumetanide from 1 mg PO BID to 2 mg PO BID until seen by cardiology or PCP.    Discharge Instructions: Follow-up with PCP in one week.  She has an appointment scheduled with Dr Bobby Rumpf 7/15.  Items Reviewed: Did the pt receive and understand the discharge instructions provided? Yes  Medications obtained and verified? Yes  Any new allergies since your discharge? No  Dietary orders reviewed? Yes Do you have support at home? Yes    Functional Questionnaire: (I = Independent and D = Dependent) ADLs: I  Bathing/Dressing- I  Meal Prep- I  Eating- I  Maintaining continence- I  Transferring/Ambulation- I  Managing Meds- I  Any patient concerns? no  Follow up appointments reviewed: PCP Hospital f/u appt confirmed? No  [name] will call back to schedule with [name] on Monday once she finds a time someone can bring her. Are transportation arrangements needed? Yes  family to provide If their condition worsens,  is the pt aware to call PCP or go to the Emergency Dept.? Yes Was the patient provided with contact information for the PCP's office/after hours number? Yes Was to pt encouraged to call back with questions or concerns? Yes    Shelle Iron, LPN 85/88/50 27:74 AM

## 2021-04-22 ENCOUNTER — Other Ambulatory Visit: Payer: Self-pay | Admitting: Family Medicine

## 2021-04-22 ENCOUNTER — Inpatient Hospital Stay: Payer: Medicare HMO | Attending: Oncology | Admitting: Oncology

## 2021-04-22 ENCOUNTER — Inpatient Hospital Stay: Payer: Medicare HMO

## 2021-04-22 DIAGNOSIS — D509 Iron deficiency anemia, unspecified: Secondary | ICD-10-CM | POA: Insufficient documentation

## 2021-04-22 DIAGNOSIS — Z79899 Other long term (current) drug therapy: Secondary | ICD-10-CM | POA: Insufficient documentation

## 2021-04-22 DIAGNOSIS — Z7901 Long term (current) use of anticoagulants: Secondary | ICD-10-CM | POA: Insufficient documentation

## 2021-04-22 DIAGNOSIS — K922 Gastrointestinal hemorrhage, unspecified: Secondary | ICD-10-CM | POA: Insufficient documentation

## 2021-04-26 DIAGNOSIS — E7800 Pure hypercholesterolemia, unspecified: Secondary | ICD-10-CM | POA: Diagnosis not present

## 2021-04-26 DIAGNOSIS — I5023 Acute on chronic systolic (congestive) heart failure: Secondary | ICD-10-CM | POA: Diagnosis not present

## 2021-04-26 DIAGNOSIS — N184 Chronic kidney disease, stage 4 (severe): Secondary | ICD-10-CM | POA: Diagnosis not present

## 2021-04-26 DIAGNOSIS — E875 Hyperkalemia: Secondary | ICD-10-CM | POA: Diagnosis not present

## 2021-04-26 DIAGNOSIS — I480 Paroxysmal atrial fibrillation: Secondary | ICD-10-CM | POA: Diagnosis not present

## 2021-04-26 DIAGNOSIS — D500 Iron deficiency anemia secondary to blood loss (chronic): Secondary | ICD-10-CM | POA: Diagnosis not present

## 2021-04-26 DIAGNOSIS — I130 Hypertensive heart and chronic kidney disease with heart failure and stage 1 through stage 4 chronic kidney disease, or unspecified chronic kidney disease: Secondary | ICD-10-CM | POA: Diagnosis not present

## 2021-04-26 DIAGNOSIS — E1122 Type 2 diabetes mellitus with diabetic chronic kidney disease: Secondary | ICD-10-CM | POA: Diagnosis not present

## 2021-04-26 DIAGNOSIS — J9621 Acute and chronic respiratory failure with hypoxia: Secondary | ICD-10-CM | POA: Diagnosis not present

## 2021-04-26 NOTE — Progress Notes (Signed)
Remote pacemaker transmission.   

## 2021-04-28 DIAGNOSIS — I480 Paroxysmal atrial fibrillation: Secondary | ICD-10-CM | POA: Diagnosis not present

## 2021-04-28 DIAGNOSIS — I5023 Acute on chronic systolic (congestive) heart failure: Secondary | ICD-10-CM | POA: Diagnosis not present

## 2021-04-28 DIAGNOSIS — E875 Hyperkalemia: Secondary | ICD-10-CM | POA: Diagnosis not present

## 2021-04-28 DIAGNOSIS — J9621 Acute and chronic respiratory failure with hypoxia: Secondary | ICD-10-CM | POA: Diagnosis not present

## 2021-04-28 DIAGNOSIS — I130 Hypertensive heart and chronic kidney disease with heart failure and stage 1 through stage 4 chronic kidney disease, or unspecified chronic kidney disease: Secondary | ICD-10-CM | POA: Diagnosis not present

## 2021-04-28 DIAGNOSIS — D500 Iron deficiency anemia secondary to blood loss (chronic): Secondary | ICD-10-CM | POA: Diagnosis not present

## 2021-04-28 DIAGNOSIS — E7800 Pure hypercholesterolemia, unspecified: Secondary | ICD-10-CM | POA: Diagnosis not present

## 2021-04-28 DIAGNOSIS — E1122 Type 2 diabetes mellitus with diabetic chronic kidney disease: Secondary | ICD-10-CM | POA: Diagnosis not present

## 2021-04-28 DIAGNOSIS — N184 Chronic kidney disease, stage 4 (severe): Secondary | ICD-10-CM | POA: Diagnosis not present

## 2021-04-29 DIAGNOSIS — E1122 Type 2 diabetes mellitus with diabetic chronic kidney disease: Secondary | ICD-10-CM | POA: Diagnosis not present

## 2021-04-29 DIAGNOSIS — N184 Chronic kidney disease, stage 4 (severe): Secondary | ICD-10-CM | POA: Diagnosis not present

## 2021-04-29 DIAGNOSIS — I130 Hypertensive heart and chronic kidney disease with heart failure and stage 1 through stage 4 chronic kidney disease, or unspecified chronic kidney disease: Secondary | ICD-10-CM | POA: Diagnosis not present

## 2021-04-29 DIAGNOSIS — D509 Iron deficiency anemia, unspecified: Secondary | ICD-10-CM | POA: Diagnosis not present

## 2021-04-29 DIAGNOSIS — I509 Heart failure, unspecified: Secondary | ICD-10-CM | POA: Diagnosis not present

## 2021-04-29 DIAGNOSIS — I129 Hypertensive chronic kidney disease with stage 1 through stage 4 chronic kidney disease, or unspecified chronic kidney disease: Secondary | ICD-10-CM | POA: Diagnosis not present

## 2021-04-29 DIAGNOSIS — I5023 Acute on chronic systolic (congestive) heart failure: Secondary | ICD-10-CM | POA: Diagnosis not present

## 2021-05-03 ENCOUNTER — Other Ambulatory Visit: Payer: Self-pay | Admitting: Hematology and Oncology

## 2021-05-03 ENCOUNTER — Telehealth: Payer: Self-pay | Admitting: Hematology and Oncology

## 2021-05-03 DIAGNOSIS — I480 Paroxysmal atrial fibrillation: Secondary | ICD-10-CM | POA: Diagnosis not present

## 2021-05-03 DIAGNOSIS — I5023 Acute on chronic systolic (congestive) heart failure: Secondary | ICD-10-CM | POA: Diagnosis not present

## 2021-05-03 DIAGNOSIS — D509 Iron deficiency anemia, unspecified: Secondary | ICD-10-CM

## 2021-05-03 DIAGNOSIS — E1122 Type 2 diabetes mellitus with diabetic chronic kidney disease: Secondary | ICD-10-CM | POA: Diagnosis not present

## 2021-05-03 DIAGNOSIS — J9621 Acute and chronic respiratory failure with hypoxia: Secondary | ICD-10-CM | POA: Diagnosis not present

## 2021-05-03 DIAGNOSIS — E7800 Pure hypercholesterolemia, unspecified: Secondary | ICD-10-CM | POA: Diagnosis not present

## 2021-05-03 DIAGNOSIS — D500 Iron deficiency anemia secondary to blood loss (chronic): Secondary | ICD-10-CM | POA: Diagnosis not present

## 2021-05-03 DIAGNOSIS — N184 Chronic kidney disease, stage 4 (severe): Secondary | ICD-10-CM | POA: Diagnosis not present

## 2021-05-03 DIAGNOSIS — I130 Hypertensive heart and chronic kidney disease with heart failure and stage 1 through stage 4 chronic kidney disease, or unspecified chronic kidney disease: Secondary | ICD-10-CM | POA: Diagnosis not present

## 2021-05-03 DIAGNOSIS — E875 Hyperkalemia: Secondary | ICD-10-CM | POA: Diagnosis not present

## 2021-05-03 NOTE — Telephone Encounter (Signed)
Per [facility] - Patient notified of Scheduled 7/27 Labs 1:00 pm - Follow Up w/Kelli 1:30 pm

## 2021-05-04 ENCOUNTER — Inpatient Hospital Stay: Payer: Medicare HMO

## 2021-05-04 ENCOUNTER — Other Ambulatory Visit: Payer: Self-pay

## 2021-05-04 ENCOUNTER — Encounter: Payer: Self-pay | Admitting: Hematology and Oncology

## 2021-05-04 ENCOUNTER — Telehealth: Payer: Self-pay | Admitting: Hematology and Oncology

## 2021-05-04 ENCOUNTER — Inpatient Hospital Stay (INDEPENDENT_AMBULATORY_CARE_PROVIDER_SITE_OTHER): Payer: Medicare HMO | Admitting: Hematology and Oncology

## 2021-05-04 VITALS — BP 147/65 | HR 81 | Temp 98.1°F | Resp 14 | Ht 68.0 in | Wt 160.0 lb

## 2021-05-04 DIAGNOSIS — D649 Anemia, unspecified: Secondary | ICD-10-CM | POA: Diagnosis not present

## 2021-05-04 DIAGNOSIS — Z7901 Long term (current) use of anticoagulants: Secondary | ICD-10-CM | POA: Diagnosis not present

## 2021-05-04 DIAGNOSIS — D508 Other iron deficiency anemias: Secondary | ICD-10-CM | POA: Diagnosis not present

## 2021-05-04 DIAGNOSIS — D509 Iron deficiency anemia, unspecified: Secondary | ICD-10-CM | POA: Diagnosis not present

## 2021-05-04 DIAGNOSIS — Z79899 Other long term (current) drug therapy: Secondary | ICD-10-CM | POA: Diagnosis not present

## 2021-05-04 DIAGNOSIS — K922 Gastrointestinal hemorrhage, unspecified: Secondary | ICD-10-CM | POA: Diagnosis not present

## 2021-05-04 LAB — CBC AND DIFFERENTIAL
HCT: 21 — AB (ref 36–46)
Hemoglobin: 6.7 — AB (ref 12.0–16.0)
Neutrophils Absolute: 3.9
Platelets: 366 (ref 150–399)
WBC: 5

## 2021-05-04 LAB — CBC: RBC: 2.3 — AB (ref 3.87–5.11)

## 2021-05-04 LAB — PREPARE RBC (CROSSMATCH)

## 2021-05-04 NOTE — Progress Notes (Signed)
[place]  [address] [place],  [place]  [address] [phone]  Clinic Day:  05/04/2021  Referring physician: [name], MD   CHIEF COMPLAINT:  CC:  Iron deficiency anemia  Current Treatment:   IV iron as needed   HISTORY OF PRESENT ILLNESS:  [name] is a 87 y.o. female with a history of anemia secondary to both iron deficiency and renal insufficiency.  IV Feraheme was last given in April 2022 to replenish her iron stores and improve her hemoglobin. She was due for follow-up in our office 2 weeks ago, but did not keep that appointment.    INTERVAL HISTORY:  [name] is added to the schedule today as her hemoglobin was found to be 6.7 at her Nephrology office on July 22nd. White blood cells and platelets were normal.  Iron studies revealed a serum iron 26, % iron saturation 7%, TIBC 384 and ferritin 49. The patient desired to be set up for IV iron at our office, so was not transfused. She is symptomatic with fatigue and dyspnea with exertion.  She denies chest pain.  Since her last visit, she saw Dr. Lyda Jester in May, at which time her hemoglobin was 9.5.  He had her resume pantoprazole.  She was hospitalized with an acute exacerbation of CHF in early July.  Her hemoglobin on discharge was 9.6.  She is followed closely by Nephrology and Cardiology. She denies fevers or chills. She denies pain. Her appetite is good. Her weight has increased 11 pounds over last 3 months . She continues rivaroxaban 15 mg daily.  She is also on furosemide 40 mg daily.  REVIEW OF SYSTEMS:  Review of Systems  Constitutional:  Positive for fatigue. Negative for appetite change, chills, fever and unexpected weight change.  HENT:   Negative for lump/mass, mouth sores and sore throat.   Respiratory:  Positive for shortness of breath. Negative for cough.   Cardiovascular:  Negative for chest pain and leg swelling.  Gastrointestinal:  Negative for  abdominal pain, constipation, diarrhea, nausea and vomiting.  Endocrine: Negative for hot flashes.  Genitourinary:  Negative for difficulty urinating, dysuria, frequency and hematuria.   Musculoskeletal:  Negative for arthralgias, back pain and myalgias.  Skin:  Negative for rash.  Neurological:  Negative for dizziness and headaches.  Hematological:  Negative for adenopathy. Does not bruise/bleed easily.  Psychiatric/Behavioral:  Negative for depression and sleep disturbance. The patient is not nervous/anxious.     VITALS:  Blood pressure (!) 147/65, pulse 81, temperature 98.1 F (36.7 C), resp. rate 14, height 5\' 8"  (1.727 m), weight 160 lb (72.6 kg), SpO2 96 %.  Wt Readings from Last 3 Encounters:  05/04/21 160 lb (72.6 kg)  03/13/21 149 lb 3.2 oz (67.7 kg)  02/01/21 149 lb (67.6 kg)    Body mass index is 24.33 kg/m.  Performance status (ECOG): 3 - Symptomatic, >50% confined to bed  PHYSICAL EXAM:  Physical Exam Vitals and nursing note reviewed.  Constitutional:      General: She is not in acute distress.    Appearance: Normal appearance.  HENT:     Head: Normocephalic and atraumatic.     Mouth/Throat:     Mouth: Mucous membranes are moist.     Pharynx: Oropharynx is clear. No oropharyngeal exudate or posterior oropharyngeal erythema.  Eyes:     General: No scleral icterus.    Extraocular Movements: Extraocular movements intact.     Conjunctiva/sclera: Conjunctivae normal.  Pupils: Pupils are equal, round, and reactive to light.  Cardiovascular:     Rate and Rhythm: Normal rate and regular rhythm.     Heart sounds: Normal heart sounds. No murmur heard.   No friction rub. No gallop.  Pulmonary:     Effort: Pulmonary effort is normal.     Breath sounds: Normal breath sounds. No wheezing, rhonchi or rales.  Chest:  Breasts:    Right: No axillary adenopathy or supraclavicular adenopathy.     Left: No axillary adenopathy or supraclavicular adenopathy.  Abdominal:      General: There is no distension.     Palpations: Abdomen is soft. There is no hepatomegaly, splenomegaly or mass.     Tenderness: There is no abdominal tenderness.  Musculoskeletal:        General: Normal range of motion.     Cervical back: Normal range of motion and neck supple. No tenderness.     Right lower leg: No edema.     Left lower leg: No edema.  Lymphadenopathy:     Cervical: No cervical adenopathy.     Upper Body:     Right upper body: No supraclavicular or axillary adenopathy.     Left upper body: No supraclavicular or axillary adenopathy.     Lower Body: No right inguinal adenopathy. No left inguinal adenopathy.  Skin:    General: Skin is warm and dry.     Coloration: Skin is not jaundiced.     Findings: No rash.  Neurological:     Mental Status: She is alert and oriented to person, place, and time.     Cranial Nerves: No cranial nerve deficit.  Psychiatric:        Mood and Affect: Mood normal.        Behavior: Behavior normal.        Thought Content: Thought content normal.    LABS:   CBC Latest Ref Rng & Units 05/04/2021 03/01/2021 01/21/2021  WBC - 5.0 3.9 4.5  Hemoglobin 12.0 - 16.0 6.7(A) 9.2(L) 8.3(A)  Hematocrit 36 - 46 21(A) 29.5(L) 27(A)  Platelets 150 - 399 366 294 422(A)   CMP Latest Ref Rng & Units 03/01/2021 01/21/2021 01/13/2021  Glucose 65 - 99 mg/dL 89 - 84  BUN 8 - 27 mg/dL 87(HH) 82(A) 60(H)  Creatinine 0.57 - 1.00 mg/dL 2.43(H) 2.2(A) 1.94(H)  Sodium 134 - 144 mmol/L 134 132(A) 138  Potassium 3.5 - 5.2 mmol/L 4.7 4.8 5.1  Chloride 96 - 106 mmol/L 96 95(A) 98  CO2 20 - 29 mmol/L 20 29(A) 24  Calcium 8.7 - 10.3 mg/dL 9.7 10.4 11.2(H)  Total Protein 6.0 - 8.5 g/dL 6.1 - 6.1  Total Bilirubin 0.0 - 1.2 mg/dL 0.5 - 0.3  Alkaline Phos 44 - 121 IU/L 39(L) 44 39(L)  AST 0 - 40 IU/L 26 24 18   ALT 0 - 32 IU/L 15 12 9      No results found for: CEA1 / No results found for: CEA1 No results found for: PSA1 No results found for: GUY403 No results found  for: CAN125  No results found for: TOTALPROTELP, ALBUMINELP, A1GS, A2GS, BETS, BETA2SER, GAMS, MSPIKE, SPEI Lab Results  Component Value Date   TIBC 657 (H) 01/21/2021   FERRITIN 13 01/21/2021   IRONPCTSAT 5 (L) 01/21/2021   No results found for: LDH  STUDIES:  CUP PACEART REMOTE DEVICE CHECK  Result Date: 04/05/2021 Scheduled remote reviewed. Normal device function.  1 NSVT (atrial), 7 beats Next remote 91 days-  JBox, RN/CVRS     HISTORY:   Past Medical History:  Diagnosis Date   Amiodarone pulmonary toxicity    Anemia    no GI bleeding; on iron   Arthritis    Atrial fibrillation (HCC)    Biventricular ICD (implantable cardiac defibrillator) in place    BiV ICD for nonischemic cardiomyopathy; BiV responder   Carotid artery occlusion    CHF (congestive heart failure) (HCC)    Chronic a-fib (HCC)    on xarelto; failed DCCV   Chronic kidney disease, stage 4 (severe) (HCC)    Diabetes (HCC)    Fibromyalgia    Full dentures    GAD (generalized anxiety disorder)    GERD (gastroesophageal reflux disease)    H/O cardiovascular stress test 08/04/2010   normal imaging; EF 61%   H/O echocardiogram 07/05/2010   EF 45-50%; aortic valve-mildly sclerotic; LA-mod dilaterd    Hyperlipemia    Hypertension    Hypertensive heart disease with heart failure (HCC)    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 renal dopp (12/18/08)- Right RA-<60%; L RA stent- open; nl size and shape in both kidneys;  b.  Rena Artery [name] (3/16):  SMA with > 70% stenosis, Bilateral prox RA with 1-59%   Renal insufficiency, mild     05/01/12 Creat 1.47   Restless leg syndrome    Type 2 diabetes mellitus with diabetic neuropathy (HCC)    VT (ventricular tachycardia) ([place]) 9/11   polymorphic VT probably related to sotalol therapy   Wears glasses     Past Surgical History:  Procedure Laterality Date   ABDOMINAL HYSTERECTOMY     APPENDECTOMY     AV NODE ABLATION  06/08/2006   performed by Dr. Beckie Salts; due to Afib with RVR and tachy brady syndrome   BIV ICD GENERATOR CHANGEOUT N/A 12/31/2017   Procedure: [place];  Surgeon: Evans Lance, MD;  Location: [facility];  Service: Cardiovascular;  Laterality: N/A;   BIV ICD GENERTAOR CHANGE OUT  11/15/10   Medtronic Protecta [id number] serial #[id number]   BiV ICD Placement  06/17/2007; 04/20/14   Medtronic Concerta [id number]; RA lead-Med 5076/53cm, [id number]; RV lead- SJM 7121/65cm, [id number]; LV lead- Med 4194/88cm, [id number]; gen change 04/2014 by Dr Lovena Le   BIV PACEMAKER GENERATOR CHANGE OUT N/A 04/20/2014   Procedure: BIV PACEMAKER GENERATOR CHANGE OUT;  Surgeon: Evans Lance, MD;  Location: [facility] CATH LAB;  Service: Cardiovascular;  Laterality: N/A;   BRAIN MENINGIOMA EXCISION  2011   L frontal meningioma at [place]     lumpectomy right   CARDIOVERSION  07/21/05   converted to sinus brady   CAROTID ENDARTERECTOMY Right 09/24/2018   with bovine pericardial patch angioplasty   CATARACT EXTRACTION W/ INTRAOCULAR LENS  IMPLANT, BILATERAL     CHOLECYSTECTOMY     Dr. Sherald Hess in [place] and Dr. Lyda Jester follows; surgical stricture post procedure with stent placement   COLONOSCOPY W/ BIOPSIES AND POLYPECTOMY     DILATION AND CURETTAGE  OF UTERUS     ENDARTERECTOMY Right 09/24/2018   Procedure: ENDARTERECTOMY CAROTID RIGHT;  Surgeon: Angelia Mould, MD;  Location: [facility] OR;  Service: Vascular;  Laterality: Right;   PV Angio  1994   L Renal artery stent     Family History  Problem Relation Age of Onset   Heart failure  Mother    Hypertension Mother    Cancer Father        lung   Dementia Sister    Cancer Sister        pancreatic, renal and thyroid   Heart attack Brother    AAA (abdominal aortic aneurysm) Brother    Stroke Maternal Grandmother    Diabetes Other    Stroke Son     Social History:  reports that she has never smoked. She has never used smokeless tobacco. She reports that she does not drink alcohol and does not use drugs.The patient is accompanied by her daughter today.  Allergies:  Allergies  Allergen Reactions   Gabapentin Swelling   Pregabalin Swelling        Morphine Nausea Only   Penicillins Rash and Other (See Comments)    Has patient had a PCN reaction causing immediate rash, facial/tongue/throat swelling, SOB or lightheadedness with hypotension: No Has patient had a PCN reaction causing severe rash involving mucus membranes or skin necrosis: No Has patient had a PCN reaction that required hospitalization: No - MD office Has patient had a PCN reaction occurring within the last 10 years: No If all of the above answers are "NO", then may proceed with Cephalosporin use.    Tape Rash   Ace Inhibitors Other (See Comments)    Angioedema (ALLERGY/intolerance)   Carvedilol Other (See Comments)    Myalgias (intolerance)   Insulin Glargine Itching   Metoprolol Other (See Comments)    Angioedema (ALLERGY/intolerance)   Pantoprazole Nausea Only   Procaine Other (See Comments)    unknown   Statins Other (See Comments)    Myalgias (intolerance)   Lisinopril Rash         Current Medications: Current Outpatient Medications  Medication Sig Dispense Refill   Accu-Chek FastClix Lancets MISC TEST BLOOD SUGAR TWICE DAILY E11.69 204 each 3   ACCU-CHEK SMARTVIEW test strip CHECK BLOOD SUGAR TWICE DAILY 200 strip 3   Alcohol Swabs (B-D SINGLE USE SWABS REGULAR) PADS USE TWICE DAILY  E11.69 200 each 3   allopurinol (ZYLOPRIM) 100 MG tablet      ALPRAZolam (XANAX) 0.5 MG tablet TAKE  ONE TABLET BY MOUTH EVERY NIGHT AT BEDTIME FOR INSOMNIA 30 tablet 2   amLODipine (NORVASC) 5 MG tablet TAKE 1 TABLET EVERY DAY 90 tablet 1   bumetanide (BUMEX) 2 MG tablet Take 2 mg by mouth 2 (two) times daily.     calcium carbonate (OSCAL) 1500 (600 Ca) MG TABS tablet Take 600 mg of elemental calcium by mouth 2 (two) times daily with a meal.     digoxin (LANOXIN) 0.125 MG tablet TAKE 1/2 TABLET EVERY DAY (SUBSTITUTED FOR  DIGITEK) 45 tablet 1   DULoxetine (CYMBALTA) 20 MG capsule TAKE 1 CAPSULE BY MOUTH EVERYDAY AT BEDTIME 90 capsule 1   ezetimibe (ZETIA) 10 MG tablet TAKE 1 TABLET EVERY DAY 90 tablet 1   fenofibrate micronized (LOFIBRA) 134 MG capsule TAKE 1 CAPSULE (134 MG TOTAL) BY MOUTH DAILY. 90 capsule 1   fluticasone (FLONASE) 50 MCG/ACT nasal spray USE 2 SPRAYS IN EACH NOSTRIL EVERY DAY AS NEEDED 48 g  2   levothyroxine (SYNTHROID) 50 MCG tablet Take 1 tablet (50 mcg total) by mouth daily before breakfast. 90 tablet 0   losartan (COZAAR) 100 MG tablet TAKE 1 TABLET EVERY DAY 90 tablet 1   Multiple Vitamin (MULTIVITAMIN WITH MINERALS) TABS tablet Take 1 tablet by mouth daily. Centrum Silver     Naphazoline-Pheniramine (OPCON-A) 0.027-0.315 % SOLN Apply 1 drop to eye in the morning and at bedtime.     nystatin cream (MYCOSTATIN) Apply 1 application topically 3 (three) times daily. Cream to back rash     olopatadine (PATANOL) 0.1 % ophthalmic solution Place 1 drop into both eyes 2 (two) times daily.     omega-3 acid ethyl esters (LOVAZA) 1 g capsule TAKE 1 CAPSULE TWICE DAILY 180 capsule 1   oxyCODONE-acetaminophen (PERCOCET) 7.5-325 MG tablet Take 1 tablet by mouth every 6 (six) hours as needed (back pain.). 120 tablet 0   pantoprazole (PROTONIX) 40 MG tablet      polyethylene glycol (MIRALAX / GLYCOLAX) 17 g packet Take 17 g by mouth daily as needed.      pravastatin (PRAVACHOL) 10 MG tablet TAKE 1 TABLET FOUR TIMES WEEKLY 48 tablet 1   rOPINIRole (REQUIP) 1 MG tablet TAKE 1 TABLET AT  BEDTIME 90 tablet 2   senna (SENOKOT) 8.6 MG TABS tablet Take 2 tablets by mouth daily.     spironolactone (ALDACTONE) 25 MG tablet TAKE 1/2 TABLET EVERY DAY 45 tablet 1   tiZANidine (ZANAFLEX) 4 MG tablet TAKE 1 TABLET AT BEDTIME 90 tablet 0   XARELTO 15 MG TABS tablet TAKE 1 TABLET DAILY WITH SUPPER. 90 tablet 3   No current facility-administered medications for this visit.     ASSESSMENT & PLAN:   Assessment/Plan:  [name] is a 87 y.o. female with iron deficiency anemia felt to be due to chronic GI blood loss.  She once again has severe anemia, so I recommended she be transfused 2 units of packed red blood cells as an outpatient tomorrow.  I will plan to give her furosemide in between the units to avoid fluid overload. I will arrange for her to have IV iron infusions again at this time. I will plan to repeat a CBC with each IV iron infusion.  I will then plan to see her back in 1 month for repeat clinical assessment. The patient understands the plans discussed today and is in agreement with them.  She knows to contact our office if she develops concerns prior to her next appointment.      Marvia Pickles, PA-C

## 2021-05-04 NOTE — Telephone Encounter (Signed)
Per 7/27 LOS, patient scheduled for Sept Appt's.  Gave patient Appt Summary  Will scheduled patient's Feraheme once it is PreAuthorized

## 2021-05-05 ENCOUNTER — Other Ambulatory Visit: Payer: Self-pay | Admitting: Pharmacist

## 2021-05-05 ENCOUNTER — Inpatient Hospital Stay (INDEPENDENT_AMBULATORY_CARE_PROVIDER_SITE_OTHER): Payer: Medicare HMO

## 2021-05-05 DIAGNOSIS — D509 Iron deficiency anemia, unspecified: Secondary | ICD-10-CM | POA: Diagnosis not present

## 2021-05-05 DIAGNOSIS — D508 Other iron deficiency anemias: Secondary | ICD-10-CM

## 2021-05-05 DIAGNOSIS — K922 Gastrointestinal hemorrhage, unspecified: Secondary | ICD-10-CM | POA: Diagnosis not present

## 2021-05-05 DIAGNOSIS — Z7901 Long term (current) use of anticoagulants: Secondary | ICD-10-CM | POA: Diagnosis not present

## 2021-05-05 DIAGNOSIS — Z79899 Other long term (current) drug therapy: Secondary | ICD-10-CM | POA: Diagnosis not present

## 2021-05-05 MED ORDER — DIPHENHYDRAMINE HCL 25 MG PO CAPS
ORAL_CAPSULE | ORAL | Status: AC
  Filled 2021-05-05: qty 1

## 2021-05-05 MED ORDER — SODIUM CHLORIDE 0.9% IV SOLUTION
250.0000 mL | Freq: Once | INTRAVENOUS | Status: AC
  Administered 2021-05-05: 250 mL via INTRAVENOUS
  Filled 2021-05-05: qty 250

## 2021-05-05 MED ORDER — ACETAMINOPHEN 325 MG PO TABS
650.0000 mg | ORAL_TABLET | Freq: Once | ORAL | Status: AC
  Administered 2021-05-05: 650 mg via ORAL

## 2021-05-05 MED ORDER — FUROSEMIDE 10 MG/ML IJ SOLN: 20.0000 mg | Freq: Once | INTRAMUSCULAR | Status: DC

## 2021-05-05 MED ORDER — ACETAMINOPHEN 325 MG PO TABS
ORAL_TABLET | ORAL | Status: AC
  Filled 2021-05-05: qty 2

## 2021-05-05 MED ORDER — DIPHENHYDRAMINE HCL 25 MG PO CAPS
25.0000 mg | ORAL_CAPSULE | Freq: Once | ORAL | Status: AC
  Administered 2021-05-05: 25 mg via ORAL

## 2021-05-05 NOTE — Patient Instructions (Signed)
https://www.redcrossblood.org/donate-blood/blood-donation-process/what-happens-to-donated-blood/blood-transfusions/types-of-blood-transfusions.html"> https://www.hematology.org/education/patients/blood-basics/blood-safety-and-matching"> https://www.nhlbi.nih.gov/health-topics/blood-transfusion">  Blood Transfusion, Adult A blood transfusion is a procedure in which you receive blood or a type of blood cell (blood component) through an IV. You may need a blood transfusion when your blood level is low. This may result from a bleeding disorder, illness, injury, or surgery. The blood may come from a donor. You may also be able to donate blood for yourself (autologous blood donation) before a planned surgery. The blood given in a transfusion is made up of different blood components. You may receive: Red blood cells. These carry oxygen to the cells in the body. Platelets. These help your blood to clot. Plasma. This is the liquid part of your blood. It carries proteins and other substances throughout the body. White blood cells. These help you fight infections. If you have hemophilia or another clotting disorder, you may also receive othertypes of blood products. Tell a health care provider about: Any blood disorders you have. Any previous reactions you have had during a blood transfusion. Any allergies you have. All medicines you are taking, including vitamins, herbs, eye drops, creams, and over-the-counter medicines. Any surgeries you have had. Any medical conditions you have, including any recent fever or cold symptoms. Whether you are pregnant or may be pregnant. What are the risks? Generally, this is a safe procedure. However, problems may occur. The most common problems include: A mild allergic reaction, such as red, swollen areas of skin (hives) and itching. Fever or chills. This may be the body's response to new blood cells received. This may occur during or up to 4 hours after the  transfusion. More serious problems may include: Transfusion-associated circulatory overload (TACO), or too much fluid in the lungs. This may cause breathing problems. A serious allergic reaction, such as difficulty breathing or swelling around the face and lips. Transfusion-related acute lung injury (TRALI), which causes breathing difficulty and low oxygen in the blood. This can occur within hours of the transfusion or several days later. Iron overload. This can happen after receiving many blood transfusions over a period of time. Infection or virus being transmitted. This is rare because donated blood is carefully tested before it is given. Hemolytic transfusion reaction. This is rare. It happens when your body's defense system (immune system)tries to attack the new blood cells. Symptoms may include fever, chills, nausea, low blood pressure, and low back or chest pain. Transfusion-associated graft-versus-host disease (TAGVHD). This is rare. It happens when donated cells attack your body's healthy tissues. What happens before the procedure? Medicines Ask your health care provider about: Changing or stopping your regular medicines. This is especially important if you are taking diabetes medicines or blood thinners. Taking medicines such as aspirin and ibuprofen. These medicines can thin your blood. Do not take these medicines unless your health care provider tells you to take them. Taking over-the-counter medicines, vitamins, herbs, and supplements. General instructions Follow instructions from your health care provider about eating and drinking restrictions. You will have a blood test to determine your blood type. This is necessary to know what kind of blood your body will accept and to match it to the donor blood. If you are going to have a planned surgery, you may be able to do an autologous blood donation. This may be done in case you need to have a transfusion. You will have your temperature,  blood pressure, and pulse monitored before the transfusion. If you have had an allergic reaction to a transfusion in the past, you may be given   medicine to help prevent a reaction. This medicine may be given to you by mouth (orally) or through an IV. Set aside time for the blood transfusion. This procedure generally takes 1-4 hours to complete. What happens during the procedure?  An IV will be inserted into one of your veins. The bag of donated blood will be attached to your IV. The blood will then enter through your vein. Your temperature, blood pressure, and pulse will be monitored regularly during the transfusion. This monitoring is done to detect early signs of a transfusion reaction. Tell your nurse right away if you have any of these symptoms during the transfusion: Shortness of breath or trouble breathing. Chest or back pain. Fever or chills. Hives or itching. If you have any signs or symptoms of a reaction, your transfusion will be stopped and you may be given medicine. When the transfusion is complete, your IV will be removed. Pressure may be applied to the IV site for a few minutes. A bandage (dressing)will be applied. The procedure may vary among health care providers and hospitals. What happens after the procedure? Your temperature, blood pressure, pulse, breathing rate, and blood oxygen level will be monitored until you leave the hospital or clinic. Your blood may be tested to see how you are responding to the transfusion. You may be warmed with fluids or blankets to maintain a normal body temperature. If you receive your blood transfusion in an outpatient setting, you will be told whom to contact to report any reactions. Where to find more information For more information on blood transfusions, visit the American Red Cross: redcross.org Summary A blood transfusion is a procedure in which you receive blood or a type of blood cell (blood component) through an IV. The blood you  receive may come from a donor or be donated by yourself (autologous blood donation) before a planned surgery. The blood given in a transfusion is made up of different blood components. You may receive red blood cells, platelets, plasma, or white blood cells depending on the condition treated. Your temperature, blood pressure, and pulse will be monitored before, during, and after the transfusion. After the transfusion, your blood may be tested to see how your body has responded. This information is not intended to replace advice given to you by your health care provider. Make sure you discuss any questions you have with your healthcare provider. Document Revised: 07/31/2019 Document Reviewed: 03/20/2019 Elsevier Patient Education  2022 Elsevier Inc.  

## 2021-05-05 NOTE — Progress Notes (Signed)
Spoke with Murray Hodgkins, NP about patients bp and she decided not to give the lasix in between her units of blood. Marlon Pel aware also

## 2021-05-06 ENCOUNTER — Encounter: Payer: Self-pay | Admitting: Hematology and Oncology

## 2021-05-06 DIAGNOSIS — D500 Iron deficiency anemia secondary to blood loss (chronic): Secondary | ICD-10-CM | POA: Diagnosis not present

## 2021-05-06 DIAGNOSIS — I480 Paroxysmal atrial fibrillation: Secondary | ICD-10-CM | POA: Diagnosis not present

## 2021-05-06 DIAGNOSIS — E875 Hyperkalemia: Secondary | ICD-10-CM | POA: Diagnosis not present

## 2021-05-06 DIAGNOSIS — N184 Chronic kidney disease, stage 4 (severe): Secondary | ICD-10-CM | POA: Diagnosis not present

## 2021-05-06 DIAGNOSIS — I5023 Acute on chronic systolic (congestive) heart failure: Secondary | ICD-10-CM | POA: Diagnosis not present

## 2021-05-06 DIAGNOSIS — I130 Hypertensive heart and chronic kidney disease with heart failure and stage 1 through stage 4 chronic kidney disease, or unspecified chronic kidney disease: Secondary | ICD-10-CM | POA: Diagnosis not present

## 2021-05-06 DIAGNOSIS — E1122 Type 2 diabetes mellitus with diabetic chronic kidney disease: Secondary | ICD-10-CM | POA: Diagnosis not present

## 2021-05-06 DIAGNOSIS — J9621 Acute and chronic respiratory failure with hypoxia: Secondary | ICD-10-CM | POA: Diagnosis not present

## 2021-05-06 DIAGNOSIS — E7800 Pure hypercholesterolemia, unspecified: Secondary | ICD-10-CM | POA: Diagnosis not present

## 2021-05-06 LAB — TYPE AND SCREEN
ABO/RH(D): A POS
Antibody Screen: NEGATIVE
Unit division: 0
Unit division: 0

## 2021-05-06 LAB — BPAM RBC
Blood Product Expiration Date: 202208302359
Blood Product Expiration Date: 202208302359
ISSUE DATE / TIME: 202207280730
ISSUE DATE / TIME: 202207280730
Unit Type and Rh: 6200
Unit Type and Rh: 6200

## 2021-05-10 DIAGNOSIS — D500 Iron deficiency anemia secondary to blood loss (chronic): Secondary | ICD-10-CM | POA: Diagnosis not present

## 2021-05-10 DIAGNOSIS — I130 Hypertensive heart and chronic kidney disease with heart failure and stage 1 through stage 4 chronic kidney disease, or unspecified chronic kidney disease: Secondary | ICD-10-CM | POA: Diagnosis not present

## 2021-05-10 DIAGNOSIS — E7800 Pure hypercholesterolemia, unspecified: Secondary | ICD-10-CM | POA: Diagnosis not present

## 2021-05-10 DIAGNOSIS — J9621 Acute and chronic respiratory failure with hypoxia: Secondary | ICD-10-CM | POA: Diagnosis not present

## 2021-05-10 DIAGNOSIS — E1122 Type 2 diabetes mellitus with diabetic chronic kidney disease: Secondary | ICD-10-CM | POA: Diagnosis not present

## 2021-05-10 DIAGNOSIS — I5023 Acute on chronic systolic (congestive) heart failure: Secondary | ICD-10-CM | POA: Diagnosis not present

## 2021-05-10 DIAGNOSIS — E875 Hyperkalemia: Secondary | ICD-10-CM | POA: Diagnosis not present

## 2021-05-10 DIAGNOSIS — N184 Chronic kidney disease, stage 4 (severe): Secondary | ICD-10-CM | POA: Diagnosis not present

## 2021-05-10 DIAGNOSIS — I480 Paroxysmal atrial fibrillation: Secondary | ICD-10-CM | POA: Diagnosis not present

## 2021-05-12 DIAGNOSIS — E7800 Pure hypercholesterolemia, unspecified: Secondary | ICD-10-CM | POA: Diagnosis not present

## 2021-05-12 DIAGNOSIS — E875 Hyperkalemia: Secondary | ICD-10-CM | POA: Diagnosis not present

## 2021-05-12 DIAGNOSIS — I5023 Acute on chronic systolic (congestive) heart failure: Secondary | ICD-10-CM | POA: Diagnosis not present

## 2021-05-12 DIAGNOSIS — J9621 Acute and chronic respiratory failure with hypoxia: Secondary | ICD-10-CM | POA: Diagnosis not present

## 2021-05-12 DIAGNOSIS — I480 Paroxysmal atrial fibrillation: Secondary | ICD-10-CM | POA: Diagnosis not present

## 2021-05-12 DIAGNOSIS — I130 Hypertensive heart and chronic kidney disease with heart failure and stage 1 through stage 4 chronic kidney disease, or unspecified chronic kidney disease: Secondary | ICD-10-CM | POA: Diagnosis not present

## 2021-05-12 DIAGNOSIS — N184 Chronic kidney disease, stage 4 (severe): Secondary | ICD-10-CM | POA: Diagnosis not present

## 2021-05-12 DIAGNOSIS — D500 Iron deficiency anemia secondary to blood loss (chronic): Secondary | ICD-10-CM | POA: Diagnosis not present

## 2021-05-12 DIAGNOSIS — E1122 Type 2 diabetes mellitus with diabetic chronic kidney disease: Secondary | ICD-10-CM | POA: Diagnosis not present

## 2021-05-15 DIAGNOSIS — N179 Acute kidney failure, unspecified: Secondary | ICD-10-CM | POA: Diagnosis not present

## 2021-05-15 DIAGNOSIS — I5022 Chronic systolic (congestive) heart failure: Secondary | ICD-10-CM | POA: Diagnosis not present

## 2021-05-15 DIAGNOSIS — N184 Chronic kidney disease, stage 4 (severe): Secondary | ICD-10-CM | POA: Diagnosis not present

## 2021-05-15 DIAGNOSIS — E039 Hypothyroidism, unspecified: Secondary | ICD-10-CM | POA: Diagnosis not present

## 2021-05-15 DIAGNOSIS — I517 Cardiomegaly: Secondary | ICD-10-CM | POA: Diagnosis not present

## 2021-05-15 DIAGNOSIS — Z789 Other specified health status: Secondary | ICD-10-CM | POA: Diagnosis not present

## 2021-05-15 DIAGNOSIS — D631 Anemia in chronic kidney disease: Secondary | ICD-10-CM | POA: Diagnosis not present

## 2021-05-15 DIAGNOSIS — I130 Hypertensive heart and chronic kidney disease with heart failure and stage 1 through stage 4 chronic kidney disease, or unspecified chronic kidney disease: Secondary | ICD-10-CM | POA: Diagnosis not present

## 2021-05-15 DIAGNOSIS — R0602 Shortness of breath: Secondary | ICD-10-CM | POA: Diagnosis not present

## 2021-05-15 DIAGNOSIS — E785 Hyperlipidemia, unspecified: Secondary | ICD-10-CM | POA: Diagnosis not present

## 2021-05-15 DIAGNOSIS — E875 Hyperkalemia: Secondary | ICD-10-CM | POA: Diagnosis not present

## 2021-05-17 ENCOUNTER — Other Ambulatory Visit: Payer: Self-pay | Admitting: Cardiology

## 2021-05-17 DIAGNOSIS — I420 Dilated cardiomyopathy: Secondary | ICD-10-CM

## 2021-05-19 DIAGNOSIS — I5023 Acute on chronic systolic (congestive) heart failure: Secondary | ICD-10-CM | POA: Diagnosis not present

## 2021-05-19 DIAGNOSIS — E039 Hypothyroidism, unspecified: Secondary | ICD-10-CM | POA: Diagnosis not present

## 2021-05-19 DIAGNOSIS — E7800 Pure hypercholesterolemia, unspecified: Secondary | ICD-10-CM | POA: Diagnosis not present

## 2021-05-19 DIAGNOSIS — E1122 Type 2 diabetes mellitus with diabetic chronic kidney disease: Secondary | ICD-10-CM | POA: Diagnosis not present

## 2021-05-19 DIAGNOSIS — J9621 Acute and chronic respiratory failure with hypoxia: Secondary | ICD-10-CM | POA: Diagnosis not present

## 2021-05-19 DIAGNOSIS — F32A Depression, unspecified: Secondary | ICD-10-CM | POA: Diagnosis not present

## 2021-05-19 DIAGNOSIS — I130 Hypertensive heart and chronic kidney disease with heart failure and stage 1 through stage 4 chronic kidney disease, or unspecified chronic kidney disease: Secondary | ICD-10-CM | POA: Diagnosis not present

## 2021-05-19 DIAGNOSIS — N184 Chronic kidney disease, stage 4 (severe): Secondary | ICD-10-CM | POA: Diagnosis not present

## 2021-05-19 DIAGNOSIS — E875 Hyperkalemia: Secondary | ICD-10-CM | POA: Diagnosis not present

## 2021-05-19 NOTE — Progress Notes (Addendum)
Chronic Care Management Pharmacy Note  05/19/2021 Name:  [name] MRN:  [id number] DOB:  1933-11-10   Plan Recommendations:  Discussed follow-up visit with cardiology - Patient states her daughter set one up in the next week or 2.  Follow-up with Dr. Tobie Poet after recent hospitalization - encouraged patient to schedule appointment. Patient reports that her children are helping her coordinate appointments.  Patient is weighing every day. States it was up some yesterday. Normal weight 145-150 lbs and yesterday it was 150 so was up several pounds. Bumetanide dose was previously 2 mg bid but patient states she is only taking 1 mg once daily since discharge. Pharmacist educating patient on hospital discharge medication list. Advised patient to monitor weight and fluid closely. Discussed parameters of when to call doctor. Patient reports that home health is helping her manage this.  Blood sugar is: 95, 90,110. Blood pressure 130/70 per patient report.  Meals are coming from her daughter mostly. She is not consuming salt.  Patient's family is assisting with filling her pill box since hospitalizations. Medication is mostly free from [facility].   Subjective: [name] [name] is an 87 y.o. year old female who is a primary patient of Cox, Kirsten, MD.  The CCM team was consulted for assistance with disease management and care coordination needs.    Engaged with patient by telephone for follow up visit in response to provider referral for pharmacy case management and/or care coordination services.   Consent to Services:  The patient was given information about Chronic Care Management services, agreed to services, and gave verbal consent prior to initiation of services.  Please see initial visit note for detailed documentation.   Patient Care Team: [name], MD as PCP - General (Family Medicine) Evans Lance, MD as Consulting Physician (Cardiology) Sharyn Dross., DPM  (Podiatry) Burnice Logan, [facility] as Pharmacist (Pharmacist) Berniece Salines, DO as Consulting Physician (Cardiology)  Recent office visits: 03/02/21-Labs ordered,  She has not had any blood in her stools that she is aware.  She will pick-up stool cards.  Anemia improved.  Kidney function worsened. Hold spironolactone. Recommend increase water. Repeat CMP in 2 weeks. Please ask if pt has seen nephrology.If so, please send labs and tell her I would like her to follow up within the next week. If no nephrologist, pt needs one. Start referral. Liver function normal. Lipids well controlled. Vitamin D normal   02/22/21-Dr. Cox PCP, HTN with CHF, Knee pain- fell on Saturday has had increased pain since fall, Use debrox drops to rt ear as directed.  Return in one week for ear washing and fasting lab work. 12/09/2020 - continue bumetanide 1 mg bid.   Recent consult visits: 05/04/2021 - oncology for anemia - order 2 units of blood. Furosemide given between the units to avoid fluid overload. IV iron infusions.  12/10/2020-12/21/2020 - hospital admission and follow-up with Dr. Tobie Poet 1 week post SNF discharge. Follow-up renal ultrasound to evaluate left renal mass. Xarelto continued s/p PE.   Hospital visits: 05/15/2021 - ED visit - shortness of breath and weakness. Elevated potassium. Spironolactone held. Recommend considering discontinuation due to kidney disease.  04/13/2021 - ED visit - shortness of breath. IV diuresis given.  12/10/2020-12/21/2020 - hospital admission and follow-up with Dr. Tobie Poet 1 week post SNF discharge. Follow-up renal ultrasound to evaluate left renal mass. Xarelto continued s/p PE.  Objective:  Lab Results  Component Value Date   CREATININE 2.43 (H) 03/01/2021   BUN  87 (HH) 03/01/2021   GFR 38.32 (L) 11/30/2014   GFRNONAA 34 (L) 12/03/2020   GFRAA 39 (L) 12/03/2020   NA 134 03/01/2021   K 4.7 03/01/2021   CALCIUM 9.7 03/01/2021   CO2 20 03/01/2021   GLUCOSE 89 03/01/2021    Lab  Results  Component Value Date/Time   HGBA1C 5.0 12/03/2020 11:53 AM   HGBA1C 5.7 (H) 08/26/2020 01:28 PM   GFR 38.32 (L) 11/30/2014 12:13 PM   GFR 35.46 (L) 04/13/2014 11:03 AM   MICROALBUR 10 12/25/2019 12:45 PM    Last diabetic Eye exam: No results found for: HMDIABEYEEXA  Last diabetic Foot exam: No results found for: HMDIABFOOTEX   Lab Results  Component Value Date   CHOL 92 (L) 03/01/2021   HDL 36 (L) 03/01/2021   LDLCALC 45 03/01/2021   TRIG 39 03/01/2021   CHOLHDL 2.6 03/01/2021    Hepatic Function Latest Ref Rng & Units 03/01/2021 01/21/2021 01/13/2021  Total Protein 6.0 - 8.5 g/dL 6.1 - 6.1  Albumin 3.6 - 4.6 g/dL 3.8 4.0 3.7  AST 0 - 40 IU/L $Remov'26 24 18  'viwTMD$ ALT 0 - 32 IU/L $Remov'15 12 9  'FcfCUn$ Alk Phosphatase 44 - 121 IU/L 39(L) 44 39(L)  Total Bilirubin 0.0 - 1.2 mg/dL 0.5 - 0.3    Lab Results  Component Value Date/Time   TSH 2.440 05/25/2020 12:15 PM   TSH 2.140 12/25/2019 10:58 AM    CBC Latest Ref Rng & Units 05/04/2021 03/01/2021 01/21/2021  WBC - 5.0 3.9 4.5  Hemoglobin 12.0 - 16.0 6.7(A) 9.2(L) 8.3(A)  Hematocrit 36 - 46 21(A) 29.5(L) 27(A)  Platelets 150 - 399 366 294 422(A)    Lab Results  Component Value Date/Time   VD25OH 61.7 03/01/2021 11:33 AM   VD25OH 61.7 08/12/2020 11:40 AM    Clinical ASCVD: No  The ASCVD Risk score Mikey Bussing DC Jr., et al., 2013) failed to calculate for the following reasons:   The 2013 ASCVD risk score is only valid for ages 38 to 52    Depression screen PHQ 2/9 02/22/2021 11/23/2020 08/26/2020  Decreased Interest 0 3 0  Down, Depressed, Hopeless 0 3 2  PHQ - 2 Score 0 6 2  Altered sleeping - 0 2  Tired, decreased energy - 3 3  Change in appetite - 3 0  Feeling bad or failure about yourself  - 3 0  Trouble concentrating - 3 0  Moving slowly or fidgety/restless - 3 2  Suicidal thoughts - 0 0  PHQ-9 Score - 21 9  Difficult doing work/chores - Extremely dIfficult -  Some recent data might be hidden      Social History   Tobacco Use   Smoking Status Never  Smokeless Tobacco Never   BP Readings from Last 3 Encounters:  05/05/21 (!) 131/46  05/04/21 (!) 147/65  03/13/21 110/62   Pulse Readings from Last 3 Encounters:  05/05/21 69  05/04/21 81  03/13/21 84   Wt Readings from Last 3 Encounters:  05/05/21 160 lb (72.6 kg)  05/04/21 160 lb (72.6 kg)  03/13/21 149 lb 3.2 oz (67.7 kg)   BMI Readings from Last 3 Encounters:  05/05/21 24.33 kg/m  05/04/21 24.33 kg/m  03/13/21 22.69 kg/m    Assessment/Interventions: Review of patient past medical history, allergies, medications, health status, including review of consultants reports, laboratory and other test data, was performed as part of comprehensive evaluation and provision of chronic care management services.   SDOH:  (Social Determinants of  Health) assessments and interventions performed: Yes  SDOH Screenings   Alcohol Screen: Not on file  Depression (PHQ2-9): Low Risk    PHQ-2 Score: 0  Financial Resource Strain: Not on file  Food Insecurity: Not on file  Housing: Not on file  Physical Activity: Not on file  Social Connections: Not on file  Stress: Not on file  Tobacco Use: Low Risk    Smoking Tobacco Use: Never   Smokeless Tobacco Use: Never  Transportation Needs: Not on file    CCM Care Plan  Allergies  Allergen Reactions   Gabapentin Swelling   Pregabalin Swelling        Morphine Nausea Only   Penicillins Rash and Other (See Comments)    Has patient had a PCN reaction causing immediate rash, facial/tongue/throat swelling, SOB or lightheadedness with hypotension: No Has patient had a PCN reaction causing severe rash involving mucus membranes or skin necrosis: No Has patient had a PCN reaction that required hospitalization: No - MD office Has patient had a PCN reaction occurring within the last 10 years: No If all of the above answers are "NO", then may proceed with Cephalosporin use.    Tape Rash   Ace Inhibitors Other (See  Comments)    Angioedema (ALLERGY/intolerance)   Carvedilol Other (See Comments)    Myalgias (intolerance)   Insulin Glargine Itching   Metoprolol Other (See Comments)    Angioedema (ALLERGY/intolerance)   Pantoprazole Nausea Only   Procaine Other (See Comments)    unknown   Statins Other (See Comments)    Myalgias (intolerance)   Lisinopril Rash         Medications Reviewed Today     Reviewed by Ottie Glazier (Physician Assistant Certified) on 40/98/11 at [place] List Status: <None>   Medication Order Taking? Sig Documenting Provider Last Dose Status Informant  Accu-Chek FastClix Lancets MISC [id number] No TEST BLOOD SUGAR TWICE DAILY E11.69 [name], MD Taking Active   ACCU-CHEK SMARTVIEW test strip [id number] No CHECK BLOOD SUGAR TWICE DAILY Marge Duncans, PA-C Taking Active   Alcohol Swabs (B-D SINGLE USE SWABS REGULAR) PADS [id number] No USE TWICE DAILY  E11.69 Cox, Elnita Maxwell, MD Taking Active   allopurinol (ZYLOPRIM) 100 MG tablet [id number] No  [provider] Taking Active   ALPRAZolam Duanne Moron) 0.5 MG tablet [id number] No TAKE ONE TABLET BY MOUTH EVERY NIGHT AT BEDTIME FOR INSOMNIA Cox, Kirsten, MD Taking Active   amLODipine (NORVASC) 5 MG tablet [id number] No TAKE 1 TABLET EVERY DAY Marge Duncans, PA-C Taking Active   bumetanide (BUMEX) 2 MG tablet [id number] No Take 2 mg by mouth 2 (two) times daily. [provider] Taking Active   calcium carbonate (OSCAL) 1500 (600 Ca) MG TABS tablet [id number] No Take 600 mg of elemental calcium by mouth 2 (two) times daily with a meal. [provider] Taking Active   digoxin (LANOXIN) 0.125 MG tablet [id number] No TAKE 1/2 TABLET EVERY DAY (SUBSTITUTED FOR  DIGITEK) Lillard Anes, MD Taking Active   DULoxetine (CYMBALTA) 20 MG capsule [id number] No TAKE 1 CAPSULE BY MOUTH EVERYDAY AT BEDTIME Cox, Kirsten, MD Taking Active   ezetimibe (ZETIA) 10 MG tablet [id number]  TAKE 1 TABLET EVERY DAY Cox, Kirsten, MD   Active   fenofibrate micronized (LOFIBRA) 134 MG capsule [id number] No TAKE 1 CAPSULE (134 MG TOTAL) BY MOUTH DAILY. Cox, Kirsten, MD Taking Active   fluticasone [facility]) 50 MCG/ACT nasal spray [id number] No USE 2 SPRAYS IN EACH NOSTRIL EVERY DAY  AS NEEDED Marianne Sofia, PA-C Taking Active   levothyroxine (SYNTHROID) 50 MCG tablet [id number] No Take 1 tablet (50 mcg total) by mouth daily before breakfast. Marianne Sofia, PA-C Taking Active   losartan (COZAAR) 100 MG tablet [id number] No TAKE 1 TABLET EVERY DAY Marianne Sofia, PA-C Taking Active   Multiple Vitamin (MULTIVITAMIN WITH MINERALS) TABS tablet [id number] No Take 1 tablet by mouth daily. Centrum Silver [provider] Taking Active Self  Naphazoline-Pheniramine (OPCON-A) 0.027-0.315 % SOLN [id number] No Apply 1 drop to eye in the morning and at bedtime. [provider] Taking Active   nystatin cream (MYCOSTATIN) [id number] No Apply 1 application topically 3 (three) times daily. Cream to back rash [provider] Taking Active   olopatadine (PATANOL) 0.1 % ophthalmic solution [id number] No Place 1 drop into both eyes 2 (two) times daily. [provider] Taking Active   omega-3 acid ethyl esters (LOVAZA) 1 g capsule [id number] No TAKE 1 CAPSULE TWICE DAILY Abigail Miyamoto, MD Taking Active   oxyCODONE-acetaminophen (PERCOCET) 7.5-325 MG tablet [id number] No Take 1 tablet by mouth every 6 (six) hours as needed (back pain.). Cox, Kirsten, MD Taking Active   pantoprazole (PROTONIX) 40 MG tablet [id number] No  [provider] Taking Active   polyethylene glycol (MIRALAX / GLYCOLAX) 17 g packet [id number] No Take 17 g by mouth daily as needed.  [provider] Taking Active   pravastatin (PRAVACHOL) 10 MG tablet [id number] No TAKE 1 TABLET FOUR TIMES WEEKLY Marianne Sofia, PA-C Taking Active   rOPINIRole (REQUIP) 1 MG tablet [id number] No TAKE 1 TABLET AT BEDTIME Abigail Miyamoto, MD Taking Active   senna  (SENOKOT) 8.6 MG TABS tablet [id number] No Take 2 tablets by mouth daily. [provider] Taking Active   spironolactone (ALDACTONE) 25 MG tablet [id number]  TAKE 1/2 TABLET EVERY DAY Cox, Kirsten, MD  Active   tiZANidine (ZANAFLEX) 4 MG tablet [id number] No TAKE 1 TABLET AT BEDTIME Marianne Sofia, PA-C Taking Active   XARELTO 15 MG TABS tablet [id number] No TAKE 1 TABLET DAILY WITH SUPPER. Blane Ohara, MD Taking Active             Patient Active Problem List   Diagnosis Date Noted   Morbid obesity (HCC) 01/13/2021   HFrEF (heart failure with reduced ejection fraction) (HCC) 01/13/2021   Chronic kidney disease, stage 4 (severe) (HCC)    GAD (generalized anxiety disorder)    Acute gastritis with hemorrhage 09/06/2020   Iron deficiency anemia 09/06/2020   Amiodarone pulmonary toxicity    Anemia    Arthritis    Carotid artery occlusion    CHF (congestive heart failure) (HCC)    Chronic a-fib (HCC)    Chronic kidney disease, stage IV (severe) (HCC)    Diabetes (HCC)    Fibromyalgia    Full dentures    GERD (gastroesophageal reflux disease)    Hyperlipemia    Hypertension    Hypertensive heart disease with heart failure (HCC)    Hypothalamic disease (HCC)    Hypothyroidism    LBBB (left bundle branch block)    Occlusion and stenosis of right carotid artery    Other persistent atrial fibrillation (HCC)    Pneumonia    Presence of automatic (implantable) cardiac defibrillator    PVD (peripheral vascular disease) (HCC)    RAS (renal artery stenosis) (HCC)    Renal insufficiency, mild    Restless leg syndrome    Type 2 diabetes mellitus with diabetic neuropathy (HCC)  Wears glasses    Mixed hyperlipidemia 12/25/2019   Dyslipidemia associated with type 2 diabetes mellitus ([place]) 12/25/2019   Secondary hypothyroidism 12/25/2019   Diabetic polyneuropathy associated with diabetes mellitus due to underlying condition ([place]) 12/01/2019   Primary osteoarthritis of left knee  12/01/2019   Carotid artery stenosis 09/24/2018   NSVT (nonsustained ventricular tachycardia) ([place]) 09/18/2018   Hallux limitus of right foot 06/27/2016   ATRIAL FIBRILLATION 12/26/2010   VENTRICULAR FIBRILLATION 01/41/0301   Chronic systolic heart failure ([place]) 12/26/2010   Automatic implantable cardioverter-defibrillator in situ 12/26/2010   Cardiomyopathy ([place]) 12/23/2010   ATRIOVENTRICULAR BLOCK, 3RD DEGREE 12/23/2010   AV BLOCK 12/23/2010   ICD (implantable cardioverter-defibrillator), biventricular, in situ 12/23/2010   Complete atrioventricular block ([place]) 12/23/2010   H/O cardiovascular stress test 08/04/2010   H/O echocardiogram 07/05/2010   VT (ventricular tachycardia) ([place]) 06/2010    Immunization History  Administered Date(s) Administered   Influenza-Unspecified 08/09/2018, 06/23/2019, 06/22/2020   PFIZER(Purple Top)SARS-COV-2 Vaccination 12/18/2019, 08/14/2020   Pneumococcal Conjugate-13 10/05/2014   Pneumococcal Polysaccharide-23 06/11/2013   Tdap 05/28/2017    Conditions to be addressed/monitored:  Diabetes and Heart Failure  There are no care plans that you recently modified to display for this patient.    Medication Assistance: None required.  Patient affirms current coverage meets needs.  Patient's preferred pharmacy is:  [id number] - [place], [place] [address] Phone: [phone] Fax: [phone]  [facility] - [place], [address] [place] [address] Phone: [phone] Fax: [phone]  Uses pill box? Yes Pt endorses good compliance  We discussed: Current pharmacy is preferred with insurance plan and patient is satisfied with pharmacy services Patient decided to: Continue current medication management strategy  Care Plan and Follow Up Patient Decision:  Patient agrees to Care Plan and  Follow-up.  Plan: Telephone follow up appointment with care management team member scheduled for:  07/2021

## 2021-05-20 ENCOUNTER — Ambulatory Visit (INDEPENDENT_AMBULATORY_CARE_PROVIDER_SITE_OTHER): Payer: Medicare HMO

## 2021-05-20 ENCOUNTER — Other Ambulatory Visit: Payer: Self-pay

## 2021-05-20 DIAGNOSIS — E1140 Type 2 diabetes mellitus with diabetic neuropathy, unspecified: Secondary | ICD-10-CM

## 2021-05-20 DIAGNOSIS — I110 Hypertensive heart disease with heart failure: Secondary | ICD-10-CM | POA: Diagnosis not present

## 2021-05-24 ENCOUNTER — Telehealth: Payer: Self-pay

## 2021-05-24 DIAGNOSIS — E1122 Type 2 diabetes mellitus with diabetic chronic kidney disease: Secondary | ICD-10-CM | POA: Diagnosis not present

## 2021-05-24 DIAGNOSIS — F32A Depression, unspecified: Secondary | ICD-10-CM | POA: Diagnosis not present

## 2021-05-24 DIAGNOSIS — E875 Hyperkalemia: Secondary | ICD-10-CM | POA: Diagnosis not present

## 2021-05-24 DIAGNOSIS — J9621 Acute and chronic respiratory failure with hypoxia: Secondary | ICD-10-CM | POA: Diagnosis not present

## 2021-05-24 DIAGNOSIS — E039 Hypothyroidism, unspecified: Secondary | ICD-10-CM | POA: Diagnosis not present

## 2021-05-24 DIAGNOSIS — N184 Chronic kidney disease, stage 4 (severe): Secondary | ICD-10-CM | POA: Diagnosis not present

## 2021-05-24 DIAGNOSIS — I130 Hypertensive heart and chronic kidney disease with heart failure and stage 1 through stage 4 chronic kidney disease, or unspecified chronic kidney disease: Secondary | ICD-10-CM | POA: Diagnosis not present

## 2021-05-24 DIAGNOSIS — I5023 Acute on chronic systolic (congestive) heart failure: Secondary | ICD-10-CM | POA: Diagnosis not present

## 2021-05-24 DIAGNOSIS — E7800 Pure hypercholesterolemia, unspecified: Secondary | ICD-10-CM | POA: Diagnosis not present

## 2021-05-24 NOTE — Telephone Encounter (Signed)
[facility] home health PT left VM. States he saw pt and pt  has had 11lb weight gain since hospital visit two weekends ago. Was in hospital for 3 days for CHF exacerbation. Requesting orders for nursing to do cardiopulmonary assessment.   Spoke w/ provider, provider wants pt seen in office, if not possible give order. Called pt. Pt not able to drive and family is not close by.   Called and left VM with [facility] home [facility] PT @ [phone] giving verbal orders for nursing to do assessment.   Royce Macadamia, [place] 05/24/21 9:50 AM

## 2021-05-25 DIAGNOSIS — F32A Depression, unspecified: Secondary | ICD-10-CM | POA: Diagnosis not present

## 2021-05-25 DIAGNOSIS — E039 Hypothyroidism, unspecified: Secondary | ICD-10-CM | POA: Diagnosis not present

## 2021-05-25 DIAGNOSIS — I130 Hypertensive heart and chronic kidney disease with heart failure and stage 1 through stage 4 chronic kidney disease, or unspecified chronic kidney disease: Secondary | ICD-10-CM | POA: Diagnosis not present

## 2021-05-25 DIAGNOSIS — E7800 Pure hypercholesterolemia, unspecified: Secondary | ICD-10-CM | POA: Diagnosis not present

## 2021-05-25 DIAGNOSIS — E1122 Type 2 diabetes mellitus with diabetic chronic kidney disease: Secondary | ICD-10-CM | POA: Diagnosis not present

## 2021-05-25 DIAGNOSIS — E875 Hyperkalemia: Secondary | ICD-10-CM | POA: Diagnosis not present

## 2021-05-25 DIAGNOSIS — J9621 Acute and chronic respiratory failure with hypoxia: Secondary | ICD-10-CM | POA: Diagnosis not present

## 2021-05-25 DIAGNOSIS — N184 Chronic kidney disease, stage 4 (severe): Secondary | ICD-10-CM | POA: Diagnosis not present

## 2021-05-25 DIAGNOSIS — I5023 Acute on chronic systolic (congestive) heart failure: Secondary | ICD-10-CM | POA: Diagnosis not present

## 2021-05-30 DIAGNOSIS — N184 Chronic kidney disease, stage 4 (severe): Secondary | ICD-10-CM | POA: Diagnosis not present

## 2021-05-30 DIAGNOSIS — J9621 Acute and chronic respiratory failure with hypoxia: Secondary | ICD-10-CM | POA: Diagnosis not present

## 2021-05-30 DIAGNOSIS — I5023 Acute on chronic systolic (congestive) heart failure: Secondary | ICD-10-CM | POA: Diagnosis not present

## 2021-05-30 DIAGNOSIS — E7800 Pure hypercholesterolemia, unspecified: Secondary | ICD-10-CM | POA: Diagnosis not present

## 2021-05-30 DIAGNOSIS — F32A Depression, unspecified: Secondary | ICD-10-CM | POA: Diagnosis not present

## 2021-05-30 DIAGNOSIS — E1122 Type 2 diabetes mellitus with diabetic chronic kidney disease: Secondary | ICD-10-CM | POA: Diagnosis not present

## 2021-05-30 DIAGNOSIS — E875 Hyperkalemia: Secondary | ICD-10-CM | POA: Diagnosis not present

## 2021-05-30 DIAGNOSIS — E039 Hypothyroidism, unspecified: Secondary | ICD-10-CM | POA: Diagnosis not present

## 2021-05-30 DIAGNOSIS — I130 Hypertensive heart and chronic kidney disease with heart failure and stage 1 through stage 4 chronic kidney disease, or unspecified chronic kidney disease: Secondary | ICD-10-CM | POA: Diagnosis not present

## 2021-05-31 DIAGNOSIS — Z95810 Presence of automatic (implantable) cardiac defibrillator: Secondary | ICD-10-CM | POA: Diagnosis not present

## 2021-05-31 DIAGNOSIS — Z48813 Encounter for surgical aftercare following surgery on the respiratory system: Secondary | ICD-10-CM | POA: Diagnosis not present

## 2021-05-31 DIAGNOSIS — I161 Hypertensive emergency: Secondary | ICD-10-CM | POA: Diagnosis not present

## 2021-05-31 DIAGNOSIS — R911 Solitary pulmonary nodule: Secondary | ICD-10-CM | POA: Diagnosis not present

## 2021-05-31 DIAGNOSIS — Z79899 Other long term (current) drug therapy: Secondary | ICD-10-CM | POA: Diagnosis not present

## 2021-05-31 DIAGNOSIS — J9811 Atelectasis: Secondary | ICD-10-CM | POA: Diagnosis not present

## 2021-05-31 DIAGNOSIS — J90 Pleural effusion, not elsewhere classified: Secondary | ICD-10-CM | POA: Diagnosis not present

## 2021-05-31 DIAGNOSIS — R0602 Shortness of breath: Secondary | ICD-10-CM | POA: Diagnosis not present

## 2021-05-31 DIAGNOSIS — I313 Pericardial effusion (noninflammatory): Secondary | ICD-10-CM | POA: Diagnosis not present

## 2021-05-31 DIAGNOSIS — I5023 Acute on chronic systolic (congestive) heart failure: Secondary | ICD-10-CM | POA: Diagnosis not present

## 2021-05-31 DIAGNOSIS — I517 Cardiomegaly: Secondary | ICD-10-CM | POA: Diagnosis not present

## 2021-05-31 DIAGNOSIS — N179 Acute kidney failure, unspecified: Secondary | ICD-10-CM | POA: Diagnosis not present

## 2021-05-31 DIAGNOSIS — Z794 Long term (current) use of insulin: Secondary | ICD-10-CM | POA: Diagnosis not present

## 2021-05-31 DIAGNOSIS — I130 Hypertensive heart and chronic kidney disease with heart failure and stage 1 through stage 4 chronic kidney disease, or unspecified chronic kidney disease: Secondary | ICD-10-CM | POA: Diagnosis not present

## 2021-05-31 DIAGNOSIS — I10 Essential (primary) hypertension: Secondary | ICD-10-CM | POA: Diagnosis not present

## 2021-05-31 DIAGNOSIS — I700 Atherosclerosis of aorta: Secondary | ICD-10-CM | POA: Diagnosis not present

## 2021-05-31 DIAGNOSIS — N184 Chronic kidney disease, stage 4 (severe): Secondary | ICD-10-CM | POA: Diagnosis not present

## 2021-05-31 DIAGNOSIS — I480 Paroxysmal atrial fibrillation: Secondary | ICD-10-CM | POA: Diagnosis not present

## 2021-06-01 ENCOUNTER — Other Ambulatory Visit: Payer: Self-pay | Admitting: Physician Assistant

## 2021-06-02 ENCOUNTER — Ambulatory Visit: Payer: Medicare HMO | Admitting: Family Medicine

## 2021-06-04 DIAGNOSIS — I5023 Acute on chronic systolic (congestive) heart failure: Secondary | ICD-10-CM | POA: Diagnosis not present

## 2021-06-04 DIAGNOSIS — N184 Chronic kidney disease, stage 4 (severe): Secondary | ICD-10-CM | POA: Diagnosis not present

## 2021-06-04 DIAGNOSIS — E875 Hyperkalemia: Secondary | ICD-10-CM | POA: Diagnosis not present

## 2021-06-04 DIAGNOSIS — E039 Hypothyroidism, unspecified: Secondary | ICD-10-CM | POA: Diagnosis not present

## 2021-06-04 DIAGNOSIS — F32A Depression, unspecified: Secondary | ICD-10-CM | POA: Diagnosis not present

## 2021-06-04 DIAGNOSIS — I130 Hypertensive heart and chronic kidney disease with heart failure and stage 1 through stage 4 chronic kidney disease, or unspecified chronic kidney disease: Secondary | ICD-10-CM | POA: Diagnosis not present

## 2021-06-04 DIAGNOSIS — E7800 Pure hypercholesterolemia, unspecified: Secondary | ICD-10-CM | POA: Diagnosis not present

## 2021-06-04 DIAGNOSIS — J9621 Acute and chronic respiratory failure with hypoxia: Secondary | ICD-10-CM | POA: Diagnosis not present

## 2021-06-04 DIAGNOSIS — E1122 Type 2 diabetes mellitus with diabetic chronic kidney disease: Secondary | ICD-10-CM | POA: Diagnosis not present

## 2021-06-06 ENCOUNTER — Telehealth: Payer: Self-pay

## 2021-06-06 DIAGNOSIS — N184 Chronic kidney disease, stage 4 (severe): Secondary | ICD-10-CM | POA: Diagnosis not present

## 2021-06-06 DIAGNOSIS — E875 Hyperkalemia: Secondary | ICD-10-CM | POA: Diagnosis not present

## 2021-06-06 DIAGNOSIS — E7800 Pure hypercholesterolemia, unspecified: Secondary | ICD-10-CM | POA: Diagnosis not present

## 2021-06-06 DIAGNOSIS — I130 Hypertensive heart and chronic kidney disease with heart failure and stage 1 through stage 4 chronic kidney disease, or unspecified chronic kidney disease: Secondary | ICD-10-CM | POA: Diagnosis not present

## 2021-06-06 DIAGNOSIS — E1122 Type 2 diabetes mellitus with diabetic chronic kidney disease: Secondary | ICD-10-CM | POA: Diagnosis not present

## 2021-06-06 DIAGNOSIS — E039 Hypothyroidism, unspecified: Secondary | ICD-10-CM | POA: Diagnosis not present

## 2021-06-06 DIAGNOSIS — J9621 Acute and chronic respiratory failure with hypoxia: Secondary | ICD-10-CM | POA: Diagnosis not present

## 2021-06-06 DIAGNOSIS — F32A Depression, unspecified: Secondary | ICD-10-CM | POA: Diagnosis not present

## 2021-06-06 DIAGNOSIS — I5023 Acute on chronic systolic (congestive) heart failure: Secondary | ICD-10-CM | POA: Diagnosis not present

## 2021-06-06 NOTE — Telephone Encounter (Signed)
Home health calling for verbal orders. Pt was in hospital this weekend but d/c. They would like to finish cert period, requesting nursing to see pt twice this week then once next week. Gave verbal orders.   Harrell Lark 06/06/21 9:33 AM

## 2021-06-07 ENCOUNTER — Telehealth: Payer: Self-pay

## 2021-06-07 DIAGNOSIS — E7800 Pure hypercholesterolemia, unspecified: Secondary | ICD-10-CM | POA: Diagnosis not present

## 2021-06-07 DIAGNOSIS — J9621 Acute and chronic respiratory failure with hypoxia: Secondary | ICD-10-CM | POA: Diagnosis not present

## 2021-06-07 DIAGNOSIS — E875 Hyperkalemia: Secondary | ICD-10-CM | POA: Diagnosis not present

## 2021-06-07 DIAGNOSIS — E1122 Type 2 diabetes mellitus with diabetic chronic kidney disease: Secondary | ICD-10-CM | POA: Diagnosis not present

## 2021-06-07 DIAGNOSIS — E039 Hypothyroidism, unspecified: Secondary | ICD-10-CM | POA: Diagnosis not present

## 2021-06-07 DIAGNOSIS — N184 Chronic kidney disease, stage 4 (severe): Secondary | ICD-10-CM | POA: Diagnosis not present

## 2021-06-07 DIAGNOSIS — I130 Hypertensive heart and chronic kidney disease with heart failure and stage 1 through stage 4 chronic kidney disease, or unspecified chronic kidney disease: Secondary | ICD-10-CM | POA: Diagnosis not present

## 2021-06-07 DIAGNOSIS — I5023 Acute on chronic systolic (congestive) heart failure: Secondary | ICD-10-CM | POA: Diagnosis not present

## 2021-06-07 DIAGNOSIS — F32A Depression, unspecified: Secondary | ICD-10-CM | POA: Diagnosis not present

## 2021-06-07 NOTE — Telephone Encounter (Signed)
PT w/ [facility] calling requesting verbal orders. Requesting to see pt once for one week then twice for one week. Verbal orders given to PT for schedule.   Royce Macadamia, [place] 06/07/21 8:30 AM

## 2021-06-08 NOTE — Patient Instructions (Signed)
Visit Information   Goals Addressed   None    Patient Care Plan: CCM Pharmacy Care Plan     Problem Identified: dm, chf, hld   Priority: High  Onset Date: 01/10/2021     Long-Range Goal: Disease Management   Start Date: 01/10/2021  Expected End Date: 01/10/2022  Recent Progress: On track  Priority: High  Note:   Current Barriers:  Unable to maintain control of heart failure     Pharmacist Clinical Goal(s):  Patient will maintain control of heart failure as evidenced by symptom management and avoiding hospitalization  through collaboration with PharmD and provider.    Interventions: 1:1 collaboration with Cox, Kirsten, MD regarding development and update of comprehensive plan of care as evidenced by provider attestation and co-signature Inter-disciplinary care team collaboration (see longitudinal plan of care) Comprehensive medication review performed; medication list updated in electronic medical record   Diabetes (A1c goal 7.5%) -Controlled -Current medications: accu-chek fast clix lancets accu-chek smartview test strips -Medications previously tried: insulin   -Current home glucose readings fasting glucose: 74 mg/dL not taking meds -Denies hypoglycemic/hyperglycemic symptoms -Current meal patterns:  breakfast: daughter fixing breakfast eggs and grits lunch: sandwich or peanut butter carckers  dinner: family brings in meals - meat and vegetables snacks: peanut butter crackers drinks: water -Current exercise: working with PT -Educated on A1c and blood sugar goals; Prevention and management of hypoglycemic episodes; Benefits of routine self-monitoring of blood sugar; -Counseled to check feet daily and get yearly eye exams -Counseled on diet and exercise extensively Recommended to continue current medication   Heart Failure (Goal: manage symptoms and prevent exacerbations) -Controlled -Last ejection fraction: 96-78% -HF type: Systolic -NYHA Class: II (slight  limitation of activity)   -Current treatment: amlodipine 5 mg daily  Bumetanide 1 mg daily Losartan 100 mg daily  -Medications previously tried: lasix, bumex, carvedilol, metoprolol, spironolactone -Current home BP/HR readings:  129-140/80 mmHg -Current dietary habits: children bringing in meals. States she is eating well.  -Current exercise habits: working with PT at home  -Educated on Benefits of medications for managing symptoms and prolonging life Importance of weighing daily; if you gain more than 3 pounds in one day or 5 pounds in one week,    Importance of blood pressure control -Counseled on diet and exercise extensively Recommended to continue current medication     Patient Goals/Self-Care Activities Patient will:  - take medications as prescribed focus on medication adherence by using pill box check glucose daily, document, and provide at future appointments check blood pressure daily, document, and provide at future appointments   Follow Up Plan: Telephone follow up appointment with care management team member scheduled for: 07/2021      The patient verbalized understanding of instructions, educational materials, and care plan provided today and declined offer to receive copy of patient instructions, educational materials, and care plan.  Telephone follow up appointment with pharmacy team member scheduled for:07/2021  Burnice Logan, [facility]

## 2021-06-09 DIAGNOSIS — E1122 Type 2 diabetes mellitus with diabetic chronic kidney disease: Secondary | ICD-10-CM | POA: Diagnosis not present

## 2021-06-09 DIAGNOSIS — E7800 Pure hypercholesterolemia, unspecified: Secondary | ICD-10-CM | POA: Diagnosis not present

## 2021-06-09 DIAGNOSIS — F32A Depression, unspecified: Secondary | ICD-10-CM | POA: Diagnosis not present

## 2021-06-09 DIAGNOSIS — J9621 Acute and chronic respiratory failure with hypoxia: Secondary | ICD-10-CM | POA: Diagnosis not present

## 2021-06-09 DIAGNOSIS — E875 Hyperkalemia: Secondary | ICD-10-CM | POA: Diagnosis not present

## 2021-06-09 DIAGNOSIS — E039 Hypothyroidism, unspecified: Secondary | ICD-10-CM | POA: Diagnosis not present

## 2021-06-09 DIAGNOSIS — I130 Hypertensive heart and chronic kidney disease with heart failure and stage 1 through stage 4 chronic kidney disease, or unspecified chronic kidney disease: Secondary | ICD-10-CM | POA: Diagnosis not present

## 2021-06-09 DIAGNOSIS — N184 Chronic kidney disease, stage 4 (severe): Secondary | ICD-10-CM | POA: Diagnosis not present

## 2021-06-09 DIAGNOSIS — I5023 Acute on chronic systolic (congestive) heart failure: Secondary | ICD-10-CM | POA: Diagnosis not present

## 2021-06-10 DIAGNOSIS — E1122 Type 2 diabetes mellitus with diabetic chronic kidney disease: Secondary | ICD-10-CM | POA: Diagnosis not present

## 2021-06-10 DIAGNOSIS — I130 Hypertensive heart and chronic kidney disease with heart failure and stage 1 through stage 4 chronic kidney disease, or unspecified chronic kidney disease: Secondary | ICD-10-CM | POA: Diagnosis not present

## 2021-06-10 DIAGNOSIS — J9621 Acute and chronic respiratory failure with hypoxia: Secondary | ICD-10-CM | POA: Diagnosis not present

## 2021-06-10 DIAGNOSIS — F32A Depression, unspecified: Secondary | ICD-10-CM | POA: Diagnosis not present

## 2021-06-10 DIAGNOSIS — I5023 Acute on chronic systolic (congestive) heart failure: Secondary | ICD-10-CM | POA: Diagnosis not present

## 2021-06-10 DIAGNOSIS — E7800 Pure hypercholesterolemia, unspecified: Secondary | ICD-10-CM | POA: Diagnosis not present

## 2021-06-10 DIAGNOSIS — E875 Hyperkalemia: Secondary | ICD-10-CM | POA: Diagnosis not present

## 2021-06-10 DIAGNOSIS — N184 Chronic kidney disease, stage 4 (severe): Secondary | ICD-10-CM | POA: Diagnosis not present

## 2021-06-10 DIAGNOSIS — E039 Hypothyroidism, unspecified: Secondary | ICD-10-CM | POA: Diagnosis not present

## 2021-06-12 ENCOUNTER — Other Ambulatory Visit: Payer: Self-pay | Admitting: Legal Medicine

## 2021-06-12 DIAGNOSIS — K449 Diaphragmatic hernia without obstruction or gangrene: Secondary | ICD-10-CM | POA: Diagnosis not present

## 2021-06-12 DIAGNOSIS — K5790 Diverticulosis of intestine, part unspecified, without perforation or abscess without bleeding: Secondary | ICD-10-CM | POA: Diagnosis not present

## 2021-06-12 DIAGNOSIS — I130 Hypertensive heart and chronic kidney disease with heart failure and stage 1 through stage 4 chronic kidney disease, or unspecified chronic kidney disease: Secondary | ICD-10-CM | POA: Diagnosis not present

## 2021-06-12 DIAGNOSIS — I119 Hypertensive heart disease without heart failure: Secondary | ICD-10-CM | POA: Diagnosis not present

## 2021-06-12 DIAGNOSIS — I482 Chronic atrial fibrillation, unspecified: Secondary | ICD-10-CM | POA: Diagnosis not present

## 2021-06-12 DIAGNOSIS — N184 Chronic kidney disease, stage 4 (severe): Secondary | ICD-10-CM | POA: Diagnosis not present

## 2021-06-12 DIAGNOSIS — K635 Polyp of colon: Secondary | ICD-10-CM | POA: Diagnosis not present

## 2021-06-12 DIAGNOSIS — Z7901 Long term (current) use of anticoagulants: Secondary | ICD-10-CM | POA: Diagnosis not present

## 2021-06-12 DIAGNOSIS — E119 Type 2 diabetes mellitus without complications: Secondary | ICD-10-CM | POA: Diagnosis not present

## 2021-06-12 DIAGNOSIS — K922 Gastrointestinal hemorrhage, unspecified: Secondary | ICD-10-CM | POA: Diagnosis not present

## 2021-06-12 DIAGNOSIS — E1122 Type 2 diabetes mellitus with diabetic chronic kidney disease: Secondary | ICD-10-CM | POA: Diagnosis not present

## 2021-06-12 DIAGNOSIS — I517 Cardiomegaly: Secondary | ICD-10-CM | POA: Diagnosis not present

## 2021-06-12 DIAGNOSIS — I361 Nonrheumatic tricuspid (valve) insufficiency: Secondary | ICD-10-CM | POA: Diagnosis not present

## 2021-06-12 DIAGNOSIS — I10 Essential (primary) hypertension: Secondary | ICD-10-CM | POA: Diagnosis not present

## 2021-06-12 DIAGNOSIS — J9811 Atelectasis: Secondary | ICD-10-CM | POA: Diagnosis not present

## 2021-06-12 DIAGNOSIS — I340 Nonrheumatic mitral (valve) insufficiency: Secondary | ICD-10-CM | POA: Diagnosis not present

## 2021-06-12 DIAGNOSIS — I5023 Acute on chronic systolic (congestive) heart failure: Secondary | ICD-10-CM | POA: Diagnosis not present

## 2021-06-12 DIAGNOSIS — R0602 Shortness of breath: Secondary | ICD-10-CM | POA: Diagnosis not present

## 2021-06-12 DIAGNOSIS — J90 Pleural effusion, not elsewhere classified: Secondary | ICD-10-CM | POA: Diagnosis not present

## 2021-06-12 DIAGNOSIS — K5730 Diverticulosis of large intestine without perforation or abscess without bleeding: Secondary | ICD-10-CM | POA: Diagnosis not present

## 2021-06-12 DIAGNOSIS — K921 Melena: Secondary | ICD-10-CM | POA: Diagnosis not present

## 2021-06-12 DIAGNOSIS — N179 Acute kidney failure, unspecified: Secondary | ICD-10-CM | POA: Diagnosis not present

## 2021-06-12 DIAGNOSIS — D509 Iron deficiency anemia, unspecified: Secondary | ICD-10-CM | POA: Diagnosis not present

## 2021-06-12 DIAGNOSIS — D122 Benign neoplasm of ascending colon: Secondary | ICD-10-CM | POA: Diagnosis not present

## 2021-06-12 DIAGNOSIS — I509 Heart failure, unspecified: Secondary | ICD-10-CM | POA: Diagnosis not present

## 2021-06-12 DIAGNOSIS — K219 Gastro-esophageal reflux disease without esophagitis: Secondary | ICD-10-CM | POA: Diagnosis not present

## 2021-06-12 DIAGNOSIS — D500 Iron deficiency anemia secondary to blood loss (chronic): Secondary | ICD-10-CM | POA: Diagnosis not present

## 2021-06-12 DIAGNOSIS — Z8711 Personal history of peptic ulcer disease: Secondary | ICD-10-CM | POA: Diagnosis not present

## 2021-06-12 DIAGNOSIS — D631 Anemia in chronic kidney disease: Secondary | ICD-10-CM | POA: Diagnosis not present

## 2021-06-12 DIAGNOSIS — D649 Anemia, unspecified: Secondary | ICD-10-CM | POA: Diagnosis not present

## 2021-06-12 DIAGNOSIS — Z8719 Personal history of other diseases of the digestive system: Secondary | ICD-10-CM | POA: Diagnosis not present

## 2021-06-14 ENCOUNTER — Other Ambulatory Visit: Payer: Self-pay | Admitting: Oncology

## 2021-06-14 ENCOUNTER — Inpatient Hospital Stay: Payer: Medicare HMO | Admitting: Family Medicine

## 2021-06-14 ENCOUNTER — Inpatient Hospital Stay: Payer: Medicare HMO | Admitting: Oncology

## 2021-06-14 ENCOUNTER — Inpatient Hospital Stay: Payer: Medicare HMO | Attending: Oncology

## 2021-06-14 ENCOUNTER — Telehealth: Payer: Self-pay

## 2021-06-14 DIAGNOSIS — I5023 Acute on chronic systolic (congestive) heart failure: Secondary | ICD-10-CM | POA: Diagnosis not present

## 2021-06-14 DIAGNOSIS — I482 Chronic atrial fibrillation, unspecified: Secondary | ICD-10-CM | POA: Diagnosis not present

## 2021-06-14 DIAGNOSIS — D649 Anemia, unspecified: Secondary | ICD-10-CM | POA: Diagnosis not present

## 2021-06-14 DIAGNOSIS — I340 Nonrheumatic mitral (valve) insufficiency: Secondary | ICD-10-CM

## 2021-06-14 DIAGNOSIS — I119 Hypertensive heart disease without heart failure: Secondary | ICD-10-CM

## 2021-06-14 DIAGNOSIS — J90 Pleural effusion, not elsewhere classified: Secondary | ICD-10-CM | POA: Diagnosis not present

## 2021-06-14 DIAGNOSIS — D509 Iron deficiency anemia, unspecified: Secondary | ICD-10-CM

## 2021-06-14 DIAGNOSIS — Z7901 Long term (current) use of anticoagulants: Secondary | ICD-10-CM | POA: Diagnosis not present

## 2021-06-14 DIAGNOSIS — N184 Chronic kidney disease, stage 4 (severe): Secondary | ICD-10-CM

## 2021-06-14 DIAGNOSIS — I517 Cardiomegaly: Secondary | ICD-10-CM | POA: Diagnosis not present

## 2021-06-14 NOTE — Telephone Encounter (Signed)
Ronalee Belts from [facility] called requesting orders for them to go out and do re-evaluation since the patient is nearing the end of her PT time frame and was needing the ok for it. Per Dr. Tobie Poet this is ok and order was given verbally and Physical therapist understood verbally and will be sending over order for final sign off. [place]

## 2021-06-15 DIAGNOSIS — Z7901 Long term (current) use of anticoagulants: Secondary | ICD-10-CM | POA: Diagnosis not present

## 2021-06-15 DIAGNOSIS — I361 Nonrheumatic tricuspid (valve) insufficiency: Secondary | ICD-10-CM

## 2021-06-15 DIAGNOSIS — D649 Anemia, unspecified: Secondary | ICD-10-CM | POA: Diagnosis not present

## 2021-06-15 DIAGNOSIS — I5023 Acute on chronic systolic (congestive) heart failure: Secondary | ICD-10-CM | POA: Diagnosis not present

## 2021-06-15 DIAGNOSIS — I482 Chronic atrial fibrillation, unspecified: Secondary | ICD-10-CM | POA: Diagnosis not present

## 2021-06-15 DIAGNOSIS — I340 Nonrheumatic mitral (valve) insufficiency: Secondary | ICD-10-CM

## 2021-06-16 DIAGNOSIS — I482 Chronic atrial fibrillation, unspecified: Secondary | ICD-10-CM | POA: Diagnosis not present

## 2021-06-16 DIAGNOSIS — D649 Anemia, unspecified: Secondary | ICD-10-CM | POA: Diagnosis not present

## 2021-06-16 DIAGNOSIS — I5023 Acute on chronic systolic (congestive) heart failure: Secondary | ICD-10-CM | POA: Diagnosis not present

## 2021-06-16 DIAGNOSIS — Z7901 Long term (current) use of anticoagulants: Secondary | ICD-10-CM | POA: Diagnosis not present

## 2021-06-17 ENCOUNTER — Other Ambulatory Visit: Payer: Self-pay | Admitting: Hematology and Oncology

## 2021-06-17 DIAGNOSIS — Z8711 Personal history of peptic ulcer disease: Secondary | ICD-10-CM | POA: Diagnosis not present

## 2021-06-17 DIAGNOSIS — Z8719 Personal history of other diseases of the digestive system: Secondary | ICD-10-CM | POA: Diagnosis not present

## 2021-06-17 DIAGNOSIS — J90 Pleural effusion, not elsewhere classified: Secondary | ICD-10-CM | POA: Diagnosis not present

## 2021-06-17 DIAGNOSIS — I482 Chronic atrial fibrillation, unspecified: Secondary | ICD-10-CM | POA: Diagnosis not present

## 2021-06-17 DIAGNOSIS — K922 Gastrointestinal hemorrhage, unspecified: Secondary | ICD-10-CM | POA: Diagnosis not present

## 2021-06-17 DIAGNOSIS — D649 Anemia, unspecified: Secondary | ICD-10-CM | POA: Diagnosis not present

## 2021-06-17 DIAGNOSIS — I5023 Acute on chronic systolic (congestive) heart failure: Secondary | ICD-10-CM | POA: Diagnosis not present

## 2021-06-17 DIAGNOSIS — K5790 Diverticulosis of intestine, part unspecified, without perforation or abscess without bleeding: Secondary | ICD-10-CM | POA: Diagnosis not present

## 2021-06-17 DIAGNOSIS — I509 Heart failure, unspecified: Secondary | ICD-10-CM | POA: Diagnosis not present

## 2021-06-17 DIAGNOSIS — D500 Iron deficiency anemia secondary to blood loss (chronic): Secondary | ICD-10-CM | POA: Diagnosis not present

## 2021-06-17 DIAGNOSIS — I517 Cardiomegaly: Secondary | ICD-10-CM | POA: Diagnosis not present

## 2021-06-17 DIAGNOSIS — K921 Melena: Secondary | ICD-10-CM | POA: Diagnosis not present

## 2021-06-17 DIAGNOSIS — J9811 Atelectasis: Secondary | ICD-10-CM | POA: Diagnosis not present

## 2021-06-17 DIAGNOSIS — Z7901 Long term (current) use of anticoagulants: Secondary | ICD-10-CM | POA: Diagnosis not present

## 2021-06-17 NOTE — Progress Notes (Unsigned)
The patient is hospitalized with acute on chronic heart failure.  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 She was scheduled to see Dr. Bobby Rumpf earlier this week, but was already in the hospital.  She presented with a hemoglobin of 7.6.  She received 1 unit of packed red blood cells and the hemoglobin improved to 8.5, but is 8.2 today.  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  He feels she may need additional IV iron and possibly epoetin. He requests that we see her again next week.  I ordered iron studies today, which revealed recurrent iron deficiency. Dr. Bettina Gavia contacted me back and asked about giving her another unit of packed red blood cells, which Dr. Bobby Rumpf was in agreement with. Dr. Bobby Rumpf wanted me to talk to GI regarding tagged RBC scan to check for bleeding. I contacted Dr. Lyda Jester for his opinion and he was fine We also asked the hospitalist to administer Feraheme while she is inpatient and consider nuc med test for GI bleeding.  Dr. Shanon Brow did did not feel she was bleeding, but was willing to administer Feraheme. We will set her up for an appointment as an OP next week and plan to administer a 2nd dose of Feraheme.

## 2021-06-18 DIAGNOSIS — D649 Anemia, unspecified: Secondary | ICD-10-CM | POA: Diagnosis not present

## 2021-06-18 DIAGNOSIS — K922 Gastrointestinal hemorrhage, unspecified: Secondary | ICD-10-CM | POA: Diagnosis not present

## 2021-06-18 DIAGNOSIS — I482 Chronic atrial fibrillation, unspecified: Secondary | ICD-10-CM

## 2021-06-18 DIAGNOSIS — Z8711 Personal history of peptic ulcer disease: Secondary | ICD-10-CM | POA: Diagnosis not present

## 2021-06-18 DIAGNOSIS — Z7901 Long term (current) use of anticoagulants: Secondary | ICD-10-CM

## 2021-06-18 DIAGNOSIS — K5790 Diverticulosis of intestine, part unspecified, without perforation or abscess without bleeding: Secondary | ICD-10-CM | POA: Diagnosis not present

## 2021-06-18 DIAGNOSIS — D500 Iron deficiency anemia secondary to blood loss (chronic): Secondary | ICD-10-CM | POA: Diagnosis not present

## 2021-06-18 DIAGNOSIS — I5023 Acute on chronic systolic (congestive) heart failure: Secondary | ICD-10-CM

## 2021-06-18 DIAGNOSIS — K921 Melena: Secondary | ICD-10-CM | POA: Diagnosis not present

## 2021-06-18 DIAGNOSIS — I340 Nonrheumatic mitral (valve) insufficiency: Secondary | ICD-10-CM

## 2021-06-19 DIAGNOSIS — I340 Nonrheumatic mitral (valve) insufficiency: Secondary | ICD-10-CM

## 2021-06-19 DIAGNOSIS — K922 Gastrointestinal hemorrhage, unspecified: Secondary | ICD-10-CM

## 2021-06-19 DIAGNOSIS — E119 Type 2 diabetes mellitus without complications: Secondary | ICD-10-CM

## 2021-06-19 DIAGNOSIS — I482 Chronic atrial fibrillation, unspecified: Secondary | ICD-10-CM

## 2021-06-19 DIAGNOSIS — Z7901 Long term (current) use of anticoagulants: Secondary | ICD-10-CM

## 2021-06-19 DIAGNOSIS — N184 Chronic kidney disease, stage 4 (severe): Secondary | ICD-10-CM

## 2021-06-19 DIAGNOSIS — I5023 Acute on chronic systolic (congestive) heart failure: Secondary | ICD-10-CM

## 2021-06-20 DIAGNOSIS — Z8711 Personal history of peptic ulcer disease: Secondary | ICD-10-CM | POA: Diagnosis not present

## 2021-06-20 DIAGNOSIS — Z7901 Long term (current) use of anticoagulants: Secondary | ICD-10-CM | POA: Diagnosis not present

## 2021-06-20 DIAGNOSIS — K922 Gastrointestinal hemorrhage, unspecified: Secondary | ICD-10-CM | POA: Diagnosis not present

## 2021-06-20 DIAGNOSIS — D649 Anemia, unspecified: Secondary | ICD-10-CM | POA: Diagnosis not present

## 2021-06-20 DIAGNOSIS — D500 Iron deficiency anemia secondary to blood loss (chronic): Secondary | ICD-10-CM | POA: Diagnosis not present

## 2021-06-20 DIAGNOSIS — I5023 Acute on chronic systolic (congestive) heart failure: Secondary | ICD-10-CM | POA: Diagnosis not present

## 2021-06-20 DIAGNOSIS — K921 Melena: Secondary | ICD-10-CM | POA: Diagnosis not present

## 2021-06-20 DIAGNOSIS — I482 Chronic atrial fibrillation, unspecified: Secondary | ICD-10-CM | POA: Diagnosis not present

## 2021-06-20 DIAGNOSIS — K5790 Diverticulosis of intestine, part unspecified, without perforation or abscess without bleeding: Secondary | ICD-10-CM | POA: Diagnosis not present

## 2021-06-21 DIAGNOSIS — I5023 Acute on chronic systolic (congestive) heart failure: Secondary | ICD-10-CM | POA: Diagnosis not present

## 2021-06-21 DIAGNOSIS — Z7901 Long term (current) use of anticoagulants: Secondary | ICD-10-CM | POA: Diagnosis not present

## 2021-06-21 DIAGNOSIS — D649 Anemia, unspecified: Secondary | ICD-10-CM | POA: Diagnosis not present

## 2021-06-21 DIAGNOSIS — K5790 Diverticulosis of intestine, part unspecified, without perforation or abscess without bleeding: Secondary | ICD-10-CM | POA: Diagnosis not present

## 2021-06-21 DIAGNOSIS — D500 Iron deficiency anemia secondary to blood loss (chronic): Secondary | ICD-10-CM | POA: Diagnosis not present

## 2021-06-21 DIAGNOSIS — Z8711 Personal history of peptic ulcer disease: Secondary | ICD-10-CM | POA: Diagnosis not present

## 2021-06-21 DIAGNOSIS — K922 Gastrointestinal hemorrhage, unspecified: Secondary | ICD-10-CM | POA: Diagnosis not present

## 2021-06-21 DIAGNOSIS — K921 Melena: Secondary | ICD-10-CM | POA: Diagnosis not present

## 2021-06-21 DIAGNOSIS — I482 Chronic atrial fibrillation, unspecified: Secondary | ICD-10-CM | POA: Diagnosis not present

## 2021-06-22 ENCOUNTER — Other Ambulatory Visit: Payer: Self-pay | Admitting: Oncology

## 2021-06-22 DIAGNOSIS — E119 Type 2 diabetes mellitus without complications: Secondary | ICD-10-CM

## 2021-06-22 DIAGNOSIS — Z7901 Long term (current) use of anticoagulants: Secondary | ICD-10-CM

## 2021-06-22 DIAGNOSIS — K922 Gastrointestinal hemorrhage, unspecified: Secondary | ICD-10-CM | POA: Diagnosis not present

## 2021-06-22 DIAGNOSIS — I340 Nonrheumatic mitral (valve) insufficiency: Secondary | ICD-10-CM

## 2021-06-22 DIAGNOSIS — N184 Chronic kidney disease, stage 4 (severe): Secondary | ICD-10-CM

## 2021-06-22 DIAGNOSIS — I5023 Acute on chronic systolic (congestive) heart failure: Secondary | ICD-10-CM | POA: Diagnosis not present

## 2021-06-22 DIAGNOSIS — D508 Other iron deficiency anemias: Secondary | ICD-10-CM

## 2021-06-22 DIAGNOSIS — I509 Heart failure, unspecified: Secondary | ICD-10-CM | POA: Diagnosis not present

## 2021-06-22 DIAGNOSIS — I482 Chronic atrial fibrillation, unspecified: Secondary | ICD-10-CM | POA: Diagnosis not present

## 2021-06-22 MED FILL — Ferumoxytol Inj 510 MG/17ML (30 MG/ML) (Elemental Fe): INTRAVENOUS | Qty: 17 | Status: AC

## 2021-06-23 ENCOUNTER — Inpatient Hospital Stay: Payer: Medicare HMO | Admitting: Oncology

## 2021-06-23 ENCOUNTER — Other Ambulatory Visit: Payer: Medicare HMO

## 2021-06-23 ENCOUNTER — Telehealth: Payer: Self-pay

## 2021-06-23 ENCOUNTER — Inpatient Hospital Stay: Payer: Medicare HMO

## 2021-06-23 ENCOUNTER — Other Ambulatory Visit: Payer: Self-pay

## 2021-06-23 NOTE — Progress Notes (Signed)
Error

## 2021-06-23 NOTE — Telephone Encounter (Signed)
Lannette Donath w/ [facility] calling states they received referral for pt. Lannette Donath believes pt is too high of care for home health but fam/pt is not mentally prepared for hospice yet. Lannette Donath is recommending [facility] team that would make home visits once a week and be able to make medication changes as pt would need. But as pt has been hospitalized so frequently and high of care pt does not meet the need of Home Health.   [employer]: [place], [place] 06/23/21 10:54 AM

## 2021-06-23 NOTE — Telephone Encounter (Signed)
Called [facility]. Priscilla aware of order being placed.   Royce Macadamia, [place] 06/23/21 5:00 PM

## 2021-06-25 ENCOUNTER — Other Ambulatory Visit: Payer: Self-pay | Admitting: Legal Medicine

## 2021-06-25 MED ORDER — OXYCODONE-ACETAMINOPHEN 7.5-325 MG PO TABS: 1.0000 | ORAL_TABLET | Freq: Four times a day (QID) | ORAL | 0 refills | Status: DC | PRN

## 2021-07-03 ENCOUNTER — Other Ambulatory Visit: Payer: Self-pay | Admitting: Family Medicine

## 2021-07-03 ENCOUNTER — Other Ambulatory Visit: Payer: Self-pay | Admitting: Physician Assistant

## 2021-07-05 ENCOUNTER — Ambulatory Visit (INDEPENDENT_AMBULATORY_CARE_PROVIDER_SITE_OTHER): Payer: Medicare HMO

## 2021-07-05 DIAGNOSIS — I420 Dilated cardiomyopathy: Secondary | ICD-10-CM | POA: Diagnosis not present

## 2021-07-06 LAB — CUP PACEART REMOTE DEVICE CHECK
Battery Remaining Longevity: 83 mo
Battery Voltage: 2.98 V
Brady Statistic AP VP Percent: 0.06 %
Brady Statistic AP VS Percent: 97.2 %
Brady Statistic AS VP Percent: 0.01 %
Brady Statistic AS VS Percent: 2.72 %
Brady Statistic RA Percent Paced: 97.25 %
Brady Statistic RV Percent Paced: 0.07 %
Date Time Interrogation Session: 20220927021428
Implantable Lead Implant Date: 20080908
Implantable Lead Implant Date: 20080908
Implantable Lead Implant Date: 20190326
Implantable Lead Location: 753858
Implantable Lead Location: 753860
Implantable Lead Location: 753860
Implantable Lead Model: 3830
Implantable Lead Model: 4194
Implantable Lead Model: 7121
Implantable Pulse Generator Implant Date: 20190326
Lead Channel Impedance Value: 209 Ohm
Lead Channel Impedance Value: 247 Ohm
Lead Channel Impedance Value: 304 Ohm
Lead Channel Impedance Value: 323 Ohm
Lead Channel Impedance Value: 342 Ohm
Lead Channel Impedance Value: 361 Ohm
Lead Channel Impedance Value: 437 Ohm
Lead Channel Impedance Value: 456 Ohm
Lead Channel Impedance Value: 456 Ohm
Lead Channel Pacing Threshold Amplitude: 1.125 V
Lead Channel Pacing Threshold Pulse Width: 0.4 ms
Lead Channel Sensing Intrinsic Amplitude: 5 mV
Lead Channel Sensing Intrinsic Amplitude: 5 mV
Lead Channel Sensing Intrinsic Amplitude: 9 mV
Lead Channel Sensing Intrinsic Amplitude: 9 mV
Lead Channel Setting Pacing Amplitude: 2 V
Lead Channel Setting Pacing Amplitude: 2.5 V
Lead Channel Setting Pacing Pulse Width: 0.4 ms
Lead Channel Setting Sensing Sensitivity: 1.2 mV

## 2021-07-13 NOTE — Progress Notes (Signed)
Remote pacemaker transmission.   

## 2021-07-19 ENCOUNTER — Other Ambulatory Visit: Payer: Self-pay

## 2021-07-19 ENCOUNTER — Other Ambulatory Visit: Payer: Self-pay | Admitting: Family Medicine

## 2021-07-20 ENCOUNTER — Other Ambulatory Visit: Payer: Self-pay | Admitting: Family Medicine

## 2021-07-20 MED ORDER — ISOSORBIDE MONONITRATE ER 30 MG PO TB24: 30.0000 mg | ORAL_TABLET | Freq: Every day | ORAL | 1 refills | Status: AC

## 2021-07-20 MED ORDER — ELIQUIS 2.5 MG PO TABS: 2.5000 mg | ORAL_TABLET | Freq: Two times a day (BID) | ORAL | 1 refills | Status: DC

## 2021-07-20 MED ORDER — POTASSIUM CHLORIDE ER 20 MEQ PO TBCR: 20.0000 meq | EXTENDED_RELEASE_TABLET | Freq: Every day | ORAL | 0 refills | Status: AC

## 2021-07-20 MED ORDER — OMEPRAZOLE 40 MG PO CPDR: 40.0000 mg | DELAYED_RELEASE_CAPSULE | Freq: Every day | ORAL | 3 refills | Status: AC

## 2021-07-21 ENCOUNTER — Encounter: Payer: Self-pay | Admitting: Family Medicine

## 2021-07-21 ENCOUNTER — Other Ambulatory Visit: Payer: Self-pay

## 2021-07-21 ENCOUNTER — Ambulatory Visit (INDEPENDENT_AMBULATORY_CARE_PROVIDER_SITE_OTHER): Payer: Medicare HMO | Admitting: Family Medicine

## 2021-07-21 VITALS — BP 144/76 | HR 64 | Temp 97.3°F | Ht 68.0 in | Wt 154.0 lb

## 2021-07-21 DIAGNOSIS — D6869 Other thrombophilia: Secondary | ICD-10-CM

## 2021-07-21 DIAGNOSIS — E038 Other specified hypothyroidism: Secondary | ICD-10-CM

## 2021-07-21 DIAGNOSIS — I4821 Permanent atrial fibrillation: Secondary | ICD-10-CM | POA: Diagnosis not present

## 2021-07-21 DIAGNOSIS — E559 Vitamin D deficiency, unspecified: Secondary | ICD-10-CM | POA: Diagnosis not present

## 2021-07-21 DIAGNOSIS — Z95810 Presence of automatic (implantable) cardiac defibrillator: Secondary | ICD-10-CM | POA: Diagnosis not present

## 2021-07-21 DIAGNOSIS — E1140 Type 2 diabetes mellitus with diabetic neuropathy, unspecified: Secondary | ICD-10-CM | POA: Diagnosis not present

## 2021-07-21 DIAGNOSIS — I110 Hypertensive heart disease with heart failure: Secondary | ICD-10-CM | POA: Diagnosis not present

## 2021-07-21 DIAGNOSIS — M1712 Unilateral primary osteoarthritis, left knee: Secondary | ICD-10-CM

## 2021-07-21 DIAGNOSIS — Z23 Encounter for immunization: Secondary | ICD-10-CM

## 2021-07-21 DIAGNOSIS — D508 Other iron deficiency anemias: Secondary | ICD-10-CM

## 2021-07-21 DIAGNOSIS — E782 Mixed hyperlipidemia: Secondary | ICD-10-CM | POA: Diagnosis not present

## 2021-07-21 DIAGNOSIS — N184 Chronic kidney disease, stage 4 (severe): Secondary | ICD-10-CM

## 2021-07-21 DIAGNOSIS — E039 Hypothyroidism, unspecified: Secondary | ICD-10-CM | POA: Diagnosis not present

## 2021-07-21 DIAGNOSIS — R413 Other amnesia: Secondary | ICD-10-CM | POA: Diagnosis not present

## 2021-07-21 MED ORDER — DONEPEZIL HCL 5 MG PO TABS: 5.0000 mg | ORAL_TABLET | Freq: Every day | ORAL | 0 refills | Status: AC

## 2021-07-21 NOTE — Assessment & Plan Note (Signed)
Trial on Aricept 5 mg once at night. Patient is not a good candidate for MRI of the brain due to her defibrillator and pacemaker.

## 2021-07-21 NOTE — Assessment & Plan Note (Signed)
Stable.  Check chemistry panel

## 2021-07-21 NOTE — Assessment & Plan Note (Signed)
Rate controlled. Continue Eliquis 2.5 mg twice daily.

## 2021-07-21 NOTE — Assessment & Plan Note (Signed)
Continue current cholesterol medicines which include Lovaza 1 g twice daily, pravastatin 10 mg 4 times a week, Zetia 10 mg daily, and fenofibrate 134 mg once daily.

## 2021-07-21 NOTE — Assessment & Plan Note (Signed)
Risks were discussed including bleeding, infection, increase in sugars if diabetic, and increased pain.  After consent was obtained, using sterile technique the left knee was prepped with  alcohol.  The joint was unable to be entered. I made three attempts with different approaches, but was unable to enter knee joint.   Will refer to orthopedics, Dr. Samule Dry.

## 2021-07-21 NOTE — Assessment & Plan Note (Signed)
Continue Eliquis 2.5 mg twice daily.  Monitor for recurrent GI bleeding.

## 2021-07-21 NOTE — Assessment & Plan Note (Signed)
Check hemoglobin A1c. Continue farxiga 10 mg once daily. Continue to eat healthy.

## 2021-07-21 NOTE — Progress Notes (Signed)
+  Subjective:  Patient ID: [name], female    DOB: Nov 13, 1933  Age: 87 y.o. MRN: [id number]  Chief Complaint  Patient presents with   Hospitalization Follow-up    HPI   Hospital follow up: Patient is an 87 year old white female with history of chronic kidney disease stage IV, chronic systolic congestive heart failure with ejection fraction 35 to 40%, type 2 diabetes, hyperlipidemia, severe mitral regurgitation, atrial fibrillation, and has defibrillator/pacemaker, who was admitted to [facility] on 06/12/2021 and was discharged 06/22/21. Patient was in the hospital for acute on chronic systolic congestive Heart failure with a moderate sized left pleural effusion.  Patient has longstanding iron deficiency anemia and was transfused 1 unit of blood on September 11.  She underwent Lasix IV diuresis.  Patient underwent thoracentesis of left pleural effusion.  Cardiology and gastroenterology were consulted.  After [place] performed a colonoscopy which showed no source of GI bleed.  GI agreed that starting Eliquis 2.5 mg twice daily with close follow-up was appropriate.  Patient was felt to be stable on September 14 and she was discharged home.  Medications that was discontinued were: Amlodipine, Bumetanide, Losartan, Pantoprazole, Spironlactone, Xarelto. New medications include: Eliquis 2.5mg  twice daily, farxiga 10 mg once daily, Lasix 40mg  twice daily, Potassium Chloride 20MEQ once daily, Isosorbide 30mg  once daily.  Patient tries to weigh herself daily.  Her weight is remaining between 150-155.  Patient is eating healthy.  Patient has significant pain in her left knee.  I have given her a knee injection in the past and she is agreeable to this today.  Patient has obvious osteoarthritis with a severe valgus deformity of her left knee.  Patient uses a walker at home.  She and her husband live with her daughter and son-in-law.  Patient's diabetes is excellent.  She is no longer on  insulin for this.  She does take fark [employer] 10 mg once daily.  Her sugars are between 100-110.  Patient has no sores on her feet.  Per the patient and her daughter home health care came out for physical therapy and Occupational Therapy however maximum improvement had been met.  They recommended palliative care.  The family however is not heard from [facility] palliative care program.  Current Outpatient Medications on File Prior to Visit  Medication Sig Dispense Refill   Accu-Chek FastClix Lancets MISC TEST BLOOD SUGAR TWICE DAILY 204 each 3   ACCU-CHEK SMARTVIEW test strip CHECK BLOOD SUGAR TWICE DAILY 200 strip 3   Alcohol Swabs (DROPSAFE ALCOHOL PREP) 70 % PADS USE TWICE DAILY 200 each 3   allopurinol (ZYLOPRIM) 100 MG tablet Take 100 mg by mouth daily.     ALPRAZolam (XANAX) 0.5 MG tablet TAKE ONE TABLET BY MOUTH EVERY NIGHT AT BEDTIME FOR INSOMNIA 30 tablet 0   calcium carbonate (OSCAL) 1500 (600 Ca) MG TABS tablet Take 600 mg of elemental calcium by mouth 2 (two) times daily with a meal.     digoxin (LANOXIN) 0.125 MG tablet Take 0.5 tablets by mouth daily.     DULoxetine (CYMBALTA) 20 MG capsule TAKE 1 CAPSULE BY MOUTH EVERYDAY AT BEDTIME 90 capsule 1   ELIQUIS 2.5 MG TABS tablet Take 1 tablet (2.5 mg total) by mouth 2 (two) times daily. 60 tablet 1   ezetimibe (ZETIA) 10 MG tablet Take 10 mg by mouth daily.     FARXIGA 10 MG TABS tablet TAKE 1 TABLET BY MOUTH EVERY DAY 30 tablet 0   fenofibrate micronized (  LOFIBRA) 134 MG capsule TAKE 1 CAPSULE (134 MG TOTAL) BY MOUTH DAILY. 90 capsule 1   fluticasone (FLONASE) 50 MCG/ACT nasal spray Place 2 sprays into both nostrils daily as needed for allergies or rhinitis.     furosemide (LASIX) 40 MG tablet TAKE 1 TABLET BY MOUTH AT 0800 AND 1600 60 tablet 0   isosorbide mononitrate (IMDUR) 30 MG 24 hr tablet Take 1 tablet (30 mg total) by mouth daily. 90 tablet 1   levothyroxine (SYNTHROID) 50 MCG tablet Take 50 mcg by mouth daily before  breakfast.     Multiple Vitamin (MULTIVITAMIN WITH MINERALS) TABS tablet Take 1 tablet by mouth daily. Centrum Silver     Naphazoline-Pheniramine (OPCON-A) 0.027-0.315 % SOLN Apply 1 drop to eye in the morning and at bedtime.     nystatin cream (MYCOSTATIN) Apply 1 application topically 3 (three) times daily. Cream to back rash     olopatadine (PATANOL) 0.1 % ophthalmic solution Place 1 drop into both eyes 2 (two) times daily.     omega-3 acid ethyl esters (LOVAZA) 1 g capsule Take 1 g by mouth 2 (two) times daily.     omeprazole (PRILOSEC) 40 MG capsule Take 1 capsule (40 mg total) by mouth daily. 90 capsule 3   oxyCODONE-acetaminophen (PERCOCET) 7.5-325 MG tablet Take 1 tablet by mouth every 6 (six) hours as needed (back pain.). 120 tablet 0   polyethylene glycol (MIRALAX / GLYCOLAX) 17 g packet Take 17 g by mouth daily as needed for constipation.     Potassium Chloride ER 20 MEQ TBCR Take 20 mEq by mouth daily. 60 tablet 0   pravastatin (PRAVACHOL) 10 MG tablet Take 10 mg by mouth 4 (four) times a week.     rOPINIRole (REQUIP) 1 MG tablet Take 1 mg by mouth at bedtime.     senna (SENOKOT) 8.6 MG TABS tablet Take 2 tablets by mouth daily.     tiZANidine (ZANAFLEX) 4 MG tablet Take 4 mg by mouth at bedtime.     No current facility-administered medications on file prior to visit.   Past Medical History:  Diagnosis Date   Acute gastritis with hemorrhage 09/06/2020   Amiodarone pulmonary toxicity    Anemia    no GI bleeding; on iron   Arthritis    Atrial fibrillation (HCC)    Automatic implantable cardioverter-defibrillator in situ 12/26/2010   Qualifier: Diagnosis of  By: Ladona Ridgel, MD, Ruthann Cancer Vergia Alcon    Cardiomyopathy [facility]) 12/23/2010   Qualifier: Diagnosis of  By: Susette Racer CMA, Jewel    Overview:  Overview:  Qualifier: Diagnosis of  By: Susette Racer CMA, Jewel    Carotid artery stenosis 09/24/2018   CHF (congestive heart failure) (HCC)    Chronic a-fib (HCC)    on xarelto; failed DCCV    Chronic kidney disease, stage IV (severe) (HCC)    Chronic systolic heart failure (HCC) 12/26/2010   Qualifier: Diagnosis of  By: Ladona Ridgel, MD, Jerrell Mylar   Overview:  Overview:  Qualifier: Diagnosis of  By: Ladona Ridgel, MD, Jerrell Mylar  Last Assessment & Plan:  Her symptoms remain class II. No change in medications. She will maintain a low-sodium diet.   Complete atrioventricular block (HCC) 12/23/2010   Overview:  Overview:  Qualifier: Diagnosis of  By: Susette Racer CMA, Jewel    Dyslipidemia associated with type 2 diabetes mellitus (HCC) 12/25/2019   Fibromyalgia    Full dentures    GAD (generalized anxiety disorder)    GERD (gastroesophageal reflux disease)  H/O cardiovascular stress test 08/04/2010   normal imaging; EF 61%   H/O echocardiogram 07/05/2010   EF 45-50%; aortic valve-mildly sclerotic; LA-mod dilaterd    Hallux limitus of right foot 06/27/2016   HFrEF (heart failure with reduced ejection fraction) ([place]) 01/13/2021   Hyperlipemia    Hypertensive heart disease with heart failure ([place])    Hypothyroidism    ICD (implantable cardioverter-defibrillator), biventricular, in situ 12/23/2010   Annotation:  Medtronic Qualifier: Diagnosis of  By: Selena Batten CMA, Jewel    Overview:  Overview:  Annotation:  Medtronic Qualifier: Diagnosis of  By: Selena Batten CMA, Jewel   Last Assessment & Plan:  Her device is working normally. No diaphragmatic stimulation. Will follow.    Iron deficiency anemia 09/06/2020   LBBB (left bundle branch block)    Mixed hyperlipidemia    Morbid obesity ([place]) 01/13/2021   NSVT (nonsustained ventricular tachycardia) 09/18/2018   Occlusion and stenosis of right carotid artery    Pneumonia    Presence of automatic (implantable) cardiac defibrillator    Primary osteoarthritis of left knee 12/01/2019   PVD (peripheral vascular disease) ([place])    L RA stent 1994   RAS (renal artery stenosis) ([place])    a. renal dopp (12/18/08)- Right RA-<60%; L RA stent- open; nl size  and shape in both kidneys;  b.  Rena Artery [name] (3/16):  SMA with > 70% stenosis, Bilateral prox RA with 1-59%   Restless leg syndrome    Secondary hypothyroidism 12/25/2019   Type 2 diabetes mellitus with diabetic neuropathy ([place])    VENTRICULAR FIBRILLATION 12/26/2010   Qualifier: Diagnosis of  By: Lovena Le, MD, Martyn Malay    VT (ventricular tachycardia) 06/2010   polymorphic VT probably related to sotalol therapy   Wears glasses    Past Surgical History:  Procedure Laterality Date   ABDOMINAL HYSTERECTOMY     APPENDECTOMY     AV NODE ABLATION  06/08/2006   performed by Dr. Beckie Salts; due to Afib with RVR and tachy brady syndrome   BIV ICD GENERATOR CHANGEOUT N/A 12/31/2017   Procedure: [place];  Surgeon: Evans Lance, MD;  Location: [facility];  Service: Cardiovascular;  Laterality: N/A;   BIV ICD GENERTAOR CHANGE OUT  11/15/10   Medtronic Protecta [id number] serial #[id number]   BiV ICD Placement  06/17/2007; 04/20/14   Medtronic Concerta [id number]; RA lead-Med 5076/53cm, [id number]; RV lead- SJM 7121/65cm, [id number]; LV lead- Med 4194/88cm, [id number]; gen change 04/2014 by Dr Lovena Le   BIV PACEMAKER GENERATOR CHANGE OUT N/A 04/20/2014   Procedure: BIV PACEMAKER GENERATOR CHANGE OUT;  Surgeon: Evans Lance, MD;  Location: [facility] CATH LAB;  Service: Cardiovascular;  Laterality: N/A;   BRAIN MENINGIOMA EXCISION  2011   L frontal meningioma at [place]     lumpectomy right   CARDIOVERSION  07/21/05   converted to sinus brady   CAROTID ENDARTERECTOMY Right 09/24/2018   with bovine pericardial patch angioplasty   CATARACT EXTRACTION W/ INTRAOCULAR LENS  IMPLANT, BILATERAL     CHOLECYSTECTOMY     Dr. Sherald Hess in [place] and Dr. Lyda Jester follows; surgical stricture post procedure with stent placement   COLONOSCOPY W/ BIOPSIES AND POLYPECTOMY     DILATION AND CURETTAGE OF UTERUS     ENDARTERECTOMY Right 09/24/2018   Procedure:  ENDARTERECTOMY CAROTID RIGHT;  Surgeon: Angelia Mould, MD;  Location: [facility];  Service: Vascular;  Laterality: Right;   [place]  L Renal artery stent     Family History  Problem Relation Age of Onset   Heart failure Mother    Hypertension Mother    Cancer Father        lung   Dementia Sister    Cancer Sister        pancreatic, renal and thyroid   Heart attack Brother    AAA (abdominal aortic aneurysm) Brother    Stroke Maternal Grandmother    Diabetes Other    Stroke Son    Social History   Socioeconomic History   Marital status: Married    Spouse name: Not on file   Number of children: 6   Years of education: Not on file   Highest education level: Not on file  Occupational History   Not on file  Tobacco Use   Smoking status: Never   Smokeless tobacco: Never  Vaping Use   Vaping Use: Never used  Substance and Sexual Activity   Alcohol use: No    Alcohol/week: 0.0 standard drinks   Drug use: No   Sexual activity: Not Currently  Other Topics Concern   Not on file  Social History Narrative   wears sunscreen, brushes and flosses daily, see's dentist bi-annually, has smoke/carbon monoxide detectors, wears a seatbelt and practices gun safety   Social Determinants of Health   Financial Resource Strain: Not on file  Food Insecurity: Not on file  Transportation Needs: Not on file  Physical Activity: Not on file  Stress: Not on file  Social Connections: Not on file    Review of Systems  Constitutional:  Negative for chills, fatigue and fever.  HENT:  Negative for congestion, ear pain, sinus pressure and sore throat.   Eyes:  Negative for pain.  Respiratory:  Negative for cough, chest tightness, shortness of breath and wheezing.   Cardiovascular:  Negative for chest pain and palpitations.  Gastrointestinal:  Negative for abdominal pain, constipation, diarrhea, nausea and vomiting.  Genitourinary:  Negative for dysuria and hematuria.  Musculoskeletal:   Positive for back pain and myalgias (left knee). Negative for arthralgias and joint swelling.  Skin:  Negative for rash.  Neurological:  Positive for weakness. Negative for dizziness and headaches.       Memory loss  Psychiatric/Behavioral:  Negative for dysphoric mood. The patient is not nervous/anxious.     Objective:  BP (!) 144/76 (BP Location: Right Arm, Patient Position: Sitting)   Pulse 64   Temp (!) 97.3 F (36.3 C) (Temporal)   Ht $R'5\' 8"'Dw$  (1.727 m)   Wt 154 lb (69.9 kg)   SpO2 97%   BMI 23.42 kg/m   BP/Weight 07/21/2021 05/05/2021 2/95/7473  Systolic BP 403 709 643  Diastolic BP 76 46 65  Wt. (Lbs) 154 160 160  BMI 23.42 24.33 24.33    Physical Exam Vitals reviewed.  Constitutional:      Appearance: Normal appearance. She is obese.  Neck:     Vascular: No carotid bruit.  Cardiovascular:     Rate and Rhythm: Normal rate. Rhythm irregular.     Heart sounds: Normal heart sounds.  Pulmonary:     Effort: Pulmonary effort is normal. No respiratory distress.     Breath sounds: Normal breath sounds.  Abdominal:     General: Abdomen is flat. Bowel sounds are normal.     Palpations: Abdomen is soft.     Tenderness: There is no abdominal tenderness.  Musculoskeletal:        General: Tenderness (  left knee) present.     Comments: severe valgus deformity of her left knee.   Neurological:     Mental Status: She is alert and oriented to person, place, and time.  Psychiatric:        Mood and Affect: Mood normal.        Behavior: Behavior normal.    Diabetic Foot Exam - Simple   Simple Foot Form Diabetic Foot exam was performed with the following findings: Yes 07/21/2021 10:11 PM  Visual Inspection No deformities, no ulcerations, no other skin breakdown bilaterally: Yes Sensation Testing See comments: Yes Pulse Check Posterior Tibialis and Dorsalis pulse intact bilaterally: Yes Comments Some mild decrease sensation bilateral feet.  Patient has 1+ edema bilateral feet  left slightly greater than right which is as its been chronically.      Lab Results  Component Value Date   WBC 5.0 05/04/2021   HGB 6.7 (A) 05/04/2021   HCT 21 (A) 05/04/2021   PLT 366 05/04/2021   GLUCOSE 89 03/01/2021   CHOL 92 (L) 03/01/2021   TRIG 39 03/01/2021   HDL 36 (L) 03/01/2021   LDLCALC 45 03/01/2021   ALT 15 03/01/2021   AST 26 03/01/2021   NA 134 03/01/2021   K 4.7 03/01/2021   CL 96 03/01/2021   CREATININE 2.43 (H) 03/01/2021   BUN 87 (HH) 03/01/2021   CO2 20 03/01/2021   TSH 2.440 05/25/2020   INR 0.96 09/24/2018   HGBA1C 5.0 12/03/2020   MICROALBUR 10 12/25/2019      Assessment & Plan:   Problem List Items Addressed This Visit       Cardiovascular and Mediastinum   ATRIAL FIBRILLATION    Rate controlled. Continue Eliquis 2.5 mg twice daily.      Relevant Orders   Ambulatory referral to Hospice   Hypertensive heart disease with heart failure ([place])    Hypertension slightly elevated. Congestive heart failure appears to be compensated. Continue digoxin 0.125 mg half pill daily, agree with starting Farxiga 10 mg daily, continue Lasix 40 mg twice daily, continue Imdur 30 mg once daily. Patient to follow-up with cardiology.      Relevant Orders   CBC with Differential/Platelet   Comprehensive metabolic panel   Ambulatory referral to Hospice     Endocrine   Secondary hypothyroidism    Check TSH.      RESOLVED: Hypothyroidism   Relevant Orders   TSH   Hemoglobin A1c   Type 2 diabetes mellitus with diabetic neuropathy (HCC) - Primary    Check hemoglobin A1c. Continue farxiga 10 mg once daily. Continue to eat healthy.      Relevant Orders   Ambulatory referral to Hospice     Musculoskeletal and Integument   Localized osteoarthritis of left knee    Risks were discussed including bleeding, infection, increase in sugars if diabetic, and increased pain.  After consent was obtained, using sterile technique the left knee was prepped with   alcohol.  The joint was unable to be entered. I made three attempts with different approaches, but was unable to enter knee joint.   Will refer to orthopedics, Dr. Samule Dry.      Relevant Orders   Ambulatory referral to Orthopedic Surgery     Genitourinary   Chronic kidney disease, stage 4 (severe) (HCC)    Stable.  Check chemistry panel        Hematopoietic and Hemostatic   Acquired thrombophilia (HCC)    Continue Eliquis 2.5 mg twice daily.  Monitor for recurrent GI bleeding.        Other   Mixed hyperlipidemia    Continue current cholesterol medicines which include Lovaza 1 g twice daily, pravastatin 10 mg 4 times a week, Zetia 10 mg daily, and fenofibrate 134 mg once daily.      RESOLVED: Presence of automatic (implantable) cardiac defibrillator   Memory loss    Trial on Aricept 5 mg once at night. Patient is not a good candidate for MRI of the brain due to her defibrillator and pacemaker.      Relevant Orders   Ambulatory referral to Hospice   RESOLVED: Vitamin D deficiency   Iron deficiency anemia   ICD (implantable cardioverter-defibrillator), biventricular, in situ   Other Visit Diagnoses     Need for immunization against influenza       Relevant Orders   Flu Vaccine QUAD High Dose(Fluad) (Completed)     .  Meds ordered this encounter  Medications   donepezil (ARICEPT) 5 MG tablet    Sig: Take 1 tablet (5 mg total) by mouth at bedtime.    Dispense:  90 tablet    Refill:  0    Orders Placed This Encounter  Procedures   Flu Vaccine QUAD High Dose(Fluad)   CBC with Differential/Platelet   Comprehensive metabolic panel   TSH   Hemoglobin A1c   Ambulatory referral to Hospice   Ambulatory referral to Orthopedic Surgery     Follow-up: Return in about 6 weeks (around 09/01/2021).  I spent 60 minutes face-to-face with the patient combined with chart review. An After Visit Summary was printed and given to the patient.   I,Lauren M Auman,acting as a  scribe for [name], MD.,have documented all relevant documentation on the behalf of [name], MD,as directed by  [name], MD while in the presence of [name], MD.    [name], MD [place] [phone]

## 2021-07-21 NOTE — Assessment & Plan Note (Signed)
Check TSH 

## 2021-07-21 NOTE — Assessment & Plan Note (Addendum)
Hypertension slightly elevated. Congestive heart failure appears to be compensated. Continue digoxin 0.125 mg half pill daily, agree with starting Farxiga 10 mg daily, continue Lasix 40 mg twice daily, continue Imdur 30 mg once daily. Patient to follow-up with cardiology.

## 2021-07-22 ENCOUNTER — Ambulatory Visit: Payer: Medicare HMO | Admitting: Cardiology

## 2021-07-22 LAB — TSH: TSH: 1.49 u[IU]/mL (ref 0.450–4.500)

## 2021-07-22 LAB — CBC WITH DIFFERENTIAL/PLATELET
Basophils Absolute: 0.1 10*3/uL (ref 0.0–0.2)
Basos: 2 %
EOS (ABSOLUTE): 0.2 10*3/uL (ref 0.0–0.4)
Eos: 4 %
Hematocrit: 29.1 % — ABNORMAL LOW (ref 34.0–46.6)
Hemoglobin: 9.1 g/dL — ABNORMAL LOW (ref 11.1–15.9)
Immature Grans (Abs): 0 10*3/uL (ref 0.0–0.1)
Immature Granulocytes: 0 %
Lymphocytes Absolute: 0.8 10*3/uL (ref 0.7–3.1)
Lymphs: 18 %
MCH: 28.3 pg (ref 26.6–33.0)
MCHC: 31.3 g/dL — ABNORMAL LOW (ref 31.5–35.7)
MCV: 91 fL (ref 79–97)
Monocytes Absolute: 0.5 10*3/uL (ref 0.1–0.9)
Monocytes: 11 %
Neutrophils Absolute: 3 10*3/uL (ref 1.4–7.0)
Neutrophils: 65 %
Platelets: 309 10*3/uL (ref 150–450)
RBC: 3.21 x10E6/uL — ABNORMAL LOW (ref 3.77–5.28)
RDW: 16.1 % — ABNORMAL HIGH (ref 11.7–15.4)
WBC: 4.5 10*3/uL (ref 3.4–10.8)

## 2021-07-22 LAB — COMPREHENSIVE METABOLIC PANEL
ALT: 10 IU/L (ref 0–32)
AST: 21 IU/L (ref 0–40)
Albumin/Globulin Ratio: 2.2 (ref 1.2–2.2)
Albumin: 4 g/dL (ref 3.6–4.6)
Alkaline Phosphatase: 44 IU/L (ref 44–121)
BUN/Creatinine Ratio: 24 (ref 12–28)
BUN: 37 mg/dL — ABNORMAL HIGH (ref 8–27)
Bilirubin Total: 0.4 mg/dL (ref 0.0–1.2)
CO2: 26 mmol/L (ref 20–29)
Calcium: 9.6 mg/dL (ref 8.7–10.3)
Chloride: 102 mmol/L (ref 96–106)
Creatinine, Ser: 1.57 mg/dL — ABNORMAL HIGH (ref 0.57–1.00)
Globulin, Total: 1.8 g/dL (ref 1.5–4.5)
Glucose: 93 mg/dL (ref 70–99)
Potassium: 4.8 mmol/L (ref 3.5–5.2)
Sodium: 140 mmol/L (ref 134–144)
Total Protein: 5.8 g/dL — ABNORMAL LOW (ref 6.0–8.5)
eGFR: 32 mL/min/{1.73_m2} — ABNORMAL LOW (ref >59)

## 2021-07-22 LAB — HEMOGLOBIN A1C
Est. average glucose Bld gHb Est-mCnc: 100 mg/dL
Hgb A1c MFr Bld: 5.1 % (ref 4.8–5.6)

## 2021-07-23 DIAGNOSIS — E1122 Type 2 diabetes mellitus with diabetic chronic kidney disease: Secondary | ICD-10-CM | POA: Diagnosis not present

## 2021-07-23 DIAGNOSIS — J90 Pleural effusion, not elsewhere classified: Secondary | ICD-10-CM | POA: Diagnosis not present

## 2021-07-23 DIAGNOSIS — J181 Lobar pneumonia, unspecified organism: Secondary | ICD-10-CM | POA: Diagnosis not present

## 2021-07-23 DIAGNOSIS — I130 Hypertensive heart and chronic kidney disease with heart failure and stage 1 through stage 4 chronic kidney disease, or unspecified chronic kidney disease: Secondary | ICD-10-CM | POA: Diagnosis not present

## 2021-07-23 DIAGNOSIS — I482 Chronic atrial fibrillation, unspecified: Secondary | ICD-10-CM | POA: Diagnosis not present

## 2021-07-23 DIAGNOSIS — D631 Anemia in chronic kidney disease: Secondary | ICD-10-CM | POA: Diagnosis not present

## 2021-07-23 DIAGNOSIS — N184 Chronic kidney disease, stage 4 (severe): Secondary | ICD-10-CM | POA: Diagnosis not present

## 2021-07-23 DIAGNOSIS — I110 Hypertensive heart disease with heart failure: Secondary | ICD-10-CM | POA: Diagnosis not present

## 2021-07-23 DIAGNOSIS — I5023 Acute on chronic systolic (congestive) heart failure: Secondary | ICD-10-CM | POA: Diagnosis not present

## 2021-07-23 DIAGNOSIS — R0689 Other abnormalities of breathing: Secondary | ICD-10-CM | POA: Diagnosis not present

## 2021-07-23 DIAGNOSIS — R0600 Dyspnea, unspecified: Secondary | ICD-10-CM | POA: Diagnosis not present

## 2021-07-23 DIAGNOSIS — J9811 Atelectasis: Secondary | ICD-10-CM | POA: Diagnosis not present

## 2021-07-23 DIAGNOSIS — I509 Heart failure, unspecified: Secondary | ICD-10-CM | POA: Diagnosis not present

## 2021-07-23 DIAGNOSIS — J189 Pneumonia, unspecified organism: Secondary | ICD-10-CM | POA: Diagnosis not present

## 2021-07-23 DIAGNOSIS — R0602 Shortness of breath: Secondary | ICD-10-CM | POA: Diagnosis not present

## 2021-07-23 DIAGNOSIS — I428 Other cardiomyopathies: Secondary | ICD-10-CM | POA: Diagnosis not present

## 2021-07-23 DIAGNOSIS — I3139 Other pericardial effusion (noninflammatory): Secondary | ICD-10-CM | POA: Diagnosis not present

## 2021-07-23 DIAGNOSIS — I700 Atherosclerosis of aorta: Secondary | ICD-10-CM | POA: Diagnosis not present

## 2021-07-25 ENCOUNTER — Telehealth: Payer: Medicare HMO

## 2021-07-26 ENCOUNTER — Other Ambulatory Visit: Payer: Self-pay

## 2021-07-26 DIAGNOSIS — I5022 Chronic systolic (congestive) heart failure: Secondary | ICD-10-CM

## 2021-07-27 ENCOUNTER — Telehealth: Payer: Self-pay

## 2021-07-27 ENCOUNTER — Telehealth: Payer: Self-pay | Admitting: *Deleted

## 2021-07-27 DIAGNOSIS — I5023 Acute on chronic systolic (congestive) heart failure: Secondary | ICD-10-CM | POA: Diagnosis not present

## 2021-07-27 DIAGNOSIS — G2581 Restless legs syndrome: Secondary | ICD-10-CM | POA: Diagnosis not present

## 2021-07-27 DIAGNOSIS — I428 Other cardiomyopathies: Secondary | ICD-10-CM | POA: Diagnosis not present

## 2021-07-27 DIAGNOSIS — K219 Gastro-esophageal reflux disease without esophagitis: Secondary | ICD-10-CM | POA: Diagnosis not present

## 2021-07-27 DIAGNOSIS — I130 Hypertensive heart and chronic kidney disease with heart failure and stage 1 through stage 4 chronic kidney disease, or unspecified chronic kidney disease: Secondary | ICD-10-CM | POA: Diagnosis not present

## 2021-07-27 DIAGNOSIS — N184 Chronic kidney disease, stage 4 (severe): Secondary | ICD-10-CM | POA: Diagnosis not present

## 2021-07-27 DIAGNOSIS — M1712 Unilateral primary osteoarthritis, left knee: Secondary | ICD-10-CM | POA: Diagnosis not present

## 2021-07-27 DIAGNOSIS — E1122 Type 2 diabetes mellitus with diabetic chronic kidney disease: Secondary | ICD-10-CM | POA: Diagnosis not present

## 2021-07-27 DIAGNOSIS — D631 Anemia in chronic kidney disease: Secondary | ICD-10-CM | POA: Diagnosis not present

## 2021-07-27 MED ORDER — OXYCODONE-ACETAMINOPHEN 7.5-325 MG PO TABS: 1.0000 | ORAL_TABLET | Freq: Four times a day (QID) | ORAL | 0 refills | Status: AC | PRN

## 2021-07-27 NOTE — Chronic Care Management (AMB) (Signed)
  Chronic Care Management   Note  07/27/2021 Name: Blakelyn Dinges MRN: [id number] DOB: Mar 13, 1934  Caleb Popp Sanela Evola is a 87 y.o. year old female who is a primary care patient of Cox, Kirsten, MD. Day Greb Reesha Debes is currently enrolled in care management services. An additional referral for RNCM was placed.   Follow up plan: Telephone appointment with care management team member scheduled for:08/11/21  [facility] [facility]  Direct Dial: [phone]

## 2021-07-27 NOTE — Telephone Encounter (Signed)
  Transition Care Management Follow-up Telephone Call    [name] 1934/08/29  Admit Date: 07/23/2021 Discharge Date: 07/25/2021 Discharged from where: [facility]  Diagnoses:(I50.23) Acute on Chronic systolic congestive Heart Failure -(D64.9) Anemia   2 day post discharge: [facility] Phone call with patient   [name] was discharged from [facility] on 07/25/2021 with the diagnoses listed above. She was contacted today via telephone in regards to transition of care.     Discharge Instructions: Follow up with PCP in 1 week, still continue fluid and sodium restricted diet. Patient was also give medications including Metalozone 2.5mg , cefdinir 300mg  1 tablet BID x5 days. Also her Carvedilol was changed to 3.125 g 1 tablet BID.  Items Reviewed: Did the pt receive and understand the discharge instructions provided? Yes  Medications obtained and verified? Yes  Other? No  Any new allergies since your discharge? No  Dietary orders reviewed? Yes Do you have support at home? Yes   Home Care and Equipment/Supplies: Were home health services ordered? yes If so, what is the name of the agency? [facility] set up a time to come to the patient's home? yes Were any new equipment or medical supplies ordered?  No     Follow up appointments reviewed: Patient is scheduled for follow up with Dr. Tobie Poet on 08/01/2021. If their condition worsens, is the pt aware to call PCP or go to the Emergency Dept.? Yes Was the patient provided with contact information for the PCP's office/after hours number? Yes Was to pt encouraged to call back with questions or concerns? Randol Kern [facility]  07/27/21 4:35 PM

## 2021-07-28 ENCOUNTER — Telehealth: Payer: Self-pay

## 2021-07-28 DIAGNOSIS — E1122 Type 2 diabetes mellitus with diabetic chronic kidney disease: Secondary | ICD-10-CM | POA: Diagnosis not present

## 2021-07-28 DIAGNOSIS — I428 Other cardiomyopathies: Secondary | ICD-10-CM | POA: Diagnosis not present

## 2021-07-28 DIAGNOSIS — I5023 Acute on chronic systolic (congestive) heart failure: Secondary | ICD-10-CM | POA: Diagnosis not present

## 2021-07-28 DIAGNOSIS — G2581 Restless legs syndrome: Secondary | ICD-10-CM | POA: Diagnosis not present

## 2021-07-28 DIAGNOSIS — I130 Hypertensive heart and chronic kidney disease with heart failure and stage 1 through stage 4 chronic kidney disease, or unspecified chronic kidney disease: Secondary | ICD-10-CM | POA: Diagnosis not present

## 2021-07-28 DIAGNOSIS — N184 Chronic kidney disease, stage 4 (severe): Secondary | ICD-10-CM | POA: Diagnosis not present

## 2021-07-28 DIAGNOSIS — D631 Anemia in chronic kidney disease: Secondary | ICD-10-CM | POA: Diagnosis not present

## 2021-07-28 DIAGNOSIS — M1712 Unilateral primary osteoarthritis, left knee: Secondary | ICD-10-CM | POA: Diagnosis not present

## 2021-07-28 DIAGNOSIS — K219 Gastro-esophageal reflux disease without esophagitis: Secondary | ICD-10-CM | POA: Diagnosis not present

## 2021-07-28 NOTE — Chronic Care Management (AMB) (Signed)
Chronic Care Management Pharmacy Assistant   Name: [name]  MRN: [id number] DOB: Oct 06, 1934   Reason for Encounter: Disease State call for DM    Recent office visits:  07/21/21 [name] MD. Seen for Hospital F/U. Referral to hospice and Orthopedic surgery. Started on Donepezil HCI 5 mg at bedtime. D/C Amlodipine Besylate 5 mg daily. D/C Bumetanide 2 mg 2 times daily, Losartan 100 mg daily, Rivaroxaban 15 mg daily, Spironolactone 12.5 daily.  07/20/21 Orders Only. D/C Pantoprazole 40 mg and Ordered Omeprazole 40 mg daily.  07/19/21 Orders Only. D/C the following meds: amLODipine (NORVASC) 5 MG tablet, digoxin (LANOXIN) 0.125 MG tablet,ezetimibe (ZETIA) 10 MG tablet,ferumoxytol (FERAHEME) 510 MG/17ML SOLN injection, fluticasone (FLONASE) 50 MCG/ACT nasal spray, levothyroxine (SYNTHROID) 50 MCG tablet, losartan (COZAAR) 100 MG tablet, omega-3 acid ethyl esters (LOVAZA) 1 g capsule, pravastatin (PRAVACHOL) 10 MG tablet, rOPINIRole (REQUIP) 1 MG tablet, spironolactone (ALDACTONE) 25 MG tablet, tiZANidine (ZANAFLEX) 4 MG tablet, XARELTO 15 MG TABS tablet  Recent consult visits:  07/05/21 (Cardiology) Roma Kayser RN. Seen for Dilated Cardiomyopathy. No med changes.  Hospital visits:  05/31/21 Ed visit - No notes available    Medications: Outpatient Encounter Medications as of 07/28/2021  Medication Sig   Accu-Chek FastClix Lancets MISC TEST BLOOD SUGAR TWICE DAILY   ACCU-CHEK SMARTVIEW test strip CHECK BLOOD SUGAR TWICE DAILY   Alcohol Swabs (DROPSAFE ALCOHOL PREP) 70 % PADS USE TWICE DAILY   allopurinol (ZYLOPRIM) 100 MG tablet Take 100 mg by mouth daily.   ALPRAZolam (XANAX) 0.5 MG tablet TAKE ONE TABLET BY MOUTH EVERY NIGHT AT BEDTIME FOR INSOMNIA   calcium carbonate (OSCAL) 1500 (600 Ca) MG TABS tablet Take 600 mg of elemental calcium by mouth 2 (two) times daily with a meal.   digoxin (LANOXIN) 0.125 MG tablet Take 0.5 tablets by mouth daily.    donepezil (ARICEPT) 5 MG tablet Take 1 tablet (5 mg total) by mouth at bedtime.   DULoxetine (CYMBALTA) 20 MG capsule TAKE 1 CAPSULE BY MOUTH EVERYDAY AT BEDTIME   ELIQUIS 2.5 MG TABS tablet Take 1 tablet (2.5 mg total) by mouth 2 (two) times daily.   ezetimibe (ZETIA) 10 MG tablet Take 10 mg by mouth daily.   FARXIGA 10 MG TABS tablet TAKE 1 TABLET BY MOUTH EVERY DAY   fenofibrate micronized (LOFIBRA) 134 MG capsule TAKE 1 CAPSULE (134 MG TOTAL) BY MOUTH DAILY.   fluticasone (FLONASE) 50 MCG/ACT nasal spray Place 2 sprays into both nostrils daily as needed for allergies or rhinitis.   furosemide (LASIX) 40 MG tablet TAKE 1 TABLET BY MOUTH AT 0800 AND 1600   isosorbide mononitrate (IMDUR) 30 MG 24 hr tablet Take 1 tablet (30 mg total) by mouth daily.   levothyroxine (SYNTHROID) 50 MCG tablet Take 50 mcg by mouth daily before breakfast.   Multiple Vitamin (MULTIVITAMIN WITH MINERALS) TABS tablet Take 1 tablet by mouth daily. Centrum Silver   Naphazoline-Pheniramine (OPCON-A) 0.027-0.315 % SOLN Apply 1 drop to eye in the morning and at bedtime.   nystatin cream (MYCOSTATIN) Apply 1 application topically 3 (three) times daily. Cream to back rash   olopatadine (PATANOL) 0.1 % ophthalmic solution Place 1 drop into both eyes 2 (two) times daily.   omega-3 acid ethyl esters (LOVAZA) 1 g capsule Take 1 g by mouth 2 (two) times daily.   omeprazole (PRILOSEC) 40 MG capsule Take 1 capsule (40 mg total) by mouth daily.   oxyCODONE-acetaminophen (PERCOCET) 7.5-325 MG tablet Take 1 tablet  by mouth every 6 (six) hours as needed (back pain.).   polyethylene glycol (MIRALAX / GLYCOLAX) 17 g packet Take 17 g by mouth daily as needed for constipation.   Potassium Chloride ER 20 MEQ TBCR Take 20 mEq by mouth daily.   pravastatin (PRAVACHOL) 10 MG tablet Take 10 mg by mouth 4 (four) times a week.   rOPINIRole (REQUIP) 1 MG tablet Take 1 mg by mouth at bedtime.   senna (SENOKOT) 8.6 MG TABS tablet Take 2 tablets by  mouth daily.   tiZANidine (ZANAFLEX) 4 MG tablet Take 4 mg by mouth at bedtime.   No facility-administered encounter medications on file as of 07/28/2021.    Recent Relevant Labs: Lab Results  Component Value Date/Time   HGBA1C 5.1 07/21/2021 02:30 PM   HGBA1C 5.0 12/03/2020 11:53 AM   MICROALBUR 10 12/25/2019 12:45 PM    Kidney Function Lab Results  Component Value Date/Time   CREATININE 1.57 (H) 07/21/2021 02:30 PM   CREATININE 2.43 (H) 03/01/2021 11:33 AM   GFR 38.32 (L) 11/30/2014 12:13 PM   GFRNONAA 34 (L) 12/03/2020 11:45 AM   GFRAA 39 (L) 12/03/2020 11:45 AM     Current antihyperglycemic regimen:  Farxiga 10 mg daily    Patient verbally confirms she is taking the above medications as directed. Yes  What recent interventions/DTPs have been made to improve glycemic control:  Pt stated no changes   Have there been any recent hospitalizations or ED visits since last visit with CPP? Yes  Patient reports hypoglycemic symptoms, including None  Patient denies hyperglycemic symptoms, including none  How often are you checking your blood sugar? once daily  What are your blood sugars ranging? Pt stated she had no readings to give me. She has been in the hospital and not feeling too good  On insulin? No  During the week, how often does your blood glucose drop below 70? Never  Are you checking your feet daily/regularly? Yes  Adherence Review: Is the patient currently on a STATIN medication? Yes Is the patient currently on ACE/ARB medication? No Does the patient have >5 day gap between last estimated fill dates?   Care Gaps: Last eye exam / Retinopathy Screening? Never done  Last Annual Wellness Visit? 11/18/19 Last Diabetic Foot Exam? 07/21/21   Star Rating Drugs:  Medication:  Last Fill: Day Supply Farixga   06/22/21 30ds    Elray Mcgregor, [place] Pharmacist Assistant  [phone]

## 2021-07-29 DIAGNOSIS — I428 Other cardiomyopathies: Secondary | ICD-10-CM | POA: Diagnosis not present

## 2021-07-29 DIAGNOSIS — D631 Anemia in chronic kidney disease: Secondary | ICD-10-CM | POA: Diagnosis not present

## 2021-07-29 DIAGNOSIS — I639 Cerebral infarction, unspecified: Secondary | ICD-10-CM | POA: Diagnosis not present

## 2021-07-29 DIAGNOSIS — K219 Gastro-esophageal reflux disease without esophagitis: Secondary | ICD-10-CM | POA: Diagnosis not present

## 2021-07-29 DIAGNOSIS — I6300 Cerebral infarction due to thrombosis of unspecified precerebral artery: Secondary | ICD-10-CM | POA: Diagnosis not present

## 2021-07-29 DIAGNOSIS — R531 Weakness: Secondary | ICD-10-CM | POA: Diagnosis not present

## 2021-07-29 DIAGNOSIS — I517 Cardiomegaly: Secondary | ICD-10-CM | POA: Diagnosis not present

## 2021-07-29 DIAGNOSIS — I509 Heart failure, unspecified: Secondary | ICD-10-CM | POA: Diagnosis not present

## 2021-07-29 DIAGNOSIS — M1712 Unilateral primary osteoarthritis, left knee: Secondary | ICD-10-CM | POA: Diagnosis not present

## 2021-07-29 DIAGNOSIS — R4781 Slurred speech: Secondary | ICD-10-CM | POA: Diagnosis not present

## 2021-07-29 DIAGNOSIS — U071 COVID-19: Secondary | ICD-10-CM | POA: Diagnosis not present

## 2021-07-29 DIAGNOSIS — R0602 Shortness of breath: Secondary | ICD-10-CM | POA: Diagnosis not present

## 2021-07-29 DIAGNOSIS — I5023 Acute on chronic systolic (congestive) heart failure: Secondary | ICD-10-CM | POA: Diagnosis not present

## 2021-07-29 DIAGNOSIS — G8194 Hemiplegia, unspecified affecting left nondominant side: Secondary | ICD-10-CM | POA: Diagnosis not present

## 2021-07-29 DIAGNOSIS — G2581 Restless legs syndrome: Secondary | ICD-10-CM | POA: Diagnosis not present

## 2021-07-29 DIAGNOSIS — N189 Chronic kidney disease, unspecified: Secondary | ICD-10-CM | POA: Diagnosis not present

## 2021-07-29 DIAGNOSIS — I130 Hypertensive heart and chronic kidney disease with heart failure and stage 1 through stage 4 chronic kidney disease, or unspecified chronic kidney disease: Secondary | ICD-10-CM | POA: Diagnosis not present

## 2021-07-29 DIAGNOSIS — E1122 Type 2 diabetes mellitus with diabetic chronic kidney disease: Secondary | ICD-10-CM | POA: Diagnosis not present

## 2021-07-29 DIAGNOSIS — N184 Chronic kidney disease, stage 4 (severe): Secondary | ICD-10-CM | POA: Diagnosis not present

## 2021-07-29 DIAGNOSIS — R29810 Facial weakness: Secondary | ICD-10-CM | POA: Diagnosis not present

## 2021-07-29 HISTORY — DX: Cerebral infarction, unspecified: I63.9

## 2021-07-29 NOTE — Telephone Encounter (Signed)
Patient is non-compliant on Farxiga. LF 06/22/2021 for 30 days (Today is 07/29/21). Tried calling phone but no answer, left VM to call me back

## 2021-07-30 DIAGNOSIS — U071 COVID-19: Secondary | ICD-10-CM | POA: Diagnosis not present

## 2021-07-30 DIAGNOSIS — R4781 Slurred speech: Secondary | ICD-10-CM | POA: Diagnosis not present

## 2021-07-30 DIAGNOSIS — I517 Cardiomegaly: Secondary | ICD-10-CM | POA: Diagnosis not present

## 2021-07-30 DIAGNOSIS — R29810 Facial weakness: Secondary | ICD-10-CM | POA: Diagnosis not present

## 2021-07-30 DIAGNOSIS — N189 Chronic kidney disease, unspecified: Secondary | ICD-10-CM | POA: Diagnosis not present

## 2021-07-30 DIAGNOSIS — I480 Paroxysmal atrial fibrillation: Secondary | ICD-10-CM | POA: Diagnosis not present

## 2021-07-30 DIAGNOSIS — I639 Cerebral infarction, unspecified: Secondary | ICD-10-CM | POA: Diagnosis not present

## 2021-07-30 DIAGNOSIS — R0602 Shortness of breath: Secondary | ICD-10-CM | POA: Diagnosis not present

## 2021-07-30 DIAGNOSIS — I130 Hypertensive heart and chronic kidney disease with heart failure and stage 1 through stage 4 chronic kidney disease, or unspecified chronic kidney disease: Secondary | ICD-10-CM | POA: Diagnosis not present

## 2021-07-30 DIAGNOSIS — I6300 Cerebral infarction due to thrombosis of unspecified precerebral artery: Secondary | ICD-10-CM | POA: Diagnosis not present

## 2021-07-30 DIAGNOSIS — I509 Heart failure, unspecified: Secondary | ICD-10-CM | POA: Diagnosis not present

## 2021-07-30 DIAGNOSIS — E7800 Pure hypercholesterolemia, unspecified: Secondary | ICD-10-CM | POA: Diagnosis not present

## 2021-07-30 DIAGNOSIS — G8194 Hemiplegia, unspecified affecting left nondominant side: Secondary | ICD-10-CM | POA: Diagnosis not present

## 2021-08-01 ENCOUNTER — Encounter: Payer: Self-pay | Admitting: Family Medicine

## 2021-08-01 ENCOUNTER — Telehealth: Payer: Self-pay

## 2021-08-01 ENCOUNTER — Telehealth (INDEPENDENT_AMBULATORY_CARE_PROVIDER_SITE_OTHER): Payer: Medicare HMO | Admitting: Family Medicine

## 2021-08-01 VITALS — Temp 98.0°F | Wt 150.0 lb

## 2021-08-01 DIAGNOSIS — U071 COVID-19: Secondary | ICD-10-CM

## 2021-08-01 DIAGNOSIS — M1712 Unilateral primary osteoarthritis, left knee: Secondary | ICD-10-CM | POA: Diagnosis not present

## 2021-08-01 DIAGNOSIS — N184 Chronic kidney disease, stage 4 (severe): Secondary | ICD-10-CM | POA: Diagnosis not present

## 2021-08-01 DIAGNOSIS — I5023 Acute on chronic systolic (congestive) heart failure: Secondary | ICD-10-CM | POA: Diagnosis not present

## 2021-08-01 DIAGNOSIS — I428 Other cardiomyopathies: Secondary | ICD-10-CM | POA: Diagnosis not present

## 2021-08-01 DIAGNOSIS — D631 Anemia in chronic kidney disease: Secondary | ICD-10-CM | POA: Diagnosis not present

## 2021-08-01 DIAGNOSIS — R3981 Functional urinary incontinence: Secondary | ICD-10-CM | POA: Diagnosis not present

## 2021-08-01 DIAGNOSIS — K219 Gastro-esophageal reflux disease without esophagitis: Secondary | ICD-10-CM | POA: Diagnosis not present

## 2021-08-01 DIAGNOSIS — E1122 Type 2 diabetes mellitus with diabetic chronic kidney disease: Secondary | ICD-10-CM | POA: Diagnosis not present

## 2021-08-01 DIAGNOSIS — I5022 Chronic systolic (congestive) heart failure: Secondary | ICD-10-CM

## 2021-08-01 DIAGNOSIS — G8194 Hemiplegia, unspecified affecting left nondominant side: Secondary | ICD-10-CM

## 2021-08-01 DIAGNOSIS — I6789 Other cerebrovascular disease: Secondary | ICD-10-CM | POA: Diagnosis not present

## 2021-08-01 DIAGNOSIS — G2581 Restless legs syndrome: Secondary | ICD-10-CM | POA: Diagnosis not present

## 2021-08-01 DIAGNOSIS — I130 Hypertensive heart and chronic kidney disease with heart failure and stage 1 through stage 4 chronic kidney disease, or unspecified chronic kidney disease: Secondary | ICD-10-CM | POA: Diagnosis not present

## 2021-08-01 NOTE — Telephone Encounter (Signed)
  Transition Care Management Follow-up Telephone Call   [place] 1934/01/05  Admit Date: 07/30/21 Discharge Date: 07/30/21 Discharged from where: [facility]   Diagnoses: COVID-19, CVA, CHF, Chronic renal insufficiency    2 day post [place] was discharged from [facility] on 07/30/2021 with the diagnoses listed above. She  was contacted today via telephone in regards to transition of care.     Discharge Instructions: Increase Eliquis to 5 mg twice daily.   Items Reviewed: Did the pt receive and understand the discharge instructions provided? Yes  Medications obtained and verified? Yes  Other? No  Any new allergies since your discharge? No  Dietary orders reviewed? Yes Do you have support at home? Yes   Home Care and Equipment/Supplies: Were home health services ordered? yes If so, what is the name of the agency? [place] set up a time to come to the patient's home? no Were any new equipment or medical supplies ordered?  No  Do you have any questions related to the use of the equipment or supplies?  Family is questioning if external female catheter can be ordered .   Functional Questionnaire: (I = Independent and D = Dependent) ADLs: D  Bathing/Dressing- D  Meal Prep- D  Eating- Needs help in getting to table.   Maintaining continence- Frequently trips to the bathroom or bedside commode.    Transferring/Ambulation- Needs help with ambulation.  She is having frequent falls.   Managing Meds- Daughter preparing medications.   Any patient concerns? Frequent falls.    Follow up appointments reviewed: PCP Hospital f/u appt confirmed? Yes  Scheduled for televisit today @ 7 pm.  [facility] f/u appt confirmed? Daughter to call Neurology to schedule follow-up in 1 -2 weeks. Are transportation arrangements needed? Yes  If their condition worsens, is the pt aware to call PCP or  go to the Emergency Dept.? Yes Was the patient provided with contact information for the PCP's office/after hours number? Yes Was to pt encouraged to call back with questions or concerns? Yes    08/01/21 1:19 PM

## 2021-08-01 NOTE — Assessment & Plan Note (Signed)
Hospitalists felt pt was asymptomatic and did not give antiviral treatment.

## 2021-08-01 NOTE — Assessment & Plan Note (Signed)
Pt was sent home on eliquis 5 mg one twice a day.  It appears the hospital did not realize she has an entire bottle of xarelto 15 mg once daily. The family is unable to get the eliquis from pharmacy.  I recommend pt return to xarelto 15 mg once at night.  Continue pravastatin.

## 2021-08-01 NOTE — Assessment & Plan Note (Signed)
Stable

## 2021-08-01 NOTE — Progress Notes (Addendum)
Virtual Visit via Video Note   This visit type was conducted due to national recommendations for restrictions regarding the COVID-19 Pandemic (e.g. social distancing) in an effort to limit this patient's exposure and mitigate transmission in our community.  Due to her co-morbid illnesses, this patient is at least at moderate risk for complications without adequate follow up.  This format is felt to be most appropriate for this patient at this time.  All issues noted in this document were discussed and addressed.  A limited physical exam was performed with this format.  A verbal consent was obtained for the virtual visit.   Date:  08/01/2021   ID:  [name], DOB 18-Aug-1934, MRN [id number]  Patient Location: Home Provider Location: Home Office  PCP:  Sandy Brome, MD   Evaluation Performed:  Follow-Up Visit TOC  Chief Complaint:  left sided weakness  History of Present Illness:    [name] is a 87 y.o. female with past medical history significant for type 2 diabetes, chronic kidney disease stage IV, systolic CHF with EF 35 to 40% who has had multiple hospital admissions over the last year for acute on chronic systolic heart failure.  On October 21 [name] presented to [facility] with an acute CVA.  She had left-sided weakness and a left-sided droop.  Some of this has already improved.  She was discharged home on July 30, 2001.  It appears I thought she was on Eliquis and so increased her Eliquis from 2-1/2 mg twice daily to 5 mg twice daily, however with speaking to her daughter she has only Xarelto at the house.  They did try to pick up the new prescription for Eliquis however the insurance would not cover it due to her already being on Xarelto.  Telemetry monitoring during her hospitalization shows normal sinus rhythm.  Patient was also diagnosed with COVID-19, but had minimal symptoms if any.  Her husband at home had tested for COVID-19  earlier in the week.  Patient has physical therapy and nursing through home health.  Lab work: Digoxin serum level less than 40. A1c 5. Triglycerides 66, total cholesterol 108, LDL cholesterol 64, and HDL cholesterol was 30. Troponin x2 were negative.  PT/INR were normal. Hemoglobin 10.1 Creatinine 1.2 with a GFR of 43. proBNP was 13,000 which is down from 35,000 from hospitalization earlier in the month. Influenza A negative influenza B negative, RSV panel negative.  COVID positive  Diagnostic imaging: Subacute infarct in the left lentiform nucleus/internal capsule. No C-spine fractures but significant arthritis. Encephalomalacia in the left frontal lobe consistent with prior injury. Right maxillary sinus mucous retention cyst with evidence of chronic right maxillary sinusitis.  Patient has had 3-4 falls in the last 2 to 3 weeks.  This occurs in the middle of the night usually.  She is usually trying to get up to use the bedside commode.  The family is requesting an external female catheter (pure wick) so she does not have to get up so much in the middle of the night whereby decreasing her risk of falls.  Past Medical History:  Diagnosis Date   Acute gastritis with hemorrhage 09/06/2020   Amiodarone pulmonary toxicity    Anemia    no GI bleeding; on iron   Arthritis    Atrial fibrillation (HCC)    Automatic implantable cardioverter-defibrillator in situ 12/26/2010   Qualifier: Diagnosis of  By: Lovena Le, MD, Leonidas Romberg Binnie Kand    Cardiomyopathy [facility]) 12/23/2010  Qualifier: Diagnosis of  By: Selena Batten CMA, Jewel    Overview:  Overview:  Qualifier: Diagnosis of  By: Selena Batten CMA, Jewel    Carotid artery stenosis 09/24/2018   CHF (congestive heart failure) ([place])    Chronic a-fib ([place])    on xarelto; failed DCCV   Chronic kidney disease, stage IV (severe) ([place])    Chronic systolic heart failure ([place]) 12/26/2010   Qualifier: Diagnosis of  By: Lovena Le, MD, Martyn Malay   Overview:   Overview:  Qualifier: Diagnosis of  By: Lovena Le, MD, Martyn Malay  Last Assessment & Plan:  Her symptoms remain class II. No change in medications. She will maintain a low-sodium diet.   Complete atrioventricular block ([place]) 12/23/2010   Overview:  Overview:  Qualifier: Diagnosis of  By: Selena Batten CMA, Jewel    Dyslipidemia associated with type 2 diabetes mellitus ([place]) 12/25/2019   Fibromyalgia    Full dentures    GAD (generalized anxiety disorder)    GERD (gastroesophageal reflux disease)    H/O cardiovascular stress test 08/04/2010   normal imaging; EF 61%   H/O echocardiogram 07/05/2010   EF 45-50%; aortic valve-mildly sclerotic; LA-mod dilaterd    Hallux limitus of right foot 06/27/2016   HFrEF (heart failure with reduced ejection fraction) ([place]) 01/13/2021   Hyperlipemia    Hypertensive heart disease with heart failure ([place])    Hypothyroidism    ICD (implantable cardioverter-defibrillator), biventricular, in situ 12/23/2010   Annotation:  Medtronic Qualifier: Diagnosis of  By: Selena Batten CMA, Jewel    Overview:  Overview:  Annotation:  Medtronic Qualifier: Diagnosis of  By: Selena Batten CMA, Jewel   Last Assessment & Plan:  Her device is working normally. No diaphragmatic stimulation. Will follow.    Iron deficiency anemia 09/06/2020   LBBB (left bundle branch block)    Mixed hyperlipidemia    Morbid obesity ([place]) 01/13/2021   NSVT (nonsustained ventricular tachycardia) 09/18/2018   Occlusion and stenosis of right carotid artery    Pneumonia    Presence of automatic (implantable) cardiac defibrillator    Primary osteoarthritis of left knee 12/01/2019   PVD (peripheral vascular disease) ([place])    L RA stent 1994   RAS (renal artery stenosis) ([place])    a. renal dopp (12/18/08)- Right RA-<60%; L RA stent- open; nl size and shape in both kidneys;  b.  Rena Artery [name] (3/16):  SMA with > 70% stenosis, Bilateral prox RA with 1-59%   Restless leg syndrome    Secondary hypothyroidism 12/25/2019    Type 2 diabetes mellitus with diabetic neuropathy ([place])    VENTRICULAR FIBRILLATION 12/26/2010   Qualifier: Diagnosis of  By: Lovena Le, MD, Martyn Malay    VT (ventricular tachycardia) 06/2010   polymorphic VT probably related to sotalol therapy   Wears glasses     Past Surgical History:  Procedure Laterality Date   ABDOMINAL HYSTERECTOMY     APPENDECTOMY     AV NODE ABLATION  06/08/2006   performed by Dr. Beckie Salts; due to Afib with RVR and tachy brady syndrome   BIV ICD GENERATOR CHANGEOUT N/A 12/31/2017   Procedure: [place];  Surgeon: Evans Lance, MD;  Location: [facility] CV LAB;  Service: Cardiovascular;  Laterality: N/A;   BIV ICD GENERTAOR CHANGE OUT  11/15/10   Medtronic Protecta [id number] serial #[id number]   BiV ICD Placement  06/17/2007; 04/20/14   Medtronic Concerta [id number]; RA lead-Med 5076/53cm, [id number]; RV lead- SJM 7121/65cm, [id number]; LV lead- Med 4194/88cm, [id number];  gen change 04/2014 by Dr Lovena Le   BIV PACEMAKER GENERATOR CHANGE OUT N/A 04/20/2014   Procedure: BIV PACEMAKER GENERATOR CHANGE OUT;  Surgeon: Evans Lance, MD;  Location: [facility] CATH LAB;  Service: Cardiovascular;  Laterality: N/A;   BRAIN MENINGIOMA EXCISION  2011   L frontal meningioma at [place]     lumpectomy right   CARDIOVERSION  07/21/05   converted to sinus brady   CAROTID ENDARTERECTOMY Right 09/24/2018   with bovine pericardial patch angioplasty   CATARACT EXTRACTION W/ INTRAOCULAR LENS  IMPLANT, BILATERAL     CHOLECYSTECTOMY     Dr. Sherald Hess in [place] and Dr. Lyda Jester follows; surgical stricture post procedure with stent placement   COLONOSCOPY W/ BIOPSIES AND POLYPECTOMY     DILATION AND CURETTAGE OF UTERUS     ENDARTERECTOMY Right 09/24/2018   Procedure: ENDARTERECTOMY CAROTID RIGHT;  Surgeon: Angelia Mould, MD;  Location: [facility];  Service: Vascular;  Laterality: Right;   PV Angio  1994   L Renal artery stent     Family  History  Problem Relation Age of Onset   Heart failure Mother    Hypertension Mother    Cancer Father        lung   Dementia Sister    Cancer Sister        pancreatic, renal and thyroid   Heart attack Brother    AAA (abdominal aortic aneurysm) Brother    Stroke Maternal Grandmother    Diabetes Other    Stroke Son     Social History   Socioeconomic History   Marital status: Married    Spouse name: Not on file   Number of children: 6   Years of education: Not on file   Highest education level: Not on file  Occupational History   Not on file  Tobacco Use   Smoking status: Never   Smokeless tobacco: Never  Vaping Use   Vaping Use: Never used  Substance and Sexual Activity   Alcohol use: No    Alcohol/week: 0.0 standard drinks   Drug use: No   Sexual activity: Not Currently  Other Topics Concern   Not on file  Social History Narrative   wears sunscreen, brushes and flosses daily, see's dentist bi-annually, has smoke/carbon monoxide detectors, wears a seatbelt and practices gun safety   Social Determinants of Health   Financial Resource Strain: Not on file  Food Insecurity: Not on file  Transportation Needs: Not on file  Physical Activity: Not on file  Stress: Not on file  Social Connections: Not on file  Intimate Partner Violence: Not on file    Outpatient Medications Prior to Visit  Medication Sig Dispense Refill   Accu-Chek FastClix Lancets MISC TEST BLOOD SUGAR TWICE DAILY 204 each 3   ACCU-CHEK SMARTVIEW test strip CHECK BLOOD SUGAR TWICE DAILY 200 strip 3   Alcohol Swabs (DROPSAFE ALCOHOL PREP) 70 % PADS USE TWICE DAILY 200 each 3   allopurinol (ZYLOPRIM) 100 MG tablet Take 100 mg by mouth daily.     ALPRAZolam (XANAX) 0.5 MG tablet TAKE ONE TABLET BY MOUTH EVERY NIGHT AT BEDTIME FOR INSOMNIA 30 tablet 0   calcium carbonate (OSCAL) 1500 (600 Ca) MG TABS tablet Take 600 mg of elemental calcium by mouth 2 (two) times daily with a meal.     digoxin (LANOXIN)  0.125 MG tablet Take 0.5 tablets by mouth daily.     donepezil (ARICEPT) 5 MG tablet Take 1 tablet (  5 mg total) by mouth at bedtime. 90 tablet 0   DULoxetine (CYMBALTA) 20 MG capsule TAKE 1 CAPSULE BY MOUTH EVERYDAY AT BEDTIME 90 capsule 1   ELIQUIS 2.5 MG TABS tablet Take 1 tablet (2.5 mg total) by mouth 2 (two) times daily. 60 tablet 1   ezetimibe (ZETIA) 10 MG tablet Take 10 mg by mouth daily.     FARXIGA 10 MG TABS tablet TAKE 1 TABLET BY MOUTH EVERY DAY 30 tablet 0   fenofibrate micronized (LOFIBRA) 134 MG capsule TAKE 1 CAPSULE (134 MG TOTAL) BY MOUTH DAILY. 90 capsule 1   fluticasone (FLONASE) 50 MCG/ACT nasal spray Place 2 sprays into both nostrils daily as needed for allergies or rhinitis.     furosemide (LASIX) 40 MG tablet TAKE 1 TABLET BY MOUTH AT 0800 AND 1600 60 tablet 0   isosorbide mononitrate (IMDUR) 30 MG 24 hr tablet Take 1 tablet (30 mg total) by mouth daily. 90 tablet 1   levothyroxine (SYNTHROID) 50 MCG tablet Take 50 mcg by mouth daily before breakfast.     Multiple Vitamin (MULTIVITAMIN WITH MINERALS) TABS tablet Take 1 tablet by mouth daily. Centrum Silver     Naphazoline-Pheniramine (OPCON-A) 0.027-0.315 % SOLN Apply 1 drop to eye in the morning and at bedtime.     nystatin cream (MYCOSTATIN) Apply 1 application topically 3 (three) times daily. Cream to back rash     olopatadine (PATANOL) 0.1 % ophthalmic solution Place 1 drop into both eyes 2 (two) times daily.     omega-3 acid ethyl esters (LOVAZA) 1 g capsule Take 1 g by mouth 2 (two) times daily.     omeprazole (PRILOSEC) 40 MG capsule Take 1 capsule (40 mg total) by mouth daily. 90 capsule 3   oxyCODONE-acetaminophen (PERCOCET) 7.5-325 MG tablet Take 1 tablet by mouth every 6 (six) hours as needed (back pain.). 120 tablet 0   polyethylene glycol (MIRALAX / GLYCOLAX) 17 g packet Take 17 g by mouth daily as needed for constipation.     Potassium Chloride ER 20 MEQ TBCR Take 20 mEq by mouth daily. 60 tablet 0    pravastatin (PRAVACHOL) 10 MG tablet Take 10 mg by mouth 4 (four) times a week.     rOPINIRole (REQUIP) 1 MG tablet Take 1 mg by mouth at bedtime.     senna (SENOKOT) 8.6 MG TABS tablet Take 2 tablets by mouth daily.     tiZANidine (ZANAFLEX) 4 MG tablet Take 4 mg by mouth at bedtime.     No facility-administered medications prior to visit.    Allergies:   Gabapentin, Pregabalin, Morphine, Penicillins, Tape, Ace inhibitors, Carvedilol, Insulin glargine, Metoprolol, Pantoprazole, Procaine, Statins, and Lisinopril   Social History   Tobacco Use   Smoking status: Never   Smokeless tobacco: Never  Vaping Use   Vaping Use: Never used  Substance Use Topics   Alcohol use: No    Alcohol/week: 0.0 standard drinks   Drug use: No     Review of Systems  Constitutional:  Positive for malaise/fatigue. Negative for chills, diaphoresis and fever.  HENT:  Negative for congestion, ear pain and sore throat.   Respiratory:  Positive for cough and shortness of breath. Negative for sputum production.   Cardiovascular:  Negative for chest pain and palpitations.  Gastrointestinal:  Negative for abdominal pain, constipation, diarrhea, nausea and vomiting.  Genitourinary:  Positive for frequency. Negative for dysuria and urgency.  Musculoskeletal:  Positive for back pain. Negative for myalgias.  Neurological:  Negative for dizziness and headaches.  Psychiatric/Behavioral:  Negative for depression. The patient is not nervous/anxious.     Labs/Other Tests and Data Reviewed:    Recent Labs: 09/01/2020: Magnesium 2.0 12/03/2020: NT-Pro BNP 9,526 07/21/2021: ALT 10; BUN 37; Creatinine, Ser 1.57; Hemoglobin 9.1; Platelets 309; Potassium 4.8; Sodium 140; TSH 1.490   Recent Lipid Panel Lab Results  Component Value Date/Time   CHOL 92 (L) 03/01/2021 11:33 AM   TRIG 39 03/01/2021 11:33 AM   HDL 36 (L) 03/01/2021 11:33 AM   CHOLHDL 2.6 03/01/2021 11:33 AM   LDLCALC 45 03/01/2021 11:33 AM    Wt Readings  from Last 3 Encounters:  07/21/21 154 lb (69.9 kg)  05/05/21 160 lb (72.6 kg)  05/04/21 160 lb (72.6 kg)     Objective:    Vital Signs:  There were no vitals taken for this visit.   Physical Exam Vitals reviewed.  Constitutional:      Appearance: Normal appearance.  Musculoskeletal:        General: Swelling (left knee) present.     Right lower leg: Edema (trace) present.     Left lower leg: Edema (trace) present.     Comments: Left arm and left leg weakness.  Minimal facial droop  Neurological:     Mental Status: She is alert.     ASSESSMENT & PLAN:    Problem List Items Addressed This Visit       Cardiovascular and Mediastinum   Chronic systolic heart failure ([place])    EF 35%. Numerous hospitalizations for acute on chronic chf. Continue current medications. Daily weights.       Hemiparesis of left nondominant side due to acute cerebrovascular disease ([place])    Pt was sent home on eliquis 5 mg one twice a day.  It appears the hospital did not realize she has an entire bottle of xarelto 15 mg once daily. The family is unable to get the eliquis from pharmacy.  I recommend pt return to xarelto 15 mg once at night.  Continue pravastatin.         Genitourinary   Chronic kidney disease, stage 4 (severe) (HCC) - Primary    Stable.         Other   Clinical diagnosis of COVID-19    Hospitalists felt pt was asymptomatic and did not give antiviral treatment.       Urinary incontinence due to immobility    Order Purewick female external catheters.  2 per day         I spent 40 minutes dedicated to the care of this patient on the date of this encounter to include face-to-face time with the patient, as well as: chart review and ordering supplies.   Follow Up:  In Person in 4 week(s)  Signed, Sandy Brome, MD  08/01/2021 7:23 PM    [place]

## 2021-08-01 NOTE — Assessment & Plan Note (Signed)
Order Purewick female external catheters.  2 per day

## 2021-08-01 NOTE — Assessment & Plan Note (Signed)
EF 35%. Numerous hospitalizations for acute on chronic chf. Continue current medications. Daily weights.

## 2021-08-02 ENCOUNTER — Encounter: Payer: Medicare HMO | Admitting: Student

## 2021-08-03 DIAGNOSIS — E1122 Type 2 diabetes mellitus with diabetic chronic kidney disease: Secondary | ICD-10-CM | POA: Diagnosis not present

## 2021-08-03 DIAGNOSIS — I428 Other cardiomyopathies: Secondary | ICD-10-CM | POA: Diagnosis not present

## 2021-08-03 DIAGNOSIS — D631 Anemia in chronic kidney disease: Secondary | ICD-10-CM | POA: Diagnosis not present

## 2021-08-03 DIAGNOSIS — I5023 Acute on chronic systolic (congestive) heart failure: Secondary | ICD-10-CM | POA: Diagnosis not present

## 2021-08-03 DIAGNOSIS — K219 Gastro-esophageal reflux disease without esophagitis: Secondary | ICD-10-CM | POA: Diagnosis not present

## 2021-08-03 DIAGNOSIS — N184 Chronic kidney disease, stage 4 (severe): Secondary | ICD-10-CM | POA: Diagnosis not present

## 2021-08-03 DIAGNOSIS — G2581 Restless legs syndrome: Secondary | ICD-10-CM | POA: Diagnosis not present

## 2021-08-03 DIAGNOSIS — I130 Hypertensive heart and chronic kidney disease with heart failure and stage 1 through stage 4 chronic kidney disease, or unspecified chronic kidney disease: Secondary | ICD-10-CM | POA: Diagnosis not present

## 2021-08-03 DIAGNOSIS — M1712 Unilateral primary osteoarthritis, left knee: Secondary | ICD-10-CM | POA: Diagnosis not present

## 2021-08-04 ENCOUNTER — Ambulatory Visit (INDEPENDENT_AMBULATORY_CARE_PROVIDER_SITE_OTHER): Payer: Medicare HMO

## 2021-08-04 ENCOUNTER — Telehealth: Payer: Self-pay

## 2021-08-04 NOTE — Telephone Encounter (Signed)
Spoke with Maryann Alar and she said okay thank you!

## 2021-08-04 NOTE — Patient Instructions (Signed)
Visit Information   Goals Addressed   None    Patient Care Plan: CCM Pharmacy Care Plan     Problem Identified: dm, chf, hld   Priority: High  Onset Date: 01/10/2021     Long-Range Goal: Disease Management   Start Date: 01/10/2021  Expected End Date: 01/10/2022  Recent Progress: On track  Priority: High  Note:   Current Barriers:  Unable to maintain control of heart failure     Pharmacist Clinical Goal(s):  Patient will maintain control of heart failure as evidenced by symptom management and avoiding hospitalization  through collaboration with PharmD and provider.    Interventions: 1:1 collaboration with Cox, Kirsten, MD regarding development and update of comprehensive plan of care as evidenced by provider attestation and co-signature Inter-disciplinary care team collaboration (see longitudinal plan of care) Comprehensive medication review performed; medication list updated in electronic medical record   Diabetes (A1c goal 7.5%) -Controlled -Current medications: accu-chek fast clix lancets accu-chek smartview test strips -Medications previously tried: insulin   -Current home glucose readings fasting glucose: 74 mg/dL not taking meds -Denies hypoglycemic/hyperglycemic symptoms -Current meal patterns:  breakfast: daughter fixing breakfast eggs and grits lunch: sandwich or peanut butter carckers  dinner: family brings in meals - meat and vegetables snacks: peanut butter crackers drinks: water -Current exercise: working with PT -Educated on A1c and blood sugar goals; Prevention and management of hypoglycemic episodes; Benefits of routine self-monitoring of blood sugar; -Counseled to check feet daily and get yearly eye exams -Counseled on diet and exercise extensively Recommended to continue current medication   Heart Failure (Goal: manage symptoms and prevent exacerbations) -Controlled -Last ejection fraction: 71-24% -HF type: Systolic -NYHA Class: II (slight  limitation of activity) -Current treatment: amlodipine 5 mg daily  Bumetanide 1 mg daily Losartan 100 mg daily  -Medications previously tried: lasix, bumex, carvedilol, metoprolol, spironolactone -Current home BP/HR readings:  129-140/80 mmHg -Current dietary habits: children bringing in meals. States she is eating well.  -Current exercise habits: working with PT at home  -Educated on Benefits of medications for managing symptoms and prolonging life Importance of weighing daily; if you gain more than 3 pounds in one day or 5 pounds in one week,    Importance of blood pressure control -Counseled on diet and exercise extensively Recommended to continue current medication  AFib -Controlled -Current treatment: Xarelto 15mg  -Medications previously tried: Eliquis [facility] changed Xarelto to Eliquis but insurance wouldn't pay since they already had Xarelto October 2022: Daughter told me she's giving Xarelto BID since they couldn't get Eliquis, I asked her to double check the SIG and she told me it says, "Take 1HS." Recommended she stop Xarelto BID and follow directions, I explained how she most likely confused the two blood thinners and counseled on S/S of bleeding   Patient Goals/Self-Care Activities Patient will:  - take medications as prescribed focus on medication adherence by using pill box check glucose daily, document, and provide at future appointments check blood pressure daily, document, and provide at future appointments   Follow Up Plan: Telephone follow up appointment with care management team member scheduled for: July 2023  [place] Constable, Pharm.D. - [phone]       The patient verbalized understanding of instructions, educational materials, and care plan provided today and declined offer to receive copy of patient instructions, educational materials, and care plan.  The pharmacy team will reach out to the patient again over the next 90 days.   Lane Hacker,  [facility]

## 2021-08-04 NOTE — Progress Notes (Signed)
Chronic Care Management Pharmacy Note  08/04/2021 Name:  Malaiya Paczkowski MRN:  [id number] DOB:  17-Nov-1933   Plan Recommendations:  Patient fell twice last night trying to go to the bathroom. Daughter was wondering if she could get a Purewick instead of a bedside commode so she wouldn't have to get up, will ask PCP how we can help coordinate this Patient's daughter is meeting with Hospice today. Over past 24 hours patient's mental state deteriorated and she started hallucinating that the patient was talking with her mother and didn't recognize the house she's lived in for the past few years. Will alert PCP  Subjective: Angelice Piech Jaiyla Granados is an 87 y.o. year old female who is a primary patient of Cox, Kirsten, MD.  The CCM team was consulted for assistance with disease management and care coordination needs.    Engaged with patient by telephone for follow up visit in response to provider referral for pharmacy case management and/or care coordination services.   Consent to Services:  The patient was given information about Chronic Care Management services, agreed to services, and gave verbal consent prior to initiation of services.  Please see initial visit note for detailed documentation.   Patient Care Team: [name], MD as PCP - General (Family Medicine) Evans Lance, MD as Consulting Physician (Cardiology) Sharyn Dross., DPM (Podiatry) Burnice Logan, [facility] (Inactive) as Pharmacist (Pharmacist) Berniece Salines, DO as Consulting Physician (Cardiology) Thana Ates, RN as [place] Management  Recent office visits: 03/02/21-Labs ordered,  She has not had any blood in her stools that she is aware.  She will pick-up stool cards.  Anemia improved.  Kidney function worsened. Hold spironolactone. Recommend increase water. Repeat CMP in 2 weeks. Please ask if pt has seen nephrology.If so, please send labs and tell her I would like her to follow  up within the next week. If no nephrologist, pt needs one. Start referral. Liver function normal. Lipids well controlled. Vitamin D normal   02/22/21-Dr. Cox PCP, HTN with CHF, Knee pain- fell on Saturday has had increased pain since fall, Use debrox drops to rt ear as directed.  Return in one week for ear washing and fasting lab work. 12/09/2020 - continue bumetanide 1 mg bid.   Recent consult visits: 05/04/2021 - oncology for anemia - order 2 units of blood. Furosemide given between the units to avoid fluid overload. IV iron infusions.  12/10/2020-12/21/2020 - hospital admission and follow-up with Dr. Tobie Poet 1 week post SNF discharge. Follow-up renal ultrasound to evaluate left renal mass. Xarelto continued s/p PE.   Hospital visits: 05/15/2021 - ED visit - shortness of breath and weakness. Elevated potassium. Spironolactone held. Recommend considering discontinuation due to kidney disease.  04/13/2021 - ED visit - shortness of breath. IV diuresis given.  12/10/2020-12/21/2020 - hospital admission and follow-up with Dr. Tobie Poet 1 week post SNF discharge. Follow-up renal ultrasound to evaluate left renal mass. Xarelto continued s/p PE.  Objective:  Lab Results  Component Value Date   CREATININE 1.57 (H) 07/21/2021   BUN 37 (H) 07/21/2021   GFR 38.32 (L) 11/30/2014   GFRNONAA 34 (L) 12/03/2020   GFRAA 39 (L) 12/03/2020   NA 140 07/21/2021   K 4.8 07/21/2021   CALCIUM 9.6 07/21/2021   CO2 26 07/21/2021   GLUCOSE 93 07/21/2021    Lab Results  Component Value Date/Time   HGBA1C 5.1 07/21/2021 02:30 PM   HGBA1C 5.0 12/03/2020 11:53 AM  GFR 38.32 (L) 11/30/2014 12:13 PM   GFR 35.46 (L) 04/13/2014 11:03 AM   MICROALBUR 10 12/25/2019 12:45 PM    Last diabetic Eye exam: No results found for: HMDIABEYEEXA  Last diabetic Foot exam: No results found for: HMDIABFOOTEX   Lab Results  Component Value Date   CHOL 92 (L) 03/01/2021   HDL 36 (L) 03/01/2021   LDLCALC 45 03/01/2021   TRIG 39  03/01/2021   CHOLHDL 2.6 03/01/2021    Hepatic Function Latest Ref Rng & Units 07/21/2021 03/01/2021 01/21/2021  Total Protein 6.0 - 8.5 g/dL 5.8(L) 6.1 -  Albumin 3.6 - 4.6 g/dL 4.0 3.8 4.0  AST 0 - 40 IU/L _0 ALT 0 - 32 IU/L _1 Alk Phosphatase 44 - 121 IU/L 44 39(L) 44  Total Bilirubin 0.0 - 1.2 mg/dL 0.4 0.5 -    Lab Results  Component Value Date/Time   TSH 1.490 07/21/2021 02:30 PM   TSH 2.440 05/25/2020 12:15 PM    CBC Latest Ref Rng & Units 07/21/2021 05/04/2021 03/01/2021  WBC 3.4 - 10.8 x10E3/uL 4.5 5.0 3.9  Hemoglobin 11.1 - 15.9 g/dL 9.1(L) 6.7(A) 9.2(L)  Hematocrit 34.0 - 46.6 % 29.1(L) 21(A) 29.5(L)  Platelets 150 - 450 x10E3/uL 309 366 294    Lab Results  Component Value Date/Time   VD25OH 61.7 03/01/2021 11:33 AM   VD25OH 61.7 08/12/2020 11:40 AM    Clinical ASCVD: No  The ASCVD Risk score (Arnett DK, et al., 2019) failed to calculate for the following reasons:   The 2019 ASCVD risk score is only valid for ages 53 to 71    Depression screen PHQ 2/9 02/22/2021 11/23/2020 08/26/2020  Decreased Interest 0 3 0  Down, Depressed, Hopeless 0 3 2  PHQ - 2 Score 0 6 2  Altered sleeping - 0 2  Tired, decreased energy - 3 3  Change in appetite - 3 0  Feeling bad or failure about yourself  - 3 0  Trouble concentrating - 3 0  Moving slowly or fidgety/restless - 3 2  Suicidal thoughts - 0 0  PHQ-9 Score - 21 9  Difficult doing work/chores - Extremely dIfficult -  Some recent data might be hidden      Social History   Tobacco Use  Smoking Status Never  Smokeless Tobacco Never   BP Readings from Last 3 Encounters:  07/21/21 (!) 144/76  05/05/21 (!) 131/46  05/04/21 (!) 147/65   Pulse Readings from Last 3 Encounters:  07/21/21 64  05/05/21 69  05/04/21 81   Wt Readings from Last 3 Encounters:  08/01/21 150 lb (68 kg)  07/21/21 154 lb (69.9 kg)  05/05/21 160 lb (72.6 kg)   BMI Readings from Last 3 Encounters:  08/01/21 22.81 kg/m   07/21/21 23.42 kg/m  05/05/21 24.33 kg/m    Assessment/Interventions: Review of patient past medical history, allergies, medications, health status, including review of consultants reports, laboratory and other test data, was performed as part of comprehensive evaluation and provision of chronic care management services.   SDOH:  (Social Determinants of Health) assessments and interventions performed: Yes  SDOH Screenings   Alcohol Screen: Not on file  Depression (PHQ2-9): Low Risk    PHQ-2 Score: 0  Financial Resource Strain: Not on file  Food Insecurity: Not on file  Housing: Not on file  Physical Activity: Not on file  Social Connections: Not on file  Stress: Not on file  Tobacco Use: Low Risk  Smoking Tobacco Use: Never   Smokeless Tobacco Use: Never   Passive Exposure: Not on file  Transportation Needs: Not on file    CCM Care Plan  Allergies  Allergen Reactions   Gabapentin Swelling   Pregabalin Swelling        Morphine Nausea Only   Penicillins Rash and Other (See Comments)    Has patient had a PCN reaction causing immediate rash, facial/tongue/throat swelling, SOB or lightheadedness with hypotension: No Has patient had a PCN reaction causing severe rash involving mucus membranes or skin necrosis: No Has patient had a PCN reaction that required hospitalization: No - MD office Has patient had a PCN reaction occurring within the last 10 years: No If all of the above answers are "NO", then may proceed with Cephalosporin use.    Tape Rash   Ace Inhibitors Other (See Comments)    Angioedema (ALLERGY/intolerance)   Carvedilol Other (See Comments)    Myalgias (intolerance)   Insulin Glargine Itching   Metoprolol Other (See Comments)    Angioedema (ALLERGY/intolerance)   Pantoprazole Nausea Only   Procaine Other (See Comments)    unknown   Statins Other (See Comments)    Myalgias (intolerance)   Lisinopril Rash         Medications Reviewed Today      Reviewed by [name], MD (Physician) on 08/01/21 at 2024  Med List Status: <None>   Medication Order Taking? Sig Documenting Provider Last Dose Status Informant  Accu-Chek FastClix Lancets MISC [id number] Yes TEST BLOOD SUGAR TWICE DAILY Cox, Elnita Maxwell, MD Taking Active   ACCU-CHEK SMARTVIEW test strip [id number] Yes CHECK BLOOD SUGAR TWICE DAILY  Patient taking differently: Linus Galas, PA-C Taking Active   Alcohol Swabs (DROPSAFE ALCOHOL PREP) 70 % PADS [id number] Yes USE TWICE DAILY Cox, Kirsten, MD Taking Active   allopurinol (ZYLOPRIM) 100 MG tablet [id number] No Take 100 mg by mouth daily.  Patient not taking: Reported on 08/01/2021   [provider] Not Taking Active   ALPRAZolam Duanne Moron) 0.5 MG tablet [id number]  TAKE ONE TABLET BY MOUTH EVERY NIGHT AT BEDTIME FOR INSOMNIA Cox, Kirsten, MD  Active   calcium carbonate (OSCAL) 1500 (600 Ca) MG TABS tablet [id number]  Take 600 mg of elemental calcium by mouth daily with breakfast. [provider]  Active   digoxin (LANOXIN) 0.125 MG tablet [id number] Yes Take 0.5 tablets by mouth daily. [provider] Taking Active   donepezil (ARICEPT) 5 MG tablet [id number] Yes Take 1 tablet (5 mg total) by mouth at bedtime. Cox, Kirsten, MD Taking Active   DULoxetine (CYMBALTA) 20 MG capsule [id number] Yes TAKE 1 CAPSULE BY MOUTH EVERYDAY AT BEDTIME Cox, Kirsten, MD Taking Active   ELIQUIS 2.5 MG TABS tablet [id number] No Take 1 tablet (2.5 mg total) by mouth 2 (two) times daily.  Patient not taking: Reported on 08/01/2021   CoxElnita Maxwell, MD Not Taking Active   ezetimibe (ZETIA) 10 MG tablet [id number] Yes Take 10 mg by mouth daily. [provider] Taking Active   FARXIGA 10 MG TABS tablet [id number] Yes TAKE 1 TABLET BY MOUTH EVERY DAY Cox, Kirsten, MD Taking Active   fenofibrate micronized (LOFIBRA) 134 MG capsule [id number] No TAKE 1 CAPSULE (134 MG TOTAL) BY MOUTH DAILY.  Patient not taking: Reported on 08/01/2021    CoxElnita Maxwell, MD Not Taking Active   fluticasone [facility]) 50 MCG/ACT nasal spray [id number] Yes Place 2 sprays into both nostrils daily as needed for allergies or rhinitis.  [provider] Taking Active   furosemide (LASIX) 40 MG tablet [id number] Yes TAKE 1 TABLET BY MOUTH AT 0800 AND 1600 Cox, Kirsten, MD Taking Active   isosorbide mononitrate (IMDUR) 30 MG 24 hr tablet [id number] Yes Take 1 tablet (30 mg total) by mouth daily. Cox, Kirsten, MD Taking Active   levothyroxine (SYNTHROID) 50 MCG tablet [id number] Yes Take 50 mcg by mouth daily before breakfast. [provider] Taking Active   Multiple Vitamin (MULTIVITAMIN WITH MINERALS) TABS tablet [id number] Yes Take 1 tablet by mouth daily. Centrum Silver [provider] Taking Active Self  Naphazoline-Pheniramine (OPCON-A) 0.027-0.315 % SOLN [id number] Yes Apply 1 drop to eye in the morning and at bedtime. [provider] Taking Active   nystatin cream (MYCOSTATIN) [id number] Yes Apply 1 application topically 3 (three) times daily. Cream to back rash [provider] Taking Active   olopatadine (PATANOL) 0.1 % ophthalmic solution [id number] Yes Place 1 drop into both eyes 2 (two) times daily. [provider] Taking Active   omega-3 acid ethyl esters (LOVAZA) 1 g capsule [id number] Yes Take 1 g by mouth 2 (two) times daily. [provider] Taking Active   omeprazole (PRILOSEC) 40 MG capsule [id number] Yes Take 1 capsule (40 mg total) by mouth daily. Cox, Kirsten, MD Taking Active   oxyCODONE-acetaminophen (PERCOCET) 7.5-325 MG tablet [id number] Yes Take 1 tablet by mouth every 6 (six) hours as needed (back pain.). Cox, Kirsten, MD Taking Active   polyethylene glycol (MIRALAX / GLYCOLAX) 17 g packet [id number] Yes Take 17 g by mouth daily as needed for constipation. [provider] Taking Active   Potassium Chloride ER 20 MEQ TBCR [id number] Yes Take 20 mEq by mouth daily. Cox, Kirsten, MD  Taking Active   pravastatin (PRAVACHOL) 10 MG tablet [id number] Yes Take 10 mg by mouth 4 (four) times a week. [provider] Taking Active   rOPINIRole (REQUIP) 1 MG tablet [id number] Yes Take 1 mg by mouth at bedtime. [provider] Taking Active   senna (SENOKOT) 8.6 MG TABS tablet [id number] No Take 2 tablets by mouth daily.  Patient not taking: Reported on 08/01/2021   [provider] Not Taking Active   tiZANidine (ZANAFLEX) 4 MG tablet [id number] Yes Take 4 mg by mouth at bedtime. [provider] Taking Active             Patient Active Problem List   Diagnosis Date Noted   Hemiparesis of left nondominant side due to acute cerebrovascular disease ([place]) 08/01/2021   Clinical diagnosis of COVID-19 08/01/2021   Urinary incontinence due to immobility 08/01/2021   Memory loss 07/21/2021   Acquired thrombophilia ([place]) 07/21/2021   HFrEF (heart failure with reduced ejection fraction) ([place]) 01/13/2021   Chronic kidney disease, stage 4 (severe) (HCC)    GAD (generalized anxiety disorder)    Iron deficiency anemia 09/06/2020   Amiodarone pulmonary toxicity    Arthritis    Chronic a-fib (HCC)    Fibromyalgia    Full dentures    GERD (gastroesophageal reflux disease)    Hypertensive heart disease with heart failure (HCC)    Hypothalamic disease (HCC)    LBBB (left bundle branch block)    Occlusion and stenosis of right carotid artery    PVD (peripheral vascular disease) ([place])    RAS (renal artery stenosis) ([place])    Restless leg syndrome    Type 2 diabetes mellitus with diabetic neuropathy ([place])    Mixed hyperlipidemia 12/25/2019   Secondary hypothyroidism 12/25/2019  Localized osteoarthritis of left knee 12/01/2019   Carotid artery stenosis 09/24/2018   Hallux limitus of right foot 62/70/3500   Chronic systolic heart failure ([place]) 12/26/2010   Automatic implantable cardioverter-defibrillator in situ 12/26/2010   ICD (implantable  cardioverter-defibrillator), biventricular, in situ 12/23/2010   Complete atrioventricular block ([place]) 12/23/2010    Immunization History  Administered Date(s) Administered   Fluad Quad(high Dose 65+) 07/21/2021   Influenza-Unspecified 08/09/2018, 06/23/2019, 06/22/2020   PFIZER(Purple Top)SARS-COV-2 Vaccination 12/18/2019, 08/14/2020   Pneumococcal Conjugate-13 10/05/2014   Pneumococcal Polysaccharide-23 06/11/2013   Tdap 05/28/2017    Conditions to be addressed/monitored:  Diabetes and Heart Failure  Care Plan : [place]  Updates made by Lane Hacker, RPH since 08/04/2021 12:00 AM     Problem: dm, chf, hld   Priority: High  Onset Date: 01/10/2021     Long-Range Goal: Disease Management   Start Date: 01/10/2021  Expected End Date: 01/10/2022  Recent Progress: On track  Priority: High  Note:   Current Barriers:  Unable to maintain control of heart failure     Pharmacist Clinical Goal(s):  Patient will maintain control of heart failure as evidenced by symptom management and avoiding hospitalization  through collaboration with PharmD and provider.    Interventions: 1:1 collaboration with Cox, Kirsten, MD regarding development and update of comprehensive plan of care as evidenced by provider attestation and co-signature Inter-disciplinary care team collaboration (see longitudinal plan of care) Comprehensive medication review performed; medication list updated in electronic medical record   Diabetes (A1c goal 7.5%) -Controlled -Current medications: accu-chek fast clix lancets accu-chek smartview test strips -Medications previously tried: insulin   -Current home glucose readings fasting glucose: 74 mg/dL not taking meds -Denies hypoglycemic/hyperglycemic symptoms -Current meal patterns:  breakfast: daughter fixing breakfast eggs and grits lunch: sandwich or peanut butter carckers  dinner: family brings in meals - meat and vegetables snacks: peanut  butter crackers drinks: water -Current exercise: working with PT -Educated on A1c and blood sugar goals; Prevention and management of hypoglycemic episodes; Benefits of routine self-monitoring of blood sugar; -Counseled to check feet daily and get yearly eye exams -Counseled on diet and exercise extensively Recommended to continue current medication   Heart Failure (Goal: manage symptoms and prevent exacerbations) -Controlled -Last ejection fraction: 93-81% -HF type: Systolic -NYHA Class: II (slight limitation of activity) -Current treatment: amlodipine 5 mg daily  Bumetanide 1 mg daily Losartan 100 mg daily  -Medications previously tried: lasix, bumex, carvedilol, metoprolol, spironolactone -Current home BP/HR readings:  129-140/80 mmHg -Current dietary habits: children bringing in meals. States she is eating well.  -Current exercise habits: working with PT at home  -Educated on Benefits of medications for managing symptoms and prolonging life Importance of weighing daily; if you gain more than 3 pounds in one day or 5 pounds in one week,    Importance of blood pressure control -Counseled on diet and exercise extensively Recommended to continue current medication  AFib -Controlled -Current treatment: Xarelto 52m -Medications [facility] wouldn't pay since they already had Xarelto October 2022: Daughter told me she's giving Xarelto BID since they couldn't get Eliquis, I asked her to double check the SIG and she told me it says, "Take 1HS." Recommended she stop Xarelto BID and follow directions, I explained how she most likely confused the two blood thinners and counseled on S/S of bleeding   Patient Goals/Self-Care Activities Patient will:  - take medications as prescribed focus on  medication adherence by using pill box check glucose daily, document, and provide at future appointments check blood pressure  daily, document, and provide at future appointments   Follow Up Plan: Telephone follow up appointment with care management team member scheduled for: July 2023  [place] Constable, [place].D. - [phone]        Medication Assistance: None required.  Patient affirms current coverage meets needs.  Patient's preferred pharmacy is:  [id number] [place] [id number]: [phone]: [phone] [facility] [place]Phone: [phone]: [phone] Uses pill box? Yes Pt endorses good compliance  We discussed: Current pharmacy is preferred with insurance plan and patient is satisfied with pharmacy services Patient decided to: Continue current medication management strategy  Care Plan and Follow Up Patient Decision:  Patient agrees to Care Plan and Follow-up.  Plan: Telephone follow up appointment with care management team member scheduled for:  July 2023

## 2021-08-04 NOTE — Telephone Encounter (Signed)
I think this is very appropriate. Please call and approve. kc

## 2021-08-04 NOTE — Telephone Encounter (Signed)
Maryann Alar called from [facility] and stated [name]'s family is requesting her to be put on full hospice care as she is not doing well and  that this would give them moral support. Maryann Alar is asking are you okay with that before she does paperwork and transitions [name] to that?

## 2021-08-08 ENCOUNTER — Ambulatory Visit: Payer: Medicare HMO | Admitting: Family Medicine

## 2021-08-08 DIAGNOSIS — E782 Mixed hyperlipidemia: Secondary | ICD-10-CM

## 2021-08-08 DIAGNOSIS — N184 Chronic kidney disease, stage 4 (severe): Secondary | ICD-10-CM

## 2021-08-08 DIAGNOSIS — I5022 Chronic systolic (congestive) heart failure: Secondary | ICD-10-CM

## 2021-08-08 DIAGNOSIS — E1140 Type 2 diabetes mellitus with diabetic neuropathy, unspecified: Secondary | ICD-10-CM

## 2021-08-11 ENCOUNTER — Telehealth: Payer: Medicare HMO

## 2021-08-12 ENCOUNTER — Other Ambulatory Visit: Payer: Self-pay | Admitting: Family Medicine

## 2021-08-16 DIAGNOSIS — E1122 Type 2 diabetes mellitus with diabetic chronic kidney disease: Secondary | ICD-10-CM

## 2021-08-16 DIAGNOSIS — I130 Hypertensive heart and chronic kidney disease with heart failure and stage 1 through stage 4 chronic kidney disease, or unspecified chronic kidney disease: Secondary | ICD-10-CM

## 2021-08-16 DIAGNOSIS — I5023 Acute on chronic systolic (congestive) heart failure: Secondary | ICD-10-CM

## 2021-08-17 ENCOUNTER — Other Ambulatory Visit: Payer: Self-pay | Admitting: Family Medicine

## 2021-08-17 ENCOUNTER — Telehealth: Payer: Self-pay | Admitting: Family Medicine

## 2021-08-17 DIAGNOSIS — M1712 Unilateral primary osteoarthritis, left knee: Secondary | ICD-10-CM | POA: Diagnosis not present

## 2021-08-17 NOTE — Telephone Encounter (Signed)
I spoke with Suanne Marker, daughter, and hospice  Hospice provider is managing symptoms.  Pt apparently is eating well.  She is having delusions.  Family is giving her haldol for these.  Discussed purelink catheters (external) - not covered by insurance.

## 2021-08-18 DIAGNOSIS — E119 Type 2 diabetes mellitus without complications: Secondary | ICD-10-CM | POA: Diagnosis not present

## 2021-08-29 ENCOUNTER — Other Ambulatory Visit: Payer: Self-pay | Admitting: Family Medicine

## 2021-08-29 ENCOUNTER — Telehealth: Payer: Self-pay

## 2021-08-29 NOTE — Chronic Care Management (AMB) (Signed)
Chronic Care Management Pharmacy Assistant   Name: [name]  MRN: [id number] DOB: 05-Aug-1934   Reason for Encounter: General Adherence Call     Recent office visits:  08/17/21 Orders Only Cox, Kirsten MD. D/C Eliquis 2.5 mg.   Recent consult visits:  None   Hospital visits:  None   Medications: Outpatient Encounter Medications as of 08/29/2021  Medication Sig   Accu-Chek FastClix Lancets MISC TEST BLOOD SUGAR TWICE DAILY   ACCU-CHEK SMARTVIEW test strip CHECK BLOOD SUGAR TWICE DAILY (Patient taking differently: Qam)   Alcohol Swabs (DROPSAFE ALCOHOL PREP) 70 % PADS USE TWICE DAILY   allopurinol (ZYLOPRIM) 100 MG tablet Take 100 mg by mouth daily. (Patient not taking: Reported on 08/01/2021)   ALPRAZolam (XANAX) 0.5 MG tablet TAKE ONE TABLET BY MOUTH EVERY NIGHT AT BEDTIME FOR INSOMNIA   calcium carbonate (OSCAL) 1500 (600 Ca) MG TABS tablet Take 600 mg of elemental calcium by mouth daily with breakfast.   digoxin (LANOXIN) 0.125 MG tablet Take 0.5 tablets by mouth daily.   donepezil (ARICEPT) 5 MG tablet Take 1 tablet (5 mg total) by mouth at bedtime.   DULoxetine (CYMBALTA) 20 MG capsule TAKE 1 CAPSULE BY MOUTH EVERYDAY AT BEDTIME   ezetimibe (ZETIA) 10 MG tablet Take 10 mg by mouth daily.   FARXIGA 10 MG TABS tablet TAKE 1 TABLET BY MOUTH EVERY DAY   fenofibrate micronized (LOFIBRA) 134 MG capsule TAKE 1 CAPSULE (134 MG TOTAL) BY MOUTH DAILY. (Patient not taking: Reported on 08/01/2021)   fluticasone (FLONASE) 50 MCG/ACT nasal spray Place 2 sprays into both nostrils daily as needed for allergies or rhinitis.   furosemide (LASIX) 40 MG tablet TAKE 1 TABLET BY MOUTH AT 0800 AND 1600   isosorbide mononitrate (IMDUR) 30 MG 24 hr tablet Take 1 tablet (30 mg total) by mouth daily.   levothyroxine (SYNTHROID) 50 MCG tablet Take 50 mcg by mouth daily before breakfast.   Multiple Vitamin (MULTIVITAMIN WITH MINERALS) TABS tablet Take 1 tablet by mouth daily.  Centrum Silver   Naphazoline-Pheniramine (OPCON-A) 0.027-0.315 % SOLN Apply 1 drop to eye in the morning and at bedtime.   nystatin cream (MYCOSTATIN) Apply 1 application topically 3 (three) times daily. Cream to back rash   olopatadine (PATANOL) 0.1 % ophthalmic solution Place 1 drop into both eyes 2 (two) times daily.   omega-3 acid ethyl esters (LOVAZA) 1 g capsule Take 1 g by mouth 2 (two) times daily.   omeprazole (PRILOSEC) 40 MG capsule Take 1 capsule (40 mg total) by mouth daily.   oxyCODONE-acetaminophen (PERCOCET) 7.5-325 MG tablet Take 1 tablet by mouth every 6 (six) hours as needed (back pain.).   polyethylene glycol (MIRALAX / GLYCOLAX) 17 g packet Take 17 g by mouth daily as needed for constipation.   Potassium Chloride ER 20 MEQ TBCR Take 20 mEq by mouth daily.   pravastatin (PRAVACHOL) 10 MG tablet Take 10 mg by mouth 4 (four) times a week.   rOPINIRole (REQUIP) 1 MG tablet Take 1 mg by mouth at bedtime.   senna (SENOKOT) 8.6 MG TABS tablet Take 2 tablets by mouth daily. (Patient not taking: Reported on 08/01/2021)   tiZANidine (ZANAFLEX) 4 MG tablet Take 4 mg by mouth at bedtime.   XARELTO 15 MG TABS tablet TAKE 1 TABLET EVERY DAY WITH SUPPER   No facility-administered encounter medications on file as of 08/29/2021.   [place] for general disease state and medication adherence call.  Patient is not > 5 days past due for refill on the following medications per chart history:  [facility] Medications: Medication Name/mg Last Fill Days Supply Farxiga    07/20/21 30ds Pravastatin    06/02/21 84 ds Losartan    07/03/21 90ds  What concerns do you have about your medications?  The patient denies side effects with her medications.   How often do you forget or accidentally miss a dose?   Do you use a pillbox?   Are you having any problems getting your medications from your pharmacy?   Has the cost of your medications been a concern?  If yes, what  medication and is patient assistance available or has it been applied for?.   3 attempts were made to contact pt and all were unsuccessful    Care Gaps: Last annual wellness visit? 06/16/25 If applicable: Last eye exam / retinopathy screening? Never done  Diabetic foot exam? 07/21/21    Elray Mcgregor, [facility] Pharmacist Assistant  [phone]

## 2021-08-30 ENCOUNTER — Ambulatory Visit: Payer: Medicare HMO | Admitting: Family Medicine

## 2021-09-08 DEATH — deceased

## 2021-09-20 ENCOUNTER — Telehealth: Payer: Medicare HMO
